# Patient Record
Sex: Male | Born: 1950 | Race: Black or African American | Hispanic: No | Marital: Married | Smoking: Never smoker
Health system: Southern US, Community
[De-identification: ages and names within clinical notes are randomized; demographics above are authoritative.]

## PROBLEM LIST (undated history)

## (undated) DIAGNOSIS — E559 Vitamin D deficiency, unspecified: Secondary | ICD-10-CM

## (undated) DIAGNOSIS — K219 Gastro-esophageal reflux disease without esophagitis: Secondary | ICD-10-CM

## (undated) DIAGNOSIS — R269 Unspecified abnormalities of gait and mobility: Secondary | ICD-10-CM

## (undated) DIAGNOSIS — G629 Polyneuropathy, unspecified: Secondary | ICD-10-CM

## (undated) DIAGNOSIS — F329 Major depressive disorder, single episode, unspecified: Secondary | ICD-10-CM

## (undated) DIAGNOSIS — L603 Nail dystrophy: Secondary | ICD-10-CM

## (undated) DIAGNOSIS — M1990 Unspecified osteoarthritis, unspecified site: Secondary | ICD-10-CM

## (undated) DIAGNOSIS — R29898 Other symptoms and signs involving the musculoskeletal system: Secondary | ICD-10-CM

## (undated) DIAGNOSIS — E039 Hypothyroidism, unspecified: Secondary | ICD-10-CM

## (undated) DIAGNOSIS — G5601 Carpal tunnel syndrome, right upper limb: Secondary | ICD-10-CM

## (undated) DIAGNOSIS — M5030 Other cervical disc degeneration, unspecified cervical region: Secondary | ICD-10-CM

## (undated) DIAGNOSIS — J449 Chronic obstructive pulmonary disease, unspecified: Secondary | ICD-10-CM

## (undated) DIAGNOSIS — M25541 Pain in joints of right hand: Secondary | ICD-10-CM

## (undated) DIAGNOSIS — I2510 Atherosclerotic heart disease of native coronary artery without angina pectoris: Secondary | ICD-10-CM

## (undated) DIAGNOSIS — E237 Disorder of pituitary gland, unspecified: Secondary | ICD-10-CM

## (undated) DIAGNOSIS — E785 Hyperlipidemia, unspecified: Secondary | ICD-10-CM

## (undated) DIAGNOSIS — G4730 Sleep apnea, unspecified: Secondary | ICD-10-CM

## (undated) DIAGNOSIS — Q761 Klippel-Feil syndrome: Secondary | ICD-10-CM

## (undated) DIAGNOSIS — T8859XA Other complications of anesthesia, initial encounter: Secondary | ICD-10-CM

## (undated) DIAGNOSIS — Z96659 Presence of unspecified artificial knee joint: Secondary | ICD-10-CM

## (undated) DIAGNOSIS — Z973 Presence of spectacles and contact lenses: Secondary | ICD-10-CM

## (undated) DIAGNOSIS — R1310 Dysphagia, unspecified: Secondary | ICD-10-CM

## (undated) DIAGNOSIS — I872 Venous insufficiency (chronic) (peripheral): Secondary | ICD-10-CM

## (undated) DIAGNOSIS — R0609 Other forms of dyspnea: Secondary | ICD-10-CM

## (undated) DIAGNOSIS — F32A Depression, unspecified: Secondary | ICD-10-CM

## (undated) DIAGNOSIS — F99 Mental disorder, not otherwise specified: Secondary | ICD-10-CM

## (undated) DIAGNOSIS — K4090 Unilateral inguinal hernia, without obstruction or gangrene, not specified as recurrent: Secondary | ICD-10-CM

## (undated) DIAGNOSIS — G4733 Obstructive sleep apnea (adult) (pediatric): Secondary | ICD-10-CM

## (undated) DIAGNOSIS — M47816 Spondylosis without myelopathy or radiculopathy, lumbar region: Secondary | ICD-10-CM

## (undated) DIAGNOSIS — I10 Essential (primary) hypertension: Secondary | ICD-10-CM

## (undated) DIAGNOSIS — F419 Anxiety disorder, unspecified: Secondary | ICD-10-CM

## (undated) DIAGNOSIS — F4310 Post-traumatic stress disorder, unspecified: Secondary | ICD-10-CM

## (undated) DIAGNOSIS — F411 Generalized anxiety disorder: Secondary | ICD-10-CM

## (undated) DIAGNOSIS — R9431 Abnormal electrocardiogram [ECG] [EKG]: Secondary | ICD-10-CM

## (undated) DIAGNOSIS — J309 Allergic rhinitis, unspecified: Secondary | ICD-10-CM

## (undated) DIAGNOSIS — N189 Chronic kidney disease, unspecified: Secondary | ICD-10-CM

## (undated) HISTORY — DX: Vitamin D deficiency, unspecified: E55.9

## (undated) HISTORY — DX: Unspecified osteoarthritis, unspecified site: M19.90

## (undated) HISTORY — DX: Essential (primary) hypertension: I10

## (undated) HISTORY — DX: Other cervical disc degeneration, unspecified cervical region: M50.30

## (undated) HISTORY — DX: Chronic obstructive pulmonary disease, unspecified: J44.9

## (undated) HISTORY — DX: Generalized anxiety disorder: F41.1

## (undated) HISTORY — PX: SHOULDER SURGERY: SHX246

## (undated) HISTORY — DX: Nail dystrophy: L60.3

## (undated) HISTORY — DX: Klippel-Feil syndrome: Q76.1

## (undated) HISTORY — DX: Hyperlipidemia, unspecified: E78.5

## (undated) HISTORY — DX: Pain in joints of right hand: M25.541

## (undated) HISTORY — DX: Major depressive disorder, single episode, unspecified: F32.9

## (undated) HISTORY — DX: Allergic rhinitis, unspecified: J30.9

## (undated) HISTORY — PX: HIP ARTHROPLASTY: SHX981

## (undated) HISTORY — PX: UPPER GI ENDOSCOPY: SHX6162

## (undated) HISTORY — DX: Spondylosis without myelopathy or radiculopathy, lumbar region: M47.816

## (undated) HISTORY — PX: COLONOSCOPY: SHX174

## (undated) HISTORY — PX: CARDIAC CATHETERIZATION: SHX172

## (undated) HISTORY — DX: Abnormal electrocardiogram (ECG) (EKG): R94.31

## (undated) HISTORY — PX: CERVICAL DISC SURGERY: SHX588

## (undated) HISTORY — DX: Other forms of dyspnea: R06.09

## (undated) HISTORY — PX: KNEE ARTHROPLASTY: SHX992

## (undated) HISTORY — DX: Unspecified abnormalities of gait and mobility: R26.9

## (undated) HISTORY — PX: JOINT REPLACEMENT: SHX530

## (undated) HISTORY — DX: Presence of unspecified artificial knee joint: Z96.659

## (undated) HISTORY — DX: Carpal tunnel syndrome, right upper limb: G56.01

## (undated) HISTORY — DX: Venous insufficiency (chronic) (peripheral): I87.2

---

## 1995-08-17 DIAGNOSIS — F819 Developmental disorder of scholastic skills, unspecified: Secondary | ICD-10-CM

## 1995-08-17 HISTORY — DX: Developmental disorder of scholastic skills, unspecified: F81.9

## 1997-12-16 ENCOUNTER — Ambulatory Visit (HOSPITAL_COMMUNITY): Admission: RE | Admit: 1997-12-16 | Discharge: 1997-12-16 | Payer: Self-pay | Admitting: Internal Medicine

## 1999-03-03 ENCOUNTER — Ambulatory Visit (HOSPITAL_BASED_OUTPATIENT_CLINIC_OR_DEPARTMENT_OTHER): Admission: RE | Admit: 1999-03-03 | Discharge: 1999-03-03 | Payer: Self-pay

## 1999-05-05 ENCOUNTER — Ambulatory Visit (HOSPITAL_BASED_OUTPATIENT_CLINIC_OR_DEPARTMENT_OTHER): Admission: RE | Admit: 1999-05-05 | Discharge: 1999-05-05 | Payer: Self-pay | Admitting: Orthopedic Surgery

## 1999-11-30 ENCOUNTER — Encounter: Payer: Self-pay | Admitting: Orthopedic Surgery

## 1999-12-01 ENCOUNTER — Inpatient Hospital Stay (HOSPITAL_COMMUNITY): Admission: RE | Admit: 1999-12-01 | Discharge: 1999-12-04 | Payer: Self-pay | Admitting: Orthopedic Surgery

## 2000-01-01 ENCOUNTER — Encounter: Admission: RE | Admit: 2000-01-01 | Discharge: 2000-01-28 | Payer: Self-pay | Admitting: Orthopedic Surgery

## 2000-01-15 ENCOUNTER — Ambulatory Visit: Admission: RE | Admit: 2000-01-15 | Discharge: 2000-01-15 | Payer: Self-pay | Admitting: Internal Medicine

## 2000-08-04 ENCOUNTER — Inpatient Hospital Stay (HOSPITAL_COMMUNITY): Admission: AD | Admit: 2000-08-04 | Discharge: 2000-08-07 | Payer: Self-pay | Admitting: Internal Medicine

## 2000-08-06 ENCOUNTER — Encounter: Payer: Self-pay | Admitting: Internal Medicine

## 2000-12-06 ENCOUNTER — Encounter: Admission: RE | Admit: 2000-12-06 | Discharge: 2000-12-06 | Payer: Self-pay | Admitting: Internal Medicine

## 2000-12-06 ENCOUNTER — Encounter: Payer: Self-pay | Admitting: Internal Medicine

## 2002-07-27 ENCOUNTER — Encounter: Payer: Self-pay | Admitting: Internal Medicine

## 2002-07-27 ENCOUNTER — Encounter: Admission: RE | Admit: 2002-07-27 | Discharge: 2002-07-27 | Payer: Self-pay | Admitting: Internal Medicine

## 2002-10-08 ENCOUNTER — Encounter: Payer: Self-pay | Admitting: Internal Medicine

## 2002-10-08 ENCOUNTER — Encounter: Admission: RE | Admit: 2002-10-08 | Discharge: 2002-10-08 | Payer: Self-pay | Admitting: Internal Medicine

## 2003-09-18 ENCOUNTER — Inpatient Hospital Stay (HOSPITAL_COMMUNITY): Admission: AD | Admit: 2003-09-18 | Discharge: 2003-09-19 | Payer: Self-pay | Admitting: Internal Medicine

## 2003-10-09 ENCOUNTER — Encounter: Admission: RE | Admit: 2003-10-09 | Discharge: 2003-10-09 | Payer: Self-pay | Admitting: Internal Medicine

## 2003-10-09 ENCOUNTER — Emergency Department (HOSPITAL_COMMUNITY): Admission: EM | Admit: 2003-10-09 | Discharge: 2003-10-09 | Payer: Self-pay | Admitting: Emergency Medicine

## 2003-10-10 ENCOUNTER — Ambulatory Visit (HOSPITAL_BASED_OUTPATIENT_CLINIC_OR_DEPARTMENT_OTHER): Admission: RE | Admit: 2003-10-10 | Discharge: 2003-10-10 | Payer: Self-pay | Admitting: Internal Medicine

## 2003-12-12 ENCOUNTER — Ambulatory Visit (HOSPITAL_COMMUNITY): Admission: RE | Admit: 2003-12-12 | Discharge: 2003-12-13 | Payer: Self-pay | Admitting: Neurosurgery

## 2004-08-16 HISTORY — PX: CARDIAC CATHETERIZATION: SHX172

## 2004-09-25 ENCOUNTER — Inpatient Hospital Stay (HOSPITAL_COMMUNITY): Admission: EM | Admit: 2004-09-25 | Discharge: 2004-09-29 | Payer: Self-pay | Admitting: Emergency Medicine

## 2004-09-28 ENCOUNTER — Encounter (INDEPENDENT_AMBULATORY_CARE_PROVIDER_SITE_OTHER): Payer: Self-pay | Admitting: Cardiology

## 2004-10-10 ENCOUNTER — Emergency Department (HOSPITAL_COMMUNITY): Admission: EM | Admit: 2004-10-10 | Discharge: 2004-10-10 | Payer: Self-pay | Admitting: Emergency Medicine

## 2004-10-14 ENCOUNTER — Inpatient Hospital Stay (HOSPITAL_COMMUNITY): Admission: AD | Admit: 2004-10-14 | Discharge: 2004-10-16 | Payer: Self-pay | Admitting: Internal Medicine

## 2004-10-26 ENCOUNTER — Inpatient Hospital Stay (HOSPITAL_COMMUNITY): Admission: RE | Admit: 2004-10-26 | Discharge: 2004-10-27 | Payer: Self-pay | Admitting: Neurosurgery

## 2005-04-01 ENCOUNTER — Emergency Department (HOSPITAL_COMMUNITY): Admission: EM | Admit: 2005-04-01 | Discharge: 2005-04-02 | Payer: Self-pay | Admitting: Emergency Medicine

## 2006-02-28 ENCOUNTER — Encounter: Payer: Self-pay | Admitting: Pulmonary Disease

## 2006-03-27 IMAGING — CT CT HEAD W/O CM
1 of 2 series · 13 of 30 positions shown, 17 images · IV contrast (agent unspecified)
Comparison: 09/18/03.

CLINICAL DATA: Syncope. Patient fell, but reportedly did not strike his head.
 CT HEAD WITHOUT CONTRAST:
 Routine unenhanced study was performed.

[Series 2: brain · axial · 0.47mm/px · z∈[+159,+283]mm · 13 of 28 slices shown, 17 images]
[im 2/28  brain]
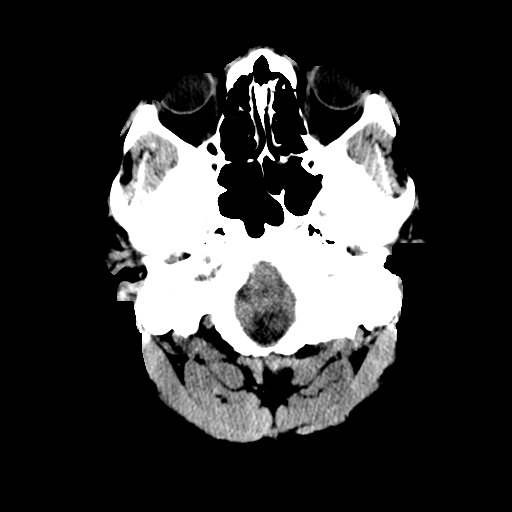
[im 2/28  bone]
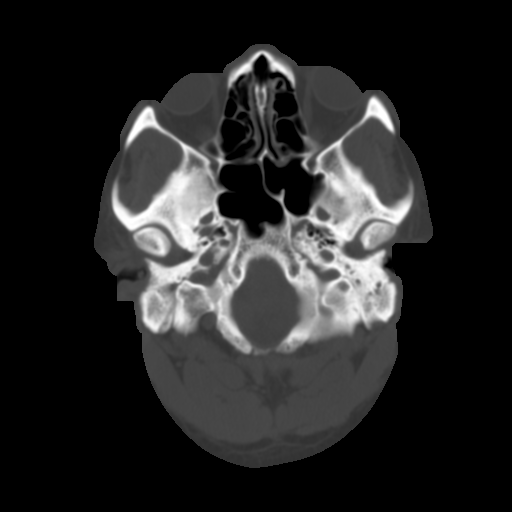
[im 4/28  brain]
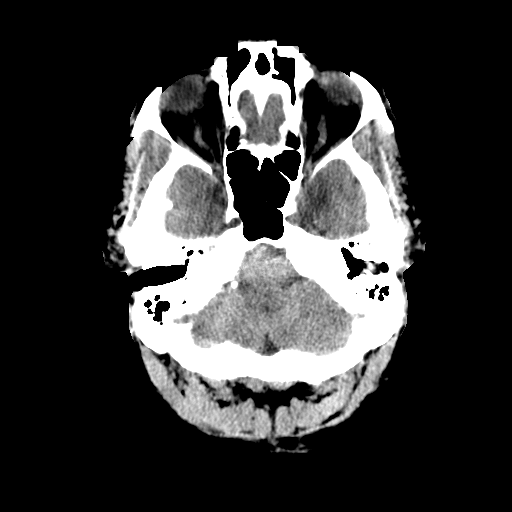
[im 6/28  brain]
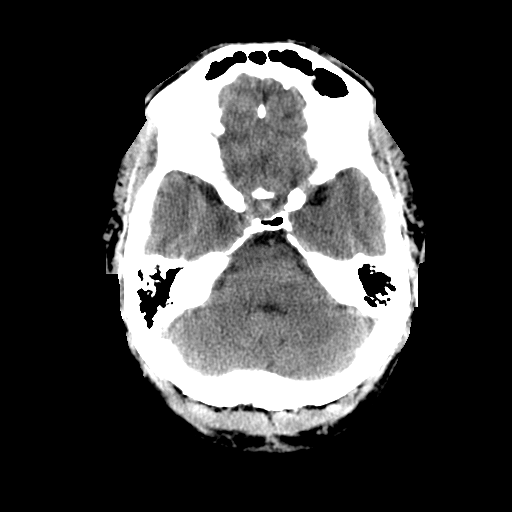
[im 8/28  brain]
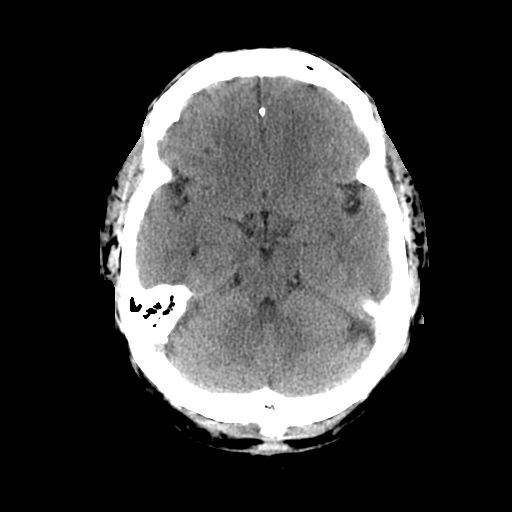
[im 10/28  brain]
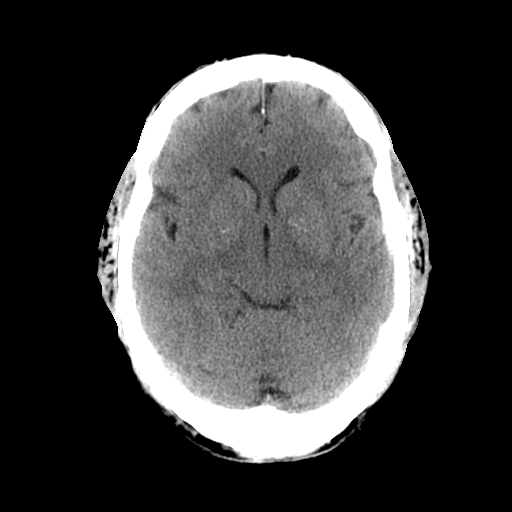
[im 10/28  bone]
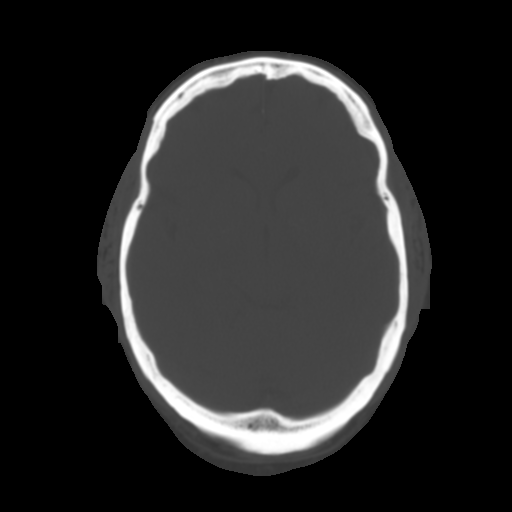
[im 12/28  brain]
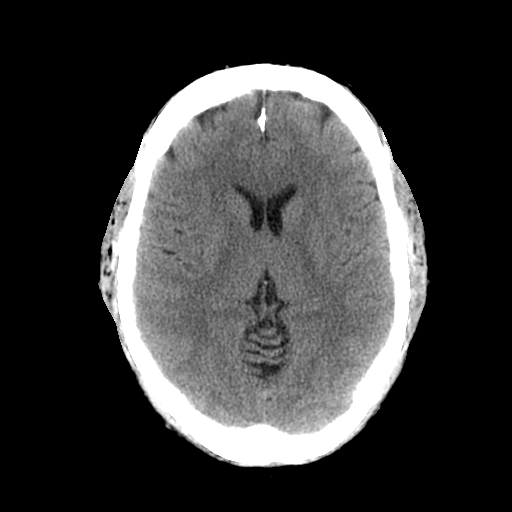
[im 14/28  brain]
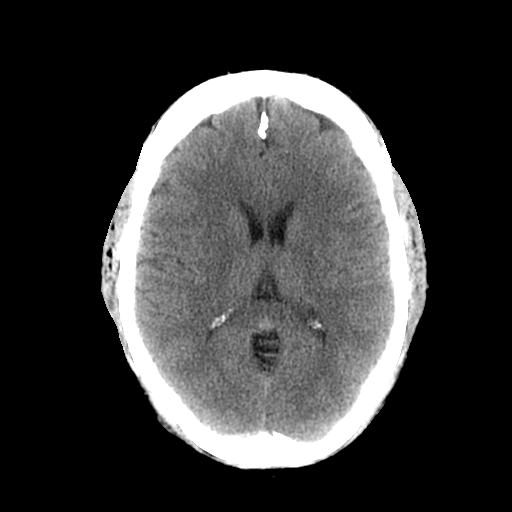
[im 16/28  brain]
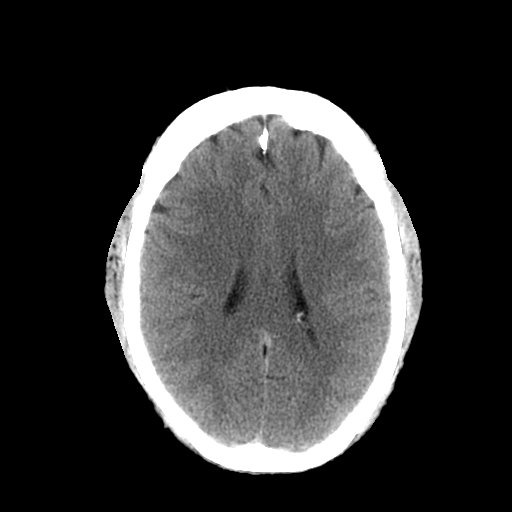
[im 18/28  brain]
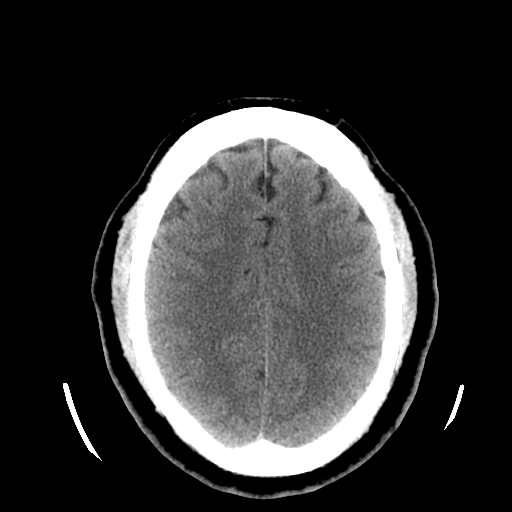
[im 18/28  bone]
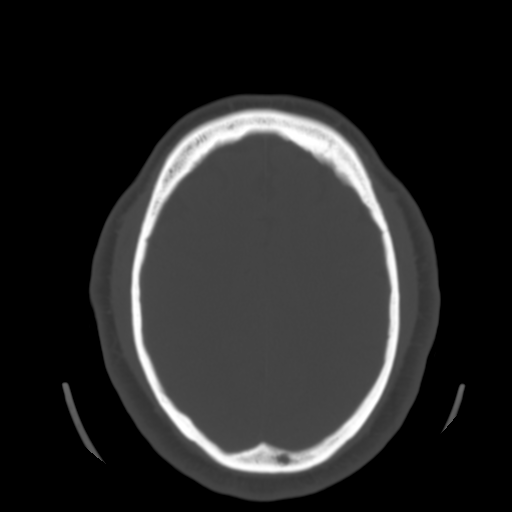
[im 20/28  brain]
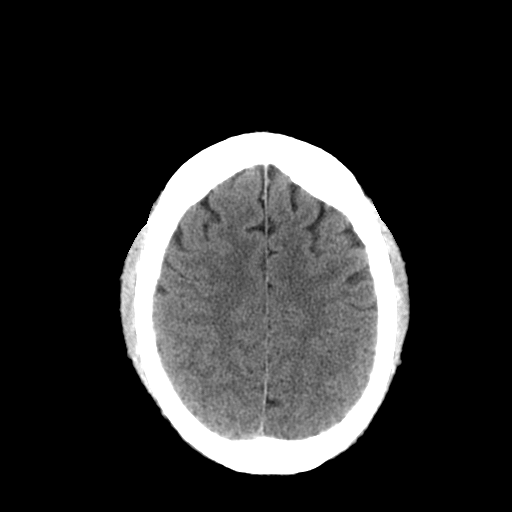
[im 22/28  brain]
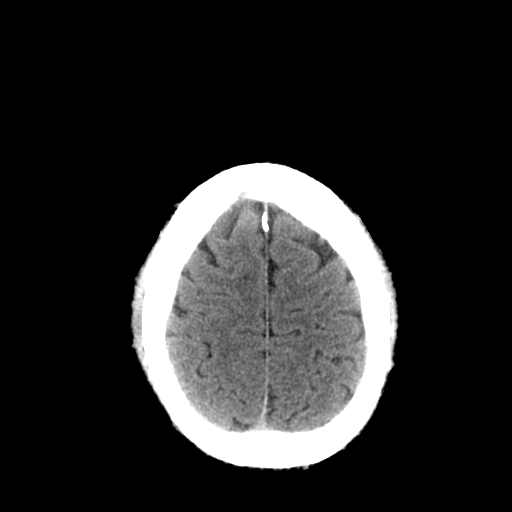
[im 24/28  brain]
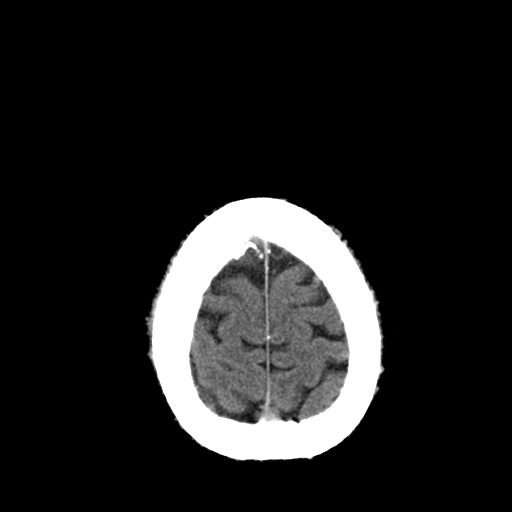
[im 26/28  brain]
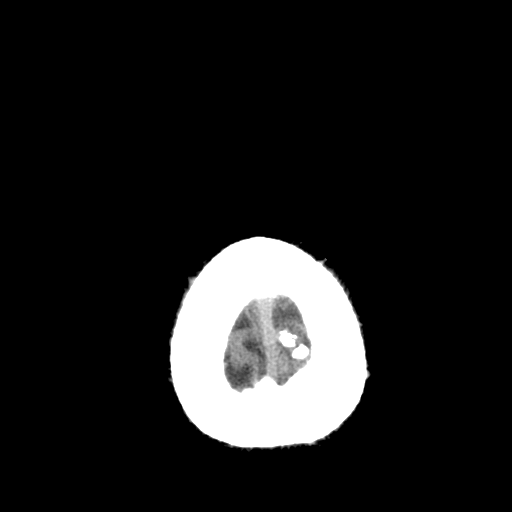
[im 26/28  bone]
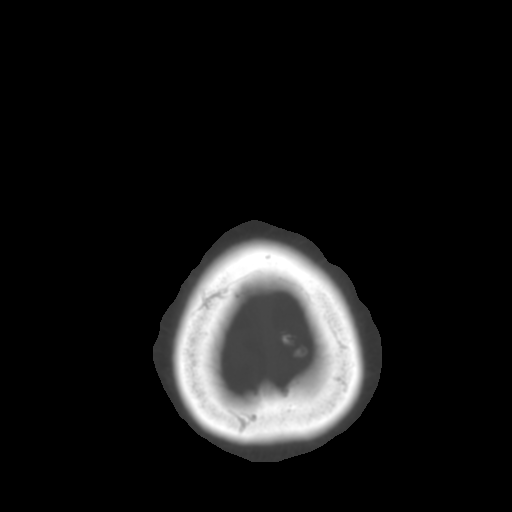

[13 of 30 positions shown; findings below may reference images not displayed]

There is no evidence of acute intracranial hemorrhage, mass effect, or extra-axial fluid collection. The ventricles and subarachnoid spaces remain appropriately sized for age.  Scattered calvarial lucencies are stable.  The visualized paranasal sinuses are clear. Soft tissue density in the left external auditory canal is similar to that seen previously and probably due to cerumen.
IMPRESSION: Stable exam. No acute intracranial findings.

## 2006-06-20 ENCOUNTER — Encounter: Payer: Self-pay | Admitting: Pulmonary Disease

## 2007-10-02 ENCOUNTER — Emergency Department (HOSPITAL_COMMUNITY): Admission: EM | Admit: 2007-10-02 | Discharge: 2007-10-03 | Payer: Self-pay | Admitting: Emergency Medicine

## 2007-10-13 ENCOUNTER — Emergency Department (HOSPITAL_COMMUNITY): Admission: EM | Admit: 2007-10-13 | Discharge: 2007-10-13 | Payer: Self-pay | Admitting: Family Medicine

## 2007-11-10 ENCOUNTER — Encounter: Admission: RE | Admit: 2007-11-10 | Discharge: 2007-11-10 | Payer: Self-pay | Admitting: Orthopedic Surgery

## 2008-08-06 ENCOUNTER — Inpatient Hospital Stay (HOSPITAL_COMMUNITY): Admission: RE | Admit: 2008-08-06 | Discharge: 2008-08-09 | Payer: Self-pay | Admitting: Orthopedic Surgery

## 2010-07-18 ENCOUNTER — Emergency Department (HOSPITAL_COMMUNITY)
Admission: EM | Admit: 2010-07-18 | Discharge: 2010-07-18 | Payer: Self-pay | Source: Home / Self Care | Admitting: Emergency Medicine

## 2010-07-18 ENCOUNTER — Emergency Department (HOSPITAL_COMMUNITY)
Admission: EM | Admit: 2010-07-18 | Discharge: 2010-07-18 | Disposition: A | Discharge status: Other Acute Inpatient Hospital | Payer: Self-pay | Source: Home / Self Care | Admitting: Emergency Medicine

## 2010-10-23 ENCOUNTER — Other Ambulatory Visit: Payer: Self-pay | Admitting: Orthopedic Surgery

## 2010-10-23 ENCOUNTER — Other Ambulatory Visit (HOSPITAL_COMMUNITY): Payer: Self-pay | Admitting: Orthopedic Surgery

## 2010-10-23 ENCOUNTER — Encounter (HOSPITAL_COMMUNITY): Payer: Medicare Other

## 2010-10-23 ENCOUNTER — Ambulatory Visit (HOSPITAL_COMMUNITY)
Admission: RE | Admit: 2010-10-23 | Discharge: 2010-10-23 | Disposition: A | Discharge status: Home / Self Care | Payer: Medicare Other | Source: Ambulatory Visit | Attending: Orthopedic Surgery | Admitting: Orthopedic Surgery

## 2010-10-23 DIAGNOSIS — Z01818 Encounter for other preprocedural examination: Secondary | ICD-10-CM | POA: Insufficient documentation

## 2010-10-23 DIAGNOSIS — M1710 Unilateral primary osteoarthritis, unspecified knee: Secondary | ICD-10-CM | POA: Insufficient documentation

## 2010-10-23 DIAGNOSIS — M47814 Spondylosis without myelopathy or radiculopathy, thoracic region: Secondary | ICD-10-CM | POA: Insufficient documentation

## 2010-10-23 DIAGNOSIS — Z01811 Encounter for preprocedural respiratory examination: Secondary | ICD-10-CM | POA: Insufficient documentation

## 2010-10-23 DIAGNOSIS — Z01812 Encounter for preprocedural laboratory examination: Secondary | ICD-10-CM | POA: Insufficient documentation

## 2010-10-23 LAB — DIFFERENTIAL
Basophils Absolute: 0 10*3/uL (ref 0.0–0.1)
Lymphocytes Relative: 32 % (ref 12–46)
Lymphs Abs: 1.9 10*3/uL (ref 0.7–4.0)
Monocytes Absolute: 0.6 10*3/uL (ref 0.1–1.0)
Neutro Abs: 3.2 10*3/uL (ref 1.7–7.7)

## 2010-10-23 LAB — URINALYSIS, ROUTINE W REFLEX MICROSCOPIC
Glucose, UA: NEGATIVE mg/dL
Hgb urine dipstick: NEGATIVE
Specific Gravity, Urine: 1.015 (ref 1.005–1.030)
pH: 6.5 (ref 5.0–8.0)

## 2010-10-23 LAB — CBC
HCT: 41.7 % (ref 39.0–52.0)
Hemoglobin: 13 g/dL (ref 13.0–17.0)
MCV: 74.3 fL — ABNORMAL LOW (ref 78.0–100.0)
RBC: 5.61 MIL/uL (ref 4.22–5.81)
RDW: 15.3 % (ref 11.5–15.5)
WBC: 5.9 10*3/uL (ref 4.0–10.5)

## 2010-10-23 LAB — COMPREHENSIVE METABOLIC PANEL
ALT: 21 U/L (ref 0–53)
AST: 25 U/L (ref 0–37)
Albumin: 3.9 g/dL (ref 3.5–5.2)
Calcium: 9.5 mg/dL (ref 8.4–10.5)
Chloride: 101 mEq/L (ref 96–112)
Creatinine, Ser: 1.22 mg/dL (ref 0.4–1.5)
GFR calc Af Amer: >60 mL/min (ref >60)
Sodium: 138 mEq/L (ref 135–145)
Total Bilirubin: 0.8 mg/dL (ref 0.3–1.2)

## 2010-10-23 LAB — APTT: aPTT: 29 seconds (ref 24–37)

## 2010-10-23 LAB — SURGICAL PCR SCREEN
MRSA, PCR: NEGATIVE
Staphylococcus aureus: POSITIVE — AB

## 2010-10-26 LAB — POCT I-STAT, CHEM 8
BUN: 17 mg/dL (ref 6–23)
Calcium, Ion: 1.2 mmol/L (ref 1.12–1.32)
HCT: 46 % (ref 39.0–52.0)
TCO2: 30 mmol/L (ref 0–100)

## 2010-10-26 LAB — CBC
MCV: 75 fL — ABNORMAL LOW (ref 78.0–100.0)
Platelets: 262 10*3/uL (ref 150–400)
RDW: 16 % — ABNORMAL HIGH (ref 11.5–15.5)
WBC: 7.5 10*3/uL (ref 4.0–10.5)

## 2010-10-26 LAB — DIFFERENTIAL
Basophils Absolute: 0 10*3/uL (ref 0.0–0.1)
Basophils Relative: 0 % (ref 0–1)
Eosinophils Absolute: 0.1 10*3/uL (ref 0.0–0.7)
Eosinophils Relative: 1 % (ref 0–5)
Lymphocytes Relative: 23 % (ref 12–46)

## 2010-10-29 ENCOUNTER — Inpatient Hospital Stay (HOSPITAL_COMMUNITY)
Admission: RE | Admit: 2010-10-29 | Discharge: 2010-11-02 | DRG: 470 | Disposition: A | Discharge status: Skilled Nursing Facility | Payer: Medicare Other | Source: Ambulatory Visit | Attending: Orthopedic Surgery | Admitting: Orthopedic Surgery

## 2010-10-29 DIAGNOSIS — D352 Benign neoplasm of pituitary gland: Secondary | ICD-10-CM | POA: Diagnosis present

## 2010-10-29 DIAGNOSIS — J9819 Other pulmonary collapse: Secondary | ICD-10-CM | POA: Diagnosis not present

## 2010-10-29 DIAGNOSIS — I10 Essential (primary) hypertension: Secondary | ICD-10-CM | POA: Diagnosis present

## 2010-10-29 DIAGNOSIS — D62 Acute posthemorrhagic anemia: Secondary | ICD-10-CM | POA: Diagnosis not present

## 2010-10-29 DIAGNOSIS — E7800 Pure hypercholesterolemia, unspecified: Secondary | ICD-10-CM | POA: Diagnosis present

## 2010-10-29 DIAGNOSIS — K5900 Constipation, unspecified: Secondary | ICD-10-CM | POA: Diagnosis not present

## 2010-10-29 DIAGNOSIS — E119 Type 2 diabetes mellitus without complications: Secondary | ICD-10-CM | POA: Diagnosis present

## 2010-10-29 DIAGNOSIS — G4733 Obstructive sleep apnea (adult) (pediatric): Secondary | ICD-10-CM | POA: Diagnosis present

## 2010-10-29 DIAGNOSIS — G47419 Narcolepsy without cataplexy: Secondary | ICD-10-CM | POA: Diagnosis present

## 2010-10-29 DIAGNOSIS — Z96659 Presence of unspecified artificial knee joint: Secondary | ICD-10-CM

## 2010-10-29 DIAGNOSIS — E039 Hypothyroidism, unspecified: Secondary | ICD-10-CM | POA: Diagnosis present

## 2010-10-29 DIAGNOSIS — F341 Dysthymic disorder: Secondary | ICD-10-CM | POA: Diagnosis present

## 2010-10-29 DIAGNOSIS — E876 Hypokalemia: Secondary | ICD-10-CM | POA: Diagnosis not present

## 2010-10-29 DIAGNOSIS — Z96649 Presence of unspecified artificial hip joint: Secondary | ICD-10-CM

## 2010-10-29 DIAGNOSIS — M1710 Unilateral primary osteoarthritis, unspecified knee: Principal | ICD-10-CM | POA: Diagnosis present

## 2010-10-29 LAB — GLUCOSE, CAPILLARY
Glucose-Capillary: 121 mg/dL — ABNORMAL HIGH (ref 70–99)
Glucose-Capillary: 126 mg/dL — ABNORMAL HIGH (ref 70–99)
Glucose-Capillary: 43 mg/dL — CL (ref 70–99)

## 2010-10-29 LAB — ABO/RH: ABO/RH(D): O POS

## 2010-10-29 LAB — CROSSMATCH

## 2010-10-30 LAB — CBC
HCT: 33.9 % — ABNORMAL LOW (ref 39.0–52.0)
Hemoglobin: 10.6 g/dL — ABNORMAL LOW (ref 13.0–17.0)
MCH: 23.6 pg — ABNORMAL LOW (ref 26.0–34.0)
MCHC: 31.3 g/dL (ref 30.0–36.0)
RBC: 4.5 MIL/uL (ref 4.22–5.81)

## 2010-10-30 LAB — BASIC METABOLIC PANEL
CO2: 29 mEq/L (ref 19–32)
Calcium: 8.3 mg/dL — ABNORMAL LOW (ref 8.4–10.5)
Chloride: 102 mEq/L (ref 96–112)
Creatinine, Ser: 1.21 mg/dL (ref 0.4–1.5)
GFR calc Af Amer: >60 mL/min (ref >60)
Glucose, Bld: 124 mg/dL — ABNORMAL HIGH (ref 70–99)

## 2010-10-30 LAB — GLUCOSE, CAPILLARY
Glucose-Capillary: 93 mg/dL (ref 70–99)
Glucose-Capillary: 115 mg/dL — ABNORMAL HIGH (ref 70–99)

## 2010-10-31 LAB — BASIC METABOLIC PANEL
CO2: 28 mEq/L (ref 19–32)
Chloride: 103 mEq/L (ref 96–112)
GFR calc Af Amer: >60 mL/min (ref >60)
Glucose, Bld: 109 mg/dL — ABNORMAL HIGH (ref 70–99)
Sodium: 137 mEq/L (ref 135–145)

## 2010-10-31 LAB — GLUCOSE, CAPILLARY
Glucose-Capillary: 87 mg/dL (ref 70–99)
Glucose-Capillary: 84 mg/dL (ref 70–99)
Glucose-Capillary: 104 mg/dL — ABNORMAL HIGH (ref 70–99)

## 2010-10-31 LAB — CBC
HCT: 31.9 % — ABNORMAL LOW (ref 39.0–52.0)
Hemoglobin: 9.9 g/dL — ABNORMAL LOW (ref 13.0–17.0)
RBC: 4.27 MIL/uL (ref 4.22–5.81)

## 2010-11-01 LAB — GLUCOSE, CAPILLARY
Glucose-Capillary: 120 mg/dL — ABNORMAL HIGH (ref 70–99)
Glucose-Capillary: 93 mg/dL (ref 70–99)
Glucose-Capillary: 112 mg/dL — ABNORMAL HIGH (ref 70–99)

## 2010-11-01 LAB — CBC
MCHC: 30.9 g/dL (ref 30.0–36.0)
Platelets: 212 10*3/uL (ref 150–400)
RDW: 15.1 % (ref 11.5–15.5)
WBC: 9.5 10*3/uL (ref 4.0–10.5)

## 2010-11-01 LAB — BASIC METABOLIC PANEL
Calcium: 8.5 mg/dL (ref 8.4–10.5)
GFR calc Af Amer: >60 mL/min (ref >60)
GFR calc non Af Amer: >60 mL/min (ref >60)
Glucose, Bld: 103 mg/dL — ABNORMAL HIGH (ref 70–99)
Sodium: 137 mEq/L (ref 135–145)

## 2010-11-17 NOTE — Op Note (Signed)
[name], [name]              ACCOUNT NO.:  [id number]  MEDICAL RECORD NO.:  [id number]           PATIENT TYPE:  I  LOCATION:  0004                         FACILITY:  [facility]  PHYSICIAN:  Demiya Magno L. Rendall, M.D.  DATE OF BIRTH:  [birth year]  DATE OF PROCEDURE:  10/29/2010 DATE OF DISCHARGE:                              OPERATIVE REPORT   PREOPERATIVE DIAGNOSIS:  Osteoarthritis, right knee with angular deformity.  POSTOPERATIVE DIAGNOSIS:  Osteoarthritis, right knee with angular deformity.  SURGICAL PROCEDURES:  Right low contact stress total knee arthroplasty with computer navigation assistance.  SURGEON:  Tywone Bembenek L. Rendall, MD  ASSISTANT:  Legrand Pitts. Duffy, PAC - present and participating in entire procedure.  ANESTHESIA:  General with femoral nerve block.  PATHOLOGY:  The patient has fixed varus deformity of 10 degrees, 12 degrees fixed flexion contracture.  The knee has gross crepitants.  It has end-stage arthritis, worse medially with at least 8 loose bodies that were removed from the knee.  PROCEDURE:  Under general anesthesia with the femoral nerve block, the left leg was prepared with DuraPrep and draped as a sterile field, and sterile tourniquet is applied on the proximal thigh.  Legs wrapped out with an Esmarch at 350 mm.  Time-out was done of course, and he received his preoperative antibiotics.  Midline incision was made.  The patella was everted.  A large suprapatellar loose body 1-1/2 x 1 x 1 inches is removed.  A gigantic spur off the superior patella is osteotomized with a bone saw removing nearly that big bone spur again.  With these out of the way, attention was turned to the joint.  It is in sorry shape with bare bone medially and chronic synovitis.  A medial release is done in preparation for debridement and mapping.  Once debridement is completed in preparation for mapping, 2 Schanz pins were placed through superior medial tibial punctures and distal  femoral punctures, the arrays were set up.  The knee was then appropriately mapped within less than 1 mm of reproducible accuracy.  Proximal tibia was then resected taking only 2-3 mm from the low medial side.  In doing this, extensive releases and spurs were taken down around the medial side.  The tensioner was then inserted and we went from 10 degrees varus to 5 or 6.  After several more tries, we got it down to about 2 and could hyperextend to valgus deformity.  With near balancing deformity, I went ahead to measure the flexion gap.  It was necessary to remove about 3 to 4 of loose bodies in the popliteal recess before we could even get a good measurement of the flexion gap.  Once this was done, the first femoral guide was used in the anterior and posterior flare of the femoral condyles were resected within 1 degree of anatomic rotation.  The distal femoral cut was then made with an 1 degree of anatomic alignment.  The flexion and extension gaps were measured and were approximately 10 degrees each, slightly tighter medially.  At this point, the lamina spreader was inserted and remnants of menisci and cruciates were removed along  with spurs on the back of the femoral condyle.  In the process of removing spurs there, 2 or 3 loose bodies were removed from either gutter in the back.  This allowed considerable mobility of the knee.  It was noticed at about this point that the patient also had a loose ossicle in the patellar tendon just above the tibial tubercle.  This also was resected.  Recessing guide was then used.  Proximal tibia was exposed.  It was sized to a #4. Due to the gentleman's size and the fact that he had total knee on the opposite side that is already showing signs of early lucency, decision was to go with the tibial MBT revision tray size 5.  This made a good fit in the tibia, 10 bearing was inserted.  This showed slight laxity with hyperextension, and a 12/5 bearing was an  excellent fit.  With the 12/5 bearing in and the femoral component large and the patellar component also large, the knee was within 1 degree of anatomic alignment and full extension.  This was a tremendous improvement.  The knee was stable to varus, valgus, and drawer testing.  Bony surfaces were then prepared with pulse irrigation.  All components cemented in place. Tourniquet was let down at 89 minutes after cement hardened.  Multiple small vessels were cauterized.  A synovectomy was done of course while the cement was hardening.  A medium Hemovac was inserted.  The knee was then closed in layers with #1 Tycron, #1 Vicryl, 2-0 Vicryl and skin clips.  The patient tolerated the procedure well and returned to recovery in good condition.     Farren Nelles L. Priscille Kluver, M.D.     Renato Gails  D:  10/29/2010  T:  10/29/2010  Job:  [id number]  Electronically Signed by Erasmo Leventhal M.D. on 11/17/2010 01:42:53 PM

## 2010-11-17 NOTE — Discharge Summary (Signed)
[name], [name]              ACCOUNT NO.:  [id number]  MEDICAL RECORD NO.:  [id number]           PATIENT TYPE:  I  LOCATION:  1608                         FACILITY:  [facility]  PHYSICIAN:  Wilma Michaelson L. Rendall, M.D.  DATE OF BIRTH:  December 12, 1950  DATE OF ADMISSION:  10/29/2010 DATE OF DISCHARGE:  11/02/2010                              DISCHARGE SUMMARY   ADMISSION DIAGNOSES: 1. End-stage osteoarthritis, right knee. 2. 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 3. Hypertension. 4. Type 2 diabetes mellitus. 5. Narcolepsy. 6. Sleep apnea. 7. Hypothyroidism. 8. Anxiety/depression. 9. Hypercholesterolemia. 10.Pituitary adenoma. 11.Seasonal allergies.  DISCHARGE DIAGNOSES: 1. End-stage osteoarthritis, right knee, status post right total knee     arthroplasty. 2. Acute blood loss anemia secondary to surgery. 3. Constipation. 4. 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 5. Hypertension. 6. Type 2 diabetes mellitus. 7. Narcolepsy. 8. Sleep apnea. 9. Hypothyroidism. 10.Anxiety/depression. 11.Hypercholesterolemia. 12.Pituitary adenoma. 13.Seasonal allergies.  SURGICAL PROCEDURES:  On October 29, 2010, [name] underwent a right total knee arthroplasty with computer navigation by Dr. Jonny Ruiz L. Rendall, assisted by Arnoldo Morale, PA-C.  He had an LCS complete metal back patella cemented size large placed with a tibial tray MBT revision size 5 cemented.  A permanent femoral cemented component large right with an LCS complete RP insert size large 12.5 mm thickness.  COMPLICATIONS:  None.  CONSULTS: 1. Respiratory therapy, consult March 15. 2. Case management physical therapy consult, March 16.  HISTORY OF PRESENT ILLNESS:  This 60 year old black male patient presented with Dr. Priscille Kluver with history of a left total knee in 1998 and right total hip in 2010 by Dr. Chaney Malling with a 10- to 12-year history of gradual onset progressive right knee pain.  The right knee pain  is now intermittent, sharp to dull to ache sensation, diffuse about the knee with some radiation up and down the leg.  Nothing makes it worse and decreased with Mobic, the knee pops, locks, swells, and keeps him up at night.  He failed conservative treatment and because of that, he is presenting for right knee replacement.  HOSPITAL COURSE:  [name] tolerated his surgical procedure well without immediate postoperative complications.  He is transferred to the orthopedic floor.  Postop day #1, T-max was 100.3, hemoglobin 10.6, hematocrit 33.9.  His pain is not well controlled and we switched him to p.o. meds with additional OxyIR if needed and plans were made for probable transfer to a skilled facility after the weekend.  Postop day #2, T max was 99.4, vitals were stable.  Hemoglobin 9.9, hematocrit 31.9.  He was complaining of some right lateral trochanter pain, treated with muscle relaxer.  Aggressive pulmonary toilet was maintained.  He is with narcolepsy and sleep apnea and he was continued on therapy.  His Foley was discontinued at that time and his pain was better controlled.  Postop day #3, T-max was 101.6.  Incision was well approximated with staples.  No drainage.  He had some mild hypokalemia that was treated with potassium.  He was continued on therapy per protocol.  On postop day #4, he is feeling  better.  The hip feels a bit better and may be because the bed is too short and he is too long for the bed.  His pain is better controlled at this time.  He is tolerating therapy.  He is having some issues with constipation that will be treated with a laxative and plans were made for transfer to a skilled facility later today.  DISCHARGE INSTRUCTIONS:  Diet:  He is to resume regular prehospitalization diet.  MEDICATIONS:  Please see the home discharge med rec sheet for complete medication documentation.  We did place him on Percocet for pain, Robaxin for spasms, and  Xarelto for DVT prophylaxis.  ACTIVITY:  He can be out of bed weightbearing as tolerated on the right leg with use of a walker.  No lifting or driving for 6 weeks.  Please see the white total joint discharge sheet for further activity instructions.  WOUND CARE:  Please clean the right knee incision with Betadine once a day and apply dry dressing.  No tape, just hold the dressing in place with the TED stocking.  Please see the white total joint discharge sheet for further wound care instructions.  FOLLOWUP:  He is to follow up with Dr. Priscille Kluver in our office on Thursday, March 29, and needs to call [phone] for that appointment.  He is arranged for home health with [facility] upon discharge from the skilled facility.  At this point, he is to have CPM 0 to 90 degrees 68 hours a day.  If he is not at 90 degrees at this time, he can start 0 to 50 and increase it by about 10 degrees each day.  LABORATORY DATA:  Hemoglobin and hematocrit have ranged from 10.6 and 33.9 on the 16th to 9.7 and 31.4 on the 18th.  White count and platelets have been within normal limits.  Glucoses ranged from 93-131.  Potassium dropped to a low of 3.4 on the 18th.  Calcium dropped to a low of 8.3 on the 16th.  All other laboratory studies were within normal limits.     [name], P.A.   ______________________________ Carlisle Beers. Priscille Kluver, M.D.    KED/MEDQ  D:  11/02/2010  T:  11/02/2010  Job:  [id number]  Electronically Signed by Otilio Jefferson. on 11/04/2010 02:14:29 PM Electronically Signed by Erasmo Leventhal M.D. on 11/17/2010 01:42:52 PM

## 2010-11-25 ENCOUNTER — Encounter: Payer: Self-pay | Admitting: Internal Medicine

## 2010-11-25 ENCOUNTER — Other Ambulatory Visit: Payer: Self-pay | Admitting: Internal Medicine

## 2010-11-25 DIAGNOSIS — M159 Polyosteoarthritis, unspecified: Secondary | ICD-10-CM | POA: Insufficient documentation

## 2010-11-25 DIAGNOSIS — F4310 Post-traumatic stress disorder, unspecified: Secondary | ICD-10-CM | POA: Insufficient documentation

## 2010-11-25 DIAGNOSIS — E119 Type 2 diabetes mellitus without complications: Secondary | ICD-10-CM | POA: Insufficient documentation

## 2010-11-25 DIAGNOSIS — J301 Allergic rhinitis due to pollen: Secondary | ICD-10-CM

## 2010-11-25 DIAGNOSIS — I10 Essential (primary) hypertension: Secondary | ICD-10-CM | POA: Insufficient documentation

## 2010-11-25 HISTORY — DX: Post-traumatic stress disorder, unspecified: F43.10

## 2010-11-25 HISTORY — DX: Type 2 diabetes mellitus without complications: E11.9

## 2010-11-25 HISTORY — DX: Essential (primary) hypertension: I10

## 2010-11-25 HISTORY — DX: Polyosteoarthritis, unspecified: M15.9

## 2010-11-25 HISTORY — DX: Allergic rhinitis due to pollen: J30.1

## 2010-11-26 ENCOUNTER — Encounter: Payer: Self-pay | Admitting: Internal Medicine

## 2010-12-29 NOTE — Op Note (Signed)
[name], [name]              ACCOUNT NO.:  [id number]   MEDICAL RECORD NO.:  [id number]          PATIENT TYPE:  INP   LOCATION:  5019                         FACILITY:  [facility]   PHYSICIAN:  Rodney A. Mortenson, M.D.DATE OF BIRTH:  Jan 19, 1951   DATE OF PROCEDURE:  08/06/2008  DATE OF DISCHARGE:                               OPERATIVE REPORT   PREOPERATIVE DIAGNOSIS:  Severe osteoarthritis, right hip.   POSTOPERATIVE DIAGNOSIS:  Severe osteoarthritis, right hip.   OPERATION:  Total hip replacement on the right using the DePuy small  stature AML 50-mm outside diameter with a 100 series Pinnacle cup, 56-mm  outside diameter apex hole eliminator, and a Pinnacle marathon  acetabular liner +4, 10 degrees liner 36-mm inside diameter, and metal-  on-metal femoral head 8.5 mm, neck 36 mm diameter.   SURGEON:  Lenard Galloway. Chaney Malling, MD   ASSISTANTS:  1. Claude Manges. Cleophas Dunker, MD  2. Oris Drone Petrarca, PA-C   ANESTHESIA:  General.   PROCEDURE:  The patient was placed on the operating table in supine  position.  After satisfactory general anesthesia, the patient was placed  in full lateral position with the right hip up.  The right hip was then  prepped with DuraPrep and draped out in the usual manner.  A Vi-Drape  was placed over the operative site.  Posterior Sutherland incision was  made.  Skin edges were retracted and bleeders were coagulated.  Tensor  fascia lata was split, and the posterior aspect of the hip was  identified nicely.  The piriformis was identified.  A tag suture placed  through the piriformis and then was released and swept posteriorly.  The  capsule was clearly seen.  Incision was started at the superior neck of  the capsule and carried to the inferior neck of the capsule, and a  vertical incision was brought up to the labrum.  This gave access to the  head.  The head was then dislocated.  A power saw was then used to  amputate the head to appropriate length and head  was removed.  Excellent  access was achieved to the proximal end of the femur.   A Mueller retractor was placed underneath the neck, and a hole starter  was used on the superior part of the neck.  A canal finder was passed  down the femoral canal and this was followed up with a series of fully-  fluted reamers, starting with a 10-mm reamer and this was reamed out to  a 14 diameter.  Once this was accomplished, a series of broaches were  passed down the femoral canal.  The smallest broach was started first  and this was increased up to a standard size 15-mm broach and it seated  very nice.  The calcar rim was then used to smooth off the calcar.  The  femoral trial was then removed and attention was turned to the  acetabulum.  A Cobb retractor was placed around the rim of the  acetabulum.  Excellent access was achieved.  Multiple loose bodies were  seen in the hip.  A series of  cheese cutter reamers then used and this  was reamed out to a 55 mm diameter.  Good bleeding bone was achieved.  A  54-mm trial was inserted and this seated very nicely in the bottom of  the cuff.  It was felt that the 56-mm Pinnacle cup was proper size.  The  hip was irrigated with copious amounts of saline solution.  The 56-mm  100 series Pinnacle cup was then driven down and this was extremely  stable.  A trial liner was inserted.  The AML small stature 50-mm broach  was passed down the femoral canal and different size neck lengths were  used.  A 0.5, 36 mm head was seated very nicely and there was good  stability in all positions.  The hip was then dislocated.  All the trial  components were removed.  The apex hole eliminator was placed at the  base of the Pinnacle cup.  The Pinnacle marathon acetabular liner was  then snap fitted in place, capped, so locked in place.  The AML small  stature porous-coated femoral component was then passed down the femoral  canal and seated very nicely in the calcar.  A various  size balls were  used again to confirm the proper length and 8.5-mm neck was selected.  This was tapped on the end of the trunnion, and the hip reduced.  The  hip was put a through full range of motion.  The leg lengths were equal.  There was excellent stability in all positions.  The posterior capsule  was then closed with interrupted heavy Ethibond suture.  Tensor fascia  lata was closed with Ethibond sutures.  Vicryl was used to close the  deep and superficial subcutaneous tissue and stainless steel staples  were used to close the skin.  Technically, this procedure went extremely  well.  Drains none.  Complications none.  The patient returned to the  recovery room doing well.      [name], M.D.  Electronically Signed     RAM/MEDQ  D:  08/06/2008  T:  08/06/2008  Job:  [id number]

## 2011-01-01 NOTE — Op Note (Signed)
. [facility]  Patient:    LARENCE, THONE                     MRN: [id number] Proc. Date: 12/01/99 Adm. Date:  [id number] Attending:  Cornell Barman                           Operative Report  PREOPERATIVE DIAGNOSIS:  Severe osteoarthritis, left knee.  POSTOPERATIVE DIAGNOSIS:  Severe osteoarthritis, left knee.  PROCEDURE:  DePuy total knee with pressfit standard plus porous coated femoral component with a large plus glued-in LCS rotating tibial plateau with a 12.5 mm  fixed standard plus deepdish rotating bridge insert, and a cemented three-peg standard plus patella.  SURGEON:  Lenard Galloway. Chaney Malling, M.D.  Threasa HeadsBrooke Dare  ANESTHESIA:  General anesthesia.  DESCRIPTION OF PROCEDURE:  The patient was placed on the operating table in the  supine position with a pneumatic tourniquet about the left upper thigh.  After satisfactory general anesthesia, the left lower extremity was prepped with Duraprep and draped out in the usual fashion.  A Vi-drape was placed over the operative site.  The leg was elevated with an Esmarch and the tourniquet was elevated. An incision was made starting above the patella and carried down to the tibial tubercle.  The skin edges were retracted.  Bleeders were coagulated.  A long medial parapatellar incision was made and patella was everted.  A lateral release was one to get the patella out of the way.  Rongeurs were used to debride osteophytes off the margins of the femur and border of the patella.  At this point the knee was  flexed to 90 degrees.  Both medial and lateral meniscus were excised.  The tibial guide #1 was placed over the proximal tibia and the cutting block was placed at the appropriate level and fixed with fixation pegs.  The guide was removed.  The cutting jig was then placed on the cutting block and a cut in the proximal tibia was made.  An excellent cut was achieved.  The rest of the  meniscus were excised and the anterior and posterior cruciate ligaments were excised.  Attention was hen turned to the femur.  A rongeur was used to crease notch over the anterior aspect of the femur and a small rasp was used to smooth this off.  The femoral guide #1 was placed over the distal end of the femur and a pilot hole was placed in the femur.  An intermedullary rod was inserted.  Guide #1 was then held flush against the distal end of the femur and the seat clamp was inserted with the knee flexed 90 degrees.  This setup appropriate rotation in the femoral component and this was  fixed with fixation pins.  The catch guide was placed on the anterior and posterior aspects of the femoral cutting block and anterior and posterior cuts were made. At this point, a 2.5 mm spacer block was put in place and there was tightness on the medial side, but not the lateral side.  Osteophytes seen along the medial tibial plateau and medial aspect of the femur and these were removed.  Femoral guide #2 was placed with anterior aspect of the femur on the intermedullary rod.  This was held into position and fixation pins were put in place.  A distal femoral cut was then made.  With the knee extended, a 12.5 mm space  block fit very nice with excellent balancing of both medial and lateral collateral ligaments in full extension of the knee.  Femoral guide #3 was placed over the distal end of the femur and held in position with fixation pins.  Camfer cuts and drill holes were made.  Attention was then turned to the tibia.  The tibia was subluxed anteriorly with McCale.  A large plus tibia tower was held in position with fixation pins. A drill hole was made for the center keyhole.  This was then removed.  The large lus tibial trial was inserted and this fit very nicely.  The 12.5 mm deepdish rotation platform trial was inserted and the femoral component was placed over the distal end of the  femur.  The knee was put through a full range of motion.  There was excellent stability, full flexion, full extension, and excellent balancing of both the medial and lateral collateral ligaments.  Attention was now turned to the patella.  The posterior aspect of the patella was freed up and a cutting guide as placed on the posterior aspect of the patella.  This was then amputated.  The jig for the three-peg patella was held in position and drill holes were made.  The trial patella was then inserted and knee put through a full range of motion. There was excellent alignment of all the components and excellent balancing of all soft tissues.  All of the trial components were removed.  Pulsating lavage was used.  All debris was removed.  Antibiotic solution was inserted.  The posterior capsule was then hit with the Bovie.  Glue was mixed.  Glue was placed over the proximal tibia and posterior aspect of the patella.  The tibial tray was inserted and excess glue was removed.  The final deepdish rotation bridge insert was put in place and the pressfit porous coated femoral component was placed over the distal end of he femur.  This fit anatomically.  The knee was then put in full extension and excess glue was removed.  The patella was then inserted and held in place with the patella clamp and excess glue was removed.  Antibiotic solution was used to irrigate throughout.  Once this was set up, the tourniquet was let down and bleeders were coagulated.  A Hemovac drain was put in place.  Heavy Ethibond sutures were used to close along the medial parapatellar position.  5-0 PDS to close the subcutaneous tissue and stainless steel staples were inserted.  A large bulky dressing was applied and the patient returned to the recovery room in excellent condition. Technically, this procedure went beautifully.  Drains; hemovac.  Complications ere none. DD:  12/01/99 TD:  12/01/99 Job:  [id number]

## 2011-01-01 NOTE — Discharge Summary (Signed)
[name], [name]              ACCOUNT NO.:  [id number]   MEDICAL RECORD NO.:  [id number]          PATIENT TYPE:  INP   LOCATION:  2032                         FACILITY:  [facility]   PHYSICIAN:  Eric L. August Saucer, M.D.     DATE OF BIRTH:  06-26-1951   DATE OF ADMISSION:  09/25/2004  DATE OF DISCHARGE:  09/29/2004                                 DISCHARGE SUMMARY   FINAL DIAGNOSES:  1.  Syncope and collapse (780.2).  2.  Hypertension (401.9).  3.  Diabetes mellitus type 2 (250.00).  4.  Otitis media (382.9).  5.  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  6.  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  7.  Abnormal findings in the skull (793.00).  8.  Osteoporosis, multiple sits (715.89).  9.  History of SULFONAMIDE ALLERGY (314.2).   OPERATION PROCEDURES:  1.  Left heart cardiac catheterization per Dr. Sharyn Lull.  2.  Left heart angiocardiogram.  3.  Coronary arteriogram.   HISTORY OF PRESENT ILLNESS:  This was one of several [facility]  admissions for this 60 year old married black male who was brought to the  emergency room via EMS following a presyncopal episode at approximately 1:30  a.m.  The patient recalls awakening with right sided headache.  He described  this as being sharp and dull in nature with associated nausea without  vomiting.  Wife subsequently heard patient fall without loss of  consciousness.  Patient was noted to be weak with a staring spell.  He would  not respond to vocal stimulation.  His wife assisted him to the toilet for a  bowel movement.  The patient remained quite weak thereafter with no  improvement.  The wife subsequently called EMS.  The patient was  subsequently brought to the emergency room for further evaluation.  The  patient has a past history of similar episodes approximately 1 year ago  without clear etiology.  This episode, however, was no proceeded by nausea.  An evaluation at that time did not reveal a significant cardiac or  neurological cause  for his activities.   PAST MEDICAL HISTORY AND PHYSICAL EXAM:  Is otherwise as noted per admission  H&P.   HOSPITAL COURSE:  Patient was admitted for further evaluation of syncopal  episodes.  A question of arrhythmia vs. Seizure vs. other.  Patient was  admitted to telemetry acutely.  Cardiac enzymes __________ x3 which were  negative for underlying myocardial infarction.  Notably, there was no  evidence for significant arrhythmias as well.  The patient was seen in  consultation by Neurology as well.  Further evaluation was pursued.  MRI  scan and MRA was without definite focality.  He was noted to have a left  ethmoid sinus disease.  The possibility of an underlying seizure disorder  vs. atypical presentation for sleep apnea was also pursued.  He had  previously been noted to have mild sleep apnea but did not require  treatment.   The patient notably did recall having some chest tightness prior to this  episode as well.  The patient was seen by Dr. Algie Coffer for evaluation.  He  did undergo a Cardiolite study which was equivocal.  He subsequently did  undergo further cardiac evaluation per Dr. Sharyn Lull with cardiac  catheterization.  This was performed on September 28, 2004.  He was found to  have good LV function with an ejection fraction of 50-55%.  He had patent  vessels with only mild disease in the left circumflex and high __________  vessel.  Right coronary artery had 10-15% mid stenosis.  No other  significant abnormality was found.   On further evaluation, the patient did acknowledge having difficulty  sleeping several weeks prior to these events.  He was previously set up to  undergo further evaluation for sleep apnea at the [facility].  From  further review, it is recommended that patient pursue this further.  His  medications were adjusted for further control of his blood pressure as well.  A 2D echocardiogram showed no significant wall motion abnormality.  No   valvular abnormality as well.  His ANA was negative.   No further significant neurological underlying abnormality was found to  explain his syncopal events.  Further evaluation will be pursued as an  outpatient.   MEDICATIONS AT THE TIME OF DISCHARGE:  Consisted of:  1.  Verapamil 240 mg p.o. daily.  2.  Bromocriptine 5 mg t.i.d.  3.  Fluoxetine 30 mg daily  4.  Amitriptyline 25 mg q.h.s.  5.  Altace 5 mg daily.  6.  Lipitor 20 mg daily.  7.  Enteric coated aspirin 325 mg daily.  8.  Plavix 75 mg daily.  9.  Avelox 400 mg daily for 5 days.   He will be maintained on a 4 g sodium, 2000 calorie ADA diet.  He is to  followup at the [facility] for psychiatry followup.  Question of changing his  fluoxetine to Zoloft as well.  He will need further evaluation of sleep  apnea.  He is to be seen in the office otherwise in 3 weeks' time.       ELD/MEDQ  D:  01/22/2005  T:  01/22/2005  Job:  [id number]

## 2011-01-01 NOTE — Op Note (Signed)
NAMEDUBLIN, GRAYER              ACCOUNT NO.:  [id number]   MEDICAL RECORD NO.:  [id number]          PATIENT TYPE:  INP   LOCATION:  2899                         FACILITY:  [facility]   PHYSICIAN:  Clydene Fake, M.D.  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   POSTOPERATIVE DIAGNOSIS:  Cervical stenosis, spondylosis with myelopathy at  C7-T1.   PROCEDURE:  C7-T1 decompressive cervical laminectomy, posterolateral fusion  with autograft and posterior instrumentation with Summit system.   SURGEON:  Clydene Fake, M.D.   ASSISTANT:  Coletta Memos, M.D.   ANESTHESIA:  General endotracheal tube anesthesia.   ESTIMATED BLOOD LOSS:  200 mL.   BLOOD REPLACED:  None.   DRAINS:  None.   COMPLICATIONS:  None.   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  Over  the last month or so has had progressive weakness especially in the right  side and on exam found to be somewhat more myelopathic and more trouble with  gait.  MRI of the cervical spine was done showing stenosis at the level  below the anterior fusion at C7-T1 mainly posterior from his ligamentous  hypertrophy pushing into the spinal cord.  Patient brought in for  decompression and fusion.  Patient was worked up medically for this same  problem plus some falling and possibly spells of loss of consciousness.  Cardiac catheterization was done and other tests which were all negative.  Not sure how that correlates with his symptoms but patient brought in for  decompression of his spinal cord.   DESCRIPTION OF PROCEDURE:  Patient brought to the operating room.  General  anesthesia induced.  Patient was placed in [place] pins and placed in prone  position.  All  pressure points padded.  Patient was prepped and draped in  sterile fashion at the posterior neck.  Site of incision was injected with  20 mL of 1% lidocaine with epinephrine.  Incision was then made from the T6  spinous process to the bottom of the T1 spinous process.  Incision taken  down to the fascia and hemostasis obtained with Bovie cauterization.  The  fascia was incised with the Bovie on the left side and subperiosteal  dissection was done.  A the C7 spinous process a marker was placed at the 6-  7 facet joint and fluoroscopy was used to confirm our positioning.  We then  continued dissection, dissecting out the lateral mass of C6 and C7 and  spinous process of T1 both left and right side.  We had exposure of this  area and another fluoroscopic imaging was again used to confirm our  positioning.  Leksell rongeurs and then [employer] along with high  speed drill were all used to perform a decompressive laminectomy removing  the bottom three quarters of C7 and the top half of T1 decompressing the  central canal.  As we worked laterally, we found hypertrophic facets with  hypertrophic ligament pushing into the cord from the lateral side on both  sides but worse on the right and this was decompressed with high speed drill  and Kerrison punches and curettes.  As we kept dissecting laterally on the  nerve roots, we made sure the foramen root was clear on both sides.  Hemostasis obtained with Gelfoam and thrombin.  Our dissection took [place] to  the T1 pedicle and we could feel that with a nerve hook.  We used a high  speed drill to start a hole over the T1 pedicle.  We used a Summit drill and  guide to drill down the pedicle into the T1 vertebral body first on the  right and then on the left.  We tapped the hole and then placed 22 mm Summit  screws into the T1 pedicle.  We then roughed up the facets at the C7-T1 with  high speed drill until lateral decortication of C7, T1 and even into  C6, C7  facet was looked at and there was no movement there consistent with her  anterior fusion.  We prepared C6 for possible instrumentation if the C7  lateral mass was too small, but our next step was drill a guide hole at the  C7 lateral mass using the Summit drill, drilling into the lateral mass  upward and outward.  We tapped the hole and then were able to place a 14 mm  Summit screw into the C7 lateral mass bilaterally.  This gave [place] good  purchase and we did not put any further instrumentation in.  Rod was a good  size and placed it into the top of the screws on each side and locking nuts  placed and these locking nuts were finally tightened down.  A semi-oblique  image was done showing C7 lateral mass __________ T1 pedicle screws in good  position.  We irrigated with antibiotic solution and again explored the  spinal canal and the nerve roots and they were all decompressed and all the  autograft bone that was removed during the laminectomy was cleaned from  _________ small pieces.  This was laid in posterolateral gutters into the  facets from C6 to T1 for two level posterior fusion bilaterally.  Small  pieces of Gelfoam were placed over the nerve roots so no bone chips would  fall and compress on them.  Retractors removed.  We had good hemostasis and  paraspinous muscle and then the fascia were closed with 0 Vicryl interrupted  sutures, subcutaneous tissue closed with 0, 2-0 and 3-0 Vicryl interrupted  sutures and skin closed with Benzoin and Steri-Strips.  Dressing was placed.  Patient was placed back to supine position and removed from the head pins,  placed in the cervical collar and awakened from anesthesia and transferred  to the recovery room.      JRH/MEDQ  D:  10/26/2004  T:  10/26/2004  Job:  [id number]

## 2011-01-01 NOTE — Discharge Summary (Signed)
[facility]  Patient:    [name], [name]                     MRN: [id number] Adm. Date:  [id number] Disc. Date: [id number] Attending:  Cornell Barman Dictator:   Arnoldo Morale, P.A.-C. CC:         Lind Guest. August Saucer, M.D.                           Discharge Summary  ADMITTING DIAGNOSES: 1. End-stage osteoarthritis, bilateral knees. 2. Hypertension. 3. History of migraines. 4. History of anxiety and depression.  DISCHARGE DIAGNOSES: 1. End-stage osteoarthritis, bilateral knees. 2. Hypertension. 3. History of migraines. 4. History of anxiety and depression. 5. Constipation.  SURGICAL PROCEDURE:  On December 01, 1999, Mr. [name] underwent a left total knee arthroplasty by Dr. Rinaldo Ratel.  COMPLICATIONS:  None.  CONSULTS: 1. Anesthesia consult for epidural catheter, December 01, 1999, in addition to a    pharmacy consult for Coumadin therapy. 2. Case management, physical therapy, and consult on December 02, 1999. 3. Occupational therapy consult on December 03, 1999.  HISTORY OF PRESENT ILLNESS:  This 60 year old male presents to Dr. Chaney Malling with a history of problems with both knees since the early 70s.  He has had multiple knee arthroscopies in the past with moderate improvement until September of last year.  At this time, the pain in his left knee is located over the posterolateral aspect of the knee and it is present mostly at night. It does improve slightly with short-term ambulation but does increase with prolonged standing.  There is popping, clicking, grinding, and swelling of the knee, and the pain increases with prolonged ambulation.  He does complain of stiffness in the knee and this has not been relieved with his most recent arthroscopy.  Because of this, he is presenting for a left total knee arthroplasty.  HOSPITAL COURSE:  [name] tolerated his surgical procedure well and was subsequently transferred to [facility] [place].  There were  no immediate postoperative complications.  An epidural catheter was placed for pain control.  On his first postoperative night, he did have an episode of hypertension due to the epidural medication and that was turned off and was subsequently discontinued later on April 18.  He was started on OxyContin p.o. for pain.  This did do well.  His left leg was neurovascularly intact and dressing intact on first postoperative day.  His hemoglobin was 1.7 with a hematocrit of 36.1, and his T max was 100.2.  His PT was 15.1 with an INR of 1.4.  He was started on PT per protocol.  On postoperative day #2, T max was 99.4 and his vitals were stable.  Pain was well controlled with p.o. medications.  Dressing to the left leg was changed and his wound was well approximated without drainage.  Hemovac was discontinued.  Hemoglobin was 12.8 with hematocrit of 38.7.  He was continued on PT, OT per protocol.  On April 20, he was feeling very good and doing well with his therapy and also pain control.  He wished to go home.  His T max was 100.8 and vitals were stable.  Pulse was 106, respirations 20, and BP 153/81.  The left knee incision was well approximated with staples without drainage.  Hemoglobin was 12.7 with a hematocrit of 38.7 and a white count of 10.9.  He had not had a bowel  movement, so EOC/LOC was given to help facilitate that and he was able to be discharged home.  DISCHARGE INSTRUCTIONS: 1. He was to resume all prehospital medications and diet except for the    etodolac. 2. OxyContin 20 mg one tablet p.o. q.12h., 20 of those with no refill, OxyIR    5 mg one to two p.o. q.4h. p.r.n. for pain, 45 of those with no refills,    and Coumadin one tablet p.o. q.d. with a dose to be determined by pharmacy. 3. He was to be out of bed, partial weightbearing, 50% or less, on the left    leg with the use of the walker. 4. He was to keep a temperature diary with four temperatures a day and bring     that with him on his first postoperative visit. 5. He was to follow up with Dr. Chaney Malling next Monday or Wednesday in the    office and was to call [phone] to set up that appointment. 6. He was to arrange for home health physical therapy and a home health R.N.    for his physical therapy and blood draws. 7. He was to notify Dr. Chaney Malling of the temperature greater than equal to    101.5, chills, pain unrelieved by medications, or foul, smelly drainage    from the wound.  He stated good understanding of these instructions and was subsequently discharged home later in the day.  LABORATORY DATA:  On December 02, 1999, his white count was 8.6 with a hemoglobin of 11.7 and hematocrit of 36.1.  On April 19, white count was 10.7, hemoglobin 12.8, and hematocrit 38.7.  On April 20, his white count was 10.9, hemoglobin 12.7, hematocrit 38.7, and platelet count 235.  On April 16, his PT was 13.5 seconds with an INR of 1.1 and a PTT of 28.  On April 20, his PT was 16.5 seconds with an INR of 1.6.  All other laboratory studies were within normal limits. DD:  12/16/99 TD:  12/17/99 Job: [id number]

## 2011-01-01 NOTE — Consult Note (Signed)
[name]              ACCOUNT NO.:  [id number]   MEDICAL RECORD NO.:  [id number]          PATIENT TYPE:  INP   LOCATION:  2032                         FACILITY:  [facility]   PHYSICIAN:  Marlan Palau, M.D.  DATE OF BIRTH:  [birth year]   DATE OF CONSULTATION:  09/25/2004  DATE OF DISCHARGE:                                   CONSULTATION   HISTORY OF THE PRESENT ILLNESS:  [name] is a 60 year old right-  handed black male born [birth year] with a history of syncopal events.  The patient was admitted to [facility] in February 2005 with three episodes  of syncope and was found to be somewhat orthostatic at that time.  The  patient was treated for orthostasis and released.  EEG study around that  time was unremarkable.  The patient had some questionable drooping on the  right side of the face at that time.  An MRI scan of the brain apparently  was not performed, but a CT of the head was unremarkable.  CT scan on this  admission today was also normal.  The patient comes into the hospital at  this point after an event of loss of consciousness that occurred while  getting out of bed this morning.  The patient was trying to make it to the  bathroom and then suddenly lost consciousness.  There is some question as to  whether the patient may have had some jerking/twitching on the floor after  he blacked out.  The patient himself does not recall any problems.  The  patient did not bite his tongue, did not lose control of the bowels or  bladder.  The patient had no warning of the above event.  The patient did  not regain consciousness, however, until he came to the emergency room.  He  recalls nothing of EMS arriving at the house or transporting him to the  hospital.  Neurology therefore was called for further evaluation.   The patient has been set up for an MRI scan of the brain, which has not yet  been done and EEG study is pending.   PAST MEDICAL HISTORY:  The past  medical history is significant for:  1.  History of recurring syncope as above and questionable seizures too.  2.  History of depression and anxiety.  3.  History of headaches; the patient has recently been followed by Dr.      Meryl Crutch and the [facility].  4.  History of hypertension.  5.  History of diabetes.  6.  History of degenerative joint disease status post left arthroplasty.   MEDICATIONS:  Medications at this time include:  1.  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  2.  Paxil 30 mg daily.  3.  Verapamil 240 mg a day.  4.  The patient had been on some lorazepam prior to admission as well, and      was on triamterene, lisinopril, hydrochlorothiazide and Glucophage as      well as amitriptyline prior to  admission.   ALLERGIES:  The patient has an allergy to SULFA DRUGS.   SOCIAL HISTORY:  The patient does not smoke or drink.   SOCIAL HISTORY:  The patient is married.  [place]  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   FAMILY HISTORY:  Family medical history is notable for mother dying with  diabetes and Alzheimer's disease.  Father died with MS.  A son has a history  migraines.   REVIEW OF SYSTEMS:  The review of systems is notable for some problems with  chest tightness on and off.  The patient does note occasional dizziness with  standing.  Denies any visual disturbance.  Patient has not had any  palpitations of the heart or racing of the heart.  Denies any nausea or  vomiting.  Denies any significant problems with headaches recently.  The  patient denies any weakness or numbness per se at this point.   PHYSICAL EXAMINATION:  VITAL SIGNS:  Blood pressure is 128/73, heart rate  67, respiratory rate 20 and temperature afebrile.  GENERAL APPEARANCE:  In general this patient is a moderately obese black  male who is alert and cooperative at the time of examination.  HEENT  EXAMINATION:  Head is atraumatic.  Eyes;  pupils are equal, round and  react to light.  Disks are flat bilaterally.  NECK:  The neck is supple.  No carotid bruits noted.  CHEST:  Respiratory examination is clear.  HEART:  Cardiovascular examination reveals a regular rate and rhythm.  No  obvious murmurs or rubs noted.  EXTREMITIES:  The extremities are without significant edema.  NEUROLOGIC EXAMINATION:  Cranial nerves as above.  Facial symmetry is  present.  The patient has good sensation of the face to pinprick and soft  touch bilaterally.  He has good strength in facial muscles and muscles to  head turning _and shoulder shrug_________ bilaterally. Speech is well  enunciated and nonaphasic.  Patient protrudes the tongue in the midline.  Good withdrawal to strength.  Again extraocular movements are full.  Visual  fields are full.  No aphasia noted.  Motor test reveals 5/5 strength in all  four with good symmetric motor tone.  Sensory testing reveals decreased  pinprick sensation and vibratory sensation on the right arm and right leg  greater than the left, otherwise normal on the left.  The patient has good  finger-nose-finger and heel-to-shin bilaterally.  Gait is relatively  unremarkable.  Tandem gait is slightly unsteady.  Romberg's is slightly  unsteady; patient tends to go to the left at bit.  No drift is seen.  Deep  tendon reflexes are present throughout.  Toes neutral.  Patient has no clear  stocking glove sensory deficit on either side; again, no drift is seen.   LABORATORY DATA:  Laboratory values are notable for a white count of 10.1,  hemoglobin of 14.9, hematocrit of 45.8, MCV of 74.7, and platelets of  279,000.  Sodium 137, potassium 3.6, chloride 104, glucose of 116, BUN of  21, and creatinine 1.4.  CK of 442, MB fraction 1.7.   EKG reveals normal sinus rhythm, nonspecific T wave abnormalities and heart  rate of 64.   IMPRESSION: 1.  History of syncope with episodes of  syncope previously.  2.  Diabetes.  3.  History of prolactin secreting pituitary tumor.   This patient has had episodes of syncope in the past that apparently are  associated with orthostasis.  The patient will need further evaluation at  this point.  We need to check again for orthostasis as the patient is at  risk for this with diabetes, and use of amitriptyline and antihypertensive  medications.  The patient, however, with episode of syncope was unconscious  for at least a half hour or more, which is a bit unusual for orthostasis.  There is some questionable history of jerking; we will need to discuss this  with his wife who observed the event.  The patient did not bite his tongue  or lose control of the bowels or the bladder.   We will proceed with further workup at this point.  The patient gives a  right-sided depression and sensation in the arm and leg.   PLAN:  1.  MRI scan of the brain.  2.  MR angiogram of the intracranial and extracranial vessels; rule out      vertebral basilar insufficiency, subclavian Steel.  3.  EEG study.  4.  No anticonvulsants for now.  We will review the above studies once they      have been done.  5.  Check orthostatic blood pressures and urine drug screen.  6.  We will follow the patient's clinical course while in house.      CKW/MEDQ  D:  09/25/2004  T:  09/26/2004  Job:  [id number]   cc:   Minerva Areola L. August Saucer, M.D.  [address] [address]  [place]  [name] [address]  Fax: [phone]

## 2011-01-01 NOTE — Consult Note (Signed)
[name], [name]              ACCOUNT NO.:  [id number]   MEDICAL RECORD NO.:  [id number]          PATIENT TYPE:  INP   LOCATION:  5729                         FACILITY:  [facility]   PHYSICIAN:  Pramod P. Pearlean Brownie, MD    DATE OF BIRTH:  1950/12/16   DATE OF CONSULTATION:  10/14/2004  DATE OF DISCHARGE:                                   CONSULTATION   REFERRING PHYSICIAN:  Eric L. August Saucer, M.D.   REASON FOR REFERRAL:  Right-sided weakness.   HISTORY OF PRESENT ILLNESS:  [name] is a 60 year old, African-American  male who states he developed sudden onset of right-sided weakness this  afternoon at about 3:00.  He states he actually noticed some right-sided  pain last night involving the right ankle mainly but progressing to involve  the thigh, and the right hand progressing to involve the forearm.  He states  that he was unable to get up earlier without assistance.  He needed some  help.  He tried taking some Tylenol for the pain with help.  He denies any  headache, slurred speech, blurred vision, double vision, any pain in his  neck shooting down his arm or spine.  He has had a history of neck pain and  some gait difficulties last year for which he underwent C-spine surgery by  Dr. Phoebe Perch.  He has recovered in his symptoms with no residual gait  difficulty or neck pain.  He has a past a neurological history of episodes  of brief loss of consciousness for the last two years.  He had an extensive  workup for syncope last year, which was negative.  Earlier this __________  and fell off of the toilet along with some question of the right-sided  weakness and choking.  At that time, workup included CT scan and MRI scan of  the brain with MRA of the brain, all of which __________.  He also had a  cardiac workup, which was negative.  The patient has seen Dr. Anne Hahn in our  office for his gait disorder.  The patient has a past neurological history  of headache for which he is followed at the  [place]; these headaches are not  presently quite active.   PAST MEDICAL HISTORY:  Hypertension, depression, anxiety, plus suspected  sleep apnea, posttraumatic stress disorder.   PAST SURGICAL HISTORY:  1.  C-spine surgery in April 2005.  2.  Left knee replacement, 2003.  3.  Right shoulder repair.   MEDICATION ALLERGIES:  None known.   FAMILY HISTORY:  Significant for Alzheimer's disease.   REVIEW OF SYSTEMS:  Significant for loss of consciousness, gait  difficulties, pain, and weakness.   SOCIAL HISTORY:  The patient is married.  Lives with his wife.  Does not  smoke or drink.   HOME MEDICATIONS:  1.  Bromocriptine.  2.  Paroxetine.  3.  Triamterene/hydrochlorothiazide.  4.  Amitriptyline.  5.  Verapamil.  6.  Artificial Tears.   PHYSICAL EXAMINATION:  GENERAL:  Reveals a well-built, middle-aged, African-  American male who is presently not in distress.  TEMPERATURE:  Afebrile.  PULSE  RATE:  70 per minute, regular.  RESPIRATORY RATE:  18 per minute __________.  HEAD:  Atraumatic.  NECK:  Supple, without bruit.  ENT:  Unremarkable.  CARDIAC:  No murmur or gallop.  LUNGS:  Clear to auscultation.  NEUROLOGIC:  The patient is pleasant, awake, alert, cooperative.  There is  no aphasia, apraxia, or dysarthria.  Visual acuity and fields adequate.  Face is symmetric.  Palatal movements are normal.  Tongue is midline.  Motor  system exam reveals no upper extremity drift.  He has some giveaway weakness  in the right hand, mainly due to pain.  He has some swelling and pain of the  right wrist as well as metacarpophalangeal joints with some focal  tenderness.  He also has some tenderness over the right forearm muscles, but  there is no __________.  He has symmetric strength in all four extremities.  There is no __________ ankle jerks are 1+.  __________ sensory loss to the  feet, he feels that he cannot walk __________ of the favoring of the right  foot due to pain __________.   Tandem walking was not testing.   DATA REVIEWED:  MRI scan of the brain, MRA of the brain and __________.  EEG  dated September 28, 2004 is normal.  Cardiac echo dated September 28, 2004 is  normal, with ejection fraction __________ any significant abnormalities.  Cardiac nuclear scan dated September 26, 2004 shows some decreased activity  near the septum, cardiac apex, with ejection fraction of 50%.   IMPRESSION:  An 60 year old male with right-sided weakness and pain,  unlikely from a central cause like a stroke or C-spine disease.  Likely  peripheral musculoskeletal etiology.  I would recommend checking ESR, CPK,  LDH, as well as MRI of the C-spine to rule out compressive disease.  Treatment with nonsteroidal antiinflammatory drugs or mild narcotics for his  pain.  He has had extensive neurovascular and cardiac evaluation __________  recommended further tests __________ above tests are negative __________.   __________.      PPS/MEDQ  D:  10/14/2004  T:  10/15/2004  Job:  [id number]   cc:   Marlan Palau, M.D.  [address]  [address]  [place]  [name] [address]  Fax: [phone]

## 2011-01-01 NOTE — Cardiovascular Report (Signed)
NAMEEDD, REPPERT              ACCOUNT NO.:  [id number]   MEDICAL RECORD NO.:  [id number]          PATIENT TYPE:  INP   LOCATION:  2032                         FACILITY:  [facility]   PHYSICIAN:  Mohan N. Sharyn Lull, M.D. DATE OF BIRTH:  01/23/1951   DATE OF PROCEDURE:  09/28/2004  DATE OF DISCHARGE:                              CARDIAC CATHETERIZATION   PROCEDURE:  Left cardiac catheterization with selective left and right  coronary angiography, left ventriculography via right groin using Judkins  technique.   INDICATIONS FOR PROCEDURE:  Mr. [name] is a 60 year old black male  with past medical history significant for hypertension, insulin dependent  diabetes mellitus, depression, degenerative joint disease, history of  pituitary adenoma, who was admitted on September 25, 2004 following a  presyncopal episode. He states he woke up around 1:30 a.m.; while going to  the bathroom, felt dizzy and became weak, off balance and nearly passed out.  He denies any weakness in the arms or legs, but also noted to have unsteady  gait. No obvious seizure activity was noted. The patient denies any  palpitations or chest pain prior to the syncopal episode. The patient gives  history of exertional chest tightness last week while playing with a dog,  lasting a few minutes and also gives history of exertional dyspnea.  Presently,  the patient denies any chest pain. The patient underwent  Persantine Myoview which showed inferior wall scarring with questionable  small area of ischemia in the distal septal region near the apex with EF of  46%. Due to typical anginal chest pain and mildly abnormal Persantine  Myoview, discussed at length regarding various options of treatment with the  patient, i.e., left cath versus medical management, its risks and benefits,  i.e., death, MI, stroke, need for emergency CABG, risk of restenosis, local  vascular complications, etc, and consented for the procedure.   PROCEDURE:  After obtaining the informed consent, the patient was brought to  the cath lab and was placed on fluoroscopy table. Right groin was prepped  and draped in usual fashion. Xylocaine 2% was used for local anesthesia in  the right groin. With the help of a thin-wall needle, a 6-French arterial  sheath was placed. The sheath was aspirated and flushed. Next, 6-French left  Judkins catheter was advanced over the wire under fluoroscopic guidance up  to the ascending aorta. Wire was pulled out.  The catheter was aspirated and  connected to the manifold. Catheter was further advanced and engaged into  left coronary ostium. Multiple views of the left system were taken. Next the  catheter was disengaged and was pulled out over the wire and was placed with  6-French right Judkins catheter, which was advanced over the wire under  fluoroscopic guidance up to the ascending aorta. Wire was pulled out.  The  catheter was aspirated and connected to the manifold. Catheter was further  advanced and attempted to engage into right coronary ostium without success.  This catheter was exchanged over the wire to __________ right catheter,  which was advanced over the wire under fluoroscopic guidance up to the  ascending aorta calf.  Wire was pulled out.  The catheter was aspirated and  connected to the manifold. Catheter was further advanced and engaged into  right coronary ostium. Multiple views of the right system were taken. Next,  the catheter was disengaged and was pulled out over the wire and was  replaced with a 6-French pigtail catheter which was advanced over the wire  under fluoroscopic guidance into the ascending aorta. Wire was pulled out.  The catheter was aspirated and connected to the manifold. Catheter was  further advanced across the aortic valve into the LV. LV pressures were  recorded. Next left ventriculography was done in 30-degrees RAO position.  Post angiographic pressures were  recorded from LV and then pullback  pressures were recorded from the aorta. There was no significant gradient  across aortic valve. Next, the pigtail catheter was pulled out over the  wire. Sheaths aspirated and flushed.   FINDINGS:  LV showed good LV systolic function, EF of 50-55%. Left main was  patent. LAD was patent. Diagonal 1 was moderate-sized, which was patent.  Diagonal 2 was small, which was patent. Left circumflex has  15% to 20% ostial stenosis. High OM-1 has 10% to  15% ostial stenosis, OM-2  is very, very small. OM-3 is also small, which is patent. RCA has 10% to 15%  mid-stenosis. The patient tolerated procedure well. There were no  complications. The patient was transferred to recovery room in stable  condition.      MNH/MEDQ  D:  09/28/2004  T:  09/29/2004  Job:  [id number]   cc:   [facility]   Minerva Areola L. August Saucer, M.D.  [address] [address]  [place]  [name] [address]  Fax: [phone]

## 2011-01-01 NOTE — Discharge Summary (Signed)
[name], [name]              ACCOUNT NO.:  [id number]   MEDICAL RECORD NO.:  [id number]          PATIENT TYPE:  INP   LOCATION:  5019                         FACILITY:  [facility]   PHYSICIAN:  Tim A. Mortenson, M.D.DATE OF BIRTH:  February 18, 1951   DATE OF ADMISSION:  08/06/2008  DATE OF DISCHARGE:  08/09/2008                               DISCHARGE SUMMARY   ADMISSION DIAGNOSIS:  Osteoarthritis of the  right hip.   DISCHARGE DIAGNOSES:  1. Osteoarthritis of the right hip.  2. Sleep apnea.  3. Diabetes mellitus.  4. Hypertension.  5. Obesity.  6. Depression.   PROCEDURE:  Right total hip arthroplasty.   HISTORY:  [name] is a very pleasant 60 year old African American  male who presented with right hip and groin pain, initially, started in  October 2009.  Now he is having severe sharp, constant, and stabbing  pain with ambulation and ADLs.  Pain at night also.  He has failed  conservative treatment.  Now indicated for right total knee  arthroplasty.   HOSPITAL COURSE:  A 60 year old African American male admitted on  August 06, 2008, and after appropriate laboratory studies were  obtained as well as 1 g Ancef IV on-call to the operating room.  He was  taken to the operating room where he underwent a right total hip  arthroplasty.  He tolerated the procedure well.  He was placed on a  Dilaudid full-dose PCA pump for pain management.  An Arixtra 2.5 mg  subcu q.8 h. a.m. was started on August 07, 2008.  Foley was placed  intraoperatively.  Consults with PT/OT and care management were made  with weightbearing as tolerated on the right.  He was allowed bed to  chair the following days.  Apparently, intraoperatively he had a corneal  abrasion occur to his right eye.  Initially placed on Natural Tears, but  then placed on Ocuflox drops 1 gtt. q.i.d., Acular ophthalmic drops 1  gtt. q.i.d., and paper tape the right eye for comfort.  The patient was  also allowed to use CPAP  machine.  There was a question as to the use of  his home medications.  However, because of hospital policy, the  pharmacist did not allow this to occur.  On the postop day #1, he was  weaned off his PCA and has had the Arixtra instructions given.  Postop  day #1, he was also allowed to ambulate.  Remainder of his hospital  course was uneventful and he was discharged on August 09, 2008, to  return back to the office in followup.   LABORATORY DATA:  Admitted with a hemoglobin of 13.6, hematocrit 43.5%,  white count 6000, and platelet count 263,000.  Discharge hemoglobin  10.7, hematocrit 33.8%, white count 8700, and platelets 187,000.  Preop  sodium 138, potassium 4.1, chloride 102, CO2 of 28, glucose 100, BUN 14,  creatinine 1.20.  Discharge sodium 136, potassium 3.7, chloride 100, CO2  of 27, glucose 97, BUN 13, and creatinine 1.14.  GFR preop was greater  than 60 and discharge was greater than 60.  Preop total protein  6.9, BUN  4.0, AST 27, ALT 26, ALP 81, total bilirubin 0.8.  Glycosylated  hemoglobin 6.7.  Urinalysis benign for voided urine.  Urine culture  showed no growth.  Blood type O+, antibody screen negative.   DISCHARGE INSTRUCTIONS:  Low-sodium heart-healthy diet.  Keep incision  clean and dry.  Change dressing daily and cover with sterile 4x4s.  Increase activity slowly.  Crutches, weightbearing as tolerated with a  walker.  No lifting or driving for 6 weeks.  May shower on Saturday.  Medications include Arixtra 2.5 mg each morning at 8 a.m., last dose on  August 16, 2008.  Then, begin aspirin 325 mg on August 17, 2008,  Percocet 5/325 one to two tablets every four hours as needed for pain,  and Robaxin 500 mg 1 tablet every 8 hours as needed for spasms.  [name]  for home health and physical therapy.  Follow up with Dr. Chaney Odonnell on  August 19, 2008.   Discharged with improved condition.      Oris Drone Petrarca, P.A.-C.      [name], M.D.   Electronically Signed    BDP/MEDQ  D:  09/12/2008  T:  09/13/2008  Job:  [id number]

## 2011-01-01 NOTE — Discharge Summary (Signed)
NAMECHETAN, MEHRING                        ACCOUNT NO.:  [id number]   MEDICAL RECORD NO.:  [id number]                   PATIENT TYPE:  INP   LOCATION:  3727                                 FACILITY:  [facility]   PHYSICIAN:  Eric L. August Saucer, M.D.                  DATE OF BIRTH:  June 22, 1951   DATE OF ADMISSION:  09/18/2003  DATE OF DISCHARGE:  09/19/2003                                 DISCHARGE SUMMARY   FINAL DIAGNOSES:  1. Syncope and collapse. 780.2.  2. Diabetes mellitus, type 2, non-insulin-dependent.  250.00.  3. Osteoporosis, left leg.  715.36.  4. Allergic rhinitis.  477.9.  5. Orthostatic hypotension.  458.0.   PROCEDURES PERFORMED:  None.   HISTORY OF PRESENT ILLNESS:  This was the first recent [facility]  admission for this 60 year old, married black male with a long standing  history of hypertension, vascular headaches with history of documented  pituitary enlargement.  The patient had been doing fairly well up until one  day prior to admission.  The patient's wife reports that he had complained  of a vague headache most of that day.  He described the headache as being  dull, left posterior in nature.  He also noted fullness in the frontal  sinuses as well.  That night, the patient's wife reported that he snored his  usual but there was no associated changes in his breathing pattern, no apnea  spells were reported.  At approximately 1:26 a.m. the patient apparently got  up to go to the bathroom.  The patient thereafter collapsed and fell.  The  wife assisted the patient back up and he subsequently had another presyncope  event. She noted that on the second time he did go back and hit the back of  his head.  He did lose consciousness for greater than one minute per the  family, though not actually documented.  There was no associated seizure  activity, no urinary or fecal incontinence.  She did note that he looked  somewhat confused with some drooping of the right  side of his face which was  very transient.  Because of the patient's changing status he was admitted  for further evaluation.   PAST MEDICAL HISTORY AND PHYSICAL EXAMINATION:  As per the admission history  and physical.   HOSPITAL COURSE:  The patient was admitted for further evaluation of  syncopal spells of questionable etiology.  Question of vasovagal reaction  versus atypical seizures versus arrhythmias.  He was placed on telemetry for  close monitoring.  MRI scan had recently been done which showed a mild  enlargement of pituitary gland without significant changes. He was seen in  consultation by Dr. Sharene Skeans of neurology as well.  The patient did undergo  carotid Dopplers of the neck which was unremarkable.  An EEG was also  performed which was found to be negative as well.  The  patient was noted to  have mild orthostasis. This was monitored closely which gradually improved  over the subsequent days.  The patient subsequently had no further events.  It was felt that it was not a neuro cardiogenic CPP.  The possibility of  orthostatic hypotension being the major component could not be totally  excluded.  It was felt that further work up in the hospital setting was not  necessary.  The patient was subsequently discharged home feeling much  improved on September 19, 2003.   DISCHARGE MEDICATIONS:  1. Parlodel 5 mg p.o. t.i.d.  2. Verapamil 250 mg daily.  3. Lisinopril 40 mg daily.  4. Loxapine 30 mg daily.  5. Lorazepam 0.5 mg daily.  6. Triampterine HCTZ 75 mg daily.  7. Metformin 500 mg p.o. b.i.d.   The patient is to contact our office for followup over the next two weeks.  Further followup with neurologist as indicated.                                                Eric L. August Saucer, M.D.    ELD/MEDQ  D:  10/30/2003  T:  11/01/2003  Job:  [id number]

## 2011-01-01 NOTE — Consult Note (Signed)
[name], [name]                        ACCOUNT NO.:  [id number]   MEDICAL RECORD NO.:  [id number]                   PATIENT TYPE:  INP   LOCATION:  3727                                 FACILITY:  [facility]   PHYSICIAN:  Deanna Artis. Sharene Skeans, M.D.           DATE OF BIRTH:  06/11/1951   DATE OF CONSULTATION:  09/18/2003  DATE OF DISCHARGE:                                   CONSULTATION   CHIEF COMPLAINT:  Syncope.   HISTORY OF PRESENT ILLNESS:  I was asked by Dr. August Odonnell to [name], a 60-  year-old, right-handed, married, African-American gentleman who was admitted  today for evaluation of syncope.   According to his wife, the patient has had problems with syncope over the  past 25 years.  The episodes sometimes have had prodrome of nausea,  queasiness, and light-headedness that would suggest neuro cardiogenic  syncope, but most of the episodes have occurred without any warning.  In  most cases he is sitting or recumbent and then stands and tries to walk  before having light-headedness and passing out.  She cannot remember a time  when he was engaged in active physical behavior during which time he felt  light-headed or passed out.   Last night, the patient had gone to bed feeling well.  He got up to go to  the bathroom and she heard him fall.  She came to him.  He was out for less  than a minute.  He tried to get up and then slumped again, and this happened  one more time before she was able to get him up.  Once he was able to walk,  he was somewhat unsteady on his feet.  He was able to walk under his own  power or she never would have been able to get him up because he is a large  man.  She felt that his right eyelid was drooped and that he was unsteady on  his feet.   The patient apparently has been evaluated for syncope in the past with no  etiology discerned.   The patient has no memory for the events of last night.  He has not had any  episodes of palpitations or  chest pain.  He has not had diaphoresis or  vomiting in association with these.  The patient has chronic headache  disorder and had a headache much of the day, but has not experienced an  episode of syncope followed by severe headache as would be expected with  vascular migraine.   He has not had any known cardiac disease or cardiac arrhythmia.   PAST MEDICAL HISTORY:  1. Remarkable for hypertension.  2. Non-insulin dependent diabetes mellitus.  3. Anxiety.  4. Depression.  5. 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  It was said not to  be impinging     upon the optic chiasm.  In November 2004, he had another MRI which showed     that the pituitary that was impinging upon the optic chiasm.  There was     evidence of diffuse enhancement.  There was no sign of a microadenoma or     truly a macroadenoma.  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   The patient does not show any signs of acromegaly.   PAST SURGICAL HISTORY:  1. Left total knee replacement.  2. He has severe arthritis as his right leg recommendations have been made     to carry out a knee operation there as well.   REVIEW OF SYSTEMS:  CONSTITUTIONAL:  The patient has been fasting a  religious fast.  By this, he has refrained from eating sweets, meat, and  only had soups after dinner.  This has been going on for about a month.  He  has only lost a few pounds.  He has had problems with angina.  It is unknown  if he had true cardiac arrhythmia.  He has had some shortness of breath in  the past.  He complained of some numbness in the right foot, but on  objective examination that could not be substantiated.  The patient has  significant osteoarthritis that has affected his knees.  He has had a  previous sleep study for excessive  daytime somnolence.  Etiology of this is  unclear.   CURRENT MEDICATIONS:  1. Hydrochlorothiazide.  2. Triamterene 1/2 tablet per day.  3. Bromocriptine 5 mg three times a day.  4. Lorazepam 0.5 mg per day.  5. Amitriptyline 25 to 50 mg at nighttime.  6. Verapamil 240 mg per day.  7. Lisinopril 40 mg per day.  8. Metformin 500 mg b.i.d.  9. Paroxetine 30 mg per day.  10.      He has some eye drops for dry eyes.   ALLERGIES:  He is intolerant to SULFA MEDICATIONS.   SOCIAL HISTORY:  He lives at home with his wife.  He has been married for  over 25 years.  He has a son who is a patient of mine who had migraines.  The patient does not use tobacco or alcohol.   PHYSICAL EXAMINATION:  GENERAL:  This is an obese, very pleasant, right-  handed African-American gentleman in no acute distress.  VITAL SIGNS:  Blood pressure 101/67, resting pulse 66, respirations 20,  temperature 98.1, height 6 feet 1-1/2 inches, weight was not measured, pulse  oximetry 97%, capillary glucose 84.  HEENT:  No signs of infection.  Neck is supple, full range of motion.  No  cranial or cervical bruits.  He has mild tenderness in the orbits, the  supraorbital region and the temples, the temporomandibular joints, and the  craniocervical junction.  None of this is severe.  LUNGS:  Clear to auscultation.  HEART:  No murmurs, pulses normal.  ABDOMEN:  Soft, protuberant, bowel sounds normal, no hepatosplenomegaly.  EXTREMITIES:  Well formed without edema, cyanosis, alterations in tone, or  tight heel cords.  NEUROLOGIC:  The patient is awake, alert, attentive, appropriate, normal  memory, orientation, language.  Fund of knowledge is not extensively probed.  Cranial nerves:  Round reactive pupils.  Normal fundi.  Visual fields full  to double  simultaneous stimuli.  Extraocular movements full and conjugate. Symmetric facial strength.  Midline tongue and uvula.  Air conduction  greater than bone conduction.  Motor  examination:  Normal strength, tone,  and mass, good fine motor movements, no pronator drift.  Sensation shows a  stocking and glove neuropathy to cold and vibration.  Proprioception also is  slightly decreased.  The patient had good stereoagnosis.  Cerebellar  examination:  Good finger-to-nose, rapid repetitive movements.  Gait was a  little tentative, but he could walk a tandem, get up on his heels and toes,  Romberg was negative, deep tendon reflexes were brisk.  Biceps, triceps,  patella diminished absent brachial radialis and ankles.  The patient had  bilateral flexor plantar responses.   IMPRESSION:  1. Syncope, unknown etiology, 780.2.  The patient has had orthostatic     symptoms in the past.  These may represent just orthostatic or vasovagal     episodes.  The patient has had neurally mediated vasodepressor symptoms     in the past as well, but the episodes that he just had were more likely     related to orthostatic symptoms plus a vasovagal condition.  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  He is not showing it in any other way.  2. Headaches, 784.0.  This are non-descript and may be muscular contraction     headaches.  In the past, he has had symptoms to migraines.  Indeed, his     son had migraines.  At present, since these are cured by rest or over-the-     counter medications, I do not think that we are dealing with migraine.  3. 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  It is curious that he has     such well preserved proximal reflexes in that setting.   PLAN:  The patient needs an EEG.  He has had other appropriate workup,  including ongoing telemetry and EKG.  I do not think that he needs a tilt  table test. He did have a carotid Doppler which was normal.  I do not think  that we need to repeat his MRI scan because it was just done in November.  I  appreciate the opportunity to participate in his care.  If you have any  questions or if I can be of  assistance, do not hesitate to contact me.                                               Deanna Artis. Sharene Skeans, M.D.    [facility]  D:  09/18/2003  T:  09/18/2003  Job:  [id number]

## 2011-01-01 NOTE — H&P (Signed)
[name], [name]              ACCOUNT NO.:  [id number]   MEDICAL RECORD NO.:  [id number]          PATIENT TYPE:  INP   LOCATION:  2032                         FACILITY:  [facility]   PHYSICIAN:  Eric L. August Saucer, M.D.     DATE OF BIRTH:  1950-11-07   DATE OF ADMISSION:  09/25/2004  DATE OF DISCHARGE:                                HISTORY & PHYSICAL   CHIEF COMPLAINT:  Syncopal spell.   HISTORY OF PRESENT ILLNESS:  This is one of several [facility]  admissions for this 60 year old married black male with a longstanding  history of hypertension, diabetes mellitus with depression who was doing  well until the morning of admission.  Patient relates he had been feeling  well until approximately 1:30 a.m.  He awakened during that time feeling  nauseated.  Patient went to the restroom.  States that the last he  remembers.  Wife reports hearing him fall.  When she found him, he was still  awake though extremely lethargic.  She noted that he had a staring affect  and initially would not respond.  She did assist him to the toilet itself  where he did have a bowel movement thereafter.  There was no urinary  incontinence or tongue biting.  She did note that he became somewhat tense  on the right side but resolved spontaneously.  The wife called EMS  thereafter.  Patient was brought to the emergency room for evaluation and  admitted thereafter.  Patient notably has a history of a similar event  approximately 1 year ago.  This however, was not preceded by nausea.  Evaluation at that time did not reveal a significant cardiac or neurological  cause for his activities.   PAST MEDICAL HISTORY:  Past medical history is remarkable for hypertension.  He had a previous history of a borderline large pituitary gland for which he  had been treated by neurology with bromocriptine for many years.  Has a  history of diabetes mellitus initially treated with oral agents and sliding  scale insulin.  He had  most recently done well with dietary modification and  weight reduction.   Past history otherwise significant for depression which was exacerbated a  [employer], [employer].  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   PAST SURGICAL HISTORY:  Past surgical history significant for cervical disk  disease with repair in April 2005; left knee replacement in 2003.  Patient  is status post right shoulder repair as well.   REVIEW OF SYSTEMS:  As noted above.  Patient has had recurrent problems with  allergic rhinitis with bouts of sinusitis/otitis media in the past.  He has  a history of intermittent shoulder and chest wall pain associated with  myositis in the past.  He has had persistently mild elevated CPK's.  He does  have a family history of muscle dystrophy in a father as well.  Questionable  history of multiple sclerosis in the same parent.   Review of systems otherwise notable for heavy snoring with pauses in sleep.  He did have a sleep study done approximately a year ago which was not  significant for sleep apnea at that time.  Wife does note that his symptoms  have worsened recently.   FAMILY HISTORY:  Family history is otherwise as noted above.  Positive  angina in mother, Alzheimer's disease.  Questionable muscle dystrophy in  father as noted.   HABITS:  Patient does not smoke or drink.   PRESENT MEDICATIONS:  1.  Bromocriptine 5 mg b.i.d.  2.  Paroxetine 30 mg once daily.  3.  Triamterene/hydrochlorothiazide 25 mg once daily.  4.  Amitriptyline 25 mg p.o. at bedtime.  5.  Verapamil 240 mg p.o. once daily.  6.  Artificial Tears p.r.n.   PHYSICAL EXAMINATION:  GENERAL:  He is a well-developed, well-nourished  black male presently in no acute distress.  Height 6 feet 1 inch, weight 259  pounds.  Blood pressure 128/73, pulse 67, respiratory rate 20, temperature  98.2.  HEENT:  Head normocephalic, atraumatic without bruits.   Extraocular muscles  are intact though patient does complain of vertigo on right and left lateral  gaze.  He does have left frontal and left maxillary sinus tenderness to  palpation.  TMs notable for left TM being ceruminous,  right TM with mild  erythematous changes.  Nose mild turbinate edema bilaterally.  Throat/posterior pharynx clear.  Neck supple, no enlarged thyroid.  No  carotid bruits.  LUNGS:  His lungs are clear to auscultation and percussion without CVA  tenderness.  No E/A changes.  CARDIOVASCULAR EXAM:  He has a normal S1, S2, no S3 or S4.  No murmur or  rubs appreciated at this time.  ABDOMEN:  Bowel sounds are present, no enlarged liver or spleen, masses or  tenderness.  EXTREMITIES:  He has tenderness in the right knee with minimal swelling.  No  increased warmth.  There is crepitation on passive range of motion.  Mildly  positive meniscus sign on the right.  There is a well-healed surgical scar  on the left knee status post arthroplasty.  Negative Homans' or edema  bilaterally.  Pulses are intact.  SKIN:  Without active lesions.  Patient has a 1.5 fullness in the right  axillary area.  Area is not firm, nontender.  NEUROLOGICAL:  He is alert, oriented x3.  Cranial nerves II-XII are intact.  He does have mild nystagmus on lateral gaze bilaterally with complaints of  dizziness.  No nausea.  Negative drift.  Strength is 4+/5 on the right, 5/5  on the left with gross testing.  Absent Babinski's bilaterally.   LABORATORIES:  CBC revealed WBC of 10,100, hemoglobin 14.9, hematocrit 45.8,  MCV of 74.7.  Chemistries:  Sodium 137, potassium 3.6, chloride 104, BUN of  21, glucose of 116.  SGOT 32, SGPT 38, alkaline phosphatase 35, albumin 3.6.  CK 442 with MB of 1.7.  Calcium 9.2.  Drugs of substance abuse was negative.  Stool for occult blood is negative as well.   IMPRESSION:  1.  Presyncope versus syncopal episode of questionable etiology.  Rule out     secondary to  arrhythmia versus seizure disorder.  Wife gives history of      some staring spells after the event with the patient having little      recall of what happened.  2.  Longstanding history of hypertension.  3.  Diabetes mellitus.  4.  Rule out underlying coronary artery disease.  Patient of note has vague  complaints of chest discomfort intermittently as well.  5.  Rule out sleep apnea and/or progression.  6.  Depression with associated anxiety disorder.  7.  Allergic rhinitis.  8.  Rule out sinusitis.  9.  Mild otitis media with vertigo on testing.  10. History of elevated CPK on chronic basis.  No previous documented heart      disease.  Family history of multiple dystrophy.   PLAN:  We will admit the patient for further evaluation acutely.  Place on  telemetry to exclude arrhythmias.  Cardiac enzymes q.8h. x3.  We will obtain  neurological opinion as well as cardiology opinion to sort out the cause  of his symptoms.  We will place him on antibiotics for treatment of his  sinuses at this time.  We will follow up pending results of the above.  He  will need a sleep study in the near future as well.  Doubt this will be able  to be done on inpatient basis at this time.  Further therapy thereafter.      ELD/MEDQ  D:  09/26/2004  T:  09/26/2004  Job:  [id number]

## 2011-01-01 NOTE — Op Note (Signed)
[name], [name]                        ACCOUNT NO.:  [id number]   MEDICAL RECORD NO.:  [id number]                   PATIENT TYPE:  OIB   LOCATION:  3035                                 FACILITY:  [facility]   PHYSICIAN:  Clydene Fake, M.D.               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   POSTOPERATIVE DIAGNOSIS:  Herniated nucleus pulposus and spondylosis C5-6  and C6-7 with left-sided radiculopathy and myelopathy.   PROCEDURE:  Intracervical decompression diskectomy and fusion C5-6 and C6-7  with __________ allograft bone and tether anterior cervical plate.   SURGEON:  Clydene Fake, M.D.   ASSISTANT:  Hilda Lias, M.D.   ANESTHESIA:  General endotracheal tube anesthesia.   ESTIMATED BLOOD LOSS:  Minimal.   BLOOD GIVEN:  None.   DRAINS:  None.   COMPLICATIONS:  None.   INDICATIONS FOR PROCEDURE:  The patient is a 60 year old gentleman who has  had neck pain and left arm pain, numbness, and weakness, also some trouble  with instability of gait with walking, maybe more on the left side.  MRI was  done showing large disk rupture paracentrally and out laterally in the left  C5-6 with cord and root compression on the left side and spondylitic change  and canal stenosis and biforaminal stenosis at C6-7.  The patient brought in  for decompression and fusion at these levels.   DESCRIPTION OF PROCEDURE:  The patient was brought into the operating room  and general anesthesia was induced.  The patient was placed in 10 pounds of  halter traction, prepped and draped in a sterile fashion.  The incision was  injected with 10 mL of 1% lidocaine with epinephrine.  The incision was then  made from the midline to the anterior border of the  sternocleidomastoid  muscle on the left side and neck incision taken down to the platysma and  hemostasis obtained with Bovie cauterization.  Bovie was used to incise the  latissimus and blunt dissection was taken through the anterior cervical  fascia of the anterior cervical spine.  A needle was placed in the  interspace and x-ray was obtained showing this is at C5-6 interspace.  This  interspace was incised with the 15 blade and partial diskectomy performed  with pituitary rongeurs and osteophyte tool.  As the needle was removed the  longus coli muscle was reflected laterally on each side from C5-7 and then a  self-retaining retractor system was placed.  The 5-6 and 6-7 disk spaces  were then again incised with 15 blade and partial diskectomy performed,  pituitary rongeurs and distraction pins were placed in the C5 and 7 and the  interspaces distracted.  Microscope was brought in for  microdissection at 5-  6.  Diskectomy was continued with curets and Kerrison punches.  A high-speed  drill was used to remove osteophytes and cartilaginous end plate and the  uncovertebral joints.  Then, 1 and 2-mm Kerrison punches were then used to  remove posterior ligaments and osteophytes, thickened ligaments.  This was  all removed, a disk herniation centrally and then to the left was found.  This was also removed.  Bilateral foraminotomies were performed with good  central and lateral decompression.  The disk space was measured with the  __________  trials, measured to be 7 mm.  A 7-mm bone graft was then tapped  into place.  __________  a couple of millimeters rechecked posterior to the  graft and there was plenty of room between graft and dura.   Attention was then taken to the 6-7 level.  Diskectomy performed with  pituitary rongeurs and curets.  Then, 1 and 2-mm Kerrison punches were used  to remove posterior osteophytes, posterior ligament, and to perform  bilateral foraminotomies.  High-speed  drill was then used to remove  cartilaginous end plates.  When we were finished we had good decompression  of the central canal and bilateral foramen over the roots.  However, disk  space was measured to be 7 mm and 7-mm __________  graft was tapped into  place, counter sunk a millimeter.  There was plenty of room between the  posterior edges of bone graft and dura checked with a hook.  At this point  distraction pins were removed and the weight was removed from the traction.  Bone plug stayed in good position and were tightly in place.  Osteophytes  were removed with an osteophyte Leksell rongeur.  A tether at the anterior  cervical plate was placed over the anterior cervical spine, two screws  placed in the C5, one in the C6, and two in the C7.  These were tightened  down.  Lateral x-rays were obtained showing good position of screws and bone  plug at C5 and 6.  We could not see the bottom of the plate or the screws at  7.  Intraoperative everything looked in good position.  The wound was  irrigated with antibiotic solution.  Retractors were removed.  Hemostasis  was obtained with Gelfoam and thrombin and bipolar cauterization.  Gelfoam  was then irrigated out and when we had very good hemostasis the __________  closed with 3-0 Vicryl interrupted suture, the subcutaneous tissue was  closed with the same and the skin closed with benzoin and Steri-Strips.  Dressing was placed.  The patient was then awakened from anesthesia and  transported to the recovery room in stable condition.                                               Clydene Fake, M.D.    [facility]/MEDQ  D:  12/12/2003  T:  12/12/2003  Job:  [id number]

## 2011-01-01 NOTE — Discharge Summary (Signed)
[name], [name]              ACCOUNT NO.:  [id number]   MEDICAL RECORD NO.:  [id number]          PATIENT TYPE:  INP   LOCATION:  5729                         FACILITY:  [facility]   PHYSICIAN:  Eric L. August Saucer, M.D.     DATE OF BIRTH:  1950/10/28   DATE OF ADMISSION:  10/14/2004  DATE OF DISCHARGE:  10/16/2004                                 DISCHARGE SUMMARY   FINAL DIAGNOSES:  1.  Cervical spondylosis, 721.0.  2.  Malaise and fatigue, 780.79.  3.  Diabetes mellitus, type 2, 250.00.  4.  Hypertension, 401.9.  5.  Myalgia and myositis, 729.1.  6.  Joint pains of the hands, 719.44.  7.  Joint pains of the ankles, 719.47.  8.  Depressive disorder, 311.   OPERATIONS AND PROCEDURES:  None.   HISTORY OF PRESENT ILLNESS:  The patient is a 60 year old married black male  with a history of hypertension and diabetes mellitus, with a recent history  of syncopal spells, who was admitted complaining of right-sided weakness and  headaches.  On the morning of admission, the patient awoke with a fullness  in the right arm and leg.  The patient noted weakness in the right leg for  several hours with unsteadiness in his gait.  There was no true vertigo,  nausea, vomiting or blurred vision.  The patient developed sharp frontal  headaches with the above symptoms.  His symptoms did persist and the family  became concerned and he was subsequently admitted for further evaluation.   PAST MEDICAL HISTORY:  Past medical history is as noted above.  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 Phoebe Perch, distant history of  myositis with elevated CPK, resolved, and history of depression with  disability by the [employer] [facility] [employer] system.   MEDICATIONS AND PHYSICAL EXAM:  His medications and physical exam are as per  noted per admission H&P.   HOSPITAL COURSE:  The patient was admitted for further evaluation of  atypical  right-sided weakness of questionable etiology.  This was associated  with some atypical headaches as well.  A question of TIA versus atypical  migrating component versus other was entertained.  There is also a distant  history of sleep apnea as well.  A neurological consultation was obtained  with Dr. Pearlean Brownie.  The patient in the interim was placed on Ultram for acute  pain control.  He was also treated for clinical sinusitis.  Further  evaluation with MRI scan as noted with Neurology input was obtained.  This  demonstrated mild spondylosis at C2 and C3 with mild foraminal encroachment  on the left.  He also had moderate spondylosis at C3 and C4 with some  encroachment upon the neural foramina, worse on the left than the right.  It  was felt that the nerve roots could possibly be affected at that level as  well.  There was evidence for a spondylosis and a small central disk  herniation at C4 and 5 with some encroachment of the neural foramen on  the  left.  The patient was seen by Neurosurgery, which on further reviewed  showed that there were some increased changes at C7-T1 which would most  likely explain the patient's left-sided weakness.  It was felt that once the  patient was stable that he would need some decompression at C7 and T1, which  was felt to be relief of symptoms.  The patient at the time was on Plavix  and aspirin, and this was held thereafter.   The patient thereafter made gradual progress.  He still had some pain  involving his neck as well as knees with some adjustments of his pain  medication.  He was made more ambulatory, which he tolerated well.  Notably,  further serological workup including ANA, sed rate and rheumatoid factor  were all normal.  Uric acid was within normal limits as well.  Aldolase  subsequently was unremarkable as well.  The issue of sleep apnea was again  explored as well.  He had been noted in the past to have a mild degree of  this and it was felt  that this would need to be re-explored as well.  He had  had histories of some syncopal spells which were not clearly evaluated.   The patient also notably on October 16, 2004 developed a facial rash.  This,  however, was not consistent with a connective tissue change, based on the  above-noted results.  He was given a topical steroid cream.  This  subsequently resolved as well.  By October 16, 2004, he was feeling  considerably better.  It was felt that further workup could be pursued as an  outpatient.   MEDICATIONS AT TIME OF DISCHARGE:  1.  Paxil 40 mg p.o. daily.  2.  Amitriptyline 25 mg p.o. nightly.  3.  Verapamil 240 mg daily.  4.  Zyrtec 10 mg daily.  5.  Ambien 5 mg p.o. nightly p.r.n.  6.  Bromocriptine 5 mg t.i.d.  7.  Vitamin E 100 units b.i.d.  8.  Ultram 50 mg four times daily as needed for pain.   ACTIVITY:  No overhead lifting.   DIET:  The patient will be maintained on a 4-g sodium, no-concentrated-  sweets diet.   FOLLOWUP:  The patient is to follow up with the neurosurgeon as instructed.  Appointment with Dr. August Saucer in 2 weeks' time.      ELD/MEDQ  D:  12/23/2004  T:  12/24/2004  Job:  [id number]

## 2011-01-01 NOTE — Procedures (Signed)
HISTORY OF PRESENT ILLNESS:  The patient is a 60 year old who has had  multiple episodes of syncope.  The study is being done to look for the  presence of a seizure focus.  There is a family history of migraine  headaches.  The patient has had episodes of neural cardiogenic syncope but  this episode was a trio of behaviors associated with unresponsiveness  without seizure activity and without any patient recollection.   DESCRIPTION OF PROCEDURE:  The tracing was carried out on a 32-channel  digital Cadwell recorder reformatted in to 16 channel montages with one them  EKG.  The patient was awake during the recording and drowsy.  The  international 10/20 system lead placement was used.   Medications include Metformin, amitriptyline, a variety of any hypertensive  medications.   FINDINGS:  Dominant frequency is a 10 hertz, 10-15 mcV activity.  This is  all regulated and attenuates briskly with eye opening.  Background activity  is basically under 10 mcV and consists of a mixture of alpha and  predominantly beta range activity.  Occasional theta range components were  seen.  The patient has some episodes of drowsiness during the record with  mixed frequency of beta and delta range activity.  Light natural sleep was  not achieved.  There was no focal slowing.  There was no intra-ictal  epileptiform activity in the form of spikes or sharp waves.  Intermittent  photo stimulation failed to induce a definite driving response.  Hyperventilation was not carried out.  EKG showed regular sinus rhythm with  ventricular response of 63 beats per minute.   IMPRESSION:  Normal record in the waking state and drowsiness.    WILLIAM H. Sharene Skeans, M.D.   UUV:OZDG  D:  09/18/2003 18:36:25  T:  09/18/2003 20:23:44  Job #:  [id number]   cc:   Minerva Areola L. August Saucer, M.D.  [address] [address]  [place]  [name] [address]  Fax: [phone]

## 2011-01-01 NOTE — H&P (Signed)
[name], [name]                        ACCOUNT NO.:  [id number]   MEDICAL RECORD NO.:  [id number]                   PATIENT TYPE:  INP   LOCATION:  3727                                 FACILITY:  [facility]   PHYSICIAN:  Eric L. August Saucer, M.D.                  DATE OF BIRTH:  09-21-1950   DATE OF ADMISSION:  09/18/2003  DATE OF DISCHARGE:                                HISTORY & PHYSICAL   CHIEF COMPLAINT:  Recurrent syncope, new onset.   HISTORY OF PRESENT ILLNESS:  This is the first recent [facility]  admission for this 60 year old married black male with longstanding history  of hypertension, mixed vascular headaches, with history of documented  pituitary enlargement.  The patient had been doing fairly well up until one  day prior to admission.  The wife reports that he had complained of a vague  headache most of that day.  He described the headache as being dull, left  posterior in nature.  He also noted fullness in his frontal sinus regions as  well.  That night, the wife reported that he snored as usual, but there was  no associated change in his breathing pattern, no apnea spell noted.  Approximately 1:26 a.m., the patient apparently got up to go to the  bathroom.  The patient thereafter collapsed and fell.  Wife assisted patient  up, and he subsequently had another presyncopal event.  She notes that on  the second time, he did go back and hit the back of his head.  He did lose  consciousness for greater than a minute per family though not actually  documented.  There was no associated seizure activity, no urinary or fecal  incontinence.  She did note that he looked somewhat confused with some  drooping of the right side of his face which was very transient.  She noted  that he since that time had been more subdue for most of the day and not as  alert.  She states that he did sleep a great deal during that time.  The  patient did not feel any better today.  Wife  contacted our office, and he  was subsequently admitted for further evaluation.   The patient notably has no recollection of the event otherwise.  He denies  any recent problems with chest pain, palpitations, or significant shortness  of breath.  There is a questionable history of a similar episode in November  with a syncopal spell.  The patient has otherwise done well.   History is significant also for him having recently been on a fast over the  past month.  His blood sugars have done well.  He denied any lightheadedness  or dizzy spells during this time.   PAST MEDICAL HISTORY:  1. 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  He has been followed by [facility] as  well.  2. The patient has history of longstanding headaches which have been     described as mixed vascular in nature.  He has been seen by several     neurology groups in the past with Dr. Buzzy Han, most recently by Dr.     Meryl Crutch.  He also is followed at the [facility].  He recently did have an     MRI scan done of his head on November 15.  This showed prominent size of     pituitary without actual adenoma being definitely identified.  It was     otherwise negative for recent infarcts, plaques, or other abnormalities.  3. Hypertension.  4. Diabetes mellitus.  5. Allergic rhinitis, recurrent sinusitis.  6. Chronic problems in the past with significant degenerative joint disease     of the knees for which he is status post left arthroplasty as well.   There has been no documented history of significant coronary artery disease  or significant arrhythmias in the past.   ALLERGIES:  SULFA.   CURRENT MEDICATIONS:  1. Parlodel 5 mg p.o. t.i.d.  2. Verapamil 240 mg daily.  3. Lisinopril 40 mg daily.  4. Paroxetine 30 mg daily.  5. Lorazepam 0.5 mg daily.  6. Triamterene/hydrochlorothiazide 75 mg daily.  7. Metformin 500 mg p.o. b.i.d.   REVIEW OF SYSTEMS:  As noted above.  In  addition, he has had occasional  complaints of the right leg feeling weak at time with some dysesthesias.  He  feels as if his left leg has occasionally been asleep upon awakening.  There  has been no loss of balance or legs giving way.   SOCIAL HISTORY:  The patient is disabled, father of two.  Previous Social worker with significant anxiety disorder with question of posttraumatic  stress disorder.  Wife reports that he has emotionally been more stable  recently than he has in an extended period of time.  He has otherwise been  doing well.   PHYSICAL EXAMINATION:  GENERAL:  Well-developed, well-nourished black male  in no acute distress.  VITAL SIGNS:  Height 6 feet 1-1/2 inches, weight not documented.  Temperature 98.1, blood pressure 101/67, pulse 66, respiratory rate 20.  Blood sugar at the time of admission was 84.  HEENT:  Head normocephalic and atraumatic.  He does have complaints of  vertigo on bilateral lateral gaze and upper gaze.  There is frontal sinus  tenderness bilaterally.  Mild maxillary sinus tenderness.  Nose has mild  turbinate edema bilaterally.  TMs have decreased light reflex versus right.  No cranial bruits appreciated.  Throat and posterior pharynx clear.  NECK:  Supple.  No enlarged thyroid.  No carotid bruits noted.  No posterior  cervical nodes.  LUNGS:  Clear without wheezes or rales.  No changes.  CARDIOVASCULAR:  Normal S1 and S2.  No S3.  No significant murmur or rub  appreciated at this time.  ABDOMEN:  Bowel sounds present.  No enlarged liver or spleen, mass, or  tenderness.  GU:  Deferred at this time.  MUSCULOSKELETAL:  Ful range of motion in upper extremities.  He has mild  tenderness in the right lateral condyle region.  He has a well-healed  surgical scar in the left knee.  Good passive range of motion.  Negative  Homan's, no edema.  Pulses are 1+ bilaterally. NEUROLOGIC:  Alert and oriented to person, place, and time rapid alternating   movements bilaterally.  Negative drift.  There is  4+/5 strength bilaterally  on dorsiflexion.  Absent Babinski bilaterally.   LABORATORY DATA:  CBC revealed WBC of 6800, hemoglobin 14.3, hematocrit  44.2, platelets 260,000.  MCV 75.8.  Chemistries:  Sodium 140, potassium  4.0, chloride 105, CO2 29, BUN 11, creatinine 1.3, glucose 75.  Total  protein 6.8, albumin 3.9.  LFTs  within normal limits.  CPK elevated at 313.  Troponin 0.01, CK-MB 1.7  within normal limits.  Electrolytes 9.5 calcium.   Carotid Dopplers were done which showed no significant plaques.   IMPRESSION:  1. Recurrent syncopal spells x 3, questionable etiology.  Rule out vasovagal     reaction.  Rule out atypical seizures versus other.  The patient has no     documented history of arrhythmias, but cannot exclude this.  Notably, he     recently had been on a fast with a documented weight loss.  Blood     pressure is running lower than his baseline.  2. History of atypical headaches with history of mixed vascular headaches in     the past.  MRI scan shows recent evidence for diffuse enlargement of     pituitary without demonstrative change from before.  No other lesions     seen by MRI scan from November.  3. History of fall with head trauma.  Rule out subdural though doubtful.  4. Rule out arrhythmia in patient with history of hypertension but without     documented history of arrhythmias in the past.  5. History of diabetes mellitus.  6. Allergic rhinitis.  7. History of depression with posttraumatic stress disorder.  8. 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   PLAN:  1. The patient is admitted for 24-hour observation.  2. He will be placed on telemetry.  3. Will rule out arrhythmias.  4. Carotid Dopplers have been done as noted.  5. CT scans.  6. Will obtain neurological consultation.  7. Will hold his diuretic at this time with orthostatic blood pressure     checks.  8. Further  therapy pending results of the above.                                                Eric L. August Saucer, M.D.    ELD/MEDQ  D:  09/18/2003  T:  09/18/2003  Job:  [id number]

## 2011-01-01 NOTE — Procedures (Signed)
REFERRING PHYSICIAN:  Willey Blade, M.D.   CLINICAL HISTORY:  [age]-year-old male with episode of passing out.  Medications:  Verapamil, Paxil, amitriptyline, metformin, bromocriptine and  triamterene/hydrochlorothiazide.   This is a 17-channel EEG performed during wakeful states using 10/20  electrode placement.  Background awake rhythm consists of 7-8 Hz alpha which  is of moderate amplitude reactive to eye opening and closure.  No paroxysmal  epileptiform activity, spikes or sharp waves are seen.  Sleep stages are not  achieved except mild drowsiness which is uneventful.  Photic stimulation  results in no significant abnormalities.  Hyperventilation was not  performed.  Length of the tracing is 20.6 minutes.  Technical component is  adequate.  EKG tracing reveals regular sinus rhythm.   IMPRESSION:  This EEG performed during wakeful and drowsy states is within  normal limits.  No definite epileptiform activity are identified.      ZOX:WRUE  D:  09/28/2004 17:10:53  T:  09/28/2004 18:52:57  Job #:  [id number]   cc:   Minerva Areola L. August Saucer, M.D.  [address] [address]  [place]  [name] [address]  Fax: [phone]

## 2011-05-20 LAB — CBC
HCT: 33.8 % — ABNORMAL LOW (ref 39.0–52.0)
Hemoglobin: 10.7 g/dL — ABNORMAL LOW (ref 13.0–17.0)
Hemoglobin: 10.9 g/dL — ABNORMAL LOW (ref 13.0–17.0)
Hemoglobin: 11.5 g/dL — ABNORMAL LOW (ref 13.0–17.0)
Hemoglobin: 13.6 g/dL (ref 13.0–17.0)
MCHC: 31.1 g/dL (ref 30.0–36.0)
MCHC: 31.4 g/dL (ref 30.0–36.0)
MCHC: 31.5 g/dL (ref 30.0–36.0)
MCHC: 32.1 g/dL (ref 30.0–36.0)
MCV: 76.4 fL — ABNORMAL LOW (ref 78.0–100.0)
MCV: 77.2 fL — ABNORMAL LOW (ref 78.0–100.0)
Platelets: 187 10*3/uL (ref 150–400)
RBC: 4.53 MIL/uL (ref 4.22–5.81)
RBC: 4.68 MIL/uL (ref 4.22–5.81)
RBC: 5.64 MIL/uL (ref 4.22–5.81)
RDW: 14.5 % (ref 11.5–15.5)
RDW: 14.6 % (ref 11.5–15.5)
RDW: 15.3 % (ref 11.5–15.5)
WBC: 8.8 10*3/uL (ref 4.0–10.5)

## 2011-05-20 LAB — BASIC METABOLIC PANEL
BUN: 13 mg/dL (ref 6–23)
CO2: 26 mEq/L (ref 19–32)
CO2: 27 mEq/L (ref 19–32)
Calcium: 8.2 mg/dL — ABNORMAL LOW (ref 8.4–10.5)
Calcium: 8.3 mg/dL — ABNORMAL LOW (ref 8.4–10.5)
Calcium: 8.3 mg/dL — ABNORMAL LOW (ref 8.4–10.5)
Chloride: 100 mEq/L (ref 96–112)
Creatinine, Ser: 1.26 mg/dL (ref 0.4–1.5)
GFR calc Af Amer: >60 mL/min (ref >60)
GFR calc Af Amer: >60 mL/min (ref >60)
GFR calc non Af Amer: 59 mL/min — ABNORMAL LOW (ref >60)
Glucose, Bld: 154 mg/dL — ABNORMAL HIGH (ref 70–99)
Glucose, Bld: 97 mg/dL (ref 70–99)
Sodium: 134 mEq/L — ABNORMAL LOW (ref 135–145)
Sodium: 135 mEq/L (ref 135–145)
Sodium: 136 mEq/L (ref 135–145)

## 2011-05-20 LAB — URINALYSIS, ROUTINE W REFLEX MICROSCOPIC
Glucose, UA: NEGATIVE mg/dL
Nitrite: NEGATIVE
Specific Gravity, Urine: 1.017 (ref 1.005–1.030)
pH: 7 (ref 5.0–8.0)

## 2011-05-20 LAB — GLUCOSE, CAPILLARY
Glucose-Capillary: 101 mg/dL — ABNORMAL HIGH (ref 70–99)
Glucose-Capillary: 105 mg/dL — ABNORMAL HIGH (ref 70–99)
Glucose-Capillary: 116 mg/dL — ABNORMAL HIGH (ref 70–99)
Glucose-Capillary: 119 mg/dL — ABNORMAL HIGH (ref 70–99)
Glucose-Capillary: 122 mg/dL — ABNORMAL HIGH (ref 70–99)
Glucose-Capillary: 145 mg/dL — ABNORMAL HIGH (ref 70–99)
Glucose-Capillary: 93 mg/dL (ref 70–99)

## 2011-05-20 LAB — COMPREHENSIVE METABOLIC PANEL
AST: 27 U/L (ref 0–37)
Albumin: 4 g/dL (ref 3.5–5.2)
Chloride: 102 mEq/L (ref 96–112)
Creatinine, Ser: 1.2 mg/dL (ref 0.4–1.5)
GFR calc Af Amer: >60 mL/min (ref >60)
Total Bilirubin: 0.8 mg/dL (ref 0.3–1.2)
Total Protein: 6.9 g/dL (ref 6.0–8.3)

## 2011-05-20 LAB — CROSSMATCH

## 2011-05-20 LAB — DIFFERENTIAL
Basophils Absolute: 0 10*3/uL (ref 0.0–0.1)
Eosinophils Relative: 2 % (ref 0–5)
Lymphocytes Relative: 25 % (ref 12–46)
Lymphs Abs: 1.5 10*3/uL (ref 0.7–4.0)
Monocytes Absolute: 0.4 10*3/uL (ref 0.1–1.0)
Monocytes Relative: 7 % (ref 3–12)

## 2011-05-20 LAB — HEMOGLOBIN A1C: Hgb A1c MFr Bld: 6.7 % — ABNORMAL HIGH (ref 4.6–6.1)

## 2011-05-20 LAB — URINE CULTURE
Colony Count: NO GROWTH
Culture: NO GROWTH

## 2011-05-20 LAB — APTT: aPTT: 29 seconds (ref 24–37)

## 2011-05-20 LAB — ABO/RH: ABO/RH(D): O POS

## 2011-06-23 ENCOUNTER — Other Ambulatory Visit: Payer: Self-pay | Admitting: Neurosurgery

## 2011-06-23 DIAGNOSIS — M48061 Spinal stenosis, lumbar region without neurogenic claudication: Secondary | ICD-10-CM

## 2011-06-23 DIAGNOSIS — M545 Low back pain: Secondary | ICD-10-CM

## 2011-06-23 DIAGNOSIS — M5410 Radiculopathy, site unspecified: Secondary | ICD-10-CM

## 2011-06-25 ENCOUNTER — Ambulatory Visit
Admission: RE | Admit: 2011-06-25 | Discharge: 2011-06-25 | Disposition: A | Discharge status: Home / Self Care | Payer: Medicare Other | Source: Ambulatory Visit | Attending: Neurosurgery | Admitting: Neurosurgery

## 2011-06-25 DIAGNOSIS — M5410 Radiculopathy, site unspecified: Secondary | ICD-10-CM

## 2011-06-25 DIAGNOSIS — M48061 Spinal stenosis, lumbar region without neurogenic claudication: Secondary | ICD-10-CM

## 2011-06-25 DIAGNOSIS — M545 Low back pain: Secondary | ICD-10-CM

## 2011-06-26 ENCOUNTER — Inpatient Hospital Stay: Admission: RE | Admit: 2011-06-26 | Payer: Medicare Other | Source: Ambulatory Visit

## 2011-08-23 ENCOUNTER — Other Ambulatory Visit: Payer: Self-pay | Admitting: Neurosurgery

## 2011-08-27 ENCOUNTER — Encounter (HOSPITAL_COMMUNITY): Payer: Self-pay | Admitting: Pharmacy Technician

## 2011-08-27 ENCOUNTER — Encounter (HOSPITAL_COMMUNITY)
Admission: RE | Admit: 2011-08-27 | Discharge: 2011-08-27 | Disposition: A | Discharge status: Home / Self Care | Payer: Medicare Other | Source: Ambulatory Visit | Attending: Anesthesiology | Admitting: Anesthesiology

## 2011-08-27 ENCOUNTER — Encounter (HOSPITAL_COMMUNITY): Payer: Self-pay

## 2011-08-27 ENCOUNTER — Encounter (HOSPITAL_COMMUNITY)
Admission: RE | Admit: 2011-08-27 | Discharge: 2011-08-27 | Disposition: A | Discharge status: Home / Self Care | Payer: Medicare Other | Source: Ambulatory Visit | Attending: Neurosurgery | Admitting: Neurosurgery

## 2011-08-27 ENCOUNTER — Other Ambulatory Visit: Payer: Self-pay

## 2011-08-27 HISTORY — DX: Depression, unspecified: F32.A

## 2011-08-27 HISTORY — DX: Other symptoms and signs involving the musculoskeletal system: R29.898

## 2011-08-27 HISTORY — DX: Disorder of pituitary gland, unspecified: E23.7

## 2011-08-27 HISTORY — DX: Anxiety disorder, unspecified: F41.9

## 2011-08-27 HISTORY — DX: Mental disorder, not otherwise specified: F99

## 2011-08-27 HISTORY — DX: Sleep apnea, unspecified: G47.30

## 2011-08-27 HISTORY — DX: Hypothyroidism, unspecified: E03.9

## 2011-08-27 HISTORY — DX: Major depressive disorder, single episode, unspecified: F32.9

## 2011-08-27 LAB — URINALYSIS, ROUTINE W REFLEX MICROSCOPIC
Ketones, ur: NEGATIVE mg/dL
Leukocytes, UA: NEGATIVE
Nitrite: NEGATIVE
Protein, ur: NEGATIVE mg/dL
Urobilinogen, UA: 0.2 mg/dL (ref 0.0–1.0)

## 2011-08-27 LAB — CBC
MCH: 24.9 pg — ABNORMAL LOW (ref 26.0–34.0)
MCV: 75.8 fL — ABNORMAL LOW (ref 78.0–100.0)
Platelets: 258 10*3/uL (ref 150–400)
RDW: 15.1 % (ref 11.5–15.5)

## 2011-08-27 LAB — DIFFERENTIAL
Basophils Absolute: 0 10*3/uL (ref 0.0–0.1)
Basophils Relative: 1 % (ref 0–1)
Eosinophils Absolute: 0.2 10*3/uL (ref 0.0–0.7)
Eosinophils Relative: 3 % (ref 0–5)

## 2011-08-27 LAB — BASIC METABOLIC PANEL
CO2: 30 mEq/L (ref 19–32)
Calcium: 9.9 mg/dL (ref 8.4–10.5)
Creatinine, Ser: 1.26 mg/dL (ref 0.50–1.35)
GFR calc non Af Amer: 60 mL/min — ABNORMAL LOW (ref >90)
Glucose, Bld: 105 mg/dL — ABNORMAL HIGH (ref 70–99)

## 2011-08-27 NOTE — Pre-Procedure Instructions (Signed)
20 [name]  08/27/2011   Your procedure is scheduled on: jan 15 Report to [facility] at 0530AM.  Call this number if you have problems the morning of surgery: [phone]   Remember:   Do not eat food:After Midnight.  May have clear liquids: up to 4 Hours before arrival.  Clear liquids include soda, tea, black coffee, apple or grape juice, broth.  Take these medicines the morning of surgery with A SIP OF WATER:bromocriptine,levothyroxine,fexofnadine,vrapamil  Do not wear jewelry, make-up or nail polish.  Do not wear lotions, powders, or perfumes. You may wear deodorant.  Do not shave 48 hours prior to surgery.  Do not bring valuables to the hospital.  Contacts, dentures or bridgework may not be worn into surgery.  Leave suitcase in the car. After surgery it may be brought to your room.  For patients admitted to the hospital, checkout time is 11:00 AM the day of discharge.   Patients discharged the day of surgery will not be allowed to drive home.  Name and phone number of your driver: [name] Special Instructions: CHG Shower Use Special Wash: 1/2 bottle night before surgery and 1/2 bottle morning of surgery.   Please read over the following fact sheets that you were given: Coughing and Deep Breathing, MRSA Information and Surgical Site Infection Prevention

## 2011-08-30 MED ORDER — CEFAZOLIN SODIUM-DEXTROSE 2-3 GM-% IV SOLR
2.0000 g | INTRAVENOUS | Status: AC
  Administered 2011-08-31: 2 g via INTRAVENOUS
  Filled 2011-08-30: qty 50

## 2011-08-31 ENCOUNTER — Inpatient Hospital Stay (HOSPITAL_COMMUNITY)
Admission: RE | Admit: 2011-08-31 | Discharge: 2011-09-01 | DRG: 491 | Disposition: A | Discharge status: Home / Self Care | Payer: Medicare Other | Source: Ambulatory Visit | Attending: Neurosurgery | Admitting: Neurosurgery

## 2011-08-31 ENCOUNTER — Ambulatory Visit (HOSPITAL_COMMUNITY): Payer: Medicare Other | Admitting: Anesthesiology

## 2011-08-31 ENCOUNTER — Encounter (HOSPITAL_COMMUNITY): Payer: Self-pay | Admitting: *Deleted

## 2011-08-31 ENCOUNTER — Encounter (HOSPITAL_COMMUNITY): Payer: Self-pay | Admitting: Anesthesiology

## 2011-08-31 ENCOUNTER — Encounter (HOSPITAL_COMMUNITY)
Admission: RE | Disposition: A | Discharge status: Home / Self Care | Payer: Self-pay | Source: Ambulatory Visit | Attending: Neurosurgery

## 2011-08-31 ENCOUNTER — Ambulatory Visit (HOSPITAL_COMMUNITY): Payer: Medicare Other

## 2011-08-31 DIAGNOSIS — M159 Polyosteoarthritis, unspecified: Secondary | ICD-10-CM | POA: Diagnosis present

## 2011-08-31 DIAGNOSIS — G4730 Sleep apnea, unspecified: Secondary | ICD-10-CM | POA: Diagnosis present

## 2011-08-31 DIAGNOSIS — M47817 Spondylosis without myelopathy or radiculopathy, lumbosacral region: Secondary | ICD-10-CM | POA: Diagnosis present

## 2011-08-31 DIAGNOSIS — J301 Allergic rhinitis due to pollen: Secondary | ICD-10-CM | POA: Diagnosis present

## 2011-08-31 DIAGNOSIS — F4310 Post-traumatic stress disorder, unspecified: Secondary | ICD-10-CM | POA: Diagnosis present

## 2011-08-31 DIAGNOSIS — I10 Essential (primary) hypertension: Secondary | ICD-10-CM | POA: Diagnosis present

## 2011-08-31 DIAGNOSIS — Z794 Long term (current) use of insulin: Secondary | ICD-10-CM

## 2011-08-31 DIAGNOSIS — Z888 Allergy status to other drugs, medicaments and biological substances status: Secondary | ICD-10-CM

## 2011-08-31 DIAGNOSIS — Z79899 Other long term (current) drug therapy: Secondary | ICD-10-CM

## 2011-08-31 DIAGNOSIS — Z882 Allergy status to sulfonamides status: Secondary | ICD-10-CM

## 2011-08-31 DIAGNOSIS — E119 Type 2 diabetes mellitus without complications: Secondary | ICD-10-CM | POA: Diagnosis present

## 2011-08-31 DIAGNOSIS — K219 Gastro-esophageal reflux disease without esophagitis: Secondary | ICD-10-CM | POA: Diagnosis present

## 2011-08-31 DIAGNOSIS — M48061 Spinal stenosis, lumbar region without neurogenic claudication: Principal | ICD-10-CM | POA: Diagnosis present

## 2011-08-31 HISTORY — PX: LUMBAR LAMINECTOMY/DECOMPRESSION MICRODISCECTOMY: SHX5026

## 2011-08-31 LAB — GLUCOSE, CAPILLARY
Glucose-Capillary: 108 mg/dL — ABNORMAL HIGH (ref 70–99)
Glucose-Capillary: 112 mg/dL — ABNORMAL HIGH (ref 70–99)
Glucose-Capillary: 108 mg/dL — ABNORMAL HIGH (ref 70–99)
Glucose-Capillary: 84 mg/dL (ref 70–99)

## 2011-08-31 SURGERY — LUMBAR LAMINECTOMY/DECOMPRESSION MICRODISCECTOMY
Anesthesia: General | Site: Spine Lumbar | Wound class: Clean

## 2011-08-31 MED ORDER — CYCLOBENZAPRINE HCL 10 MG PO TABS
10.0000 mg | ORAL_TABLET | Freq: Three times a day (TID) | ORAL | Status: DC | PRN
  Administered 2011-09-01: 10 mg via ORAL
  Filled 2011-08-31: qty 1

## 2011-08-31 MED ORDER — PROPOFOL 10 MG/ML IV EMUL
INTRAVENOUS | Status: DC | PRN
  Administered 2011-08-31: 10 mg via INTRAVENOUS

## 2011-08-31 MED ORDER — KETOROLAC TROMETHAMINE 30 MG/ML IJ SOLN
30.0000 mg | Freq: Four times a day (QID) | INTRAMUSCULAR | Status: AC
  Administered 2011-08-31 – 2011-09-01 (×4): 30 mg via INTRAVENOUS
  Filled 2011-08-31 (×4): qty 1

## 2011-08-31 MED ORDER — POTASSIUM CHLORIDE IN NACL 20-0.9 MEQ/L-% IV SOLN
INTRAVENOUS | Status: DC
  Filled 2011-08-31 (×4): qty 1000

## 2011-08-31 MED ORDER — VERAPAMIL HCL ER 180 MG PO TBCR
180.0000 mg | EXTENDED_RELEASE_TABLET | Freq: Every day | ORAL | Status: DC
  Administered 2011-09-01: 180 mg via ORAL
  Filled 2011-08-31 (×2): qty 1

## 2011-08-31 MED ORDER — SODIUM CHLORIDE 0.9 % IJ SOLN: 3.0000 mL | INTRAMUSCULAR | Status: DC | PRN

## 2011-08-31 MED ORDER — BROMOCRIPTINE MESYLATE 2.5 MG PO TABS
5.0000 mg | ORAL_TABLET | Freq: Three times a day (TID) | ORAL | Status: DC
  Administered 2011-08-31 – 2011-09-01 (×2): 5 mg via ORAL
  Filled 2011-08-31 (×7): qty 2

## 2011-08-31 MED ORDER — ACETAMINOPHEN 325 MG PO TABS: 650.0000 mg | ORAL_TABLET | ORAL | Status: DC | PRN

## 2011-08-31 MED ORDER — ROCURONIUM BROMIDE 100 MG/10ML IV SOLN
INTRAVENOUS | Status: DC | PRN
  Administered 2011-08-31: 10 mg via INTRAVENOUS
  Administered 2011-08-31: 60 mg via INTRAVENOUS

## 2011-08-31 MED ORDER — SIMVASTATIN 5 MG PO TABS
5.0000 mg | ORAL_TABLET | Freq: Every day | ORAL | Status: DC
  Administered 2011-08-31: 5 mg via ORAL
  Filled 2011-08-31 (×2): qty 1

## 2011-08-31 MED ORDER — LORATADINE 10 MG PO TABS
10.0000 mg | ORAL_TABLET | Freq: Every day | ORAL | Status: DC
  Administered 2011-09-01: 10 mg via ORAL
  Filled 2011-08-31 (×2): qty 1

## 2011-08-31 MED ORDER — PHENYLEPHRINE HCL 10 MG/ML IJ SOLN
10.0000 mg | INTRAVENOUS | Status: DC | PRN
  Administered 2011-08-31: 20 ug/min via INTRAVENOUS

## 2011-08-31 MED ORDER — THROMBIN 5000 UNITS EX SOLR
CUTANEOUS | Status: DC | PRN
  Administered 2011-08-31 (×2): 5000 [IU] via TOPICAL

## 2011-08-31 MED ORDER — ACETAMINOPHEN 650 MG RE SUPP: 650.0000 mg | RECTAL | Status: DC | PRN

## 2011-08-31 MED ORDER — HYDROMORPHONE HCL PF 1 MG/ML IJ SOLN
INTRAMUSCULAR | Status: AC
  Administered 2011-08-31: 0.5 mg via INTRAVENOUS
  Filled 2011-08-31: qty 1

## 2011-08-31 MED ORDER — PAROXETINE HCL 20 MG PO TABS
40.0000 mg | ORAL_TABLET | Freq: Every day | ORAL | Status: DC
  Administered 2011-08-31: 40 mg via ORAL
  Filled 2011-08-31 (×2): qty 2

## 2011-08-31 MED ORDER — POLYVINYL ALCOHOL-POVIDONE 1.4-0.6 % OP SOLN: 1.0000 [drp] | Freq: Three times a day (TID) | OPHTHALMIC | Status: DC

## 2011-08-31 MED ORDER — LACTATED RINGERS IV SOLN
INTRAVENOUS | Status: DC | PRN
  Administered 2011-08-31 (×2): via INTRAVENOUS

## 2011-08-31 MED ORDER — OLMESARTAN MEDOXOMIL 20 MG PO TABS
20.0000 mg | ORAL_TABLET | Freq: Every day | ORAL | Status: DC
  Filled 2011-08-31 (×2): qty 1

## 2011-08-31 MED ORDER — DEXTROSE 5 % IV SOLN
INTRAVENOUS | Status: DC | PRN
  Administered 2011-08-31: 08:00:00 via INTRAVENOUS

## 2011-08-31 MED ORDER — HYDROCODONE-ACETAMINOPHEN 5-325 MG PO TABS: 1.0000 | ORAL_TABLET | ORAL | Status: DC | PRN

## 2011-08-31 MED ORDER — KETOROLAC TROMETHAMINE 30 MG/ML IJ SOLN
INTRAMUSCULAR | Status: AC
  Filled 2011-08-31: qty 1

## 2011-08-31 MED ORDER — LIDOCAINE HCL (CARDIAC) 20 MG/ML IV SOLN
INTRAVENOUS | Status: DC | PRN
  Administered 2011-08-31: 60 mg via INTRAVENOUS

## 2011-08-31 MED ORDER — ONDANSETRON HCL 4 MG/2ML IJ SOLN
INTRAMUSCULAR | Status: DC | PRN
  Administered 2011-08-31: 4 mg via INTRAVENOUS

## 2011-08-31 MED ORDER — DOCUSATE SODIUM 100 MG PO CAPS
100.0000 mg | ORAL_CAPSULE | Freq: Two times a day (BID) | ORAL | Status: DC
  Administered 2011-09-01: 100 mg via ORAL
  Filled 2011-08-31: qty 1

## 2011-08-31 MED ORDER — SODIUM CHLORIDE 0.9 % IV SOLN
INTRAVENOUS | Status: AC
Start: 2011-08-31 — End: 2011-08-31
  Filled 2011-08-31: qty 500

## 2011-08-31 MED ORDER — HYDROMORPHONE HCL PF 1 MG/ML IJ SOLN
0.2500 mg | INTRAMUSCULAR | Status: DC | PRN
  Administered 2011-08-31 (×4): 0.5 mg via INTRAVENOUS

## 2011-08-31 MED ORDER — ONDANSETRON HCL 4 MG/2ML IJ SOLN: 4.0000 mg | Freq: Once | INTRAMUSCULAR | Status: DC | PRN

## 2011-08-31 MED ORDER — FENTANYL CITRATE 0.05 MG/ML IJ SOLN
INTRAMUSCULAR | Status: DC | PRN
  Administered 2011-08-31: 100 ug via INTRAVENOUS
  Administered 2011-08-31: 50 ug via INTRAVENOUS
  Administered 2011-08-31: 25 ug via INTRAVENOUS

## 2011-08-31 MED ORDER — METHOCARBAMOL 100 MG/ML IJ SOLN: 500.0000 mg | Freq: Four times a day (QID) | INTRAVENOUS | Status: DC | PRN

## 2011-08-31 MED ORDER — EPHEDRINE SULFATE 50 MG/ML IJ SOLN
INTRAMUSCULAR | Status: DC | PRN
  Administered 2011-08-31: 5 mg via INTRAVENOUS
  Administered 2011-08-31: 10 mg via INTRAVENOUS

## 2011-08-31 MED ORDER — VITAMIN E 180 MG (400 UNIT) PO CAPS
400.0000 [IU] | ORAL_CAPSULE | Freq: Two times a day (BID) | ORAL | Status: DC
  Administered 2011-08-31 – 2011-09-01 (×2): 400 [IU] via ORAL
  Filled 2011-08-31 (×3): qty 1

## 2011-08-31 MED ORDER — SODIUM CHLORIDE 0.9 % IV SOLN: 250.0000 mL | INTRAVENOUS | Status: DC

## 2011-08-31 MED ORDER — POLYVINYL ALCOHOL 1.4 % OP SOLN
1.0000 [drp] | Freq: Three times a day (TID) | OPHTHALMIC | Status: DC
  Filled 2011-08-31: qty 15

## 2011-08-31 MED ORDER — BACITRACIN 50000 UNITS IM SOLR
INTRAMUSCULAR | Status: AC
  Filled 2011-08-31: qty 1

## 2011-08-31 MED ORDER — METHOCARBAMOL 500 MG PO TABS: 500.0000 mg | ORAL_TABLET | Freq: Four times a day (QID) | ORAL | Status: DC | PRN

## 2011-08-31 MED ORDER — LEVOTHYROXINE SODIUM 25 MCG PO TABS
25.0000 ug | ORAL_TABLET | Freq: Every day | ORAL | Status: DC
  Administered 2011-09-01: 25 ug via ORAL
  Filled 2011-08-31 (×2): qty 1

## 2011-08-31 MED ORDER — FLUTICASONE PROPIONATE HFA 44 MCG/ACT IN AERO
2.0000 | INHALATION_SPRAY | Freq: Two times a day (BID) | RESPIRATORY_TRACT | Status: DC
  Filled 2011-08-31: qty 10.6

## 2011-08-31 MED ORDER — BISACODYL 10 MG RE SUPP: 10.0000 mg | Freq: Every day | RECTAL | Status: DC | PRN

## 2011-08-31 MED ORDER — CEFAZOLIN SODIUM 1-5 GM-% IV SOLN
1.0000 g | Freq: Three times a day (TID) | INTRAVENOUS | Status: AC
  Administered 2011-08-31 (×2): 1 g via INTRAVENOUS
  Filled 2011-08-31 (×2): qty 50

## 2011-08-31 MED ORDER — MELOXICAM 7.5 MG PO TABS
7.5000 mg | ORAL_TABLET | Freq: Two times a day (BID) | ORAL | Status: DC
  Administered 2011-08-31 – 2011-09-01 (×2): 7.5 mg via ORAL
  Filled 2011-08-31 (×3): qty 1

## 2011-08-31 MED ORDER — OXYCODONE-ACETAMINOPHEN 5-325 MG PO TABS: 1.0000 | ORAL_TABLET | ORAL | Status: DC | PRN

## 2011-08-31 MED ORDER — INSULIN ASPART 100 UNIT/ML ~~LOC~~ SOLN
0.0000 [IU] | SUBCUTANEOUS | Status: DC
  Filled 2011-08-31: qty 3

## 2011-08-31 MED ORDER — METFORMIN HCL 500 MG PO TABS
500.0000 mg | ORAL_TABLET | Freq: Two times a day (BID) | ORAL | Status: DC
  Administered 2011-08-31 – 2011-09-01 (×2): 500 mg via ORAL
  Filled 2011-08-31 (×4): qty 1

## 2011-08-31 MED ORDER — 0.9 % SODIUM CHLORIDE (POUR BTL) OPTIME
TOPICAL | Status: DC | PRN
  Administered 2011-08-31: 1000 mL

## 2011-08-31 MED ORDER — INSULIN REGULAR HUMAN 100 UNIT/ML IJ SOLN: 10.0000 [IU] | INTRAMUSCULAR | Status: DC | PRN

## 2011-08-31 MED ORDER — NEOSTIGMINE METHYLSULFATE 1 MG/ML IJ SOLN
INTRAMUSCULAR | Status: DC | PRN
  Administered 2011-08-31 (×2): 2 mg via INTRAVENOUS

## 2011-08-31 MED ORDER — MAGNESIUM HYDROXIDE 400 MG/5ML PO SUSP: 30.0000 mL | Freq: Every day | ORAL | Status: DC | PRN

## 2011-08-31 MED ORDER — FLUTICASONE PROPIONATE 50 MCG/ACT NA SUSP
1.0000 | Freq: Every day | NASAL | Status: DC
  Administered 2011-09-01: 1 via NASAL
  Filled 2011-08-31: qty 16

## 2011-08-31 MED ORDER — SODIUM CHLORIDE 0.9 % IR SOLN
Status: DC | PRN
  Administered 2011-08-31: 09:00:00

## 2011-08-31 MED ORDER — INSULIN ASPART 100 UNIT/ML ~~LOC~~ SOLN: 0.0000 [IU] | Freq: Every day | SUBCUTANEOUS | Status: DC

## 2011-08-31 MED ORDER — VERAPAMIL HCL ER 120 MG PO TBCR
120.0000 mg | EXTENDED_RELEASE_TABLET | Freq: Every day | ORAL | Status: DC
  Administered 2011-09-01: 120 mg via ORAL
  Filled 2011-08-31 (×2): qty 1

## 2011-08-31 MED ORDER — SODIUM CHLORIDE 0.9 % IJ SOLN
3.0000 mL | Freq: Two times a day (BID) | INTRAMUSCULAR | Status: DC
  Administered 2011-08-31: 3 mL via INTRAVENOUS

## 2011-08-31 MED ORDER — LIDOCAINE-EPINEPHRINE 1 %-1:100000 IJ SOLN
INTRAMUSCULAR | Status: DC | PRN
  Administered 2011-08-31: 20 mL

## 2011-08-31 MED ORDER — MODAFINIL 200 MG PO TABS
300.0000 mg | ORAL_TABLET | Freq: Every day | ORAL | Status: DC
  Administered 2011-08-31 – 2011-09-01 (×2): 300 mg via ORAL
  Filled 2011-08-31 (×2): qty 2
  Filled 2011-08-31 (×2): qty 1

## 2011-08-31 MED ORDER — TEMAZEPAM 30 MG PO CAPS
30.0000 mg | ORAL_CAPSULE | Freq: Every day | ORAL | Status: DC
  Administered 2011-08-31: 30 mg via ORAL
  Filled 2011-08-31: qty 2

## 2011-08-31 MED ORDER — ONDANSETRON HCL 4 MG/2ML IJ SOLN: 4.0000 mg | INTRAMUSCULAR | Status: DC | PRN

## 2011-08-31 MED ORDER — MORPHINE SULFATE 4 MG/ML IJ SOLN: 1.0000 mg | INTRAMUSCULAR | Status: DC | PRN

## 2011-08-31 MED ORDER — TRIAMTERENE-HCTZ 37.5-25 MG PO TABS
1.0000 | ORAL_TABLET | Freq: Every day | ORAL | Status: DC
  Administered 2011-08-31 – 2011-09-01 (×2): 1 via ORAL
  Filled 2011-08-31 (×4): qty 1

## 2011-08-31 MED ORDER — HEMOSTATIC AGENTS (NO CHARGE) OPTIME
TOPICAL | Status: DC | PRN
  Administered 2011-08-31: 1 via TOPICAL

## 2011-08-31 MED ORDER — TRIAMTERENE-HCTZ 75-50 MG PO TABS
0.5000 | ORAL_TABLET | Freq: Every day | ORAL | Status: DC
  Filled 2011-08-31: qty 0.5

## 2011-08-31 MED ORDER — INSULIN ASPART 100 UNIT/ML ~~LOC~~ SOLN
0.0000 [IU] | Freq: Three times a day (TID) | SUBCUTANEOUS | Status: DC
Start: 2011-08-31 — End: 2011-09-01
  Filled 2011-08-31: qty 3

## 2011-08-31 MED ORDER — GLYCOPYRROLATE 0.2 MG/ML IJ SOLN
INTRAMUSCULAR | Status: DC | PRN
  Administered 2011-08-31: 0.2 mg via INTRAVENOUS
  Administered 2011-08-31 (×2): .3 mg via INTRAVENOUS
  Administered 2011-08-31: 0.2 mg via INTRAVENOUS

## 2011-08-31 SURGICAL SUPPLY — 61 items
APL SKNCLS STERI-STRIP NONHPOA (GAUZE/BANDAGES/DRESSINGS) ×1
BAG DECANTER FOR FLEXI CONT (MISCELLANEOUS) ×2 IMPLANT
BENZOIN TINCTURE PRP APPL 2/3 (GAUZE/BANDAGES/DRESSINGS) ×1 IMPLANT
BLADE SURG ROTATE 9660 (MISCELLANEOUS) IMPLANT
BUR ACORN 6.0 ACORN (BURR) ×2 IMPLANT
BUR ACRON 5.0MM COATED (BURR) IMPLANT
CANISTER SUCTION 2500CC (MISCELLANEOUS) ×2 IMPLANT
CLOTH BEACON ORANGE TIMEOUT ST (SAFETY) ×2 IMPLANT
CONT SPEC 4OZ CLIKSEAL STRL BL (MISCELLANEOUS) ×1 IMPLANT
DRAPE LAPAROTOMY 100X72X124 (DRAPES) ×2 IMPLANT
DRAPE MICROSCOPE LEICA (MISCELLANEOUS) ×2 IMPLANT
DRAPE POUCH INSTRU U-SHP 10X18 (DRAPES) ×2 IMPLANT
DRAPE SURG 17X23 STRL (DRAPES) ×2 IMPLANT
DRESSING TELFA 8X3 (GAUZE/BANDAGES/DRESSINGS) IMPLANT
DURAPREP 26ML APPLICATOR (WOUND CARE) ×2 IMPLANT
ELECT REM PT RETURN 9FT ADLT (ELECTROSURGICAL) ×2
ELECTRODE REM PT RTRN 9FT ADLT (ELECTROSURGICAL) ×1 IMPLANT
GAUZE SPONGE 4X4 16PLY XRAY LF (GAUZE/BANDAGES/DRESSINGS) IMPLANT
GLOVE BIOGEL M 8.0 STRL (GLOVE) ×1 IMPLANT
GLOVE BIOGEL PI IND STRL 7.0 (GLOVE) IMPLANT
GLOVE BIOGEL PI IND STRL 7.5 (GLOVE) IMPLANT
GLOVE BIOGEL PI IND STRL 8.5 (GLOVE) IMPLANT
GLOVE BIOGEL PI INDICATOR 7.0 (GLOVE) ×1
GLOVE BIOGEL PI INDICATOR 7.5 (GLOVE) ×1
GLOVE BIOGEL PI INDICATOR 8.5 (GLOVE) ×2
GLOVE ECLIPSE 7.5 STRL STRAW (GLOVE) ×2 IMPLANT
GLOVE EXAM NITRILE LRG STRL (GLOVE) IMPLANT
GLOVE EXAM NITRILE MD LF STRL (GLOVE) IMPLANT
GLOVE EXAM NITRILE XL STR (GLOVE) IMPLANT
GLOVE EXAM NITRILE XS STR PU (GLOVE) IMPLANT
GLOVE SURG SS PI 7.0 STRL IVOR (GLOVE) ×1 IMPLANT
GLOVE SURG SS PI 7.5 STRL IVOR (GLOVE) ×1 IMPLANT
GLOVE SURG SS PI 8.0 STRL IVOR (GLOVE) ×3 IMPLANT
GOWN BRE IMP SLV AUR LG STRL (GOWN DISPOSABLE) ×3 IMPLANT
GOWN BRE IMP SLV AUR XL STRL (GOWN DISPOSABLE) ×2 IMPLANT
GOWN STRL REIN 2XL LVL4 (GOWN DISPOSABLE) ×2 IMPLANT
KIT BASIN OR (CUSTOM PROCEDURE TRAY) ×2 IMPLANT
KIT ROOM TURNOVER OR (KITS) ×2 IMPLANT
NDL HYPO 18GX1.5 BLUNT FILL (NEEDLE) IMPLANT
NEEDLE HYPO 18GX1.5 BLUNT FILL (NEEDLE) IMPLANT
NEEDLE HYPO 22GX1.5 SAFETY (NEEDLE) ×4 IMPLANT
NS IRRIG 1000ML POUR BTL (IV SOLUTION) ×2 IMPLANT
PACK LAMINECTOMY NEURO (CUSTOM PROCEDURE TRAY) ×2 IMPLANT
PAD ARMBOARD 7.5X6 YLW CONV (MISCELLANEOUS) ×8 IMPLANT
PATTIES SURGICAL .75X.75 (GAUZE/BANDAGES/DRESSINGS) ×2 IMPLANT
RUBBERBAND STERILE (MISCELLANEOUS) ×4 IMPLANT
SPONGE GAUZE 4X4 12PLY (GAUZE/BANDAGES/DRESSINGS) ×1 IMPLANT
SPONGE LAP 4X18 X RAY DECT (DISPOSABLE) IMPLANT
SPONGE SURGIFOAM ABS GEL SZ50 (HEMOSTASIS) ×2 IMPLANT
STRIP CLOSURE SKIN 1/2X4 (GAUZE/BANDAGES/DRESSINGS) ×1 IMPLANT
SUT PROLENE 6 0 BV (SUTURE) IMPLANT
SUT VIC AB 0 CT1 18XCR BRD8 (SUTURE) ×1 IMPLANT
SUT VIC AB 0 CT1 8-18 (SUTURE) ×4
SUT VIC AB 2-0 CP2 18 (SUTURE) ×3 IMPLANT
SUT VIC AB 3-0 SH 8-18 (SUTURE) ×2 IMPLANT
SYR 20CC LL (SYRINGE) ×2 IMPLANT
SYR 5ML LL (SYRINGE) IMPLANT
TAPE CLOTH SURG 4X10 WHT LF (GAUZE/BANDAGES/DRESSINGS) ×1 IMPLANT
TOWEL OR 17X24 6PK STRL BLUE (TOWEL DISPOSABLE) ×2 IMPLANT
TOWEL OR 17X26 10 PK STRL BLUE (TOWEL DISPOSABLE) ×2 IMPLANT
WATER STERILE IRR 1000ML POUR (IV SOLUTION) ×2 IMPLANT

## 2011-08-31 NOTE — Transfer of Care (Signed)
Immediate Anesthesia Transfer of Care Note  Patient: [name]  Procedure(s) Performed:  LUMBAR LAMINECTOMY/DECOMPRESSION MICRODISCECTOMY - LumbarThree to Lumbar Five Laminectomy, possible Lumbar Four-Five Diskectomy  Patient Location: PACU  Anesthesia Type: General  Level of Consciousness: awake  Airway & Oxygen Therapy: Patient Spontanous Breathing and Patient connected to nasal cannula oxygen  Post-op Assessment: Report given to PACU RN and Post -op Vital signs reviewed and stable  Post vital signs: Reviewed and stable Filed Vitals:   08/31/11 1015  BP:   Pulse:   Temp: 36.1 C  Resp:     Complications: No apparent anesthesia complications

## 2011-08-31 NOTE — H&P (Signed)
See h&P 

## 2011-08-31 NOTE — Plan of Care (Signed)
Problem: Consults Goal: Diagnosis - Spinal Surgery Outcome: Completed/Met Date Met:  08/31/11 Lumbar Laminectomy (Complex)

## 2011-08-31 NOTE — Progress Notes (Signed)
Need clarification on ted hose order. Left message for dr. Blanche East to return my call.

## 2011-08-31 NOTE — Anesthesia Postprocedure Evaluation (Signed)
  Anesthesia Post-op Note  Patient: [name]  Procedure(s) Performed:  LUMBAR LAMINECTOMY/DECOMPRESSION MICRODISCECTOMY - LumbarThree to Lumbar Five Laminectomy, possible Lumbar Four-Five Diskectomy  Patient Location: PACU  Anesthesia Type: General  Level of Consciousness: awake, alert , oriented and patient cooperative  Airway and Oxygen Therapy: Patient Spontanous Breathing and Patient connected to nasal cannula oxygen  Post-op Pain: mild  Post-op Assessment: Post-op Vital signs reviewed, Patient's Cardiovascular Status Stable, Respiratory Function Stable, Patent Airway, No signs of Nausea or vomiting and Pain level controlled  Post-op Vital Signs: stable  Complications: No apparent anesthesia complications

## 2011-08-31 NOTE — Preoperative (Signed)
Beta Blockers   Reason not to administer Beta Blockers:Not Applicable 

## 2011-08-31 NOTE — Op Note (Signed)
08/31/2011  10:15 AM  PATIENT:  [name]  61 y.o. male  PRE-OPERATIVE DIAGNOSIS:  Lumbar stenosis, Lumbago, Lumbar spondylosis  POST-OPERATIVE DIAGNOSIS:  Lumbar Stenosis,Lumbago, Lumbar Spondylosis  PROCEDURE:  Procedure(s): LUMBAR LAMINECTOMY, decompressing L3, L4 , L5 roots  (3levels)/ MICRODISCECTOMY , microdisection  SURGEON:  Surgeon(s): Emelia Salisbury M Botero  ASSISTANTS:botero  ANESTHESIA:   general  EBL:  Total I/O In: 1050 [I.V.:1050] Out: 150 [Blood:150]  BLOOD ADMINISTERED:none  DRAINS: none   SPECIMEN:  No Specimen  DICTATION: Patient with back and leg pain trouble walking at worst left and brought in for decompression laminectomy. There  Patient brought into the operating room and general anesthesia induced. Patient placed in a prone position Wilson frame with all pressure points padded. Patient was prepped and draped in a sterile fashion and 7 incision injected with 20 cc 1% lidocaine. With epinephrine. Needle was placed in interspace x-rays obtained showing the needle was putting at L3-4 interspace. An incision was made from L3-L5 incision taken the fascia hemostasis obtained with Bovie cauterization. The fascia was incised and subperiosteal dissection was done over the L3-4 and 5 of process and lamina. Markers were placed and the 3445 interspace another x-ray was obtained confirming or positioning. Been soaking retractors in and then a decompressive laminectomy was done with a curettes and high-speed drills and rongeurs. We removed the by mouth of L3 all of L4 and the top of L5. We decompressed central canal and bilateral gutters. We decompressed the L3-L4 and L5 roots bilaterally. Once we had good decompression and brought in the microscope for microdissection technique and we evaluated the or 5 disc space. On the left sides a large extruded disc at the an interval was actually a fairly medial to incise the disc space on the left side and performed a  discectomy with pituitary rongeurs and curettes. We were finished we did decompression the central canal and bilateral nerve roots of 34 and 5 and no further disc herniation causing any compression the nerve roots of L4-5 area. The hemostasis with Gelfoam thrombin and bipolar cauterization. Semi-solution retractors removed fascia closed with 0 Vicryl interrupted sutures subcutaneous tissue closed with 02 3-0 Vicryl interrupted sutures skin closed with benzoin Steri-Strips. Dressing was placed patient was placed back to a supine position (and transferred recovery room  PLAN OF CARE: Admit to inpatient   PATIENT DISPOSITION:  PACU - hemodynamically stable.

## 2011-08-31 NOTE — Interval H&P Note (Signed)
History and Physical Interval Note:  08/31/2011 7:37 AM  [name]  has presented today for surgery, with the diagnosis of Lumbar stenosis, Lumbago, Lumbar spondylosis  The various methods of treatment have been discussed with the patient and family. After consideration of risks, benefits and other options for treatment, the patient has consented to  Procedure(s): LUMBAR LAMINECTOMY/DECOMPRESSION MICRODISCECTOMY as a surgical intervention .  The patients' history has been reviewed, patient examined, no change in status, stable for surgery.  I have reviewed the patients' chart and labs.  Questions were answered to the patient's satisfaction.     Jennifr Gaeta R

## 2011-08-31 NOTE — Anesthesia Preprocedure Evaluation (Addendum)
Anesthesia Evaluation    Reviewed: Allergy & Precautions, H&P , NPO status , Patient's Chart, lab work & pertinent test results  Airway Mallampati: I TM Distance: >3 FB Neck ROM: full    Dental  (+) Teeth Intact   Pulmonary sleep apnea ,          Cardiovascular hypertension, Pt. on medications regular Normal    Neuro/Psych PSYCHIATRIC DISORDERS Anxiety Depression Negative Neurological ROS     GI/Hepatic Neg liver ROS, GERD-  Medicated and Controlled,  Endo/Other  Diabetes mellitus-, Well Controlled, Type 2, Insulin Dependent and Oral Hypoglycemic AgentsHypothyroidism   Renal/GU negative Renal ROS  Genitourinary negative   Musculoskeletal   Abdominal   Peds  Hematology negative hematology ROS (+)   Anesthesia Other Findings   Reproductive/Obstetrics                          Anesthesia Physical Anesthesia Plan  ASA: III  Anesthesia Plan: General and General ETT   Post-op Pain Management:    Induction: Intravenous  Airway Management Planned: Oral ETT  Additional Equipment:   Intra-op Plan:   Post-operative Plan: Extubation in OR  Informed Consent: I have reviewed the patients History and Physical, chart, labs and discussed the procedure including the risks, benefits and alternatives for the proposed anesthesia with the patient or authorized representative who has indicated his/her understanding and acceptance.   Dental advisory given  Plan Discussed with: Anesthesiologist and Surgeon  Anesthesia Plan Comments:        Anesthesia Quick Evaluation

## 2011-08-31 NOTE — Progress Notes (Signed)
Pt has failed to void post-op.  I&O cath was attempted by NT 3.  I was called into room to help assist and we both were unsuccessful.  It feels as though catheter is curving in urethra.  There was minimal resistance and no bleeding.  Will ask night shift nurse to try and if still unsuccessful and pt is uncomfortable, will notify MD for further instruction.

## 2011-09-01 MED ORDER — CYCLOBENZAPRINE HCL 10 MG PO TABS: 10.0000 mg | ORAL_TABLET | Freq: Three times a day (TID) | ORAL | Status: AC | PRN

## 2011-09-01 MED ORDER — HYDROCODONE-ACETAMINOPHEN 5-325 MG PO TABS: 1.0000 | ORAL_TABLET | ORAL | Status: AC | PRN

## 2011-09-01 NOTE — Discharge Summary (Signed)
Physician Discharge Summary  Patient ID: [name] MRN: [id number] DOB/AGE: November 09, 1950 61 y.o.  Admit date: 08/31/2011 Discharge date: 09/01/2011  Admission Diagnoses:Lumbar stenosis, Lumbago, Lumbar spondylosis   Discharge Diagnoses: Lumbar stenosis, Lumbago, Lumbar spondylosis  Active Problems:  * No active hospital problems. *    Discharged Condition: good  Hospital Course: pt admitted on day of surgery - under went procedure below - much less leg pain - mild incisional pain - ambulating, eating, voiding - d/c home  Consults: none  Significant Diagnostic Studies: none  Treatments: surgery: PROCEDURE: Procedure(s):  LUMBAR LAMINECTOMY, decompressing L3, L4 , L5 roots (3levels)/ MICRODISCECTOMY , microdisection   Discharge Exam: Blood pressure 148/79, pulse 59, temperature 98.5 F (36.9 C), temperature source Axillary, resp. rate 16, SpO2 97.00%. Wound:c/d/i  Disposition: home   Current Discharge Medication List    START taking these medications   Details  cyclobenzaprine (FLEXERIL) 10 MG tablet Take 1 tablet (10 mg total) by mouth 3 (three) times daily as needed for muscle spasms. Qty: 50 tablet, Refills: 2    HYDROcodone-acetaminophen (NORCO) 5-325 MG per tablet Take 1-2 tablets by mouth every 4 (four) hours as needed for pain. Qty: 51 tablet, Refills: 0      CONTINUE these medications which have NOT CHANGED   Details  bromocriptine (PARLODEL) 5 MG capsule Take 5 mg by mouth 3 (three) times daily.      Cholecalciferol (VITAMIN D3) 1000 UNITS CAPS Take by mouth every morning.      fexofenadine (ALLEGRA) 180 MG tablet Take 180 mg by mouth daily.      flunisolide (NASAREL) 29 MCG/ACT (0.025%) nasal spray 2 sprays by Nasal route 2 (two) times daily. Dose is for each nostril.     meloxicam (MOBIC) 7.5 MG tablet Take 7.5 mg by mouth 2 (two) times daily.      metFORMIN (GLUCOPHAGE) 500 MG tablet Take 500 mg by mouth 2 (two) times daily.      modafinil  (PROVIGIL) 200 MG tablet Take 300 mg by mouth daily.     Omega-3 Fatty Acids (FISH OIL PO) Take 1,000 mg by mouth 3 (three) times daily.     PARoxetine (PAXIL) 40 MG tablet Take 40 mg by mouth at bedtime.      polyvinyl alcohol-povidone (HYPOTEARS) 1.4-0.6 % ophthalmic solution Place 1 drop into both eyes 3 (three) times daily.     pravastatin (PRAVACHOL) 20 MG tablet Take 20 mg by mouth at bedtime.     temazepam (RESTORIL) 15 MG capsule Take 30 mg by mouth at bedtime.    triamterene-hydrochlorothiazide (MAXZIDE) 75-50 MG per tablet Take 0.5 tablets by mouth daily.    valsartan (DIOVAN) 160 MG tablet Take 160 mg by mouth 2 (two) times daily.     verapamil (CALAN) 120 MG tablet Take 120 mg by mouth daily.      verapamil (COVERA HS) 180 MG (CO) 24 hr tablet Take 180 mg by mouth daily.      vitamin E 400 UNIT capsule Take 400 Units by mouth 2 (two) times daily.      flunisolide (AEROBID) 250 MCG/ACT inhaler Inhale 2 puffs into the lungs 2 (two) times daily.     insulin regular (HUMULIN R,NOVOLIN R) 100 UNIT/ML injection Inject 10 Units into the skin as needed. Insulin Reg Human Novalin R100 Unit/ML  10 units daily IF blood sugar over 140    Levothyroxine Sodium (SYNTHROID PO) Take 0.025 mg by mouth daily.  Signed: Clydene Fake, MD 09/01/2011, 8:29 AM

## 2011-09-01 NOTE — Progress Notes (Signed)
Pt voided at 2115 and had a PVR of 64 at that time.  He also voided again at 0500, a volume of . Pt used his own CPAP from home per his insistence, Barry Brunner had to come in to inspect and approve the machine.

## 2011-09-02 ENCOUNTER — Encounter (HOSPITAL_COMMUNITY): Payer: Self-pay | Admitting: Neurosurgery

## 2012-04-23 IMAGING — CR DG CHEST 2V
2 series · 2 of 2 positions shown · non-contrast
Comparison: 07/31/2008

CLINICAL DATA: Preop for knee surgery.

CHEST - 2 VIEW

[w chest pa]
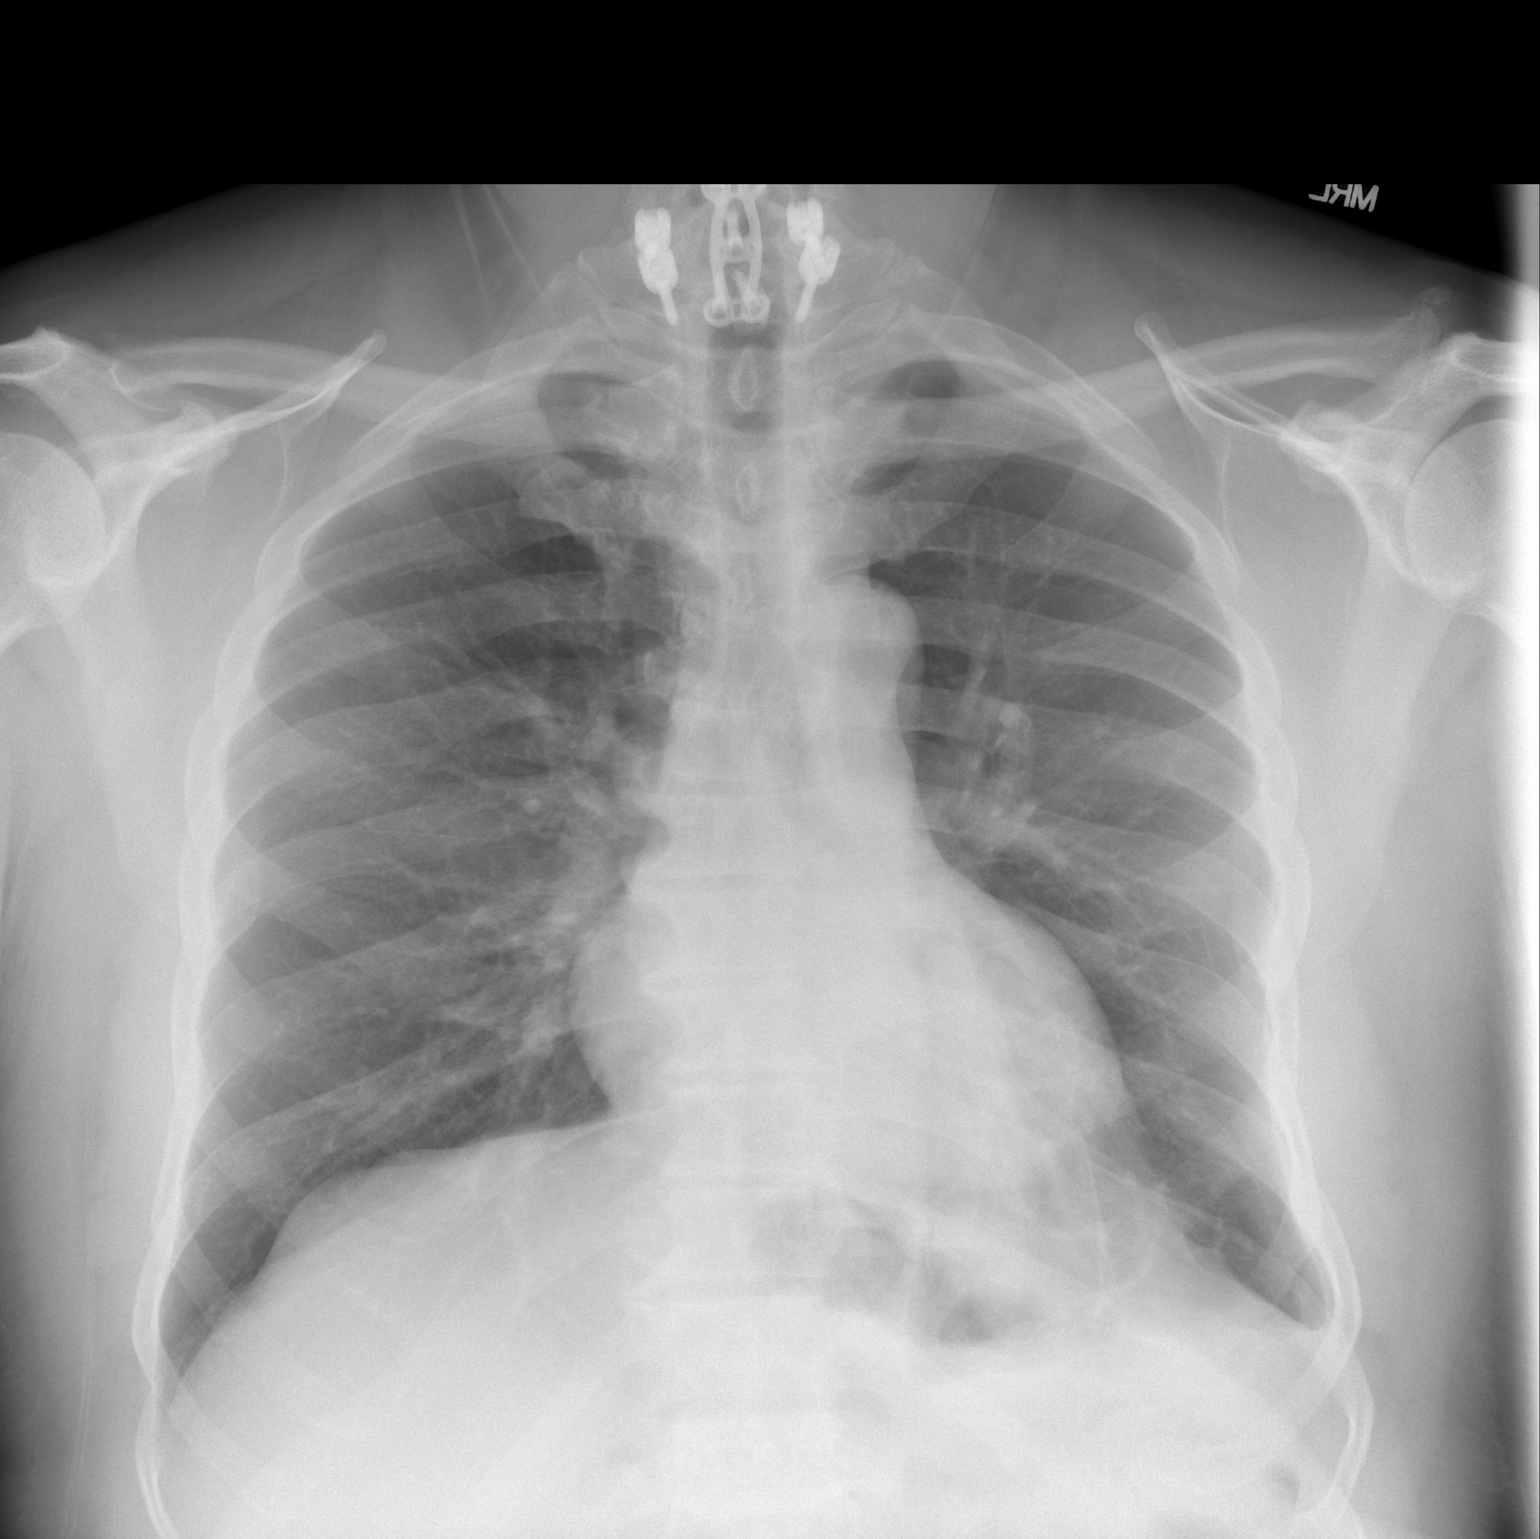

[w chest lat]
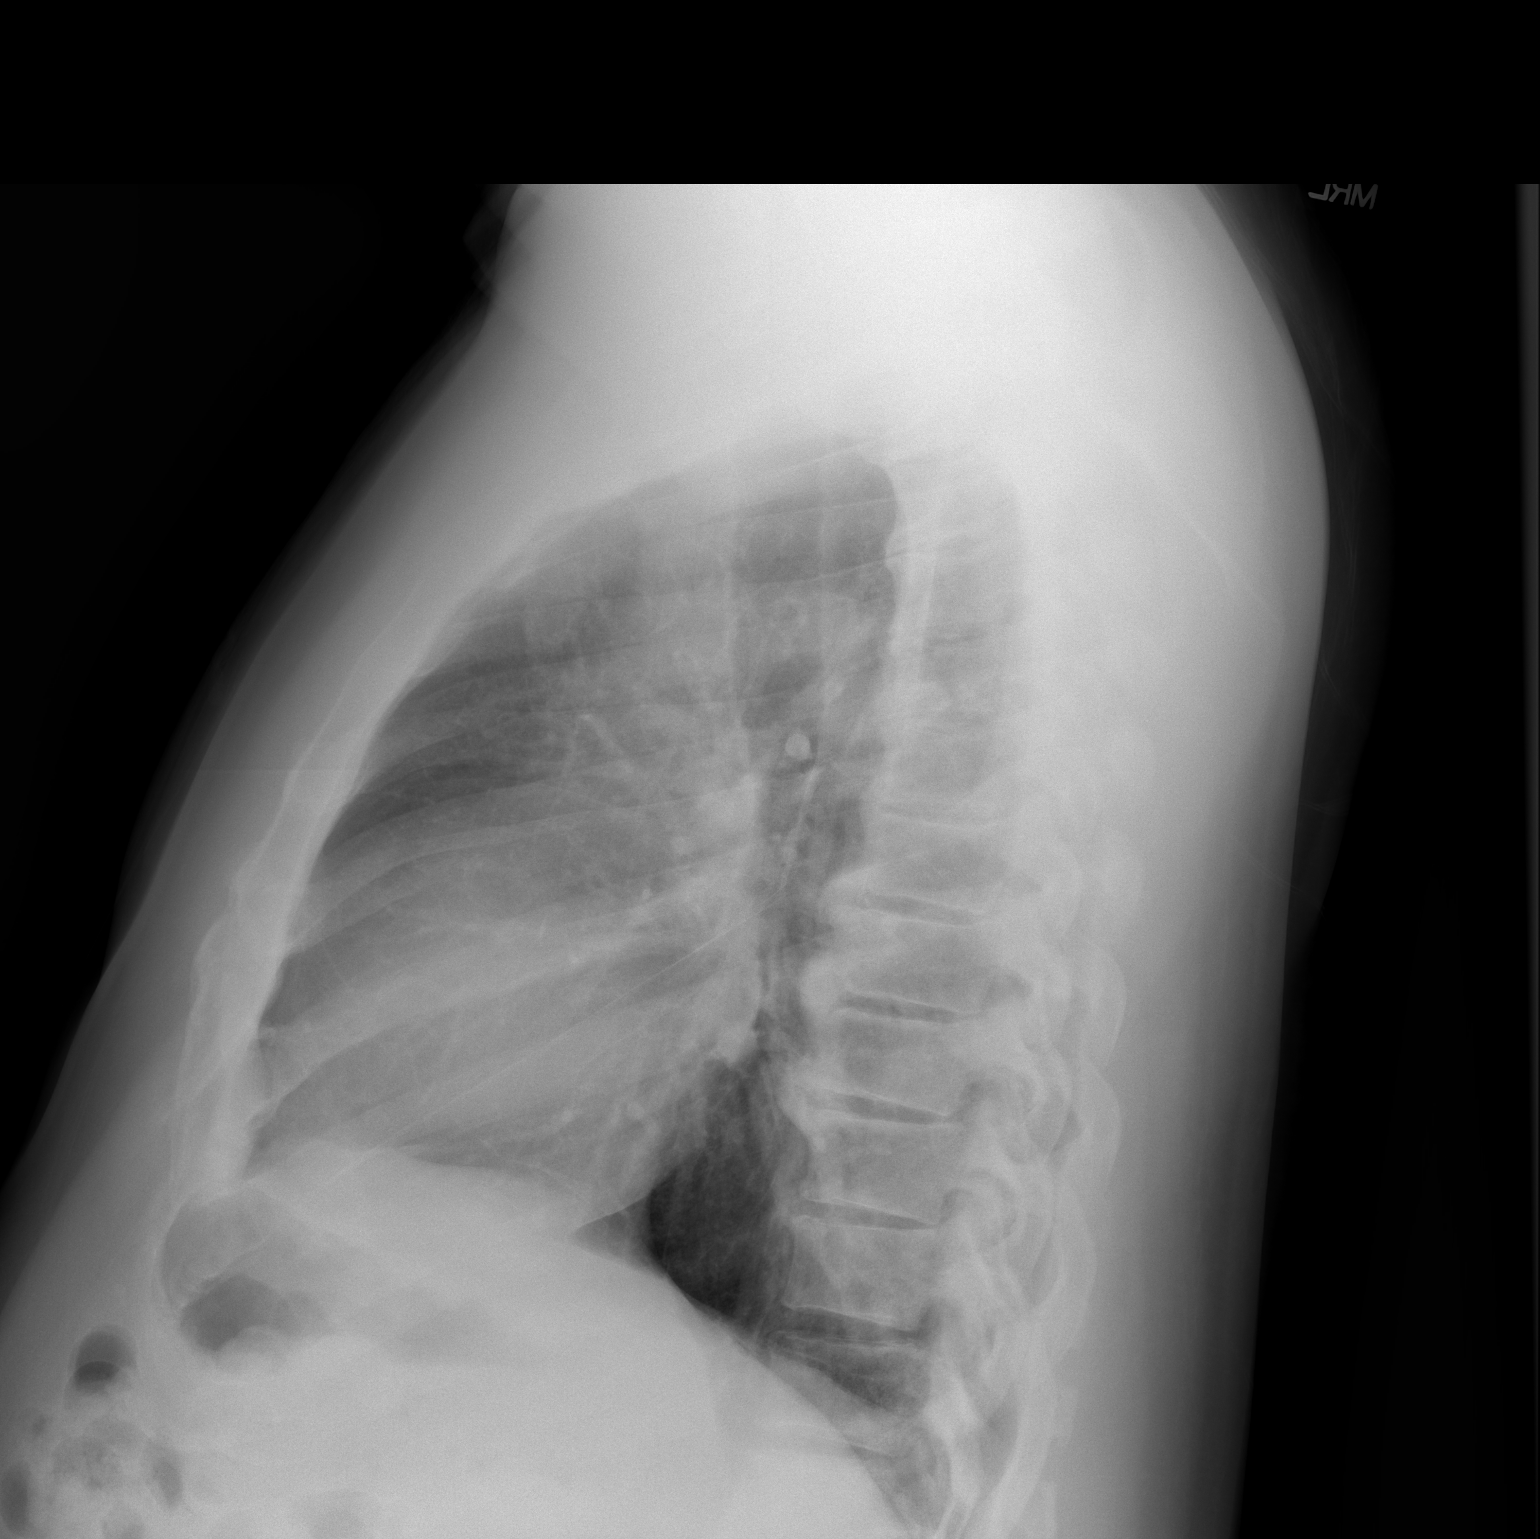

[2 of 2 positions shown; findings below may reference images not displayed]

FINDINGS: Lower cervical spine fixation.  Mid thoracic spondylosis
versus diffuse idiopathic skeletal hyperostosis.  The costophrenic
angles are excluded minimally on the lateral view.

Midline trachea.  Normal heart size and mediastinal contours.
Blunting of the left costophrenic angle,  likely represents a small
amount of pleural thickening versus trace fluid. No pneumothorax.
Clear lungs.
IMPRESSION: 1. No acute cardiopulmonary disease.
2.  Trace left-sided pleural thickening versus fluid blunts the
left costophrenic angle.  Not definitely present on prior exams.

## 2012-09-05 LAB — HEPATIC FUNCTION PANEL
ALT: 22 U/L (ref 10–40)
AST: 20 U/L (ref 14–40)
Bilirubin, Total: 0.3 mg/dL

## 2012-09-05 LAB — HEMOGLOBIN A1C: Hgb A1c MFr Bld: 6.3 % — AB (ref 4.0–6.0)

## 2012-09-05 LAB — BASIC METABOLIC PANEL: Glucose: 90 mg/dL

## 2012-10-13 ENCOUNTER — Encounter: Payer: Self-pay | Admitting: Hematology

## 2013-02-25 IMAGING — CR DG CHEST 2V
2 series · 2 of 2 positions shown · non-contrast
Comparison: 10/23/2010

CLINICAL DATA: Preop evaluation.

CHEST - 2 VIEW

[view not recorded (1 of 2)]
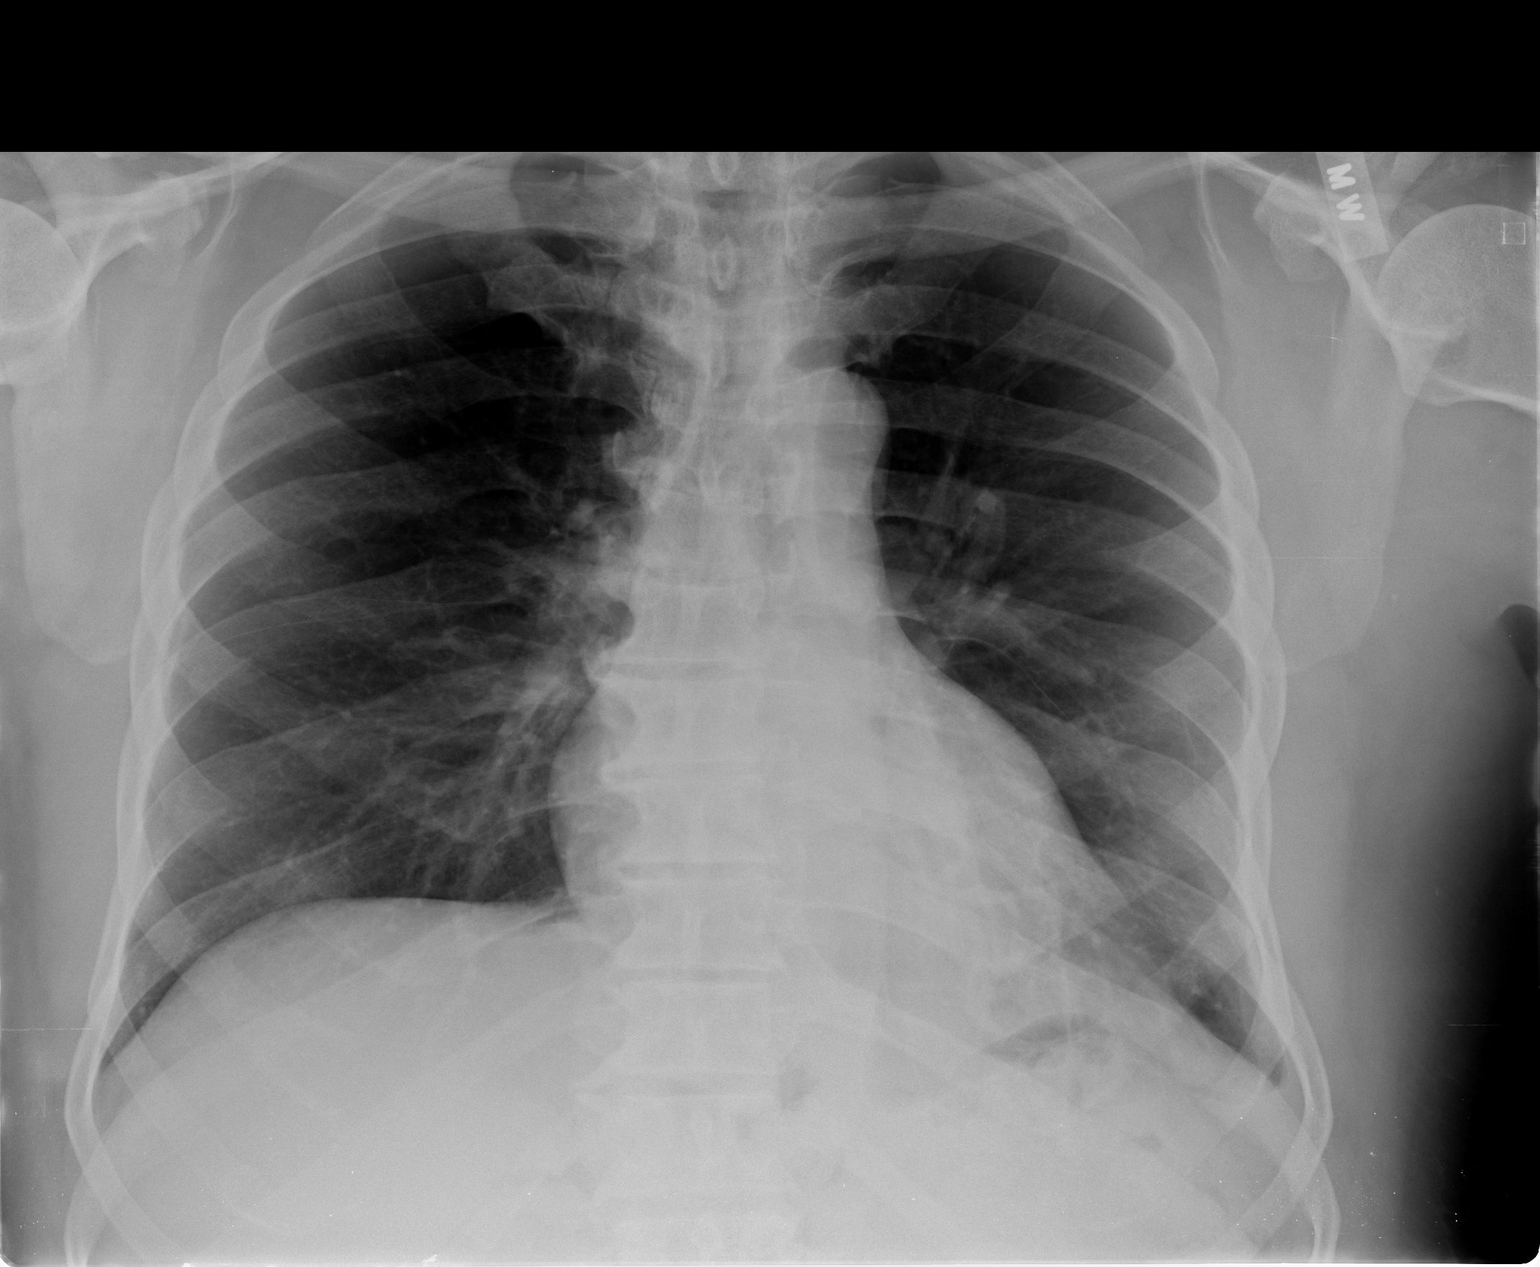

[view not recorded (2 of 2)]
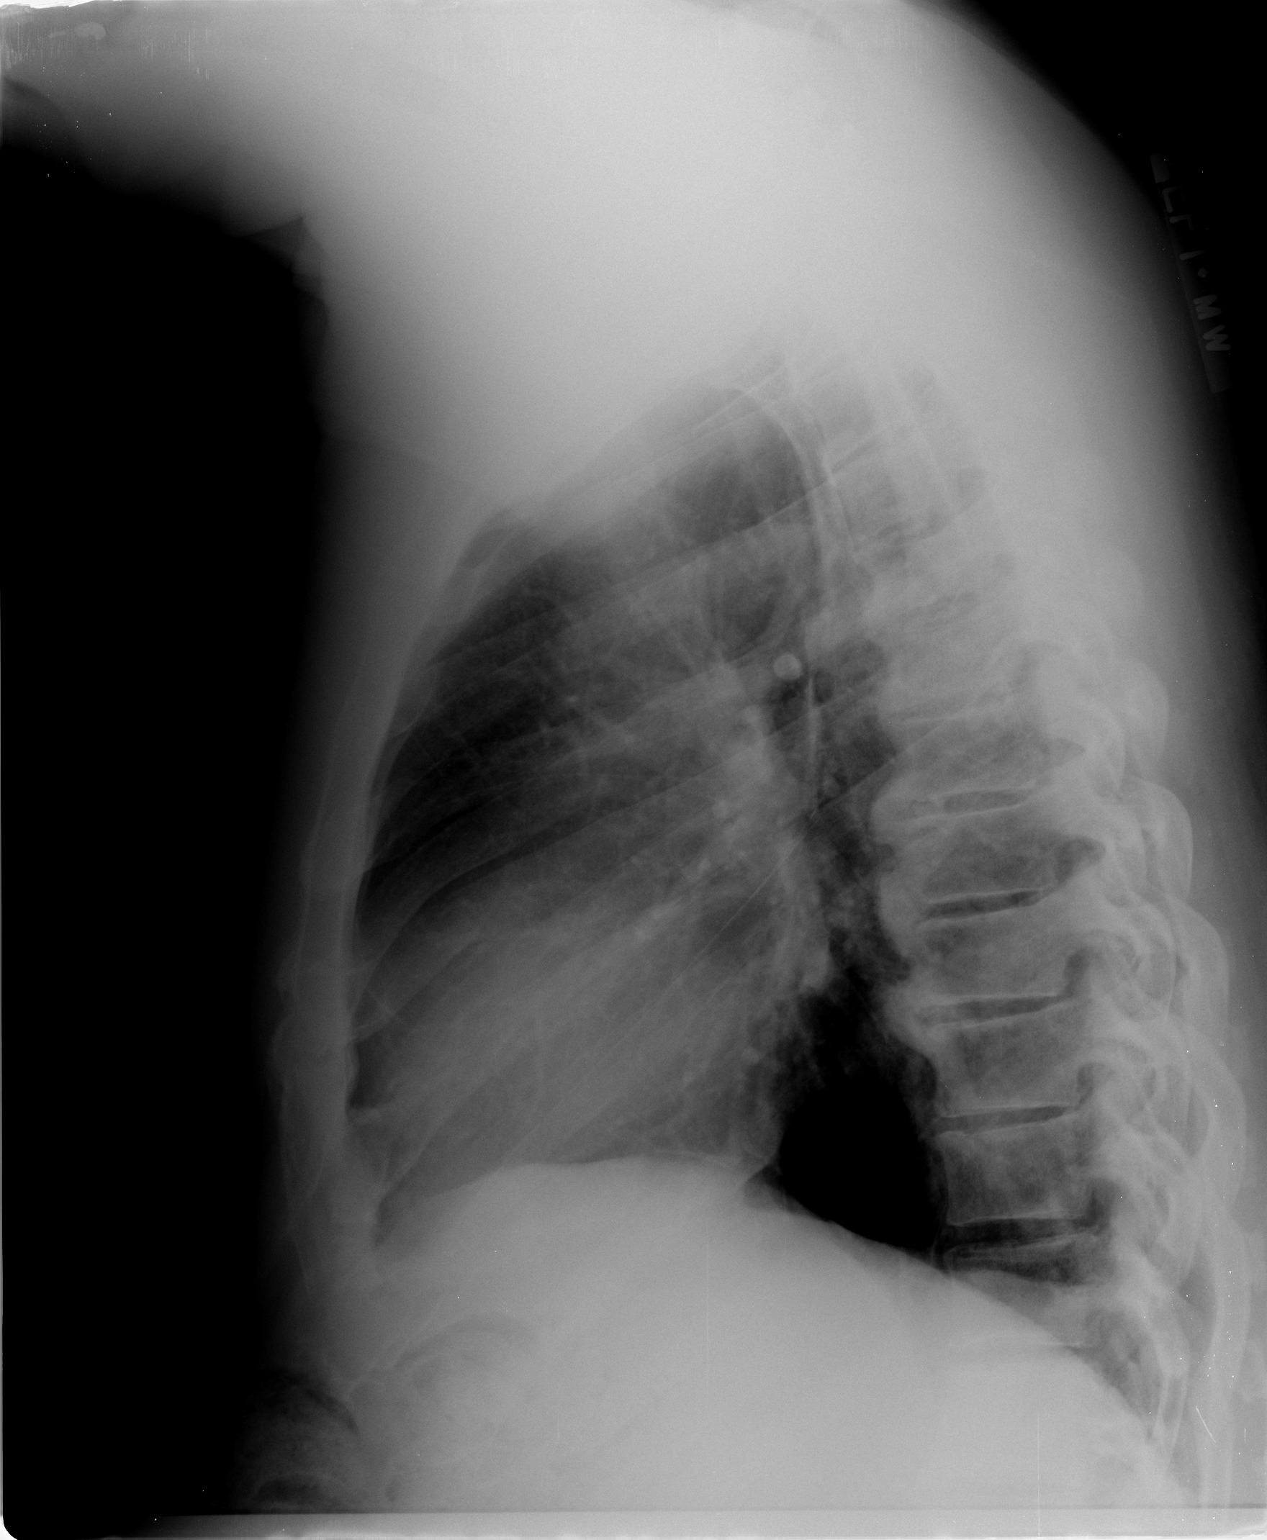

[2 of 2 positions shown; findings below may reference images not displayed]

FINDINGS: Two views of the chest were obtained.  Postsurgical
changes in the lower cervical spine.  Multilevel degenerative
changes in the thoracic spine.  Stable appearance of the heart and
mediastinum.  Stable blunting at the left costophrenic angle
probably represents scarring.
IMPRESSION: Stable chest radiograph findings.  No acute changes.

## 2015-01-10 ENCOUNTER — Ambulatory Visit
Admission: RE | Admit: 2015-01-10 | Discharge: 2015-01-10 | Disposition: A | Discharge status: Home / Self Care | Payer: Medicare Other | Source: Ambulatory Visit | Attending: Internal Medicine | Admitting: Internal Medicine

## 2015-01-10 ENCOUNTER — Other Ambulatory Visit: Payer: Self-pay | Admitting: Internal Medicine

## 2015-01-10 DIAGNOSIS — R109 Unspecified abdominal pain: Secondary | ICD-10-CM

## 2015-09-19 ENCOUNTER — Other Ambulatory Visit: Payer: Self-pay | Admitting: Internal Medicine

## 2015-09-19 ENCOUNTER — Ambulatory Visit
Admission: RE | Admit: 2015-09-19 | Discharge: 2015-09-19 | Disposition: A | Discharge status: Home / Self Care | Payer: Medicare Other | Source: Ambulatory Visit | Attending: Internal Medicine | Admitting: Internal Medicine

## 2015-09-19 DIAGNOSIS — M542 Cervicalgia: Secondary | ICD-10-CM

## 2015-12-15 DIAGNOSIS — G4733 Obstructive sleep apnea (adult) (pediatric): Secondary | ICD-10-CM

## 2015-12-15 DIAGNOSIS — G47419 Narcolepsy without cataplexy: Secondary | ICD-10-CM

## 2015-12-15 HISTORY — DX: Narcolepsy without cataplexy: G47.419

## 2015-12-15 HISTORY — DX: Obstructive sleep apnea (adult) (pediatric): G47.33

## 2017-04-07 ENCOUNTER — Encounter: Payer: Self-pay | Admitting: Pulmonary Disease

## 2017-11-08 ENCOUNTER — Encounter (INDEPENDENT_AMBULATORY_CARE_PROVIDER_SITE_OTHER): Payer: Self-pay | Admitting: Orthopaedic Surgery

## 2017-11-08 ENCOUNTER — Ambulatory Visit (INDEPENDENT_AMBULATORY_CARE_PROVIDER_SITE_OTHER): Payer: No Typology Code available for payment source | Admitting: Orthopaedic Surgery

## 2017-11-08 ENCOUNTER — Encounter (INDEPENDENT_AMBULATORY_CARE_PROVIDER_SITE_OTHER): Payer: Self-pay

## 2017-11-08 VITALS — BP 150/75 | HR 58 | Resp 18 | Ht 73.5 in | Wt 245.0 lb

## 2017-11-08 DIAGNOSIS — G8929 Other chronic pain: Secondary | ICD-10-CM

## 2017-11-08 DIAGNOSIS — M7502 Adhesive capsulitis of left shoulder: Secondary | ICD-10-CM | POA: Diagnosis not present

## 2017-11-08 DIAGNOSIS — M75102 Unspecified rotator cuff tear or rupture of left shoulder, not specified as traumatic: Secondary | ICD-10-CM

## 2017-11-08 DIAGNOSIS — M25512 Pain in left shoulder: Secondary | ICD-10-CM | POA: Diagnosis not present

## 2017-11-08 NOTE — Progress Notes (Signed)
Office Visit Note   Patient: [name]           Date of Birth: 17-Jan-1951           MRN: [id number] Visit Date: 11/08/2017              Requested by: Verline Lema, MD [place], [place] [address] PCP: Rogers Blocker, MD   Assessment & Plan: Visit Diagnoses:  1. Tear of left rotator cuff, unspecified tear extent   2. Adhesive capsulitis of left shoulder   3. Chronic left shoulder pain     Plan:  #1: Dr. Durward Fortes had a discussion with [name] in regards to surgical intervention.  This would include that of an arthroscopic subacromial decompression, distal clavicle excision, mini open rotator cuff repair, possible dermaspan patch, and manipulation.  He was told that we could not be tentative of how well this will improve his symptoms until we are in the shoulder.  He is understanding of this.  He would like to proceed with this in the near future.  Procedure risks and benefits were explained to him by Dr. Durward Fortes.  Cannot predict his is also at this time.  Follow-Up Instructions: Return if symptoms worsen or fail to improve.   Face-to-face time spent with patient was greater than 30 minutes.  Greater than 50% of the time was spent in counseling and coordination of care.  Orders:  No orders of the defined types were placed in this encounter.  No orders of the defined types were placed in this encounter.     Procedures: No procedures performed   Clinical Data: No additional findings.   Subjective: Chief Complaint  Patient presents with  . Left Shoulder - Pain    HPI  [name] is a 67 year old African-American male who is seen today at his request and referral from the [facility] hospital for evaluation of his left shoulder.  He states that in December he was walking down some stairs with his dog and apparently lost his footing and fell and injured his left shoulder.  In January 2019 he did have x-rays of his shoulder and in October 27, 2017 had an MRI  of his left shoulder.  He was seen at the [facility] and was told that he had a rotator cuff tear as well as degenerative changes and would require surgery.  He therefore came to our office and wanted [place] to perform the surgery.  Apparently he got clearance from the [place] for this.  Seen today for evaluation.    Review of Systems  Constitutional: Negative for fatigue and fever.  HENT: Negative for ear pain.   Eyes: Negative for pain.  Respiratory: Negative for cough and shortness of breath.   Cardiovascular: Negative for leg swelling.  Gastrointestinal: Negative for blood in stool, constipation and diarrhea.  Genitourinary: Negative for difficulty urinating.  Musculoskeletal: Positive for neck pain. Negative for back pain.  Skin: Negative for rash and wound.  Allergic/Immunologic: Negative for food allergies.  Neurological: Positive for weakness and numbness. Negative for dizziness, light-headedness and headaches.  Hematological: Does not bruise/bleed easily.  Psychiatric/Behavioral: Negative for sleep disturbance.     Objective: Vital Signs: BP (!) 150/75 (BP Location: Left Arm, Patient Position: Sitting, Cuff Size: Normal)   Pulse (!) 58   Resp 18   Ht 6' 1.5" (1.867 m)   Wt 245 lb (111.1 kg)   BMI 31.89 kg/m   Physical Exam  Constitutional: He is oriented  to person, place, and time. He appears well-developed and well-nourished.  HENT:  Head: Normocephalic and atraumatic.  Eyes: Pupils are equal, round, and reactive to light. EOM are normal.  Pulmonary/Chest: Effort normal.  Neurological: He is alert and oriented to person, place, and time.  Skin: Skin is warm and dry.  Psychiatric: He has a normal mood and affect. His behavior is normal. Judgment and thought content normal.    Ortho Exam  Today his shoulder reveals passively abduction by 30 degrees.  Forward flexion approximately 40 degrees passively.  With the arm at his side external rotation only about 20 degrees internally 45  degrees.  He has breakaway weakness throughout the shoulder.  Neurovascular intact otherwise.  Specialty Comments:  No specialty comments available.  Imaging: No results found.   PMFS History: Patient Active Problem List   Diagnosis Date Noted  . Essential hypertension 11/25/2010  . Diabetes mellitus type 2 in nonobese ([place]) 11/25/2010  . 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 Degenerative joint disease involving multiple joints 11/25/2010  . Allergic rhinitis due to pollen 11/25/2010   Past Medical History:  Diagnosis Date  . Allergic rhinitis   . Anxiety   . Depression   . Diabetes mellitus   . DJD (degenerative joint disease)   . Hyperlipidemia   . Hypertension   . Hypothyroidism   . Mental disorder   . Narcolepsy    takes provigil  . Osteoporosis   . 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 Sleep apnea    cpac  . Weakness of left leg     History reviewed. No pertinent family history.  Past Surgical History:  Procedure Laterality Date  . CERVICAL DISC SURGERY     x 2  . HIP ARTHROPLASTY     right  . JOINT REPLACEMENT    . KNEE ARTHROPLASTY     bilateral  . LUMBAR LAMINECTOMY/DECOMPRESSION MICRODISCECTOMY  08/31/2011   Procedure: LUMBAR LAMINECTOMY/DECOMPRESSION MICRODISCECTOMY;  Surgeon: Otilio Connors, MD;  Location: [facility];  Service: Neurosurgery;  Laterality: N/A;  LumbarThree to Lumbar Five Laminectomy, possible Lumbar Four-Five Diskectomy  . SHOULDER SURGERY     left   Social History   Occupational History  . Not on file  Tobacco Use  . Smoking status: Never Smoker  . Smokeless tobacco: Never Used  Substance and Sexual Activity  . Alcohol use: No  . Drug use: No  . Sexual activity: Not on file

## 2017-11-28 ENCOUNTER — Telehealth (INDEPENDENT_AMBULATORY_CARE_PROVIDER_SITE_OTHER): Payer: Self-pay | Admitting: Orthopaedic Surgery

## 2017-11-28 NOTE — Telephone Encounter (Signed)
Patient's wife called stating that the [place] website is down and she is unable to get the MRI report faxed over today.  Patient will try again tomorrow.

## 2017-12-01 ENCOUNTER — Encounter: Payer: Self-pay | Admitting: Orthopaedic Surgery

## 2017-12-01 DIAGNOSIS — M7502 Adhesive capsulitis of left shoulder: Secondary | ICD-10-CM

## 2017-12-01 DIAGNOSIS — M75122 Complete rotator cuff tear or rupture of left shoulder, not specified as traumatic: Secondary | ICD-10-CM | POA: Diagnosis not present

## 2017-12-01 DIAGNOSIS — M19012 Primary osteoarthritis, left shoulder: Secondary | ICD-10-CM | POA: Diagnosis not present

## 2017-12-01 DIAGNOSIS — M7542 Impingement syndrome of left shoulder: Secondary | ICD-10-CM

## 2017-12-05 ENCOUNTER — Encounter (INDEPENDENT_AMBULATORY_CARE_PROVIDER_SITE_OTHER): Payer: Self-pay | Admitting: Orthopaedic Surgery

## 2017-12-05 ENCOUNTER — Other Ambulatory Visit (INDEPENDENT_AMBULATORY_CARE_PROVIDER_SITE_OTHER): Payer: Self-pay | Admitting: *Deleted

## 2017-12-05 ENCOUNTER — Ambulatory Visit (INDEPENDENT_AMBULATORY_CARE_PROVIDER_SITE_OTHER): Payer: Medicare Other | Admitting: Orthopaedic Surgery

## 2017-12-05 VITALS — BP 153/76 | HR 59 | Resp 18 | Ht 73.5 in | Wt 243.0 lb

## 2017-12-05 DIAGNOSIS — G8929 Other chronic pain: Secondary | ICD-10-CM

## 2017-12-05 DIAGNOSIS — M75122 Complete rotator cuff tear or rupture of left shoulder, not specified as traumatic: Secondary | ICD-10-CM

## 2017-12-05 DIAGNOSIS — M25512 Pain in left shoulder: Secondary | ICD-10-CM

## 2017-12-05 NOTE — Progress Notes (Signed)
Office Visit Note   Patient: [name]           Date of Birth: [birth year]           MRN: [id number] Visit Date: 12/05/2017              Requested by: Rogers Blocker, MD [address] [place], [place] [address] PCP: Rogers Blocker, MD   Assessment & Plan: Visit Diagnoses:  1. Chronic left shoulder pain     Plan: 4 days status post rotator cuff tear repair left shoulder.  Doing well wearing the sling.  We will check on physical therapy availability as he is sponsored by the [place].  Office 1 week.  Had significant adhesive capsulitis pre-surgery exam under anesthesia.  Would like to start therapy as soon as possible to help prevent postop recurrence  Follow-Up Instructions: Return in about 1 week (around 12/12/2017).   Orders:  No orders of the defined types were placed in this encounter.  No orders of the defined types were placed in this encounter.     Procedures: No procedures performed   Clinical Data: No additional findings.   Subjective: Chief Complaint  Patient presents with  . Post-op Follow-up    POST OP 12/01/17 R SHOULDER PAIN, GOTTEN WORSE LAST NIGHT  Relatively comfortable 4 days postop.  Wearing the sling.  Denies fever or chills  HPI  Review of Systems  Constitutional: Negative for fatigue and fever.  HENT: Negative for ear pain.   Eyes: Negative for pain.  Respiratory: Negative for cough and shortness of breath.   Cardiovascular: Negative for leg swelling.  Gastrointestinal: Negative for constipation and diarrhea.  Genitourinary: Negative for difficulty urinating.  Musculoskeletal: Negative for back pain and neck pain.  Skin: Positive for rash.  Allergic/Immunologic: Negative for food allergies.  Neurological: Positive for weakness and numbness.  Psychiatric/Behavioral: Positive for sleep disturbance.     Objective: Vital Signs: BP (!) 153/76 (BP Location: Right Arm, Patient Position: Sitting, Cuff Size: Normal)   Pulse (!) 59    Resp 18   Ht 6' 1.5" (1.867 m)   Wt 243 lb (110.2 kg)   BMI 31.63 kg/m   Physical Exam  Ortho Exam awake alert and oriented x3.  Comfortable sitting.  Dressing removed from left shoulder.  Incisions clean and dry.  Having some stiffness in his hand but no numbness or tingling.  Specialty Comments:  No specialty comments available.  Imaging: No results found.   PMFS History: Patient Active Problem List   Diagnosis Date Noted  . Essential hypertension 11/25/2010  . Diabetes mellitus type 2 in nonobese ([place]) 11/25/2010  . 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 Degenerative joint disease involving multiple joints 11/25/2010  . Allergic rhinitis due to pollen 11/25/2010   Past Medical History:  Diagnosis Date  . Allergic rhinitis   . Anxiety   . Depression   . Diabetes mellitus   . DJD (degenerative joint disease)   . Hyperlipidemia   . Hypertension   . Hypothyroidism   . Mental disorder   . Narcolepsy    takes provigil  . Osteoporosis   . 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 Sleep apnea    cpac  . Weakness of left leg     History reviewed. No pertinent family history.  Past Surgical History:  Procedure Laterality Date  . CERVICAL DISC SURGERY  x 2  . HIP ARTHROPLASTY     right  . JOINT REPLACEMENT    . KNEE ARTHROPLASTY     bilateral  . LUMBAR LAMINECTOMY/DECOMPRESSION MICRODISCECTOMY  08/31/2011   Procedure: LUMBAR LAMINECTOMY/DECOMPRESSION MICRODISCECTOMY;  Surgeon: Otilio Connors, MD;  Location: [facility];  Service: Neurosurgery;  Laterality: N/A;  LumbarThree to Lumbar Five Laminectomy, possible Lumbar Four-Five Diskectomy  . SHOULDER SURGERY     left   Social History   Occupational History  . Not on file  Tobacco Use  . Smoking status: Never Smoker  . Smokeless tobacco: Never Used  Substance and Sexual Activity  . Alcohol use: No  . Drug use: No  . Sexual activity:  Not on file

## 2017-12-13 ENCOUNTER — Ambulatory Visit (INDEPENDENT_AMBULATORY_CARE_PROVIDER_SITE_OTHER): Payer: No Typology Code available for payment source | Admitting: Orthopaedic Surgery

## 2017-12-13 ENCOUNTER — Encounter (INDEPENDENT_AMBULATORY_CARE_PROVIDER_SITE_OTHER): Payer: Self-pay | Admitting: Orthopaedic Surgery

## 2017-12-13 VITALS — BP 158/76 | HR 61 | Resp 16 | Ht 73.5 in | Wt 242.0 lb

## 2017-12-13 DIAGNOSIS — M25512 Pain in left shoulder: Secondary | ICD-10-CM

## 2017-12-13 DIAGNOSIS — G8929 Other chronic pain: Secondary | ICD-10-CM

## 2017-12-13 NOTE — Progress Notes (Signed)
Office Visit Note   Patient: [name]           Date of Birth: [birth year]           MRN: [id number] Visit Date: 12/13/2017              Requested by: Rogers Blocker, MD [address] [place], [place] [address] PCP: Rogers Blocker, MD   Assessment & Plan: Visit Diagnoses:  1. Chronic left shoulder pain     Plan: 12 days status post rotator cuff tear repair, SCD and DCR left shoulder.  Doing well in the sling.  Has had 2 sessions of therapy.  Continue with a sling and will see in 3 weeks  Follow-Up Instructions: Return in about 3 weeks (around 01/03/2018).   Orders:  No orders of the defined types were placed in this encounter.  No orders of the defined types were placed in this encounter.     Procedures: No procedures performed   Clinical Data: No additional findings.   Subjective: Chief Complaint  Patient presents with  . Left Shoulder - Routine Post Op  . Follow-up    l shoulder rotator cuff tear surgery 12/01/17 still have pain and some numbness in fingers  12 days status post rotator cuff tear repair left shoulder.  Presently comfortable.  Has had 2 therapy sessions for passive range of motion.  Had a preoperative adhesive capsulitis.  Recently diagnosed with right carpal tunnel syndrome through the [facility] via conduction studies.  Presently being treated at the same facility for that problem  HPI  Review of Systems  Constitutional: Negative for fatigue and fever.  HENT: Negative for ear pain.   Eyes: Negative for pain.  Respiratory: Negative for cough and shortness of breath.   Cardiovascular: Negative for leg swelling.  Gastrointestinal: Negative for constipation and diarrhea.  Genitourinary: Negative for difficulty urinating.  Musculoskeletal: Positive for neck pain. Negative for back pain.  Skin: Negative for rash.  Allergic/Immunologic: Negative for food allergies.  Neurological: Positive for weakness and numbness.  Hematological:  Does not bruise/bleed easily.  Psychiatric/Behavioral: Positive for sleep disturbance.     Objective: Vital Signs: BP (!) 158/76 (BP Location: Left Arm, Patient Position: Sitting, Cuff Size: Normal)   Pulse 61   Resp 16   Ht 6' 1.5" (1.867 m)   Wt 242 lb (109.8 kg)   BMI 31.50 kg/m   Physical Exam  Ortho Exam awake alert and oriented x3.  Comfortable sitting.  Incisions left shoulder healed without problem.  Neurovascular exam intact.  Range of motion not attempted.  Comfortable in the sling.  Specialty Comments:  No specialty comments available.  Imaging: No results found.   PMFS History: Patient Active Problem List   Diagnosis Date Noted  . Essential hypertension 11/25/2010  . Diabetes mellitus type 2 in nonobese ([place]) 11/25/2010  . 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 Degenerative joint disease involving multiple joints 11/25/2010  . Allergic rhinitis due to pollen 11/25/2010   Past Medical History:  Diagnosis Date  . Allergic rhinitis   . Anxiety   . Carpal tunnel syndrome on right   . Depression   . Diabetes mellitus   . DJD (degenerative joint disease)   . Hyperlipidemia   . Hypertension   . Hypothyroidism   . Mental disorder   . Narcolepsy    takes provigil  . Osteoporosis   . Pituitary abnormality ([place])  pituitary edema .takes bromocriptine  . Sleep apnea    cpac  . Weakness of left leg     History reviewed. No pertinent family history.  Past Surgical History:  Procedure Laterality Date  . CERVICAL DISC SURGERY     x 2  . HIP ARTHROPLASTY     right  . JOINT REPLACEMENT    . KNEE ARTHROPLASTY     bilateral  . LUMBAR LAMINECTOMY/DECOMPRESSION MICRODISCECTOMY  08/31/2011   Procedure: LUMBAR LAMINECTOMY/DECOMPRESSION MICRODISCECTOMY;  Surgeon: Otilio Connors, MD;  Location: [facility];  Service: Neurosurgery;  Laterality: N/A;  LumbarThree to Lumbar Five Laminectomy, possible Lumbar Four-Five  Diskectomy  . SHOULDER SURGERY     left   Social History   Occupational History  . Not on file  Tobacco Use  . Smoking status: Never Smoker  . Smokeless tobacco: Never Used  Substance and Sexual Activity  . Alcohol use: No  . Drug use: No  . Sexual activity: Not on file

## 2018-01-02 ENCOUNTER — Ambulatory Visit (INDEPENDENT_AMBULATORY_CARE_PROVIDER_SITE_OTHER): Payer: No Typology Code available for payment source | Admitting: Orthopaedic Surgery

## 2018-01-02 ENCOUNTER — Encounter (INDEPENDENT_AMBULATORY_CARE_PROVIDER_SITE_OTHER): Payer: Self-pay | Admitting: Orthopaedic Surgery

## 2018-01-02 VITALS — BP 163/69 | HR 64 | Ht 73.5 in | Wt 240.0 lb

## 2018-01-02 DIAGNOSIS — M25512 Pain in left shoulder: Secondary | ICD-10-CM

## 2018-01-02 DIAGNOSIS — G8929 Other chronic pain: Secondary | ICD-10-CM

## 2018-01-02 NOTE — Progress Notes (Signed)
Office Visit Note   Patient: [name]           Date of Birth: 01-Jun-1951           MRN: [id number] Visit Date: 01/02/2018              Requested by: Rogers Blocker, MD [address] [place], [place] [address] PCP: Rogers Blocker, MD   Assessment & Plan: Visit Diagnoses:  1. Chronic left shoulder pain     Plan: Nearly 6 weeks status post rotator cuff tear repair left shoulder.  He had adhesive capsulitis preoperatively the.  This was manipulated.  Presently continues to wear a sling and going to physical therapy.  Not having much pain.  Does have limited overhead range of motion and will need more aggressive range of motion.  Long discussion with Mr. and Mrs. Sobczyk.  Check again in 3 weeks.  Spent a lot of time with Mrs. Buchmann regarding need for range of motion exercises outside of therapy.  Also discussed possible manipulation at some point future if he does not regain overhead motion and is uncomfortable  Follow-Up Instructions: Return in about 3 weeks (around 01/23/2018).   Orders:  No orders of the defined types were placed in this encounter.  No orders of the defined types were placed in this encounter.     Procedures: No procedures performed   Clinical Data: No additional findings.   Subjective: No chief complaint on file. Nearly 6 weeks status post rotator cuff tear repair of left shoulder.  Also had SCD and DCR.  Noted to have adhesive capsulitis preoperatively.  This was manipulated under anesthesia.  Presently doing well without much pain.  Wearing the sling.  Continues to be followed in physical therapy.  HPI  Review of Systems  Constitutional: Negative for fatigue.  HENT: Negative for ear pain.   Eyes: Negative for pain.  Respiratory: Negative for cough and shortness of breath.   Cardiovascular: Negative for leg swelling.  Gastrointestinal: Negative for constipation and diarrhea.  Genitourinary: Negative for difficulty urinating.    Musculoskeletal: Negative for back pain and neck pain.  Skin: Negative for rash.  Allergic/Immunologic: Negative for food allergies.  Neurological: Positive for weakness. Negative for numbness.  Hematological: Does not bruise/bleed easily.  Psychiatric/Behavioral: Negative for sleep disturbance.     Objective: Vital Signs: BP (!) 163/69 (BP Location: Right Arm, Patient Position: Sitting, Cuff Size: Normal)   Pulse 64   Ht 6' 1.5" (1.867 m)   Wt 240 lb (108.9 kg)   BMI 31.23 kg/m   Physical Exam  Ortho Exam awake alert and oriented x3.  Comfortable sitting.  Incisions left shoulder feel without problem.  I could abduct 90 degrees at which point he was tight.  I can flex about 100 degrees and he was tight.  Neurovascular exam intact.  Skin intact.  Specialty Comments:  No specialty comments available.  Imaging: No results found.   PMFS History: Patient Active Problem List   Diagnosis Date Noted  . Essential hypertension 11/25/2010  . Diabetes mellitus type 2 in nonobese ([facility]) 11/25/2010  . 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 Degenerative joint disease involving multiple joints 11/25/2010  . Allergic rhinitis due to pollen 11/25/2010   Past Medical History:  Diagnosis Date  . Allergic rhinitis   . Anxiety   . Carpal tunnel syndrome on right   . Depression   . Diabetes mellitus   .  DJD (degenerative joint disease)   . Hyperlipidemia   . Hypertension   . Hypothyroidism   . Mental disorder   . Narcolepsy    takes provigil  . Osteoporosis   . 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 Sleep apnea    cpac  . Weakness of left leg     History reviewed. No pertinent family history.  Past Surgical History:  Procedure Laterality Date  . CERVICAL DISC SURGERY     x 2  . HIP ARTHROPLASTY     right  . JOINT REPLACEMENT    . KNEE ARTHROPLASTY     bilateral  . LUMBAR LAMINECTOMY/DECOMPRESSION  MICRODISCECTOMY  08/31/2011   Procedure: LUMBAR LAMINECTOMY/DECOMPRESSION MICRODISCECTOMY;  Surgeon: Otilio Connors, MD;  Location: [facility];  Service: Neurosurgery;  Laterality: N/A;  LumbarThree to Lumbar Five Laminectomy, possible Lumbar Four-Five Diskectomy  . SHOULDER SURGERY     left   Social History   Occupational History  . Not on file  Tobacco Use  . Smoking status: Never Smoker  . Smokeless tobacco: Never Used  Substance and Sexual Activity  . Alcohol use: No  . Drug use: No  . Sexual activity: Not on file

## 2018-01-20 ENCOUNTER — Encounter (INDEPENDENT_AMBULATORY_CARE_PROVIDER_SITE_OTHER): Payer: Self-pay | Admitting: Orthopaedic Surgery

## 2018-01-20 ENCOUNTER — Ambulatory Visit (INDEPENDENT_AMBULATORY_CARE_PROVIDER_SITE_OTHER): Payer: No Typology Code available for payment source | Admitting: Orthopaedic Surgery

## 2018-01-20 VITALS — BP 161/77 | HR 54 | Ht 73.5 in | Wt 245.0 lb

## 2018-01-20 DIAGNOSIS — M25512 Pain in left shoulder: Secondary | ICD-10-CM

## 2018-01-20 DIAGNOSIS — G8929 Other chronic pain: Secondary | ICD-10-CM

## 2018-01-20 NOTE — Progress Notes (Signed)
Office Visit Note   Patient: [name]           Date of Birth: [birth year]           MRN: [id number] Visit Date: 01/20/2018              Requested by: Rogers Blocker, MD [address] [place], [place] [address] PCP: Rogers Blocker, MD   Assessment & Plan: Visit Diagnoses:  1. Chronic left shoulder pain     Plan: Approximately 7 weeks status post rotator cuff tear repair of left shoulder with SCD, DCR and shoulder manipulation for adhesive capsulitis.  Doing well.  Continues to be followed in physical therapy.  Urge continued with strengthening and range of motion exercises.  Office 1 month.  Patient is retired  Follow-Up Instructions: Return in about 1 month (around 02/19/2018).   Orders:  No orders of the defined types were placed in this encounter.  No orders of the defined types were placed in this encounter.     Procedures: No procedures performed   Clinical Data: No additional findings.   Subjective: Chief Complaint  Patient presents with  . Post-op Follow-up    3 WK F/U LEFT SHOULDER, DOING GOOD NO ISSUES    HPI  Review of Systems  Constitutional: Negative for fatigue and fever.  HENT: Negative for ear pain.   Eyes: Negative for pain.  Respiratory: Negative for cough and shortness of breath.   Cardiovascular: Negative for leg swelling.  Gastrointestinal: Negative for constipation and diarrhea.  Genitourinary: Negative for difficulty urinating.  Musculoskeletal: Positive for neck pain. Negative for back pain.  Skin: Negative for rash.  Allergic/Immunologic: Negative for food allergies.  Neurological: Positive for weakness and numbness.  Hematological: Does not bruise/bleed easily.  Psychiatric/Behavioral: Positive for sleep disturbance.     Objective: Vital Signs: BP (!) 161/77 (BP Location: Right Arm, Patient Position: Sitting, Cuff Size: Normal)   Pulse (!) 54   Ht 6' 1.5" (1.867 m)   Wt 245 lb (111.1 kg)   BMI 31.89 kg/m    Physical Exam  Ortho Exam awake alert and oriented x3.  Comfortable sitting.  I could almost fully place his left arm over his head.  Little bit tight.  Negative impingement and empty can testing.  Abduction actively to 90 degrees  Specialty Comments:  No specialty comments available.  Imaging: No results found.   PMFS History: Patient Active Problem List   Diagnosis Date Noted  . Essential hypertension 11/25/2010  . Diabetes mellitus type 2 in nonobese ([facility]) 11/25/2010  . 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 Degenerative joint disease involving multiple joints 11/25/2010  . Allergic rhinitis due to pollen 11/25/2010   Past Medical History:  Diagnosis Date  . Allergic rhinitis   . Anxiety   . Carpal tunnel syndrome on right   . Depression   . Diabetes mellitus   . DJD (degenerative joint disease)   . Hyperlipidemia   . Hypertension   . Hypothyroidism   . Mental disorder   . Narcolepsy    takes provigil  . Osteoporosis   . 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 Sleep apnea    cpac  . Weakness of left leg     History reviewed. No pertinent family history.  Past Surgical History:  Procedure Laterality Date  . CERVICAL DISC SURGERY     x 2  . HIP  ARTHROPLASTY     right  . JOINT REPLACEMENT    . KNEE ARTHROPLASTY     bilateral  . LUMBAR LAMINECTOMY/DECOMPRESSION MICRODISCECTOMY  08/31/2011   Procedure: LUMBAR LAMINECTOMY/DECOMPRESSION MICRODISCECTOMY;  Surgeon: Otilio Connors, MD;  Location: [facility];  Service: Neurosurgery;  Laterality: N/A;  LumbarThree to Lumbar Five Laminectomy, possible Lumbar Four-Five Diskectomy  . SHOULDER SURGERY     left   Social History   Occupational History  . Not on file  Tobacco Use  . Smoking status: Never Smoker  . Smokeless tobacco: Never Used  Substance and Sexual Activity  . Alcohol use: No  . Drug use: No  . Sexual activity: Not on file

## 2018-02-24 ENCOUNTER — Encounter (INDEPENDENT_AMBULATORY_CARE_PROVIDER_SITE_OTHER): Payer: Self-pay | Admitting: Orthopaedic Surgery

## 2018-02-24 ENCOUNTER — Ambulatory Visit (INDEPENDENT_AMBULATORY_CARE_PROVIDER_SITE_OTHER): Payer: No Typology Code available for payment source | Admitting: Orthopaedic Surgery

## 2018-02-24 VITALS — BP 161/81 | HR 65 | Ht 73.5 in | Wt 243.0 lb

## 2018-02-24 DIAGNOSIS — G8929 Other chronic pain: Secondary | ICD-10-CM

## 2018-02-24 DIAGNOSIS — M25512 Pain in left shoulder: Secondary | ICD-10-CM

## 2018-02-24 NOTE — Progress Notes (Signed)
Office Visit Note   Patient: [name]           Date of Birth: Jan 06, 1951           MRN: [id number] Visit Date: 02/24/2018              Requested by: Rogers Blocker, MD [address] [place], [place] [address] PCP: Rogers Blocker, MD   Assessment & Plan: Visit Diagnoses:  1. Chronic left shoulder pain     Plan: Nearly 3 months status post rotator cuff tear repair left shoulder doing very well.  Very pleased with the results.  Long discussion regarding exercises and being careful.  Needs to continue to strengthen left shoulder musculature.  We will plan to see back as needed  Follow-Up Instructions: Return if symptoms worsen or fail to improve.   Orders:  No orders of the defined types were placed in this encounter.  No orders of the defined types were placed in this encounter.     Procedures: No procedures performed   Clinical Data: No additional findings.   Subjective: Chief Complaint  Patient presents with  . Follow-up    SURGERY 12/01/17 THINGS GOING GOOD L SHOULDER ROTATOR CUFF TEAR  Doing very well nearly 3 months post rotator cuff tear repair of his left shoulder.  He has regained full overhead motion.  No significant discomfort.  Denies any numbness or tingling.  HPI  Review of Systems  Constitutional: Negative for fatigue and fever.  HENT: Negative for ear pain.   Eyes: Negative for pain.  Respiratory: Negative for cough and shortness of breath.   Cardiovascular: Negative for leg swelling.  Gastrointestinal: Negative for constipation and diarrhea.  Genitourinary: Negative for difficulty urinating.  Musculoskeletal: Positive for back pain. Negative for neck pain.  Skin: Positive for rash.  Allergic/Immunologic: Negative for food allergies.  Neurological: Negative for weakness and numbness.  Hematological: Bruises/bleeds easily.  Psychiatric/Behavioral: Positive for sleep disturbance.     Objective: Vital Signs: BP (!) 161/81 (BP  Location: Left Arm, Patient Position: Sitting, Cuff Size: Normal)   Pulse 65   Ht 6' 1.5" (1.867 m)   Wt 243 lb (110.2 kg)   BMI 31.63 kg/m   Physical Exam  Ortho Exam awake alert and oriented x3.  Comfortable sitting.  Accompanied by his wife.  Full quick overhead motion of his left shoulder.  No evidence of instability or adhesive capsulitis.  Incisions of healed.  Still has a little bit of weakness as expected.  Negative impingement.  Negative empty can testing.  Skin intact.  Specialty Comments:  No specialty comments available.  Imaging: No results found.   PMFS History: Patient Active Problem List   Diagnosis Date Noted  . Essential hypertension 11/25/2010  . Diabetes mellitus type 2 in nonobese ([facility]) 11/25/2010  . 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 Degenerative joint disease involving multiple joints 11/25/2010  . Allergic rhinitis due to pollen 11/25/2010   Past Medical History:  Diagnosis Date  . Allergic rhinitis   . Anxiety   . Carpal tunnel syndrome on right   . Depression   . Diabetes mellitus   . DJD (degenerative joint disease)   . Hyperlipidemia   . Hypertension   . Hypothyroidism   . Mental disorder   . Narcolepsy    takes provigil  . Osteoporosis   . 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 Sleep  apnea    cpac  . Weakness of left leg     History reviewed. No pertinent family history.  Past Surgical History:  Procedure Laterality Date  . CERVICAL DISC SURGERY     x 2  . HIP ARTHROPLASTY     right  . JOINT REPLACEMENT    . KNEE ARTHROPLASTY     bilateral  . LUMBAR LAMINECTOMY/DECOMPRESSION MICRODISCECTOMY  08/31/2011   Procedure: LUMBAR LAMINECTOMY/DECOMPRESSION MICRODISCECTOMY;  Surgeon: Otilio Connors, MD;  Location: [facility];  Service: Neurosurgery;  Laterality: N/A;  LumbarThree to Lumbar Five Laminectomy, possible Lumbar Four-Five Diskectomy  . SHOULDER  SURGERY     left   Social History   Occupational History  . Not on file  Tobacco Use  . Smoking status: Never Smoker  . Smokeless tobacco: Never Used  Substance and Sexual Activity  . Alcohol use: No  . Drug use: No  . Sexual activity: Not on file

## 2018-04-13 ENCOUNTER — Ambulatory Visit (INDEPENDENT_AMBULATORY_CARE_PROVIDER_SITE_OTHER): Payer: No Typology Code available for payment source | Admitting: Pulmonary Disease

## 2018-04-13 ENCOUNTER — Encounter: Payer: Self-pay | Admitting: Pulmonary Disease

## 2018-04-13 VITALS — BP 160/100 | HR 61 | Ht 73.0 in | Wt 246.0 lb

## 2018-04-13 DIAGNOSIS — G47421 Narcolepsy in conditions classified elsewhere with cataplexy: Secondary | ICD-10-CM | POA: Diagnosis not present

## 2018-04-13 DIAGNOSIS — F4310 Post-traumatic stress disorder, unspecified: Secondary | ICD-10-CM | POA: Diagnosis not present

## 2018-04-13 DIAGNOSIS — G4733 Obstructive sleep apnea (adult) (pediatric): Secondary | ICD-10-CM

## 2018-04-13 NOTE — Progress Notes (Signed)
[name]    [id number]    May 05, 1951  Primary Care Physician:Dean, Carolann Littler, MD  Referring Physician: Rogers Blocker, [address], [place] [address]  Chief complaint:   Patient with a history of narcolepsy, obstructive sleep apnea Compliant with CPAP, compliant with stimulant medications  HPI:  He was diagnosed in 2007 following a sleep study and M SLT Diagnosed with narcolepsy Has been compliant with CPAP use Compliant with stimulant use currently using modafinil 200 mg in the morning and 100 mg in the afternoon Will usually take an hour to 2 hours of nap time, he is unable to function without the naps Spouse notes confusion, forgetfulness without the naps He feels he functions well with his current schedule Usually goes to bed about 12- 1 AM , falls asleep about 2 AM-he does watch TV prior to falling asleep, sometimes falls asleep without having his CPAP on He does take at night sedative Wakes up usually about 6:30 in the morning He will take his medications unable to function up till about midday when he requires a nap which is scheduled on a regular basis-sleeps for about 1 to 2 hours, occasionally sleeps for longer depending on how tired he feels Once awake, he takes his modafinil and able to function for the rest of the evening He has no difficulty sleeping at night He has no concerns about his CPAP use  History of PTSD  Exposures: No significant exposures Smoking history: Never smoker   Outpatient Encounter Medications as of 04/13/2018  Medication Sig  . bromocriptine (PARLODEL) 5 MG capsule Take 5 mg by mouth 3 (three) times daily.    . Cholecalciferol (VITAMIN D3) 1000 UNITS CAPS Take by mouth every morning.    . diclofenac (VOLTAREN) 75 MG EC tablet Take 75 mg by mouth 2 (two) times daily as needed.  . fexofenadine (ALLEGRA) 180 MG tablet Take 180 mg by mouth daily.    . flunisolide (AEROBID) 250 MCG/ACT inhaler Inhale 2 puffs  into the lungs 2 (two) times daily.   . flunisolide (NASAREL) 29 MCG/ACT (0.025%) nasal spray 2 sprays by Nasal route 2 (two) times daily. Dose is for each nostril.   . insulin regular (HUMULIN R,NOVOLIN R) 100 UNIT/ML injection Inject 10 Units into the skin as needed. Insulin Reg Human Novalin R100 Unit/ML  10 units daily IF blood sugar over 140  . Levothyroxine Sodium (SYNTHROID PO) Take 0.025 mg by mouth daily.    . metFORMIN (GLUCOPHAGE) 500 MG tablet Take 500 mg by mouth 2 (two) times daily.    . modafinil (PROVIGIL) 200 MG tablet Take 300 mg by mouth daily.   . Omega-3 Fatty Acids (FISH OIL PO) Take 1,000 mg by mouth 3 (three) times daily.   Marland Kitchen PARoxetine (PAXIL) 40 MG tablet Take 40 mg by mouth at bedtime.    . polyvinyl alcohol-povidone (HYPOTEARS) 1.4-0.6 % ophthalmic solution Place 1 drop into both eyes 3 (three) times daily.   . pravastatin (PRAVACHOL) 20 MG tablet Take 20 mg by mouth at bedtime.   . temazepam (RESTORIL) 15 MG capsule Take 30 mg by mouth at bedtime.  . temazepam (RESTORIL) 15 MG capsule Take by mouth.  . triamterene-hydrochlorothiazide (MAXZIDE) 75-50 MG per tablet Take 0.5 tablets by mouth daily.  . valsartan (DIOVAN) 160 MG tablet Take 160 mg by mouth 2 (two) times daily.   . verapamil (CALAN) 120 MG tablet Take 120 mg by  mouth daily.    . verapamil (COVERA HS) 180 MG (CO) 24 hr tablet Take 180 mg by mouth daily.    . vitamin E 400 UNIT capsule Take 400 Units by mouth 2 (two) times daily.    . [DISCONTINUED] oxyCODONE-acetaminophen (PERCOCET/ROXICET) 5-325 MG tablet    No facility-administered encounter medications on file as of 04/13/2018.     Allergies as of 04/13/2018 - Review Complete 04/13/2018  Allergen Reaction Noted  . Ace inhibitors Swelling 08/27/2011  . Sulfa antibiotics Swelling 08/27/2011  . Sulfamethoxazole Swelling 05/06/2016    Past Medical History:  Diagnosis Date  . Allergic rhinitis   . Anxiety   . Carpal tunnel syndrome on right   .  Depression   . Diabetes mellitus   . DJD (degenerative joint disease)   . Hyperlipidemia   . Hypertension   . Hypothyroidism   . Mental disorder   . Narcolepsy    takes provigil  . Osteoporosis   . 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 Sleep apnea    cpac  . Weakness of left leg     Past Surgical History:  Procedure Laterality Date  . CERVICAL DISC SURGERY     x 2  . HIP ARTHROPLASTY     right  . JOINT REPLACEMENT    . KNEE ARTHROPLASTY     bilateral  . LUMBAR LAMINECTOMY/DECOMPRESSION MICRODISCECTOMY  08/31/2011   Procedure: LUMBAR LAMINECTOMY/DECOMPRESSION MICRODISCECTOMY;  Surgeon: Otilio Connors, MD;  Location: [facility];  Service: Neurosurgery;  Laterality: N/A;  LumbarThree to Lumbar Five Laminectomy, possible Lumbar Four-Five Diskectomy  . SHOULDER SURGERY     left    No family history on file.  Social History   Socioeconomic History  . Marital status: Married    Spouse name: Not on file  . Number of children: Not on file  . Years of education: Not on file  . Highest education level: Not on file  Occupational History  . Not on file  Social Needs  . Financial resource strain: Not on file  . Food insecurity:    Worry: Not on file    Inability: Not on file  . Transportation needs:    Medical: Not on file    Non-medical: Not on file  Tobacco Use  . Smoking status: Never Smoker  . Smokeless tobacco: Never Used  Substance and Sexual Activity  . Alcohol use: No  . Drug use: No  . Sexual activity: Not on file  Lifestyle  . Physical activity:    Days per week: Not on file    Minutes per session: Not on file  . Stress: Not on file  Relationships  . Social connections:    Talks on phone: Not on file    Gets together: Not on file    Attends religious service: Not on file    Active member of club or organization: Not on file    Attends meetings of clubs or organizations: Not on file    Relationship status: Not on file   . Intimate partner violence:    Fear of current or ex partner: Not on file    Emotionally abused: Not on file    Physically abused: Not on file    Forced sexual activity: Not on file  Other Topics Concern  . Not on file  Social History Narrative  . Not on file    Review of Systems  Constitutional: Negative for activity change and appetite  change.  HENT: Negative.   Eyes: Negative.   Respiratory: Positive for apnea. Negative for cough, choking and shortness of breath.   Cardiovascular: Negative.   Endocrine: Negative.   Genitourinary: Negative.   Allergic/Immunologic: Negative.   Neurological: Negative.   Hematological: Negative.     Vitals:   04/13/18 1510  BP: (!) 160/100  Pulse: 61  SpO2: 97%     Physical Exam  Constitutional: He is oriented to person, place, and time. He appears well-developed and well-nourished. No distress.  HENT:  Head: Normocephalic and atraumatic.  Eyes: Pupils are equal, round, and reactive to light. Conjunctivae and EOM are normal. Right eye exhibits no discharge. Left eye exhibits no discharge.  Neck: Normal range of motion. Neck supple. No tracheal deviation present. No thyromegaly present.  Cardiovascular: Normal rate and regular rhythm.  Pulmonary/Chest: Effort normal and breath sounds normal. No respiratory distress. He has no wheezes.  Abdominal: Soft. Bowel sounds are normal. He exhibits no distension. There is no tenderness.  Musculoskeletal: Normal range of motion. He exhibits no edema or deformity.  Neurological: He is alert and oriented to person, place, and time. No cranial nerve deficit. Coordination normal.  Skin: Skin is warm and dry. He is not diaphoretic. No erythema.  Psychiatric: His behavior is normal.   Data Reviewed: Sleep study from November 2007 noted Compliance data present did review 87% compliance, residual AHI of 9.6 Auto titrating CPAP pressure setting between 10 and 20 with a median pressure of  13.5  Assessment:  1.  Obstructive sleep apnea-adequately treated  2.  Narcolepsy-appears to have a schedule that works for him at present, will respect to his stimulants and also is scheduled nap  3.  PTSD history  Plan/Recommendations: 1.  Continue modafinil, 200 every morning and 100 every afternoon  2.  Encouraged to limit is naps to as minimal number of minutes to hours to help some function well  3.  Encouraged to try and get more sleep at night  4.  Encouraged to optimize his sleep hygiene  Mr. Charlot is pretty set in the way he does things, getting his nightly routine before bed and he is routine during the day.  I am not sure he will make significant changes and as long as he continues to function well with his current routine-I will not stress any significant changes Did encourage him to get more sleep time at night.  I will be glad to see him again in about 6 months  He should continue his current doses of modafinil This may be adjusted as needed to maintain alertness during daytime hours He is currently on 200 and 100, may be adjusted to a schedule to keep him functioning well to maximum 400 a day  Sherrilyn Rist MD [facility] 04/13/2018, 4:11 PM  CC: Rogers Blocker, MD

## 2018-04-13 NOTE — Patient Instructions (Signed)
Patient with a history of narcolepsy  Obstructive sleep apnea which is adequately treated  Inadequate sleep  Functioning well at present with scheduled medications, scheduled daytime nap  I will recommend to continue with current therapy  Continue using CPAP as usual  Encourage more number of hours of sleep at night-7 to 8 hours is optimal  Continue modafinil, 200 mg in the morning and 100 mg in the early afternoon  I will see you back in the office in 6 months  Call if any changes/questions

## 2018-06-28 ENCOUNTER — Telehealth: Payer: Self-pay | Admitting: Pulmonary Disease

## 2018-06-28 DIAGNOSIS — G4733 Obstructive sleep apnea (adult) (pediatric): Secondary | ICD-10-CM

## 2018-06-28 NOTE — Telephone Encounter (Signed)
Spoke with pt's wife, she states the [place] approved the sleep study to be done since he needs an updated test. Dr. Jenetta Downer, did you want to go ahead and order this?

## 2018-07-03 NOTE — Telephone Encounter (Signed)
Patient wife returned phone call; contact [phone]

## 2018-07-03 NOTE — Telephone Encounter (Signed)
Dr. Ander Slade, please advise if you want [place] to order a sleep study for pt per the [facility]. Thanks!

## 2018-07-03 NOTE — Telephone Encounter (Signed)
Left message for patient's spouse to call back.  

## 2018-07-03 NOTE — Telephone Encounter (Signed)
Called patient unable to reach left message to give us a call back.

## 2018-07-04 NOTE — Telephone Encounter (Signed)
Attempted to call pt's wife [name] but unable to reach her. Left message for [name] to return call.

## 2018-07-05 NOTE — Telephone Encounter (Signed)
Called patients wife, unable to reach. Left message to give us a call back. 

## 2018-07-05 NOTE — Telephone Encounter (Signed)
Patient wife returned phone call; contact # [phone]

## 2018-07-06 NOTE — Telephone Encounter (Signed)
Called and spoke with pt's wife [name] letting her know that AO stated to go ahead and order the study for pt due to it being approved by the [place].  Stated to her that pt would have a polysomnogram and then the MSLT study the next day (I clarified with her that this is the study where pt would have to go sleep overnight in the sleep center to have this performed).  I stated to her that pt would have to not take the modafinil (Provigil) med 1 week prior to the study so that way we could be able to get accurate results.  [name] expressed understanding. Order has been placed for the MSLT. Nothing further needed.

## 2018-07-06 NOTE — Addendum Note (Signed)
Addended by: Lorretta Harp on: 07/06/2018 09:31 AM   Modules accepted: Orders

## 2018-08-15 ENCOUNTER — Telehealth: Payer: Self-pay | Admitting: Pulmonary Disease

## 2018-08-15 NOTE — Telephone Encounter (Signed)
Called and spoke with patients wife, she stated that the patient is scheduled to have his sleep study done on 08/27/17. Patient is currently on Gabapentin 300mg  TID. She is wanting to know when he needs to hold this medication if he does. AO please advise, thank you.

## 2018-08-17 NOTE — Telephone Encounter (Signed)
Attempted to contact pt's wife, [name]. I did not receive an answer. I have left a message for her to return our call.

## 2018-08-17 NOTE — Telephone Encounter (Signed)
He does not need to hold the medication  He should continue with all his routine medications

## 2018-08-18 NOTE — Telephone Encounter (Signed)
Called and spoke with Patient's Wife, Hassan Rowan.  Dr. Ander Slade recommendations given.  Understanding stated.  Nothing further at this time.  Per Dr. Ander Slade- He does not need to hold the medication  He should continue with all his routine medications

## 2018-08-27 ENCOUNTER — Ambulatory Visit (HOSPITAL_BASED_OUTPATIENT_CLINIC_OR_DEPARTMENT_OTHER): Payer: No Typology Code available for payment source | Attending: Pulmonary Disease | Admitting: Pulmonary Disease

## 2018-08-27 VITALS — Ht 73.5 in | Wt 245.0 lb

## 2018-08-27 DIAGNOSIS — G4733 Obstructive sleep apnea (adult) (pediatric): Secondary | ICD-10-CM | POA: Diagnosis present

## 2018-08-28 ENCOUNTER — Ambulatory Visit (HOSPITAL_BASED_OUTPATIENT_CLINIC_OR_DEPARTMENT_OTHER): Payer: No Typology Code available for payment source | Attending: Pulmonary Disease | Admitting: Pulmonary Disease

## 2018-08-28 DIAGNOSIS — G4733 Obstructive sleep apnea (adult) (pediatric): Secondary | ICD-10-CM | POA: Diagnosis present

## 2018-08-31 ENCOUNTER — Telehealth: Payer: Self-pay | Admitting: Pulmonary Disease

## 2018-08-31 NOTE — Procedures (Signed)
POLYSOMNOGRAPHY  Last, First: [name], [name] MRN: [id number] Gender: Male Age (years): 7 Weight (lbs): 245 DOB: April 29, 1951 BMI: 32 Primary Care: No PCP Epworth Score: 17 Referring: Laurin Coder MD Technician: Jacolyn Reedy Interpreting: Laurin Coder MD Study Type: MSLT Ordered Study Type: MSLT Study date: 08/28/2018 Location: [place] CLINICAL INFORMATION The patient was referred to the sleep center for evaluation of N/A. Indications include N/A. MEDICATIONS Patient self administered medications include: TEMAZEPAM, GABAPENTIN, PAROXETINE. Medications administered during study include No sleep medicine administered.  SLEEP STUDY TECHNIQUE A multiple sleep latency test was performed. The channels recorded and monitored were central and occipital EEG, electrooculogram (EOG), submentalis EMG (chin), and electrocardiogram. TECHNICIAN COMMENTS Comments added by Technician: NONE Comments added by Scorer: N/A  IMPRESSIONS - No sleep onset REMs present. This study does not suggest narcolepsy. - Total number of naps attempted: 5 . Total number of naps with sleep attained: - This does not suggest pathologic sleepiness with a mean s;eep latency of 11.61minutes.  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   RECOMMENDATIONS - Return for follow up to evaluate other causes of excessive daytime sleepiness. -   [Electronically signed] 08/31/2018 08:39 PM  Sherrilyn Rist MD NPI: [id number]

## 2018-08-31 NOTE — Telephone Encounter (Signed)
Sleep study result  M SLT did not show pathological sleepiness Does not suggest a diagnosis of narcolepsy  Continue auto titrating CPAP use for moderate obstructive sleep apnea  Follow-up as scheduled

## 2018-08-31 NOTE — Procedures (Signed)
POLYSOMNOGRAPHY  Last, First: Erhardt, Dada MRN: [id number] Gender: Male Age (years): 68 Weight (lbs): 245 DOB: 1951/01/10 BMI: 32 Primary Care: No PCP Epworth Score: 17 Referring: Laurin Coder MD Technician: Rebekah Chesterfield Interpreting: Laurin Coder MD Study Type: NPSG Ordered Study Type: NPSG Study date: 08/27/2018 Location: [place] CLINICAL INFORMATION [name] is a 68 year old Male and was referred to the sleep center for evaluation of G47.33 OSA: Adult and Pediatric (327.23). Indications include Daytime Fatigue, Fatigue, Hypertension, Obesity.  MEDICATIONS Patient self administered medications include: TEMAZEPAM, GABAPENTIN, PAROXETINE. Medications administered during study include No sleep medicine administered.  SLEEP STUDY TECHNIQUE A multi-channel overnight Polysomnography study was performed. The channels recorded and monitored were central and occipital EEG, electrooculogram (EOG), submentalis EMG (chin), nasal and oral airflow, thoracic and abdominal wall motion, anterior tibialis EMG, snore microphone, electrocardiogram, and a pulse oximetry. TECHNICIAN COMMENTS Comments added by Technician: Patient had difficulty initiating sleep. Comments added by Scorer: N/A SLEEP ARCHITECTURE The study was initiated at 10:41:24 PM and terminated at 5:57:35 AM. The total recorded time was 436.2 minutes. EEG confirmed total sleep time was 300 minutes yielding a sleep efficiency of 68.8%%. Sleep onset after lights out was 3.0 minutes with a REM latency of 144.5 minutes. The patient spent 32.3%% of the night in stage N1 sleep, 61.0%% in stage N2 sleep, 0.0%% in stage N3 and 6.7% in REM. Wake after sleep onset (WASO) was 133.2 minutes. The Arousal Index was 11.0/hour. RESPIRATORY PARAMETERS There were a total of 31 respiratory disturbances out of which 25 were apneas ( 0 obstructive, 1 mixed, 24 central) and 6 hypopneas. The apnea/hypopnea index (AHI) was 6.2  events/hour. The central sleep apnea index was 4.8 events/hour. The REM AHI was 0.0 events/hour and NREM AHI was 6.6 events/hour. The supine AHI was 2.5 events/hour and the non supine AHI was 81.4 supine during 95.33% of sleep. Respiratory disturbances were associated with oxygen desaturation down to a nadir of 81.0% during sleep. The mean oxygen saturation during the study was 97.7%. The cumulative time under 88% oxygen saturation was 5.5 minutes.  LEG MOVEMENT DATA The total leg movements were 0 with a resulting leg movement index of 0.0/hr .Associated arousal with leg movement index was 0.0/hr.  CARDIAC DATA The underlying cardiac rhythm was most consistent with sinus rhythm. Mean heart rate during sleep was 51.0 bpm. Additional rhythm abnormalities include None.   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 - Moderate Oxygen Desaturation - The patient snored with soft snoring volume. - No significant periodic leg movements(PLMs) during sleep.    DIAGNOSIS - Central Sleep Apnea    RECOMMENDATIONS - Very mild central sleep apnea. Return to discuss treatment options. Patient transitioned to an MSLT following this study. - Avoid alcohol, sedatives and other CNS depressants that may worsen sleep apnea and disrupt normal sleep architecture. - Sleep hygiene should be reviewed to assess factors that may improve sleep quality. - Weight management and regular exercise should be initiated or continued.  [Electronically signed] 08/31/2018 08:07 PM  Sherrilyn Rist MD NPI: [id number]

## 2018-09-01 NOTE — Telephone Encounter (Signed)
LMOM X1 

## 2018-09-04 NOTE — Telephone Encounter (Signed)
Called and spoke with the patients wife she states that she is concerned that when the patient came off medication he was unable to stay wake and was unable to drive. She believes the test is wrong and would like to know if patient can stay on medication.

## 2018-09-04 NOTE — Telephone Encounter (Signed)
Patient's wife is calling back stating she had told Nira Conn that patient had stopped taking modafinil medication a week before his test, but he actually stopped taking it on 08/13/18 (he did not take it on 08/13/18 and test was on 01/12.  CB is [phone].

## 2018-09-04 NOTE — Telephone Encounter (Signed)
Spoke with pt's wife, [name]. I attempted to give the results and the pt's wife became argumentative. She wants to speak to Dr. Judson Roch nurse only.

## 2018-09-04 NOTE — Telephone Encounter (Signed)
Patient's wife, [name], returning call.  CB is [phone].

## 2018-09-05 NOTE — Telephone Encounter (Signed)
Called and spoke with Dr. Ander Slade he advised that he would like the patient to stay on medication and f/u in 2 months. Patient wife aware and apt made.

## 2018-09-14 DIAGNOSIS — M48061 Spinal stenosis, lumbar region without neurogenic claudication: Secondary | ICD-10-CM

## 2018-09-14 HISTORY — DX: Spinal stenosis, lumbar region without neurogenic claudication: M48.061

## 2018-11-09 ENCOUNTER — Ambulatory Visit: Payer: Non-veteran care | Admitting: Pulmonary Disease

## 2018-11-13 ENCOUNTER — Ambulatory Visit: Payer: Non-veteran care | Admitting: Pulmonary Disease

## 2019-01-15 ENCOUNTER — Telehealth: Payer: Self-pay | Admitting: Pulmonary Disease

## 2019-01-15 NOTE — Telephone Encounter (Signed)
Called & spoke w/ pt wife, [name] (on [place]). Pt wife is inquiring on pt's sleep medication modafinil. She states pt has been sleeping more than usual during the day and is wondering if he needs a dosage adjustment. She notes [facility]. Pt is currently an AO pt and was supposed to have a f/u in 2 months from 09/05/2018, however, appt was cancelled d/t COVID.   I informed pt wife that AO is currently out of the office taking care of COVID pts in the hospital. I offered to set up a face-to-face or virtual visit w/ a nurse practitioner and pt wife agreed to these measures.   MyChart visit has been scheduled for tomorrow 01/16/2019 at 1:30 PM w/ [name]. Routing to SG as an [place]. Nothing further needed at this time.

## 2019-01-16 ENCOUNTER — Telehealth (INDEPENDENT_AMBULATORY_CARE_PROVIDER_SITE_OTHER): Payer: No Typology Code available for payment source | Admitting: Acute Care

## 2019-01-16 ENCOUNTER — Encounter: Payer: Self-pay | Admitting: Acute Care

## 2019-01-16 DIAGNOSIS — Z9989 Dependence on other enabling machines and devices: Secondary | ICD-10-CM

## 2019-01-16 DIAGNOSIS — G4733 Obstructive sleep apnea (adult) (pediatric): Secondary | ICD-10-CM | POA: Diagnosis not present

## 2019-01-16 DIAGNOSIS — G47429 Narcolepsy in conditions classified elsewhere without cataplexy: Secondary | ICD-10-CM

## 2019-01-16 DIAGNOSIS — G4719 Other hypersomnia: Secondary | ICD-10-CM | POA: Diagnosis not present

## 2019-01-16 NOTE — Addendum Note (Signed)
Addended by: [name] F on: 01/16/2019 04:22 PM   Modules accepted: Level of Service

## 2019-01-16 NOTE — Progress Notes (Signed)
Virtual Visit via Video Note  I connected with [name] on 01/16/19 at  1:30 PM EDT by a video enabled telemedicine application and verified that I am speaking with the correct person using two identifiers.  Location: Patient: [name] Provider: At the office   I discussed the limitations of evaluation and management by telemedicine and the availability of in person appointments. The patient expressed understanding and agreed to proceed.  I  confirmed date of birth and address to authenticate  Identity. My nurse Lattie Haw  reviewed medications and ordered any refills required.  History of Present Illness: Pt. Presents for follow up visit. Pt. Wife has requested this visit as the patient has been significantly more sleepy on his 300 mg of Modafinil . She is requesting an increase in dose of his Modafinil. Upon review of the patient's med list, the patient is on several sedating medications that he takes for pain and sleep, and  he does not have good control with his CPAP as he is having 11 events per hour. These are most likely both contributing to his increased sleepiness.  I will need to discuss with Dr. Ander Slade how he wants to manage this. The patient needs his CPAP control improved and he may need to decrease some of his sedating medications prior to increasing his stimulant to the maximal daily dose,. He continues to nap during the day, which could be multifactorial due to poor CPAP control, Sedating medications and his narcolepsy.   Observations/Objective: Down Load 12/17/2018-01/15/2019 Air Sense AutoSet 10-20 cm pressure Usage 30/30 days or 100% > 4 hours 29 days < 4 hours 1 day or 3% Average daily usage 5 hours 56 minutes Median Pressure 15.1 cm  Maximum pressure 19.1 cm AHI is 11.0  Assessment and Plan: OSA Central apnea Good Compliance Poor Control per download>> AHI is 11 Take multiple sedating medications in addition to stimulant medications Currently on 300 mg of Modafinil  ( hepatic metabolism>> last hepatic function panel 2014) Multifactorial causes of daytime somnulence Plan: Needs CPAP Titration ( Add in text area, may need BiPAP pt has poor control on CPAP) For now continue 300 mg of Modafinil I will discuss increase of modafinil to 400 mg daily with Dr. Ander Slade Continue on CPAP at bedtime. You appear to be benefiting from the treatment  Goal is to wear for at least 6 hours each night for maximal clinical benefit. Continue to work on weight loss, as the link between excess weight  and sleep apnea is well established.   Remember to establish a good bedtime routine, and work on sleep hygiene.  Limit daytime naps , avoid stimulants such as caffeine and nicotine close to bedtime, exercise daily to promote sleep quality, avoid heavy , spicy, fried , or rich foods before bed. Ensure adequate exposure to natural light during the day,establish a relaxing bedtime routine with a pleasant sleep environment ( Bedroom between 60 and 67 degrees, turn off bright lights , TV or device screens screens , consider black out curtains or white noise machines) Do not drive if sleepy. Remember to clean mask, tubing, filter, and reservoir once weekly with soapy water.  Follow up with Dr. Ander Slade  or Judson Roch NP in  In 2   or before as needed with down load.    Consider PFT's and 6 minute walk at follow up as he states he has some shortness of breath with exertion.  Follow Up Instructions: Follow up in 2 months with down Load  I discussed the assessment and treatment plan with the patient. The patient was provided an opportunity to ask questions and all were answered. The patient agreed with the plan and demonstrated an understanding of the instructions.   The patient was advised to call back or seek an in-person evaluation if the symptoms worsen or if the condition fails to improve as anticipated.  I provided 35  minutes of non-face-to-face time during this encounter.   Magdalen Spatz, NP 01/16/2019 3:58 PM

## 2019-01-16 NOTE — Patient Instructions (Addendum)
It was good to speak with you today We will order a CPAP titration study ( Add in text may need BiPAP, pt with poor control on CPAP) Continue modafinil, 200 every morning and 100 every afternoon for now  Encouraged to limit is naps to as minimal number of minutes to hours to help some function well Encouraged to try and get more sleep at night Encouraged to optimize his sleep hygiene I will discuss increase of modafinil with Dr. Ander Slade We will communicate his recommendations to you. Continue on CPAP at bedtime. You appear to be benefiting from the treatment  Goal is to wear for at least 6 hours each night for maximal clinical benefit. Continue to work on weight loss, as the link between excess weight  and sleep apnea is well established.   Remember to establish a good bedtime routine, and work on sleep hygiene.  Limit daytime naps , avoid stimulants such as caffeine and nicotine close to bedtime, exercise daily to promote sleep quality, avoid heavy , spicy, fried , or rich foods before bed. Ensure adequate exposure to natural light during the day,establish a relaxing bedtime routine with a pleasant sleep environment ( Bedroom between 60 and 67 degrees, turn off bright lights , TV or device screens screens , consider black out curtains or white noise machines) Do not drive if sleepy. Remember to clean mask, tubing, filter, and reservoir once weekly with soapy water.  Follow up with Dr. Ander Slade  or Judson Roch NP in  In 2 months   or before as needed with down load.   At Follow up BNP, PFT and CXR as patient states he has some occasional shortness of breath. Please contact office for sooner follow up if symptoms do not improve or worsen or seek emergency care

## 2019-02-02 ENCOUNTER — Telehealth: Payer: Self-pay | Admitting: Pulmonary Disease

## 2019-02-02 DIAGNOSIS — G4733 Obstructive sleep apnea (adult) (pediatric): Secondary | ICD-10-CM

## 2019-02-02 NOTE — Telephone Encounter (Signed)
Spoke with pt,'s wife, she wants to follow up on CPAP titration and medication that SG was supposed to talk to Dr. Ander Slade about increasing the dose on Modanafil. I ordered that CPAP titration because there wasn't an order in the system for this. SG please advise.

## 2019-02-03 NOTE — Telephone Encounter (Signed)
I had said it is okay to increase modafinil We will follow-up after CPAP titration  Issues with compliance

## 2019-02-05 NOTE — Telephone Encounter (Signed)
200 mg twice a day is the maximum dose that can be used  Most people benefit from just 200 mg daily  Other behavioral modifications to make sure he is getting adequate amount of sleep and CPAP use will probably help better than just increasing the modafinil  We can go ahead with CPAP titration if he feels the CPAP is still not working well as long as he has been using it well-re-titration will not fix regular use and adequate number of hours of use

## 2019-02-05 NOTE — Telephone Encounter (Signed)
Called and spoke with pt's wife [name] letting her know the info stated by AO. [name] expressed understanding. Nothing further needed.

## 2019-02-05 NOTE — Telephone Encounter (Signed)
Called and spoke with pt's wife [name] letting her know that AO said it was okay for pt to increase the modafinil. Per [name], pt is currently taking 300mg  of the modafinil.  [name] and pt want to know what what dose increase can be done for the modafinil.  The cpap titration was ordered for pt per Jonelle Sidle.  Dr. Ander Slade, please advise on this for pt and wife [name]. Thanks!

## 2019-02-09 ENCOUNTER — Other Ambulatory Visit (HOSPITAL_COMMUNITY)
Admission: RE | Admit: 2019-02-09 | Discharge: 2019-02-09 | Disposition: A | Discharge status: Home / Self Care | Payer: No Typology Code available for payment source | Source: Ambulatory Visit | Attending: Internal Medicine | Admitting: Internal Medicine

## 2019-02-09 DIAGNOSIS — Z1159 Encounter for screening for other viral diseases: Secondary | ICD-10-CM | POA: Insufficient documentation

## 2019-02-09 LAB — SARS CORONAVIRUS 2 (TAT 6-24 HRS): SARS Coronavirus 2: NEGATIVE

## 2019-02-12 ENCOUNTER — Other Ambulatory Visit: Payer: Self-pay

## 2019-02-12 ENCOUNTER — Ambulatory Visit (HOSPITAL_BASED_OUTPATIENT_CLINIC_OR_DEPARTMENT_OTHER): Payer: No Typology Code available for payment source | Attending: Acute Care | Admitting: Internal Medicine

## 2019-02-12 DIAGNOSIS — G4733 Obstructive sleep apnea (adult) (pediatric): Secondary | ICD-10-CM | POA: Diagnosis not present

## 2019-02-12 DIAGNOSIS — Z9989 Dependence on other enabling machines and devices: Secondary | ICD-10-CM | POA: Insufficient documentation

## 2019-02-19 ENCOUNTER — Telehealth: Payer: Self-pay | Admitting: Pulmonary Disease

## 2019-02-19 DIAGNOSIS — G4733 Obstructive sleep apnea (adult) (pediatric): Secondary | ICD-10-CM

## 2019-02-19 NOTE — Telephone Encounter (Signed)
Called and spoke to patient's wife, Hassan Rowan, who is listed on DPR.  She was calling to follow up on patient's CPAP titration study from 6/29 and wanted to make sure after our physicians interpret results that the report gets sent to the [place] since they manage his supplies. The study still shows in process at this time.  Routing to [place] B who is working with Dr. Jenetta Downer this week to follow back up on this later this week.

## 2019-02-21 NOTE — Telephone Encounter (Signed)
Results still in progress. I'll remind Dr. Ander Slade 02/22/19 to review but looks like it may have been sent to Dr. Annamaria Boots.  Will follow up on it.

## 2019-02-21 NOTE — Telephone Encounter (Signed)
Langley Gauss - please advise if this has been taken care of.  Thanks!

## 2019-02-22 NOTE — Telephone Encounter (Signed)
Spoke with pt's wife, [name]. She is aware of pt's CPAP titration study results. Order has been placed for new CPAP machine and new pressure. Nothing further was needed.

## 2019-02-22 NOTE — Telephone Encounter (Signed)
Sleep study reviewed  Patient had a CPAP titration  Optimal pressure found to be 15 cm of water  Recommendation: Order to DME CPAP of 15 with heated humidification Patient's mask of choice

## 2019-02-22 NOTE — Telephone Encounter (Signed)
Dr. Ander Slade resulted study.  Routed to triage also

## 2019-02-24 DIAGNOSIS — Z9989 Dependence on other enabling machines and devices: Secondary | ICD-10-CM | POA: Diagnosis not present

## 2019-02-24 DIAGNOSIS — G4733 Obstructive sleep apnea (adult) (pediatric): Secondary | ICD-10-CM

## 2019-02-24 NOTE — Procedures (Signed)
Patient Name: [name], D[place] Date: 02/12/2019 Gender: Male D.O.B: Feb 16, 1951 Age (years): 59 Referring Provider: Magdalen Spatz NP Height (inches): 74 Interpreting Physician: Baird Lyons MD, ABSM Weight (lbs): 245 RPSGT: Laren Everts BMI: 32 MRN: [id number] Neck Size: 16.50  CLINICAL INFORMATION The patient is referred for a CPAP titration to treat sleep apnea. Date of NPSG, Split Night or HST:  NPSG 08/27/18   AHI 6.2/ hr, desaturation to 81%, body weight 245 lbs  SLEEP STUDY TECHNIQUE As per the AASM Manual for the Scoring of Sleep and Associated Events v2.3 (April 2016) with a hypopnea requiring 4% desaturations.  The channels recorded and monitored were frontal, central and occipital EEG, electrooculogram (EOG), submentalis EMG (chin), nasal and oral airflow, thoracic and abdominal wall motion, anterior tibialis EMG, snore microphone, electrocardiogram, and pulse oximetry. Continuous positive airway pressure (CPAP) was initiated at the beginning of the study and titrated to treat sleep-disordered breathing.  MEDICATIONS Medications self-administered by patient taken the night of the study : TEMAZEPAM, PAROXETINE, BROMOCRIPTINE, SYNTHROID, METFORMIN, GABAPENTIN, TRIAMTERENE W/HCTZ  TECHNICIAN COMMENTS Comments added by technician: NONE Comments added by scorer: N/A  RESPIRATORY PARAMETERS Optimal PAP Pressure (cm): 15 AHI at Optimal Pressure (/hr): 3.2 Overall Minimal O2 (%): 89.0 Supine % at Optimal Pressure (%): 100 Minimal O2 at Optimal Pressure (%): 93.0   SLEEP ARCHITECTURE The study was initiated at 10:37:40 PM and ended at 4:40:03 AM.  Sleep onset time was 6.3 minutes and the sleep efficiency was 86.3%%. The total sleep time was 312.6 minutes.  The patient spent 14.9%% of the night in stage N1 sleep, 49.7%% in stage N2 sleep, 0.0%% in stage N3 and 35.4% in REM.Stage REM latency was 103.5 minutes  Wake after sleep onset was 43.5. Alpha intrusion was  absent. Supine sleep was 100.00%.  CARDIAC DATA The 2 lead EKG demonstrated sinus rhythm. The mean heart rate was 48.5 beats per minute. Other EKG findings include: None.  LEG MOVEMENT DATA The total Periodic Limb Movements of Sleep (PLMS) were 0. The PLMS index was 0.0. A PLMS index of <15 is considered normal in adults.  IMPRESSIONS - The optimal PAP pressure was 15 cm of water. - BIPAP was attempted for 38 minutes at 19/15 between 3:50 and 4:30 AM, then returned to CPAP 15 when patient was unable to return to sleep. - Central sleep apnea was not noted during this titration (CAI = 1.0/h). - Mild oxygen desaturations were observed during this titration (min O2 = 89.0%). Minimum sat at CPAP 15 was 93%. - The patient snored with moderate snoring volume during this titration study. - No cardiac abnormalities were observed during this study. - Clinically significant periodic limb movements were not noted during this study. Arousals associated with PLMs were rare.  DIAGNOSIS - Obstructive Sleep Apnea (327.23 [G47.33 ICD-10])  RECOMMENDATIONS - Trial of CPAP therapy on 15 cm H2O or autopap. - Patient uised a Medium size Resmed Full Face Mask AirFit F20 mask and heated humidification. - Be careful with alcohol, sedatives and other CNS depressants that may worsen sleep apnea and disrupt normal sleep architecture. - Sleep hygiene should be reviewed to assess factors that may improve sleep quality. - Weight management and regular exercise should be initiated or continued.  [Electronically signed] 02/24/2019 11:44 AM  Baird Lyons MD, [place], American Board of Sleep Medicine   NPI: [id number]  [place], Tax adviser of Sleep Medicine  ELECTRONICALLY SIGNED ON:  02/24/2019, 11:39 AM [place] PH: [phone]   FX: [phone] [place]

## 2019-02-26 ENCOUNTER — Telehealth: Payer: Self-pay | Admitting: Acute Care

## 2019-02-26 NOTE — Telephone Encounter (Signed)
Per encounter from 7/9, pt ws given results of cpap titration by Ria Comment and order was placed for CPAP. Nothing further needed.

## 2019-02-26 NOTE — Telephone Encounter (Signed)
Please order a CPAP machine if the patient does not already have one.  Trial of CPAP therapy on 15 cm H2O or autopap. Thanks so much  DIAGNOSIS - Obstructive Sleep Apnea (327.23 [G47.33 ICD-10])  RECOMMENDATIONS - Trial of CPAP therapy on 15 cm H2O or autopap. - Patient uised a Medium size Resmed Full Face Mask AirFit F20 mask and heated humidification. - Be careful with alcohol, sedatives and other CNS depressants that may worsen sleep apnea and disrupt normal sleep architecture. - Sleep hygiene should be reviewed to assess factors that may improve sleep quality. - Weight management and regular exercise should be initiated or continued.

## 2019-03-09 ENCOUNTER — Other Ambulatory Visit (HOSPITAL_COMMUNITY): Payer: Self-pay

## 2019-03-09 DIAGNOSIS — R1310 Dysphagia, unspecified: Secondary | ICD-10-CM

## 2019-03-30 ENCOUNTER — Ambulatory Visit (HOSPITAL_COMMUNITY)
Admission: RE | Admit: 2019-03-30 | Discharge: 2019-03-30 | Disposition: A | Discharge status: Home / Self Care | Payer: Medicare Other | Source: Ambulatory Visit | Attending: Internal Medicine | Admitting: Internal Medicine

## 2019-03-30 ENCOUNTER — Other Ambulatory Visit: Payer: Self-pay

## 2019-03-30 DIAGNOSIS — R1310 Dysphagia, unspecified: Secondary | ICD-10-CM | POA: Diagnosis present

## 2019-04-27 ENCOUNTER — Encounter: Payer: Self-pay | Admitting: Pulmonary Disease

## 2019-04-27 ENCOUNTER — Telehealth: Payer: Self-pay | Admitting: Pulmonary Disease

## 2019-04-27 DIAGNOSIS — G47429 Narcolepsy in conditions classified elsewhere without cataplexy: Secondary | ICD-10-CM

## 2019-04-27 DIAGNOSIS — G4733 Obstructive sleep apnea (adult) (pediatric): Secondary | ICD-10-CM

## 2019-04-27 MED ORDER — MODAFINIL 200 MG PO TABS: ORAL_TABLET | ORAL | 0 refills | Status: DC

## 2019-04-27 NOTE — Telephone Encounter (Signed)
ATC Patient's Wife, [name].  Left message to return call and schedule OV with Dr. Ander Slade. ATC patient's listed number. Left message for Patient to return call and schedule OV with Dr. Ander Slade.

## 2019-04-27 NOTE — Telephone Encounter (Signed)
04/27/2019 1540  I have okayed refill.  I have sent these medications to the pharmacy.  Patient needs to have a sleep appointment with Dr. Ander Slade.  At Dr. Ander Slade next available.  Patient has not been seen since 2019 by Dr. Ander Slade.    Needs appt with Dr. Ander Slade before additional refills.   Wyn Quaker FNP

## 2019-04-27 NOTE — Telephone Encounter (Signed)
Call returned to patient wife [name] (dpr), requesting refill of provigil.   Last filled: 01/16/2019  Last OV: Virtual with SG 01/16/2019  Allergies:  Ace Inhibitors     Sulfa Antibiotics     Sulfamethoxazole      B Mack please advise if willing to refill. Thanks. (order has been pended)

## 2019-05-01 ENCOUNTER — Encounter: Payer: Self-pay | Admitting: Pulmonary Disease

## 2019-05-01 ENCOUNTER — Ambulatory Visit (INDEPENDENT_AMBULATORY_CARE_PROVIDER_SITE_OTHER): Payer: No Typology Code available for payment source | Admitting: Pulmonary Disease

## 2019-05-01 ENCOUNTER — Telehealth: Payer: Self-pay

## 2019-05-01 ENCOUNTER — Other Ambulatory Visit: Payer: Self-pay

## 2019-05-01 VITALS — BP 140/62 | HR 61 | Temp 97.0°F | Ht 71.5 in | Wt 255.4 lb

## 2019-05-01 DIAGNOSIS — Z9989 Dependence on other enabling machines and devices: Secondary | ICD-10-CM | POA: Diagnosis not present

## 2019-05-01 DIAGNOSIS — G4733 Obstructive sleep apnea (adult) (pediatric): Secondary | ICD-10-CM

## 2019-05-01 DIAGNOSIS — G47429 Narcolepsy in conditions classified elsewhere without cataplexy: Secondary | ICD-10-CM

## 2019-05-01 MED ORDER — MODAFINIL 200 MG PO TABS: 300.0000 mg | ORAL_TABLET | Freq: Every day | ORAL | 0 refills | Status: DC

## 2019-05-01 NOTE — Progress Notes (Signed)
[name]    [id number]    10/01/1950  Primary Care Physician:Dean, Carolann Littler, MD  Referring Physician: Rogers Blocker, MD [address] [place],  [place] [address]  Chief complaint:   Patient with a history of narcolepsy, obstructive sleep apnea Compliant with CPAP, compliant with stimulant medications In for follow-up visit today  HPI:  [name] was diagnosed in 2007 following a sleep study and M SLT Diagnosed with narcolepsy Had a sleep study recently-reviewed-optimal pressure of 15  Has been compliant with CPAP use Compliant with stimulant use currently using modafinil 11/2 pills a day  Will usually take an hour to 2 hours of nap time, [name] is unable to function without the naps Spouse notes confusion, forgetfulness without the naps [name] feels [name] functions well with his current schedule  Usually goes to bed about 12- 1 AM , falls asleep about 2 AM-[name] does watch TV prior to falling asleep, sometimes falls asleep without having his CPAP on [name] does take at night sedative Wakes up usually about 6:30 in the morning  [name] has no difficulty sleeping at night [name] has no concerns about his CPAP use  History of PTSD  Exposures: No significant exposures Smoking history: Never smoker   Outpatient Encounter Medications as of 05/01/2019  Medication Sig  . amLODipine (NORVASC) 10 MG tablet   . bromocriptine (PARLODEL) 5 MG capsule Take 5 mg by mouth 3 (three) times daily.    . Cholecalciferol (VITAMIN D3) 1000 UNITS CAPS Take by mouth every morning.    . diclofenac (VOLTAREN) 75 MG EC tablet Take 75 mg by mouth 2 (two) times daily as needed.  . fexofenadine (ALLEGRA) 180 MG tablet Take 180 mg by mouth daily.    . flunisolide (AEROBID) 250 MCG/ACT inhaler Inhale 2 puffs into the lungs 2 (two) times daily.   . flunisolide (NASAREL) 29 MCG/ACT (0.025%) nasal spray 2 sprays by Nasal route 2 (two) times daily. Dose is for each nostril.   Marland Kitchen gabapentin (NEURONTIN) 300 MG capsule Take  300 mg by mouth 3 (three) times daily.  . hydrOXYzine (ATARAX/VISTARIL) 10 MG tablet Take 20 mg by mouth at bedtime.  . insulin regular (HUMULIN R,NOVOLIN R) 100 UNIT/ML injection Inject 10 Units into the skin as needed. Insulin Reg Human Novalin R100 Unit/ML  10 units daily IF blood sugar over 140  . Levothyroxine Sodium (SYNTHROID PO) Take 0.025 mg by mouth daily.    . metFORMIN (GLUCOPHAGE) 500 MG tablet Take 500 mg by mouth 2 (two) times daily.    . modafinil (PROVIGIL) 200 MG tablet Continue modafinil, 200mg  every morning and 100mg  every afternoon  . Omega-3 Fatty Acids (FISH OIL PO) Take 1,000 mg by mouth 3 (three) times daily.   Marland Kitchen PARoxetine (PAXIL) 40 MG tablet Take 40 mg by mouth at bedtime.    . polyvinyl alcohol-povidone (HYPOTEARS) 1.4-0.6 % ophthalmic solution Place 1 drop into both eyes 3 (three) times daily.   . pravastatin (PRAVACHOL) 20 MG tablet Take 20 mg by mouth at bedtime.   . temazepam (RESTORIL) 15 MG capsule Take 30 mg by mouth at bedtime.  . temazepam (RESTORIL) 15 MG capsule Take by mouth.  . triamterene-hydrochlorothiazide (MAXZIDE) 75-50 MG per tablet Take 0.5 tablets by mouth daily.  . valsartan (DIOVAN) 160 MG tablet Take 160 mg by mouth 2 (two) times daily.   . verapamil (CALAN) 120 MG tablet Take 120 mg by mouth daily.    . verapamil (  COVERA HS) 180 MG (CO) 24 hr tablet Take 180 mg by mouth daily.    . vitamin B-12 (CYANOCOBALAMIN) 500 MCG tablet Take 500 mcg by mouth daily.  . vitamin E 400 UNIT capsule Take 400 Units by mouth 2 (two) times daily.     No facility-administered encounter medications on file as of 05/01/2019.     Allergies as of 05/01/2019 - Review Complete 05/01/2019  Allergen Reaction Noted  . Ace inhibitors Swelling 08/27/2011  . Sulfa antibiotics Swelling 08/27/2011  . Sulfamethoxazole Swelling 05/06/2016    Past Medical History:  Diagnosis Date  . Allergic rhinitis   . Anxiety   . Carpal tunnel syndrome on right   . Depression   .  Diabetes mellitus   . DJD (degenerative joint disease)   . Hyperlipidemia   . Hypertension   . Hypothyroidism   . Mental disorder   . Narcolepsy    takes provigil  . Osteoporosis   . 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 Sleep apnea    cpac  . Weakness of left leg     Past Surgical History:  Procedure Laterality Date  . CERVICAL DISC SURGERY     x 2  . HIP ARTHROPLASTY     right  . JOINT REPLACEMENT    . KNEE ARTHROPLASTY     bilateral  . LUMBAR LAMINECTOMY/DECOMPRESSION MICRODISCECTOMY  08/31/2011   Procedure: LUMBAR LAMINECTOMY/DECOMPRESSION MICRODISCECTOMY;  Surgeon: Otilio Connors, MD;  Location: [facility];  Service: Neurosurgery;  Laterality: N/A;  LumbarThree to Lumbar Five Laminectomy, possible Lumbar Four-Five Diskectomy  . SHOULDER SURGERY     left    No family history on file.  Social History   Socioeconomic History  . Marital status: Married    Spouse name: Not on file  . Number of children: Not on file  . Years of education: Not on file  . Highest education level: Not on file  Occupational History  . Not on file  Social Needs  . Financial resource strain: Not on file  . Food insecurity    Worry: Not on file    Inability: Not on file  . Transportation needs    Medical: Not on file    Non-medical: Not on file  Tobacco Use  . Smoking status: Never Smoker  . Smokeless tobacco: Never Used  Substance and Sexual Activity  . Alcohol use: No  . Drug use: No  . Sexual activity: Not on file  Lifestyle  . Physical activity    Days per week: Not on file    Minutes per session: Not on file  . Stress: Not on file  Relationships  . Social Herbalist on phone: Not on file    Gets together: Not on file    Attends religious service: Not on file    Active member of club or organization: Not on file    Attends meetings of clubs or organizations: Not on file    Relationship status: Not on file  . Intimate  partner violence    Fear of current or ex partner: Not on file    Emotionally abused: Not on file    Physically abused: Not on file    Forced sexual activity: Not on file  Other Topics Concern  . Not on file  Social History Narrative  . Not on file    Review of Systems  Constitutional: Negative for activity change and appetite change.  HENT: Negative.   Eyes: Negative.   Respiratory: Positive for apnea. Negative for cough, choking and shortness of breath.   Cardiovascular: Negative.   Endocrine: Negative.   Genitourinary: Negative.   Allergic/Immunologic: Negative.   Neurological: Negative.   Hematological: Negative.     Vitals:   05/01/19 0928  BP: 140/62  Pulse: 61  Temp: (!) 97 F (36.1 C)  SpO2: 99%     Physical Exam  Constitutional: [name] appears well-developed and well-nourished. No distress.  HENT:  Head: Normocephalic and atraumatic.  Eyes: Pupils are equal, round, and reactive to light. Conjunctivae and EOM are normal. Right eye exhibits no discharge. Left eye exhibits no discharge.  Neck: Normal range of motion. Neck supple. No tracheal deviation present. No thyromegaly present.  Cardiovascular: Normal rate and regular rhythm.  Pulmonary/Chest: Effort normal and breath sounds normal. No respiratory distress. [name] has no wheezes.  Abdominal: Bowel sounds are normal.  Neurological: [name] is alert.  Skin: [name] is not diaphoretic.  Psychiatric: His behavior is normal.   Data Reviewed: Sleep study from November 2007 noted Compliance data present did review 87% compliance, residual AHI of 9.6 Auto titrating CPAP pressure setting between 10 and 20 with a median pressure of 13.5  CPAP titration 02/12/2019-optimal pressure of 15-study reviewed  Compliance data reveals 100% compliance with elevated AHI of 28  Assessment:  1.  Obstructive sleep apnea-suboptimally treated at present with elevated AHI on compliance data  2.  Narcolepsy-appears to have a schedule that  works for him at present, will respect to his stimulants and also is scheduled nap  3.  PTSD history  Plan/Recommendations: 1.  Continue modafinil, 1-1/2 pills a day  2.  Encouraged to limit is naps to as minimal number of minutes to hours to help some function well  3.  Try and lose weight as able  4.  Optimize sleep hygiene  We will increase CPAP from 15 to 16 cm of water Obtain a download in about a month  I will see him in the office in about 3 to 4 months  Sherrilyn Rist MD [facility] 05/01/2019, 9:36 AM  CC: Rogers Blocker, MD

## 2019-05-01 NOTE — Patient Instructions (Signed)
Obstructive sleep apnea Doing well  Compliance data shows elevated AHI-still stopping breathing a bit We will increase pressure from 15-16 We will obtain a download from your machine in a month  I will see you back in the office between 3 and 4 months from now Continue with your stimulant 1 and a half pills in the mornings

## 2019-05-01 NOTE — Telephone Encounter (Signed)
Called and spoke to pt's wife. Pt has an appt with our office this morning. Nothing further needed at this time. Will sign off.

## 2019-05-01 NOTE — Telephone Encounter (Signed)
Spoke with wife, she states pt is no longer on insulin and the modanifil directions should be take one pill and a half every morning. I made the necessary changes and nothing further is needed.

## 2019-05-15 ENCOUNTER — Encounter: Payer: Self-pay | Admitting: General Surgery

## 2019-07-05 ENCOUNTER — Ambulatory Visit: Payer: Self-pay | Admitting: General Surgery

## 2019-07-05 NOTE — H&P (Signed)
[name] Documented: 07/05/2019 9:02 AM Location: [facility] Patient #: [id number] DOB: March 29, 1951 Married / Language: [place] / Race: Black or African American Male   History of Present Illness Tim Hiss M. Tim Hopple MD; 07/05/2019 9:51 AM) The patient is a 68 year old male who presents with an inguinal hernia. He is referred by Dr [name] at [facility] urology for evaluation of a left inguinal hernia. He is accompanied by his wife. He states that he has had an intermittent bulge in his left groin for about 2 years now. It never really bothered him until recently. He states that its to come harder for him to urinate. He states that he may have to push on the area to help empty his bladder. He denies any nocturia. He does report some weakness in his straining. He also reports a sensation of incomplete emptying. His primary care doctor placed him on Flomax with no improvement in his symptoms that he was referred to urology. Urology examined the patient fell he had a left inguinal hernia. He denies any burning, shooting or stabbing pain in the groin. He denies any prior abdominal surgery. His primary care doctors at the [place]. He denies any smoking. He denies any chest pain, chest pressure, TIAs or amaurosis fugax. He does not have a history of MI or stroke. He is not on blood thinners. He states that he can't take NSAIDs because of his kidneys   Problem List/Past Medical Tim Hiss M. Tim Pulling, MD; 07/05/2019 10:01 AM) LEFT INGUINAL HERNIA (K40.90)   Past Surgical History [name], CMA; 07/05/2019 9:08 AM) Colon Polyp Removal - Colonoscopy  Hip Surgery  Right. Knee Surgery  Bilateral. Oral Surgery  Shoulder Surgery  Left. Spinal Surgery - Lower Back  Spinal Surgery - Neck  Spinal Surgery Midback   Diagnostic Studies History [name], CMA; 07/05/2019 9:08 AM) Colonoscopy  1-5 years ago  Allergies [name], [place]; 07/05/2019 9:08 AM) [name]    Medication History [name], CMA; 07/05/2019 9:13 AM) Losartan Potassium (100MG  Tablet, Oral) Active. Modafinil (200MG  Tablet, Oral) Active. Temazepam (30MG  Capsule, Oral) Active. hydrALAZINE HCl (10MG  Tablet, Oral) Active. Levothyroxine Sodium (50MCG Tablet, Oral) Active. Castellani Paint Modified (1.5% Liquid, External) Active. Fluticasone Furoate (27.5MCG/SPRAY Suspension, Nasal) Active. PARoxetine HCl (40MG  Tablet, Oral) Active. Medications Reconciled  Social History [name], CMA; 07/05/2019 9:08 AM) Caffeine use  Carbonated beverages, Tea. No alcohol use  No drug use  Tobacco use  Never smoker.  Family History [name], [place]; 07/05/2019 9:08 AM) Colon Cancer  Brother. Depression  Father. Diabetes Mellitus  Mother.  Other Problems Tim Hiss M. Tim Pulling, MD; 07/05/2019 10:01 AM) Anxiety Disorder  Arthritis  Back Pain  Depression  Diabetes Mellitus  Gastroesophageal Reflux Disease  Hypercholesterolemia  Thyroid Disease  Sleep Apnea  High blood pressure     Review of Systems [name] CMA; 07/05/2019 9:08 AM) General Present- Weight Loss. Not Present- Appetite Loss, Chills, Fatigue, Fever, Night Sweats and Weight Gain. Skin Not Present- Change in Wart/Mole, Dryness, Hives, Jaundice, New Lesions, Non-Healing Wounds, Rash and Ulcer. HEENT Present- Seasonal Allergies and Wears glasses/contact lenses. Not Present- Earache, Hearing Loss, Hoarseness, Nose Bleed, Oral Ulcers, Ringing in the Ears, Sinus Pain, Sore Throat, Visual Disturbances and Yellow Eyes. Respiratory Not Present- Bloody sputum, Chronic Cough, Difficulty Breathing, Snoring and Wheezing. Breast Not Present- Breast Mass, Breast Pain, Nipple Discharge and Skin Changes. Cardiovascular Not Present- Chest Pain, Difficulty Breathing Lying Down, Leg Cramps, Palpitations, Rapid Heart Rate, Shortness of Breath and Swelling of  Extremities. Gastrointestinal Present-  Constipation, Difficulty Swallowing, Excessive gas, Gets full quickly at meals and Indigestion. Not Present- Abdominal Pain, Bloating, Bloody Stool, Change in Bowel Habits, Chronic diarrhea, Hemorrhoids, Nausea, Rectal Pain and Vomiting. Male Genitourinary Present- Change in Urinary Stream, Impotence and Urgency. Not Present- Blood in Urine, Frequency, Nocturia, Painful Urination and Urine Leakage. Musculoskeletal Present- Back Pain, Joint Pain, Joint Stiffness and Swelling of Extremities. Not Present- Muscle Pain and Muscle Weakness. Neurological Present- Tingling and Trouble walking. Not Present- Decreased Memory, Fainting, Headaches, Numbness, Seizures, Tremor and Weakness. Psychiatric Present- Anxiety and Depression. Not Present- Bipolar, Change in Sleep Pattern, Fearful and Frequent crying. Endocrine Not Present- Cold Intolerance, Excessive Hunger, Hair Changes, Heat Intolerance, Hot flashes and New Diabetes. Hematology Present- Gland problems. Not Present- Blood Thinners, Easy Bruising, Excessive bleeding, HIV and Persistent Infections.  Vitals [name] CMA; 07/05/2019 9:14 AM) 07/05/2019 9:13 AM Weight: 247.8 lb Height: 73in Body Surface Area: 2.36 m Body Mass Index: 32.69 kg/m  Temp.: 74F(Oral)  Pulse: 97 (Regular)  BP: 136/84 (Sitting, Left Arm, Standard)       Physical Exam Tim Hiss M. Braheem Tomasik MD; 07/05/2019 9:49 AM) General Mental Status-Alert. General Appearance-Consistent with stated age. Hydration-Well hydrated. Voice-Normal.  Head and Neck Head-normocephalic, atraumatic with no lesions or palpable masses. Trachea-midline. Thyroid Gland Characteristics - normal size and consistency.  Eye Eyeball - Bilateral-Extraocular movements intact. Sclera/Conjunctiva - Bilateral-No scleral icterus.  ENMT Ears Pinna - Bilateral - no bony growth in lateral aspect of ear canal, no edema. Nose and Sinuses External Inspection of the Nose -  symmetric, no deformities observed.  Chest and Lung Exam Chest and lung exam reveals -quiet, even and easy respiratory effort with no use of accessory muscles and on auscultation, normal breath sounds, no adventitious sounds and normal vocal resonance. Inspection Chest Wall - Normal. Back - normal.  Breast - Did not examine.  Cardiovascular Cardiovascular examination reveals -normal heart sounds, regular rate and rhythm with no murmurs and normal pedal pulses bilaterally.  Abdomen Inspection Inspection of the abdomen reveals - No Hernias. Skin - Scar - no surgical scars. Palpation/Percussion Palpation and Percussion of the abdomen reveal - Soft, Non Tender, No Rebound tenderness, No Rigidity (guarding) and No hepatosplenomegaly. Auscultation Auscultation of the abdomen reveals - Bowel sounds normal.  Male Genitourinary Note: Patient examined supine and standing with and without Valsalva maneuvers. Obvious bulge extending into the inguinal scrotal area left side. Reducible. No bulge in right inguinal canal with Valsalva   Peripheral Vascular Upper Extremity Palpation - Pulses bilaterally normal.  Neurologic Neurologic evaluation reveals -alert and oriented x 3 with no impairment of recent or remote memory. Mental Status-Normal.  Neuropsychiatric The patient's mood and affect are described as -normal. Judgment and Insight-insight is appropriate concerning matters relevant to self.  Musculoskeletal Normal Exam - Left-Upper Extremity Strength Normal and Lower Extremity Strength Normal. Normal Exam - Right-Upper Extremity Strength Normal and Lower Extremity Strength Normal.  Lymphatic Head & Neck  General Head & Neck Lymphatics: Bilateral - Description - Normal. Axillary - Did not examine. Femoral & Inguinal - Did not examine.    Assessment & Plan Tim Hiss M. Danney Bungert MD; 07/05/2019 10:01 AM) LEFT INGUINAL HERNIA (K40.90) Impression: We discussed the etiology  of inguinal hernias. We discussed the signs & symptoms of incarceration & strangulation. We discussed non-operative and operative management. I recommended a laparoscopic approach. I reviewed his outside CT and he does in fact have a portion of his bladder herniated into his inguinal canal.  The patient  has elected to proceed with laparoscopic repair of left inguinal hernia with mesh   I described the procedure in detail. The patient was given educational material. We discussed the risks and benefits including but not limited to bleeding, infection, chronic inguinal pain, nerve entrapment, hernia recurrence, mesh complications, hematoma formation, urinary retention, injury to the testicle, numbness in the groin, blood clots, injury to the surrounding structures, and anesthesia risk. We also discussed the typical post operative recovery course, including no heavy lifting for 4-6 weeks. I explained that the likelihood of improvement of their symptoms is good  We discussed that hernia repair May not ameliorate all his urinary complaints. He would like to wait until January to have surgery which I think is reasonable. Current Plans Pt Education - Pamphlet Given - Laparoscopic Hernia Repair: discussed with patient and provided information. You are being scheduled for surgery- Our schedulers will call you.  You should hear from our office's scheduling department within 5 working days about the location, date, and time of surgery. We try to make accommodations for patient's preferences in scheduling surgery, but sometimes the OR schedule or the surgeon's schedule prevents [place] from making those accommodations.  If you have not heard from our office [phone]) in 5 working days, call the office and ask for your surgeon's nurse.  If you have other questions about your diagnosis, plan, or surgery, call the office and ask for your surgeon's nurse.  HYPERTENSION, ESSENTIAL (I10) OBSTRUCTIVE SLEEP APNEA  (G47.33)  Addendum Note(Clennon Nasca M. Elenore Wanninger MD; 07/05/2019 10:04 AM) L urinary bladder hernia   Leighton Ruff. Tim Pulling, MD, FACS General, Bariatric, & Minimally Invasive Surgery [facility], [place]

## 2019-07-10 ENCOUNTER — Ambulatory Visit (INDEPENDENT_AMBULATORY_CARE_PROVIDER_SITE_OTHER): Payer: Medicare Other | Admitting: Pulmonary Disease

## 2019-07-10 ENCOUNTER — Other Ambulatory Visit: Payer: Self-pay

## 2019-07-10 ENCOUNTER — Encounter: Payer: Self-pay | Admitting: Pulmonary Disease

## 2019-07-10 DIAGNOSIS — G47429 Narcolepsy in conditions classified elsewhere without cataplexy: Secondary | ICD-10-CM | POA: Diagnosis not present

## 2019-07-10 DIAGNOSIS — Z9989 Dependence on other enabling machines and devices: Secondary | ICD-10-CM | POA: Diagnosis not present

## 2019-07-10 DIAGNOSIS — G4733 Obstructive sleep apnea (adult) (pediatric): Secondary | ICD-10-CM

## 2019-07-10 DIAGNOSIS — F4310 Post-traumatic stress disorder, unspecified: Secondary | ICD-10-CM

## 2019-07-10 NOTE — Progress Notes (Signed)
Virtual Visit via Telephone Note  I connected with Emerald Cunnane Hutchins on 07/10/19 at 11:45 AM EST by telephone and verified that I am speaking with the correct person using two identifiers.  Location: Patient: [facility] Provider: Adrian Prince discussed the limitations, risks, security and privacy concerns of performing an evaluation and management service by telephone and the availability of in person appointments. I also discussed with the patient that there may be a patient responsible charge related to this service. The patient expressed understanding and agreed to proceed.   History of Present Illness: Patient being followed up for obstructive sleep apnea  Offers no complaints today  CPAP seems to be working well Tolerating pressures well Feels he wakes up feeling like he is at a decent nights rest   Observations/Objective: Sounds well on the phone  Assessment and Plan: Obstructive sleep apnea, adequately treated with CPAP therapy Compliance data reviewed showing 100% compliance, machine is set at 15 He does have a current residual AHI of 13(though this number is elevated much more than when we want it to be) it describes excellent compliance and adequate improvement in his symptoms  We will leave current setting  He will be having hernia surgery and cataract surgery in the next few months Encouraged continuing compliance with CPAP  Follow Up Instructions:    I discussed the assessment and treatment plan with the patient. The patient was provided an opportunity to ask questions and all were answered. The patient agreed with the plan and demonstrated an understanding of the instructions.   The patient was advised to call back or seek an in-person evaluation if the symptoms worsen or if the condition fails to improve as anticipated.  I provided 14 minutes of non-face-to-face time during this encounter.  Continue CPAP use Call with any concerns We will follow-up in 3  months  Adewale Clearence Ped, MD

## 2019-07-10 NOTE — Patient Instructions (Signed)
Encourage continuing CPAP use at current pressure settings  Call with any concerns  I will see you in 3 months

## 2019-08-31 ENCOUNTER — Other Ambulatory Visit: Payer: Self-pay

## 2019-08-31 ENCOUNTER — Encounter (HOSPITAL_BASED_OUTPATIENT_CLINIC_OR_DEPARTMENT_OTHER): Payer: Self-pay | Admitting: General Surgery

## 2019-08-31 NOTE — Progress Notes (Signed)
Spoke with: [name] (wife) NPO:  After Midnight, no gum, candy, or mints   Arrival time:  0930 AM Labs: CBC/diff, CMP, EKG AM medications: Amlodipine, Bromocriptine, Fexofenadine, Gabapentin, Levothyroxine, Tamsulosin, Modafinil, Eyedrops, Nasal spray Pre op orders: Yes Ride home: [name] (wife) [phone] Asked to bring CPAP machine day of surgery

## 2019-09-03 ENCOUNTER — Other Ambulatory Visit (HOSPITAL_COMMUNITY)
Admission: RE | Admit: 2019-09-03 | Discharge: 2019-09-03 | Disposition: A | Discharge status: Home / Self Care | Payer: No Typology Code available for payment source | Source: Ambulatory Visit | Attending: General Surgery | Admitting: General Surgery

## 2019-09-03 ENCOUNTER — Encounter (HOSPITAL_COMMUNITY)
Admission: RE | Admit: 2019-09-03 | Discharge: 2019-09-03 | Disposition: A | Discharge status: Home / Self Care | Payer: No Typology Code available for payment source | Source: Ambulatory Visit | Attending: General Surgery | Admitting: General Surgery

## 2019-09-03 ENCOUNTER — Other Ambulatory Visit: Payer: Self-pay

## 2019-09-03 DIAGNOSIS — Z01818 Encounter for other preprocedural examination: Secondary | ICD-10-CM | POA: Diagnosis not present

## 2019-09-03 DIAGNOSIS — Z20822 Contact with and (suspected) exposure to covid-19: Secondary | ICD-10-CM | POA: Diagnosis not present

## 2019-09-03 DIAGNOSIS — R001 Bradycardia, unspecified: Secondary | ICD-10-CM | POA: Diagnosis not present

## 2019-09-03 LAB — COMPREHENSIVE METABOLIC PANEL
ALT: 26 U/L (ref 0–44)
AST: 29 U/L (ref 15–41)
Albumin: 4.2 g/dL (ref 3.5–5.0)
Alkaline Phosphatase: 72 U/L (ref 38–126)
Anion gap: 10 (ref 5–15)
BUN: 23 mg/dL (ref 8–23)
CO2: 26 mmol/L (ref 22–32)
Calcium: 9.6 mg/dL (ref 8.9–10.3)
Chloride: 103 mmol/L (ref 98–111)
Creatinine, Ser: 1.4 mg/dL — ABNORMAL HIGH (ref 0.61–1.24)
GFR calc Af Amer: 59 mL/min — ABNORMAL LOW (ref >60)
GFR calc non Af Amer: 51 mL/min — ABNORMAL LOW (ref >60)
Glucose, Bld: 113 mg/dL — ABNORMAL HIGH (ref 70–99)
Potassium: 4.4 mmol/L (ref 3.5–5.1)
Sodium: 139 mmol/L (ref 135–145)
Total Bilirubin: 0.4 mg/dL (ref 0.3–1.2)
Total Protein: 7.4 g/dL (ref 6.5–8.1)

## 2019-09-03 LAB — CBC WITH DIFFERENTIAL/PLATELET
Abs Immature Granulocytes: 0.02 10*3/uL (ref 0.00–0.07)
Basophils Absolute: 0.1 10*3/uL (ref 0.0–0.1)
Basophils Relative: 1 %
Eosinophils Absolute: 0.2 10*3/uL (ref 0.0–0.5)
Eosinophils Relative: 4 %
HCT: 40.8 % (ref 39.0–52.0)
Hemoglobin: 12.3 g/dL — ABNORMAL LOW (ref 13.0–17.0)
Immature Granulocytes: 0 %
Lymphocytes Relative: 32 %
Lymphs Abs: 1.7 10*3/uL (ref 0.7–4.0)
MCH: 23.3 pg — ABNORMAL LOW (ref 26.0–34.0)
MCHC: 30.1 g/dL (ref 30.0–36.0)
MCV: 77.3 fL — ABNORMAL LOW (ref 80.0–100.0)
Monocytes Absolute: 0.6 10*3/uL (ref 0.1–1.0)
Monocytes Relative: 11 %
Neutro Abs: 2.8 10*3/uL (ref 1.7–7.7)
Neutrophils Relative %: 52 %
Platelets: 238 10*3/uL (ref 150–400)
RBC: 5.28 MIL/uL (ref 4.22–5.81)
RDW: 14.6 % (ref 11.5–15.5)
WBC: 5.3 10*3/uL (ref 4.0–10.5)
nRBC: 0 % (ref 0.0–0.2)

## 2019-09-04 LAB — NOVEL CORONAVIRUS, NAA (HOSP ORDER, SEND-OUT TO REF LAB; TAT 18-24 HRS): SARS-CoV-2, NAA: NOT DETECTED

## 2019-09-06 ENCOUNTER — Encounter (HOSPITAL_BASED_OUTPATIENT_CLINIC_OR_DEPARTMENT_OTHER): Payer: Self-pay | Admitting: General Surgery

## 2019-09-06 ENCOUNTER — Encounter (HOSPITAL_BASED_OUTPATIENT_CLINIC_OR_DEPARTMENT_OTHER)
Admission: RE | Disposition: A | Discharge status: Home / Self Care | Payer: Non-veteran care | Source: Other Acute Inpatient Hospital | Attending: General Surgery

## 2019-09-06 ENCOUNTER — Ambulatory Visit (HOSPITAL_BASED_OUTPATIENT_CLINIC_OR_DEPARTMENT_OTHER): Payer: No Typology Code available for payment source | Admitting: Anesthesiology

## 2019-09-06 ENCOUNTER — Ambulatory Visit (HOSPITAL_BASED_OUTPATIENT_CLINIC_OR_DEPARTMENT_OTHER)
Admission: RE | Admit: 2019-09-06 | Discharge: 2019-09-06 | Disposition: A | Discharge status: Home / Self Care | Payer: No Typology Code available for payment source | Source: Other Acute Inpatient Hospital | Attending: General Surgery | Admitting: General Surgery

## 2019-09-06 DIAGNOSIS — G47419 Narcolepsy without cataplexy: Secondary | ICD-10-CM | POA: Diagnosis not present

## 2019-09-06 DIAGNOSIS — K219 Gastro-esophageal reflux disease without esophagitis: Secondary | ICD-10-CM | POA: Insufficient documentation

## 2019-09-06 DIAGNOSIS — I2510 Atherosclerotic heart disease of native coronary artery without angina pectoris: Secondary | ICD-10-CM | POA: Insufficient documentation

## 2019-09-06 DIAGNOSIS — M1990 Unspecified osteoarthritis, unspecified site: Secondary | ICD-10-CM | POA: Insufficient documentation

## 2019-09-06 DIAGNOSIS — E785 Hyperlipidemia, unspecified: Secondary | ICD-10-CM | POA: Insufficient documentation

## 2019-09-06 DIAGNOSIS — M810 Age-related osteoporosis without current pathological fracture: Secondary | ICD-10-CM | POA: Diagnosis not present

## 2019-09-06 DIAGNOSIS — F419 Anxiety disorder, unspecified: Secondary | ICD-10-CM | POA: Diagnosis not present

## 2019-09-06 DIAGNOSIS — F4310 Post-traumatic stress disorder, unspecified: Secondary | ICD-10-CM | POA: Insufficient documentation

## 2019-09-06 DIAGNOSIS — K4090 Unilateral inguinal hernia, without obstruction or gangrene, not specified as recurrent: Secondary | ICD-10-CM | POA: Diagnosis present

## 2019-09-06 DIAGNOSIS — Z79890 Hormone replacement therapy (postmenopausal): Secondary | ICD-10-CM | POA: Insufficient documentation

## 2019-09-06 DIAGNOSIS — I10 Essential (primary) hypertension: Secondary | ICD-10-CM | POA: Insufficient documentation

## 2019-09-06 DIAGNOSIS — F329 Major depressive disorder, single episode, unspecified: Secondary | ICD-10-CM | POA: Insufficient documentation

## 2019-09-06 DIAGNOSIS — E119 Type 2 diabetes mellitus without complications: Secondary | ICD-10-CM | POA: Insufficient documentation

## 2019-09-06 DIAGNOSIS — E669 Obesity, unspecified: Secondary | ICD-10-CM | POA: Diagnosis not present

## 2019-09-06 DIAGNOSIS — E039 Hypothyroidism, unspecified: Secondary | ICD-10-CM | POA: Insufficient documentation

## 2019-09-06 DIAGNOSIS — Z6832 Body mass index (BMI) 32.0-32.9, adult: Secondary | ICD-10-CM | POA: Insufficient documentation

## 2019-09-06 DIAGNOSIS — Z7984 Long term (current) use of oral hypoglycemic drugs: Secondary | ICD-10-CM | POA: Insufficient documentation

## 2019-09-06 DIAGNOSIS — Z79899 Other long term (current) drug therapy: Secondary | ICD-10-CM | POA: Diagnosis not present

## 2019-09-06 DIAGNOSIS — G4733 Obstructive sleep apnea (adult) (pediatric): Secondary | ICD-10-CM | POA: Diagnosis not present

## 2019-09-06 DIAGNOSIS — E1142 Type 2 diabetes mellitus with diabetic polyneuropathy: Secondary | ICD-10-CM | POA: Diagnosis not present

## 2019-09-06 HISTORY — DX: Dysphagia, unspecified: R13.10

## 2019-09-06 HISTORY — DX: Unilateral inguinal hernia, without obstruction or gangrene, not specified as recurrent: K40.90

## 2019-09-06 HISTORY — DX: Atherosclerotic heart disease of native coronary artery without angina pectoris: I25.10

## 2019-09-06 HISTORY — DX: Gastro-esophageal reflux disease without esophagitis: K21.9

## 2019-09-06 HISTORY — PX: INGUINAL HERNIA REPAIR: SHX194

## 2019-09-06 HISTORY — DX: Post-traumatic stress disorder, unspecified: F43.10

## 2019-09-06 HISTORY — DX: Obstructive sleep apnea (adult) (pediatric): G47.33

## 2019-09-06 HISTORY — DX: Polyneuropathy, unspecified: G62.9

## 2019-09-06 HISTORY — DX: Presence of spectacles and contact lenses: Z97.3

## 2019-09-06 LAB — GLUCOSE, CAPILLARY
Glucose-Capillary: 105 mg/dL — ABNORMAL HIGH (ref 70–99)
Glucose-Capillary: 134 mg/dL — ABNORMAL HIGH (ref 70–99)

## 2019-09-06 SURGERY — REPAIR, HERNIA, INGUINAL, LAPAROSCOPIC
Anesthesia: General | Site: Abdomen | Laterality: Left

## 2019-09-06 MED ORDER — ONDANSETRON HCL 4 MG/2ML IJ SOLN
INTRAMUSCULAR | Status: AC
  Filled 2019-09-06: qty 2

## 2019-09-06 MED ORDER — BUPIVACAINE HCL (PF) 0.25 % IJ SOLN
INTRAMUSCULAR | Status: AC
  Filled 2019-09-06: qty 30

## 2019-09-06 MED ORDER — PROPOFOL 10 MG/ML IV BOLUS
INTRAVENOUS | Status: AC
  Filled 2019-09-06: qty 20

## 2019-09-06 MED ORDER — SUGAMMADEX SODIUM 200 MG/2ML IV SOLN
INTRAVENOUS | Status: DC | PRN
  Administered 2019-09-06: 200 mg via INTRAVENOUS

## 2019-09-06 MED ORDER — DEXAMETHASONE SODIUM PHOSPHATE 4 MG/ML IJ SOLN
4.0000 mg | INTRAMUSCULAR | Status: DC
  Filled 2019-09-06: qty 1

## 2019-09-06 MED ORDER — ACETAMINOPHEN 500 MG PO TABS
1000.0000 mg | ORAL_TABLET | ORAL | Status: AC
  Administered 2019-09-06: 1000 mg via ORAL
  Filled 2019-09-06: qty 2

## 2019-09-06 MED ORDER — OXYCODONE HCL 5 MG PO TABS: 5.0000 mg | ORAL_TABLET | Freq: Four times a day (QID) | ORAL | 0 refills | Status: DC | PRN

## 2019-09-06 MED ORDER — FENTANYL CITRATE (PF) 250 MCG/5ML IJ SOLN
INTRAMUSCULAR | Status: AC
  Filled 2019-09-06: qty 5

## 2019-09-06 MED ORDER — DEXAMETHASONE SODIUM PHOSPHATE 10 MG/ML IJ SOLN
INTRAMUSCULAR | Status: AC
  Filled 2019-09-06: qty 1

## 2019-09-06 MED ORDER — CEFAZOLIN SODIUM-DEXTROSE 2-4 GM/100ML-% IV SOLN
INTRAVENOUS | Status: AC
  Filled 2019-09-06: qty 100

## 2019-09-06 MED ORDER — EPINEPHRINE PF 1 MG/ML IJ SOLN
INTRAMUSCULAR | Status: AC
  Filled 2019-09-06: qty 1

## 2019-09-06 MED ORDER — BUPIVACAINE-EPINEPHRINE 0.25% -1:200000 IJ SOLN
INTRAMUSCULAR | Status: DC | PRN
  Administered 2019-09-06: 60 mL

## 2019-09-06 MED ORDER — EPHEDRINE SULFATE 50 MG/ML IJ SOLN
INTRAMUSCULAR | Status: DC | PRN
  Administered 2019-09-06 (×2): 10 mg via INTRAVENOUS

## 2019-09-06 MED ORDER — GABAPENTIN 100 MG PO CAPS
200.0000 mg | ORAL_CAPSULE | ORAL | Status: AC
  Administered 2019-09-06: 200 mg via ORAL
  Filled 2019-09-06: qty 2

## 2019-09-06 MED ORDER — FENTANYL CITRATE (PF) 100 MCG/2ML IJ SOLN
25.0000 ug | INTRAMUSCULAR | Status: DC | PRN
  Filled 2019-09-06: qty 1

## 2019-09-06 MED ORDER — OXYCODONE HCL 5 MG PO TABS
5.0000 mg | ORAL_TABLET | Freq: Once | ORAL | Status: DC | PRN
  Filled 2019-09-06: qty 1

## 2019-09-06 MED ORDER — KETOROLAC TROMETHAMINE 15 MG/ML IJ SOLN
INTRAMUSCULAR | Status: DC | PRN
  Administered 2019-09-06: 15 mg via INTRAVENOUS

## 2019-09-06 MED ORDER — OXYCODONE HCL 5 MG/5ML PO SOLN
5.0000 mg | Freq: Once | ORAL | Status: DC | PRN
  Filled 2019-09-06: qty 5

## 2019-09-06 MED ORDER — ROCURONIUM BROMIDE 10 MG/ML (PF) SYRINGE
PREFILLED_SYRINGE | INTRAVENOUS | Status: DC | PRN
  Administered 2019-09-06: 70 mg via INTRAVENOUS
  Administered 2019-09-06: 10 mg via INTRAVENOUS

## 2019-09-06 MED ORDER — FENTANYL CITRATE (PF) 100 MCG/2ML IJ SOLN
INTRAMUSCULAR | Status: DC | PRN
  Administered 2019-09-06 (×3): 50 ug via INTRAVENOUS

## 2019-09-06 MED ORDER — MIDAZOLAM HCL 2 MG/2ML IJ SOLN
INTRAMUSCULAR | Status: AC
  Filled 2019-09-06: qty 2

## 2019-09-06 MED ORDER — ACETAMINOPHEN 500 MG PO TABS
ORAL_TABLET | ORAL | Status: AC
  Filled 2019-09-06: qty 2

## 2019-09-06 MED ORDER — MIDAZOLAM HCL 2 MG/2ML IJ SOLN
INTRAMUSCULAR | Status: DC | PRN
  Administered 2019-09-06: 2 mg via INTRAVENOUS

## 2019-09-06 MED ORDER — LIDOCAINE 2% (20 MG/ML) 5 ML SYRINGE
INTRAMUSCULAR | Status: AC
  Filled 2019-09-06: qty 5

## 2019-09-06 MED ORDER — ACETAMINOPHEN 500 MG PO TABS: 1000.0000 mg | ORAL_TABLET | Freq: Three times a day (TID) | ORAL | 0 refills | Status: AC

## 2019-09-06 MED ORDER — ROCURONIUM BROMIDE 10 MG/ML (PF) SYRINGE
PREFILLED_SYRINGE | INTRAVENOUS | Status: AC
  Filled 2019-09-06: qty 10

## 2019-09-06 MED ORDER — PROMETHAZINE HCL 25 MG/ML IJ SOLN
6.2500 mg | INTRAMUSCULAR | Status: DC | PRN
  Filled 2019-09-06: qty 1

## 2019-09-06 MED ORDER — LACTATED RINGERS IV SOLN: INTRAVENOUS | Status: DC | PRN

## 2019-09-06 MED ORDER — KETOROLAC TROMETHAMINE 30 MG/ML IJ SOLN
INTRAMUSCULAR | Status: AC
  Filled 2019-09-06: qty 1

## 2019-09-06 MED ORDER — GABAPENTIN 100 MG PO CAPS
ORAL_CAPSULE | ORAL | Status: AC
  Filled 2019-09-06: qty 2

## 2019-09-06 MED ORDER — CEFAZOLIN SODIUM-DEXTROSE 2-4 GM/100ML-% IV SOLN
2.0000 g | INTRAVENOUS | Status: AC
  Administered 2019-09-06: 2 g via INTRAVENOUS
  Filled 2019-09-06: qty 100

## 2019-09-06 MED ORDER — KETOROLAC TROMETHAMINE 30 MG/ML IJ SOLN
30.0000 mg | Freq: Once | INTRAMUSCULAR | Status: DC | PRN
  Filled 2019-09-06: qty 1

## 2019-09-06 MED ORDER — PROPOFOL 10 MG/ML IV BOLUS
INTRAVENOUS | Status: DC | PRN
  Administered 2019-09-06: 150 mg via INTRAVENOUS

## 2019-09-06 MED ORDER — LACTATED RINGERS IV SOLN
INTRAVENOUS | Status: DC
  Filled 2019-09-06: qty 1000

## 2019-09-06 MED ORDER — CHLORHEXIDINE GLUCONATE CLOTH 2 % EX PADS
6.0000 | MEDICATED_PAD | Freq: Once | CUTANEOUS | Status: DC
  Filled 2019-09-06: qty 6

## 2019-09-06 MED ORDER — ONDANSETRON HCL 4 MG/2ML IJ SOLN
INTRAMUSCULAR | Status: DC | PRN
  Administered 2019-09-06: 4 mg via INTRAVENOUS

## 2019-09-06 MED ORDER — DEXAMETHASONE SODIUM PHOSPHATE 10 MG/ML IJ SOLN
INTRAMUSCULAR | Status: DC | PRN
  Administered 2019-09-06: 10 mg via INTRAVENOUS

## 2019-09-06 MED ORDER — EPHEDRINE 5 MG/ML INJ
INTRAVENOUS | Status: AC
  Filled 2019-09-06: qty 10

## 2019-09-06 MED ORDER — LIDOCAINE 2% (20 MG/ML) 5 ML SYRINGE
INTRAMUSCULAR | Status: DC | PRN
  Administered 2019-09-06: 100 mg via INTRAVENOUS

## 2019-09-06 MED ORDER — 0.9 % SODIUM CHLORIDE (POUR BTL) OPTIME
TOPICAL | Status: DC | PRN
  Administered 2019-09-06: 11:00:00 1000 mL

## 2019-09-06 SURGICAL SUPPLY — 43 items
ADH SKN CLS APL DERMABOND .7 (GAUZE/BANDAGES/DRESSINGS) ×1
APL PRP STRL LF DISP 70% ISPRP (MISCELLANEOUS) ×1
APL SKNCLS STERI-STRIP NONHPOA (GAUZE/BANDAGES/DRESSINGS)
APPLIER CLIP 5 13 M/L LIGAMAX5 (MISCELLANEOUS)
APR CLP MED LRG 5 ANG JAW (MISCELLANEOUS)
BANDAGE ADH SHEER 1  50/CT (GAUZE/BANDAGES/DRESSINGS) IMPLANT
BENZOIN TINCTURE PRP APPL 2/3 (GAUZE/BANDAGES/DRESSINGS) ×1 IMPLANT
CABLE HIGH FREQUENCY MONO STRZ (ELECTRODE) ×3 IMPLANT
CHLORAPREP W/TINT 26 (MISCELLANEOUS) ×3 IMPLANT
CLIP APPLIE 5 13 M/L LIGAMAX5 (MISCELLANEOUS) IMPLANT
CLOSURE WOUND 1/2 X4 (GAUZE/BANDAGES/DRESSINGS)
COVER SURGICAL LIGHT HANDLE (MISCELLANEOUS) ×3 IMPLANT
COVER WAND RF STERILE (DRAPES) IMPLANT
DECANTER SPIKE VIAL GLASS SM (MISCELLANEOUS) ×3 IMPLANT
DERMABOND ADVANCED (GAUZE/BANDAGES/DRESSINGS) ×2
DERMABOND ADVANCED .7 DNX12 (GAUZE/BANDAGES/DRESSINGS) ×1 IMPLANT
DEVICE SECURE STRAP 25 ABSORB (INSTRUMENTS) ×2 IMPLANT
DRAPE C-ARM 42X120 X-RAY (DRAPES) ×1 IMPLANT
DRSG COVADERM PLUS 2X2 (GAUZE/BANDAGES/DRESSINGS) IMPLANT
DRSG TEGADERM 2-3/8X2-3/4 SM (GAUZE/BANDAGES/DRESSINGS) IMPLANT
DRSG TEGADERM 4X4.75 (GAUZE/BANDAGES/DRESSINGS) IMPLANT
ELECT PENCIL ROCKER SW 15FT (MISCELLANEOUS) ×2 IMPLANT
GAUZE SPONGE 2X2 8PLY STRL LF (GAUZE/BANDAGES/DRESSINGS) IMPLANT
GLOVE BIO SURGEON STRL SZ7.5 (GLOVE) ×3 IMPLANT
GLOVE INDICATOR 8.0 STRL GRN (GLOVE) ×3 IMPLANT
GOWN STRL REUS W/TWL XL LVL3 (GOWN DISPOSABLE) ×9 IMPLANT
L-HOOK LAP DISP 36CM (ELECTROSURGICAL) ×3
LHOOK LAP DISP 36CM (ELECTROSURGICAL) IMPLANT
MESH 3DMAX 4X6 LT LRG (Mesh General) ×3 IMPLANT
PACK BASIN DAY SURGERY FS (CUSTOM PROCEDURE TRAY) ×3 IMPLANT
SCISSORS LAP 5X35 DISP (ENDOMECHANICALS) ×3 IMPLANT
SET IRRIG TUBING LAPAROSCOPIC (IRRIGATION / IRRIGATOR) IMPLANT
SET TUBE SMOKE EVAC HIGH FLOW (TUBING) ×3 IMPLANT
SLEEVE XCEL OPT CAN 5 100 (ENDOMECHANICALS) ×3 IMPLANT
SPONGE GAUZE 2X2 STER 10/PKG (GAUZE/BANDAGES/DRESSINGS)
STRIP CLOSURE SKIN 1/2X4 (GAUZE/BANDAGES/DRESSINGS) ×1 IMPLANT
SUT MNCRL AB 4-0 PS2 18 (SUTURE) ×3 IMPLANT
SUT NOVA NAB DX-16 0-1 5-0 T12 (SUTURE) IMPLANT
SUT NOVA NAB GS-21 0 18 T12 DT (SUTURE) IMPLANT
TOWEL OR 17X26 10 PK STRL BLUE (TOWEL DISPOSABLE) ×3 IMPLANT
TRAY LAPAROSCOPIC (CUSTOM PROCEDURE TRAY) ×3 IMPLANT
TROCAR BLADELESS OPT 5 100 (ENDOMECHANICALS) ×3 IMPLANT
TROCAR XCEL BLUNT TIP 100MML (ENDOMECHANICALS) ×3 IMPLANT

## 2019-09-06 NOTE — Anesthesia Procedure Notes (Signed)
Procedure Name: Intubation Date/Time: 09/06/2019 10:22 AM Performed by: Wanita Chamberlain, CRNA Pre-anesthesia Checklist: Patient identified, Timeout performed, Emergency Drugs available, Suction available and Patient being monitored Patient Re-evaluated:Patient Re-evaluated prior to induction Oxygen Delivery Method: Circle system utilized Preoxygenation: Pre-oxygenation with 100% oxygen Induction Type: IV induction Ventilation: Mask ventilation without difficulty Laryngoscope Size: Mac and 4 Grade View: Grade II Tube type: Oral Tube size: 7.5 mm Number of attempts: 1 Airway Equipment and Method: Stylet Placement Confirmation: breath sounds checked- equal and bilateral,  CO2 detector,  positive ETCO2 and ETT inserted through vocal cords under direct vision Secured at: 23 cm Tube secured with: Tape Dental Injury: Teeth and Oropharynx as per pre-operative assessment

## 2019-09-06 NOTE — Anesthesia Preprocedure Evaluation (Addendum)
Anesthesia Evaluation  Patient identified by MRN, date of birth, ID band Patient awake    Reviewed: Allergy & Precautions, NPO status , Patient's Chart, lab work & pertinent test results  Airway Mallampati: II  TM Distance: >3 FB Neck ROM: Full    Dental no notable dental hx. (+) Partial Upper, Dental Advisory Given   Pulmonary sleep apnea and Continuous Positive Airway Pressure Ventilation ,    Pulmonary exam normal breath sounds clear to auscultation       Cardiovascular hypertension, Normal cardiovascular exam Rhythm:Regular Rate:Normal     Neuro/Psych PTSD negative psych ROS   GI/Hepatic Neg liver ROS, GERD  ,  Endo/Other  diabetes  Renal/GU negative Renal ROS  negative genitourinary   Musculoskeletal negative musculoskeletal ROS (+)   Abdominal   Peds negative pediatric ROS (+)  Hematology negative hematology ROS (+)   Anesthesia Other Findings   Reproductive/Obstetrics negative OB ROS                            Anesthesia Physical Anesthesia Plan  ASA: III  Anesthesia Plan: General   Post-op Pain Management:    Induction: Intravenous  PONV Risk Score and Plan: 3 and Ondansetron, Dexamethasone and Treatment may vary due to age or medical condition  Airway Management Planned: Oral ETT  Additional Equipment:   Intra-op Plan:   Post-operative Plan: Extubation in OR  Informed Consent: I have reviewed the patients History and Physical, chart, labs and discussed the procedure including the risks, benefits and alternatives for the proposed anesthesia with the patient or authorized representative who has indicated his/her understanding and acceptance.     Dental advisory given  Plan Discussed with: CRNA and Surgeon  Anesthesia Plan Comments:         Anesthesia Quick Evaluation

## 2019-09-06 NOTE — Op Note (Addendum)
09/06/2019  Shubh D Mikita 03/15/1951   PREOPERATIVE DIAGNOSIS: Left inguinal hernia containing bladder  POSTOPERATIVE DIAGNOSIS: Left indirect inguinal hernia containing bladder  PROCEDURE: Laparoscopic repair of Left indirect inguinal hernia (containing bladder) with  mesh (TAPP).   SURGEON: Leighton Ruff. Redmond Pulling, MD FACS  ASSISTANT SURGEON: None.   ANESTHESIA: General plus local consisting of 0.25% Marcaine with epi.   ESTIMATED BLOOD LOSS: Minimal.   FINDINGS: The patient had a indirect hernia on the left. A moderate portion of the bladder was contained within the hernia.    SPECIMEN: none  INDICATIONS FOR PROCEDURE: 69 year old male presented with a symptomatic left inguinal hernia.  He had preoperative imaging at the urologist office which indicated that portion of his bladder was within the left hernia. The risks and benefits including but not limited to bleeding, infection, chronic inguinal pain, nerve entrapment, hernia recurrence, mesh complications, hematoma formation, urinary retention, injury to the testicles or the ovaries, numbness in the groin, blood clots, injury to the surrounding structures, and anesthesia risk was discussed with the patient.  DESCRIPTION OF PROCEDURE: After obtaining verbal consent and marking  the left groin in the holding area with the patient confirming the  operative site, the patient was then taken back to the operating room, placed  supine on the operating room table. General endotracheal anesthesia was  established.  Since the patient had a known hernia containing a portion of his bladder I elected for the nurse to place a Foley catheter to ensure that the bladder was decompressed during surgery.  His arms were tucked at his sides.  Received preoperative Tylenol and gabapentin and E rasp protocol was used.  Sequential compression devices were placed. The  abdomen and groin were prepped and draped in the usual standard surgical  fashion with  ChloraPrep.  IV  antibiotics prior to the incision. A surgical time-out was performed.  Local was infiltrated at the base of the umbilicus.   Next, a 1-cm vertical infraumbilical incision was made with a #11 blade. The fascia  was grasped and lifted anteriorly. Next, the fascia was incised, and  the abdominal cavity was entered. Pursestring suture was placed around  the fascial edges using a 0 Vicryl. A 12-mm Hasson trocar was placed.  Pneumoperitoneum was smoothly established up to a patient pressure of 15  mmHg. Laparoscope was advanced. There was no evidence of a  contralateral hernia. The patient had a defect lateral to  the inferior epigastric vessel, consistent with an indirect left inguinal Hernia.  I visualized the decompressed bladder with the Foley balloon.  A portion of the urinary bladder was also rising up out of the pelvis extending down into the inguinal canal.. Two 5-mm trocars were placed, one on the right, one on the left  in the midclavicular line slightly above the level of the umbilicus all  under direct visualization. After local had been infiltrated, I then  made incision along the peritoneum on the left, starting 2 inches above  the anterior superior iliac spine and caring it medial  toward the median umbilical ligament in a lazy S configuration using  Endo Shears with electrocautery. The peritoneal flap was then gently  dissected downward from the anterior abdominal wall taking care not to  injure the inferior epigastric vessels.  I also ensured that I was anterior to the bladder wall.  The pubic bone was identified.  The testicular vessels were identified.  Using  traction and counter traction with short graspers, I reduced the herniated  portions of the bladder in  its entirety.  The bladder was not injured.  Was no longer telescoping down the inguinal canal and laid decompressed within the pelvis. the testicular vessels had been identified and preserved. The vas  deferens was identified and preserved, and the hernia sac was stripped from those to  surrounding structures. I then went about creating a large pocket by  lifting the peritoneum of the pelvic floor. I took great care not to  injure the iliac vessels.   Local anesthetic was injected 2 finger breadths below and medial to the anterior superior iliac spine as well as along the left groin prior to placing the mesh. I then obtained a piece of large Bard 3D max mesh placed it through the Hasson trocar, half of it covered medial  to the inferior epigastric vessels and half of it lateral to the  inferior epigastric vessels. The defect was well  covered with the mesh. I then secured the mesh to the abdominal wall  using an Ethicon secure strap tack. 1 Tack was placed through  the Cooper's ligament, one tack on the medial side of the inferior epigastric  vessel and 1 tack out laterally. No tacks were placed below the  shelving edge of the inguinal ligament. Pneumoperitoneum was reduced  to 8 mmHg. I then brought the peritoneal flap back up to the abdominal  wall and tacked it to the abdominal wall using 3 tacks. There was no  defect in the peritoneum, and the mesh was well covered.  I then placed local along the bilateral lateral abdominal walls as a tap block using laparoscopic guidance.  I removed the  Hasson trocar and tied down the previously placed pursestring suture.  The closure was viewed laparoscopically. There was no evidence of  fascial defect. There was no air leak at the umbilicus. There was no  evidence of injury to surrounding structures. Pneumoperitoneum was  released, and the remaining trocars were removed. All skin incisions  were closed with a 4-0 Monocryl in a subcuticular fashion followed by  application of Dermabond.  The Foley catheter was removed.  All needle, instrument, and sponge counts  were correct x2. There are no immediate complications. The patient  tolerated the  procedure well. The patient was extubated and taken to the  recovery room in stable condition.  Attempted to reach wife twice via number left inchart - went straight to voicemail. Left brief VM Leighton Ruff. Redmond Pulling, MD, FACS General, Bariatric, & Minimally Invasive Surgery [facility], [place]

## 2019-09-06 NOTE — Discharge Instructions (Signed)
[name] LAPAROSCOPIC SURGERY: POST OP INSTRUCTIONS Always review your discharge instruction sheet given to you by the facility where your surgery was performed. IF YOU HAVE DISABILITY OR FAMILY LEAVE FORMS, YOU MUST BRING THEM TO THE OFFICE FOR PROCESSING.   DO NOT GIVE THEM TO YOUR DOCTOR.  PAIN CONTROL  1. First take acetaminophen (Tylenol) AND/or ibuprofen (Advil) to control your pain after surgery.  Follow directions on package.  Taking acetaminophen (Tylenol) and/or ibuprofen (Advil) regularly after surgery will help to control your pain and lower the amount of prescription pain medication you may need.  You should not take more than 3,000 mg (3 grams) of acetaminophen (Tylenol) in 24 hours.  You should not take ibuprofen (Advil), aleve, motrin, naprosyn or other NSAIDS if you have a history of stomach ulcers or chronic kidney disease.  2. A prescription for pain medication may be given to you upon discharge.  Take your pain medication as prescribed, if you still have uncontrolled pain after taking acetaminophen (Tylenol) or ibuprofen (Advil). 3. Use ice packs to help control pain. 4. If you need a refill on your pain medication, please contact your pharmacy.  They will contact our office to request authorization. Prescriptions will not be filled after 5pm or on week-ends.  HOME MEDICATIONS 5. Take your usually prescribed medications unless otherwise directed.  DIET 6. You should follow a light diet the first few days after arrival home.  Be sure to include lots of fluids daily. Avoid fatty, fried foods.   CONSTIPATION 7. It is common to experience some constipation after surgery and if you are taking pain medication.  Increasing fluid intake and taking a stool softener (such as Colace) will usually help or prevent this problem from occurring.  A mild laxative (Milk of Magnesia or Miralax) should be taken according to package instructions if there are no bowel  movements after 48 hours.  WOUND/INCISION CARE 8. Most patients will experience some swelling and bruising in the area of the incisions.  Ice packs will help.  Swelling and bruising can take several days to resolve.  9. Unless discharge instructions indicate otherwise, follow guidelines below  a. STERI-STRIPS - you may remove your outer bandages 48 hours after surgery, and you may shower at that time.  You have steri-strips (small skin tapes) in place directly over the incision.  These strips should be left on the skin for 7-10 days.   b. DERMABOND/SKIN GLUE - you may shower in 24 hours.  The glue will flake off over the next 2-3 weeks. 10. Any sutures or staples will be removed at the office during your follow-up visit.  ACTIVITIES 11. You may resume regular (light) daily activities beginning the next day--such as daily self-care, walking, climbing stairs--gradually increasing activities as tolerated.  You may have sexual intercourse when it is comfortable.  Refrain from any heavy lifting or straining until approved by your doctor. a. You may drive when you are no longer taking prescription pain medication, you can comfortably wear a seatbelt, and you can safely maneuver your car and apply brakes.  FOLLOW-UP 12. You should see your doctor in the office for a follow-up appointment approximately 2-3 weeks after your surgery.  You should have been given your post-op/follow-up appointment when your surgery was scheduled.  If you did not receive a post-op/follow-up appointment, make sure that you call for this appointment within a day or two after you arrive home to insure a convenient appointment time.  OTHER  INSTRUCTIONS 13. DO NOT LIFT/PUSH/PULL ANYTHING GREATER THAN 10LBS FOR 4 WEEKS  WHEN TO CALL YOUR DOCTOR: 1. Fever over 101.0 2. Inability to urinate 3. Continued bleeding from incision. 4. Increased pain, redness, or drainage from the incision. 5. Increasing abdominal pain  The clinic  staff is available to answer your questions during regular business hours.  Please don't hesitate to call and ask to speak to one of the nurses for clinical concerns.  If you have a medical emergency, go to the nearest emergency room or call 911.  A surgeon from [facility] is always on call at the hospital. [address] [place], [place], [place]  [address] ? [address], [place], [place]   [address] [phone] ? [phone] ? FAX [phone] Web site: [email or web address]   Post Anesthesia Home Care Instructions  Activity: Get plenty of rest for the remainder of the day. A responsible individual must stay with you for 24 hours following the procedure.  For the next 24 hours, DO NOT: -Drive a car -Paediatric nurse -Drink alcoholic beverages -Take any medication unless instructed by your physician -Make any legal decisions or sign important papers.  Meals: Start with liquid foods such as gelatin or soup. Progress to regular foods as tolerated. Avoid greasy, spicy, heavy foods. If nausea and/or vomiting occur, drink only clear liquids until the nausea and/or vomiting subsides. Call your physician if vomiting continues.  Special Instructions/Symptoms: Your throat may feel dry or sore from the anesthesia or the breathing tube placed in your throat during surgery. If this causes discomfort, gargle with warm salt water. The discomfort should disappear within 24 hours.

## 2019-09-06 NOTE — Transfer of Care (Signed)
Immediate Anesthesia Transfer of Care Note  Patient: [name]  Procedure(s) Performed: LAPAROSCOPIC LEFT INGUINAL HERNIA WITH MESH (Left Abdomen)  Patient Location: PACU  Anesthesia Type:General  Level of Consciousness: drowsy and patient cooperative  Airway & Oxygen Therapy: Patient Spontanous Breathing and Patient connected to nasal cannula oxygen  Post-op Assessment: Report given to RN and Post -op Vital signs reviewed and stable  Post vital signs: Reviewed and stable  Last Vitals:  Vitals Value Taken Time  BP 138/58 09/06/19 1149  Temp    Pulse 83 09/06/19 1151  Resp 15 09/06/19 1151  SpO2 100 % 09/06/19 1151  Vitals shown include unvalidated device data.  Last Pain:  Vitals:   09/06/19 0956  TempSrc: Oral  PainSc: 0-No pain      Patients Stated Pain Goal: 4 ([id number])  Complications: No apparent anesthesia complications

## 2019-09-06 NOTE — Anesthesia Postprocedure Evaluation (Signed)
Anesthesia Post Note  Patient: [name]  Procedure(s) Performed: LAPAROSCOPIC LEFT INGUINAL HERNIA WITH MESH (Left Abdomen)     Patient location during evaluation: PACU Anesthesia Type: General Level of consciousness: awake and alert Pain management: pain level controlled Vital Signs Assessment: post-procedure vital signs reviewed and stable Respiratory status: spontaneous breathing, nonlabored ventilation, respiratory function stable and patient connected to nasal cannula oxygen Cardiovascular status: blood pressure returned to baseline and stable Postop Assessment: no apparent nausea or vomiting Anesthetic complications: no    Last Vitals:  Vitals:   09/06/19 1230 09/06/19 1245  BP: 137/62 (!) 132/50  Pulse: 77 65  Resp: 13 10  Temp:    SpO2: 96% 96%    Last Pain:  Vitals:   09/06/19 1230  TempSrc:   PainSc: 0-No pain                 Nathon Stefanski S

## 2019-09-06 NOTE — H&P (Signed)
[name] is an 69 y.o. male.   Chief Complaint: here for surgery HPI: 69 yo male presents for repair of known L inguinal hernia containing bladder. Denies any new medical events or changes since seen in clinic.   The patient is a 69 year old male who presents with an inguinal hernia. He is referred by Dr [name] at [facility] urology for evaluation of a left inguinal hernia. He is accompanied by his wife. He states that he has had an intermittent bulge in his left groin for about 2 years now. It never really bothered him until recently. He states that its to come harder for him to urinate. He states that he may have to push on the area to help empty his bladder. He denies any nocturia. He does report some weakness in his straining. He also reports a sensation of incomplete emptying. His primary care doctor placed him on Flomax with no improvement in his symptoms that he was referred to urology. Urology examined the patient fell he had a left inguinal hernia. He denies any burning, shooting or stabbing pain in the groin. He denies any prior abdominal surgery. His primary care doctors at the [place]. He denies any smoking. He denies any chest pain, chest pressure, TIAs or amaurosis fugax. He does not have a history of MI or stroke. He is not on blood thinners. He states that he can't take NSAIDs because of his kidneys  Past Medical History:  Diagnosis Date  . Allergic rhinitis   . Anxiety   . Carpal tunnel syndrome on right   . Coronary artery disease   . Depression   . Diabetes mellitus   . DJD (degenerative joint disease)   . Dysphagia   . GERD (gastroesophageal reflux disease)   . Hyperlipidemia   . Hypertension   . Hypothyroidism   . Left inguinal hernia   . Mental disorder   . Narcolepsy    takes provigil  . OSA on CPAP   . Osteoporosis   . Peripheral neuropathy   . 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 PTSD (post-traumatic stress  disorder)   . Weakness of left leg   . Wears glasses     Past Surgical History:  Procedure Laterality Date  . CARDIAC CATHETERIZATION    . CERVICAL DISC SURGERY     x 2  . COLONOSCOPY    . HIP ARTHROPLASTY     right  . JOINT REPLACEMENT    . KNEE ARTHROPLASTY     bilateral  . LUMBAR LAMINECTOMY/DECOMPRESSION MICRODISCECTOMY  08/31/2011   Procedure: LUMBAR LAMINECTOMY/DECOMPRESSION MICRODISCECTOMY;  Surgeon: Otilio Connors, MD;  Location: [facility];  Service: Neurosurgery;  Laterality: N/A;  LumbarThree to Lumbar Five Laminectomy, possible Lumbar Four-Five Diskectomy  . SHOULDER SURGERY     left  . UPPER GI ENDOSCOPY      History reviewed. No pertinent family history. Social History:  reports that he has never smoked. He has never used smokeless tobacco. He reports that he does not drink alcohol or use drugs.  Allergies:  Allergies  Allergen Reactions  . Ace Inhibitors Swelling  . Sulfa Antibiotics Swelling  . Sulfamethoxazole Swelling    Medications Prior to Admission  Medication Sig Dispense Refill  . fluticasone (FLONASE) 50 MCG/ACT nasal spray Place 2 sprays into both nostrils daily.    Marland Kitchen losartan (COZAAR) 100 MG tablet Take 100 mg by mouth every evening.    . Probiotic Product (PROBIOTIC  DAILY PO) Take by mouth.    . spironolactone (ALDACTONE) 25 MG tablet Take 25 mg by mouth daily.    . tamsulosin (FLOMAX) 0.4 MG CAPS capsule Take 0.4 mg by mouth every morning.    Marland Kitchen amLODipine (NORVASC) 10 MG tablet every morning.     . bromocriptine (PARLODEL) 5 MG capsule Take 5 mg by mouth 2 (two) times daily.     . Cholecalciferol (VITAMIN D3) 1000 UNITS CAPS Take by mouth every morning.      . fexofenadine (ALLEGRA) 180 MG tablet Take 180 mg by mouth daily.      Marland Kitchen gabapentin (NEURONTIN) 300 MG capsule Take 300 mg by mouth 3 (three) times daily.    . hydrOXYzine (ATARAX/VISTARIL) 10 MG tablet Take 20 mg by mouth at bedtime.    . Levothyroxine Sodium (SYNTHROID PO) Take 25 mcg  by mouth daily.     . metFORMIN (GLUCOPHAGE) 500 MG tablet Take 500 mg by mouth 2 (two) times daily.      . modafinil (PROVIGIL) 200 MG tablet Take 1.5 tablets (300 mg total) by mouth daily. (Patient taking differently: Take 200 mg by mouth daily. 1.5 daily) 45 tablet 0  . PARoxetine (PAXIL) 40 MG tablet Take 40 mg by mouth at bedtime.      . polyvinyl alcohol-povidone (HYPOTEARS) 1.4-0.6 % ophthalmic solution Place 1 drop into both eyes 3 (three) times daily.     . pravastatin (PRAVACHOL) 20 MG tablet Take 20 mg by mouth at bedtime.     . temazepam (RESTORIL) 15 MG capsule Take 30 mg by mouth at bedtime.    . vitamin B-12 (CYANOCOBALAMIN) 500 MCG tablet Take 500 mcg by mouth daily.    . vitamin E 400 UNIT capsule Take 400 Units by mouth daily.       No results found for this or any previous visit (from the past 48 hour(s)). No results found.  Review of Systems  All other systems reviewed and are negative.   Height 6' 1.5" (1.867 m), weight 112 kg. Physical Exam  Vitals reviewed. Constitutional: He is oriented to person, place, and time. He appears well-developed and well-nourished. No distress.  HENT:  Head: Normocephalic and atraumatic.  Right Ear: External ear normal.  Left Ear: External ear normal.  Eyes: Conjunctivae are normal. No scleral icterus.  Neck: No tracheal deviation present. No thyromegaly present.  Cardiovascular: Normal rate and normal heart sounds.  Respiratory: Effort normal and breath sounds normal. No stridor. No respiratory distress. He has no wheezes.  GI: Soft. He exhibits no distension. There is no abdominal tenderness. There is no rebound. A hernia is present. Hernia confirmed positive in the left inguinal area.  Musculoskeletal:        General: No tenderness or edema.     Cervical back: Normal range of motion and neck supple.  Lymphadenopathy:    He has no cervical adenopathy.  Neurological: He is alert and oriented to person, place, and time. He  exhibits normal muscle tone.  Skin: Skin is warm and dry. No rash noted. He is not diaphoretic. No erythema. No pallor.  Psychiatric: He has a normal mood and affect. His behavior is normal. Judgment and thought content normal.     Assessment/Plan Left inguinal urinary bladder hernia HTN DM2 OSA on CPAP Obesity  To OR for lap repair of L inguinal hernia containing bladder ERAS protocol IV abx All questions asked and answered  Greer Pickerel, MD 09/06/2019, 9:43 AM

## 2019-10-08 HISTORY — PX: EYE SURGERY: SHX253

## 2019-12-06 ENCOUNTER — Other Ambulatory Visit: Payer: Self-pay

## 2019-12-07 ENCOUNTER — Ambulatory Visit: Payer: Non-veteran care | Admitting: Cardiology

## 2019-12-07 ENCOUNTER — Encounter: Payer: Self-pay | Admitting: Cardiology

## 2019-12-07 ENCOUNTER — Other Ambulatory Visit: Payer: Self-pay

## 2019-12-07 VITALS — BP 146/72 | HR 66 | Temp 97.6°F | Resp 15 | Ht 73.0 in | Wt 245.0 lb

## 2019-12-07 DIAGNOSIS — Z9989 Dependence on other enabling machines and devices: Secondary | ICD-10-CM

## 2019-12-07 DIAGNOSIS — I10 Essential (primary) hypertension: Secondary | ICD-10-CM

## 2019-12-07 DIAGNOSIS — Z6832 Body mass index (BMI) 32.0-32.9, adult: Secondary | ICD-10-CM

## 2019-12-07 DIAGNOSIS — R0609 Other forms of dyspnea: Secondary | ICD-10-CM

## 2019-12-07 DIAGNOSIS — E1140 Type 2 diabetes mellitus with diabetic neuropathy, unspecified: Secondary | ICD-10-CM

## 2019-12-07 DIAGNOSIS — E6609 Other obesity due to excess calories: Secondary | ICD-10-CM

## 2019-12-07 DIAGNOSIS — I2510 Atherosclerotic heart disease of native coronary artery without angina pectoris: Secondary | ICD-10-CM

## 2019-12-07 DIAGNOSIS — I491 Atrial premature depolarization: Secondary | ICD-10-CM

## 2019-12-07 DIAGNOSIS — G4733 Obstructive sleep apnea (adult) (pediatric): Secondary | ICD-10-CM

## 2019-12-07 DIAGNOSIS — E782 Mixed hyperlipidemia: Secondary | ICD-10-CM

## 2019-12-07 MED ORDER — ASPIRIN EC 81 MG PO TBEC: 81.0000 mg | DELAYED_RELEASE_TABLET | Freq: Every day | ORAL | 0 refills | Status: AC

## 2019-12-07 NOTE — Progress Notes (Signed)
Coronary angiogram 09/28/2004: Mild 20 to 30% stenosis in obtuse marginals, 10 to 15% stenosis in proximal RCA, normal LAD.  Normal LVEF at 50 to 5%.

## 2019-12-07 NOTE — Progress Notes (Signed)
REASON FOR CONSULT: Chest Pain  Chief Complaint  Patient presents with  . New Patient (Initial Visit)  . Shortness of Breath    REQUESTING PHYSICIAN: ( also sees Dr. Daneil Odonnell at [facility]).  Tim Blocker, MD [place],  [place] [address]  Former Cardiology Providers: [name]  Primary Cardiologist: Tim Kras, DO (established care 12/07/2019)  HPI  [name] is a 69 y.o. male who presents to the office with a chief complaint of " shortness of breath." Patient's past medical history and cardiac risk factors include: Nonobstructive coronary artery disease, hypertension, hyperlipidemia, non-insulin-dependent diabetes mellitus type 2, OSA on CPAP, advanced age, obesity.  Patient's history of present illness along with his past medical history was obtained by the patient and his wife [name] over the phone.  [name] was on speaker phone throughout the whole encounter.  Dyspnea on exertion: Patient states that she has been having shortness of breath for some time now since December 2020.  Symptoms are brought on by effort related activities such as walking for longer distances, pushing a self-propelled lawnmower, and with overexertion.  He states that the symptoms are not progressive and have remained the same.  He takes frequent breaks when he does his lawn mowing to alleviate the symptoms.  He does not have effort related chest pain but at times can have diaphoresis.  He denies nausea or vomiting.  He has been compliant with his medications.  Patient was recently at the care of Dr. Terrence Dupont and had undergone a left heart catheterization.  We requested records during his office visit to see what his angiography showed.  Findings noted below.  Denies prior history of myocardial infarction, congestive heart failure, deep venous thrombosis, pulmonary embolism, stroke, transient ischemic attack.  FUNCTIONAL STATUS: He mows his lawn using a self-propelled machine, does  yardwork as well.    ALLERGIES: Allergies  Allergen Reactions  . Ace Inhibitors Swelling  . Sulfa Antibiotics Swelling  . Sulfamethoxazole Swelling    MEDICATION LIST PRIOR TO VISIT: Current Outpatient Medications on File Prior to Visit  Medication Sig Dispense Refill  . amLODipine (NORVASC) 10 MG tablet every morning.     . Cholecalciferol (VITAMIN D3) 1000 UNITS CAPS Take by mouth every morning.      . fexofenadine (ALLEGRA) 180 MG tablet Take 180 mg by mouth daily.      . fluticasone (FLONASE) 50 MCG/ACT nasal spray Place 2 sprays into both nostrils daily.    Marland Kitchen gabapentin (NEURONTIN) 300 MG capsule Take 300 mg by mouth 3 (three) times daily.    . hydrOXYzine (ATARAX/VISTARIL) 10 MG tablet Take 20 mg by mouth at bedtime.    Marland Kitchen losartan (COZAAR) 100 MG tablet Take 100 mg by mouth every evening.    . metFORMIN (GLUCOPHAGE) 500 MG tablet Take 500 mg by mouth 2 (two) times daily.      . modafinil (PROVIGIL) 200 MG tablet Take 1.5 tablets (300 mg total) by mouth daily. (Patient taking differently: Take 200 mg by mouth daily. 1.5 daily) 45 tablet 0  . moxifloxacin (VIGAMOX) 0.5 % ophthalmic solution 1 drop 3 (three) times daily.    Marland Kitchen PARoxetine (PAXIL) 40 MG tablet Take 40 mg by mouth at bedtime.      . polyvinyl alcohol-povidone (HYPOTEARS) 1.4-0.6 % ophthalmic solution Place 1 drop into both eyes 3 (three) times daily.     . pravastatin (PRAVACHOL) 20 MG tablet Take 20 mg by mouth at bedtime.     Marland Kitchen  prednisoLONE acetate (PRED FORTE) 1 % ophthalmic suspension 1 drop in the morning, at noon, and at bedtime.    . Probiotic Product (PROBIOTIC DAILY PO) Take by mouth.    . spironolactone (ALDACTONE) 25 MG tablet Take 25 mg by mouth daily.    . tamsulosin (FLOMAX) 0.4 MG CAPS capsule Take 0.4 mg by mouth every morning.    . temazepam (RESTORIL) 15 MG capsule Take 30 mg by mouth at bedtime.    . vitamin B-12 (CYANOCOBALAMIN) 500 MCG tablet Take 500 mcg by mouth daily.    . vitamin E 400 UNIT  capsule Take 400 Units by mouth daily.      No current facility-administered medications on file prior to visit.    PAST MEDICAL HISTORY: Past Medical History:  Diagnosis Date  . Allergic rhinitis   . Anxiety   . Carpal tunnel syndrome on right   . Coronary artery disease   . Depression   . Diabetes mellitus   . DJD (degenerative joint disease)   . Dysphagia   . GERD (gastroesophageal reflux disease)   . Hyperlipidemia   . Hypertension   . Hypothyroidism   . Left inguinal hernia   . Mental disorder   . Narcolepsy    takes provigil  . OSA on CPAP   . Osteoporosis   . Peripheral neuropathy   . 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 PTSD (post-traumatic stress disorder)   . Weakness of left leg   . Wears glasses     PAST SURGICAL HISTORY: Past Surgical History:  Procedure Laterality Date  . CARDIAC CATHETERIZATION    . CERVICAL DISC SURGERY     x 2  . COLONOSCOPY    . HIP ARTHROPLASTY     right  . INGUINAL HERNIA REPAIR Left 09/06/2019   Procedure: LAPAROSCOPIC LEFT INGUINAL HERNIA WITH MESH;  Surgeon: Greer Pickerel, MD;  [facility];  Service: General;  Laterality: Left;  . JOINT REPLACEMENT    . KNEE ARTHROPLASTY     bilateral  . LUMBAR LAMINECTOMY/DECOMPRESSION MICRODISCECTOMY  08/31/2011   Procedure: LUMBAR LAMINECTOMY/DECOMPRESSION MICRODISCECTOMY;  Surgeon: Otilio Connors, MD;  Location: [facility];  Service: Neurosurgery;  Laterality: N/A;  LumbarThree to Lumbar Five Laminectomy, possible Lumbar Four-Five Diskectomy  . SHOULDER SURGERY     left  . UPPER GI ENDOSCOPY      FAMILY HISTORY: The patient family history includes Cancer in his brother; Diabetes in his mother.   SOCIAL HISTORY:  The patient  reports that he has never smoked. He has never used smokeless tobacco. He reports that he does not drink alcohol or use drugs.  REVIEW OF SYSTEMS: Review of Systems  Constitution: Negative for chills  and fever.  HENT: Negative for ear discharge, ear pain and nosebleeds.   Eyes: Negative for blurred vision and discharge.  Cardiovascular: Positive for dyspnea on exertion (chronic on stable), orthopnea and syncope (last episode in 2018). Negative for chest pain, claudication, leg swelling, near-syncope, palpitations and paroxysmal nocturnal dyspnea.  Respiratory: Positive for sleep disturbances due to breathing and snoring. Negative for cough and shortness of breath.   Endocrine: Negative for polydipsia, polyphagia and polyuria.  Hematologic/Lymphatic: Negative for bleeding problem.  Skin: Negative for flushing and nail changes.  Musculoskeletal: Negative for muscle cramps, muscle weakness and myalgias.  Gastrointestinal: Negative for abdominal pain, dysphagia, hematemesis, hematochezia, melena, nausea and vomiting.  Neurological: Positive for paresthesias. Negative for dizziness, focal weakness and light-headedness.  PHYSICAL EXAM: Vitals with BMI 12/07/2019 09/06/2019 09/06/2019  Height 6\' 1"  - -  Weight 245 lbs - -  BMI [id number] - -  Systolic [id number]  Diastolic 72 58 64  Pulse 66 78 86    CONSTITUTIONAL: Well-developed and well-nourished. No acute distress.  SKIN: Skin is warm and dry. No rash noted. No cyanosis. No pallor. No jaundice HEAD: Normocephalic and atraumatic.  EYES: No scleral icterus MOUTH/THROAT: Moist oral membranes.  NECK: No JVD present. No thyromegaly noted. No carotid bruits  LYMPHATIC: No visible cervical adenopathy.  CHEST Normal respiratory effort. No intercostal retractions  LUNGS: Clear to auscultation bilaterally.  No stridor. No wheezes. No rales.  CARDIOVASCULAR: Regular rate and rhythm, positive S1-S2, no murmurs rubs or gallops appreciated. ABDOMINAL: Obese, soft, nontender, nondistended, positive bowel sounds in all 4 quadrants, no apparent ascites.  EXTREMITIES: No peripheral edema, warm to touch, 2+ dorsalis pedis pulses, patient has a brace on his  both lower extremity secondary to foot drop HEMATOLOGIC: No significant bruising NEUROLOGIC: Oriented to person, place, and time. Nonfocal. Normal muscle tone.  PSYCHIATRIC: Normal mood and affect. Normal behavior. Cooperative  CARDIAC DATABASE: EKG:  12/07/2019: Normal sinus, 67 bpm, frequent PACs without underlying ischemia or injury pattern.  Echocardiogram: Several years ago at outside facility.   Stress Testing: Several years ago at outside facility.   Heart catheterization: 09/28/2004 by Dr. Terrence Dupont: Per report, LVEF of 50-55%, left main patent, LAD patent, diagonal 1 patent, diagonal 2 small patent, LCx 15 to 20% ostial stenosis, OM1 10-15% ostial stenosis, OM 2 and 3 small in caliber overall patent, RCA 10-15% mid stenosis.  LABORATORY DATA: CBC Latest Ref Rng & Units 09/03/2019 08/27/2011 11/01/2010  WBC 4.0 - 10.5 K/uL 5.3 5.9 9.5  Hemoglobin 13.0 - 17.0 g/dL 12.3(L) 13.9 9.7(L)  Hematocrit 39.0 - 52.0 % 40.8 42.4 31.4(L)  Platelets 150 - 400 K/uL 238 258 212    CMP Latest Ref Rng & Units 09/03/2019 09/05/2012 08/27/2011  Glucose 70 - 99 mg/dL 113(H) - 105(H)  BUN 8 - 23 mg/dL 23 26(A) 12  Creatinine 0.61 - 1.24 mg/dL 1.40(H) 1.7(A) 1.26  Sodium 135 - 145 mmol/L 139 139 139  Potassium 3.5 - 5.1 mmol/L 4.4 4.3 4.4  Chloride 98 - 111 mmol/L 103 - 101  CO2 22 - 32 mmol/L 26 - 30  Calcium 8.9 - 10.3 mg/dL 9.6 - 9.9  Total Protein 6.5 - 8.1 g/dL 7.4 - -  Total Bilirubin 0.3 - 1.2 mg/dL 0.4 - -  Alkaline Phos 38 - 126 U/L 72 88 -  AST 15 - 41 U/L 29 20 -  ALT 0 - 44 U/L 26 22 -    Lipid Panel  No results found for: CHOL, TRIG, HDL, CHOLHDL, VLDL, LDLCALC, LDLDIRECT, LABVLDL  Lab Results  Component Value Date   HGBA1C 6.3 (A) 09/05/2012   HGBA1C (H) 08/06/2008    6.7 (NOTE)   The ADA recommends the following therapeutic goal for glycemic   control related to Hgb A1C measurement:   Goal of Therapy:   < 7.0% Hgb A1C   Reference: American Diabetes Association: Clinical  Practice   Recommendations 2008, Diabetes Care,  2008, 31:(Suppl 1).   No components found for: NTPROBNP No results found for: TSH  FINAL MEDICATION LIST END OF ENCOUNTER: Meds ordered this encounter  Medications  . aspirin EC 81 MG tablet    Sig: Take 1 tablet (81 mg total) by mouth daily.    Dispense:  90 tablet    Refill:  0    Medications Discontinued During This Encounter  Medication Reason  . bromocriptine (PARLODEL) 5 MG capsule Patient Preference  . Levothyroxine Sodium (SYNTHROID PO) Patient Preference  . oxyCODONE (OXY IR/ROXICODONE) 5 MG immediate release tablet Patient Preference     Current Outpatient Medications:  .  amLODipine (NORVASC) 10 MG tablet, every morning. , Disp: , Rfl:  .  Cholecalciferol (VITAMIN D3) 1000 UNITS CAPS, Take by mouth every morning.  , Disp: , Rfl:  .  fexofenadine (ALLEGRA) 180 MG tablet, Take 180 mg by mouth daily.  , Disp: , Rfl:  .  fluticasone (FLONASE) 50 MCG/ACT nasal spray, Place 2 sprays into both nostrils daily., Disp: , Rfl:  .  gabapentin (NEURONTIN) 300 MG capsule, Take 300 mg by mouth 3 (three) times daily., Disp: , Rfl:  .  hydrOXYzine (ATARAX/VISTARIL) 10 MG tablet, Take 20 mg by mouth at bedtime., Disp: , Rfl:  .  losartan (COZAAR) 100 MG tablet, Take 100 mg by mouth every evening., Disp: , Rfl:  .  metFORMIN (GLUCOPHAGE) 500 MG tablet, Take 500 mg by mouth 2 (two) times daily.  , Disp: , Rfl:  .  modafinil (PROVIGIL) 200 MG tablet, Take 1.5 tablets (300 mg total) by mouth daily. (Patient taking differently: Take 200 mg by mouth daily. 1.5 daily), Disp: 45 tablet, Rfl: 0 .  moxifloxacin (VIGAMOX) 0.5 % ophthalmic solution, 1 drop 3 (three) times daily., Disp: , Rfl:  .  PARoxetine (PAXIL) 40 MG tablet, Take 40 mg by mouth at bedtime.  , Disp: , Rfl:  .  polyvinyl alcohol-povidone (HYPOTEARS) 1.4-0.6 % ophthalmic solution, Place 1 drop into both eyes 3 (three) times daily. , Disp: , Rfl:  .  pravastatin (PRAVACHOL) 20 MG  tablet, Take 20 mg by mouth at bedtime. , Disp: , Rfl:  .  prednisoLONE acetate (PRED FORTE) 1 % ophthalmic suspension, 1 drop in the morning, at noon, and at bedtime., Disp: , Rfl:  .  Probiotic Product (PROBIOTIC DAILY PO), Take by mouth., Disp: , Rfl:  .  spironolactone (ALDACTONE) 25 MG tablet, Take 25 mg by mouth daily., Disp: , Rfl:  .  tamsulosin (FLOMAX) 0.4 MG CAPS capsule, Take 0.4 mg by mouth every morning., Disp: , Rfl:  .  temazepam (RESTORIL) 15 MG capsule, Take 30 mg by mouth at bedtime., Disp: , Rfl:  .  vitamin B-12 (CYANOCOBALAMIN) 500 MCG tablet, Take 500 mcg by mouth daily., Disp: , Rfl:  .  vitamin E 400 UNIT capsule, Take 400 Units by mouth daily. , Disp: , Rfl:  .  aspirin EC 81 MG tablet, Take 1 tablet (81 mg total) by mouth daily., Disp: 90 tablet, Rfl: 0  IMPRESSION:    ICD-10-CM   1. Dyspnea on exertion  R06.00 EKG 12-Lead    PCV ECHOCARDIOGRAM COMPLETE    PCV MYOCARDIAL PERFUSION WITH LEXISCAN  2. Premature atrial contraction  I49.1 HOLTER MONITOR - 24 HOUR  3. Nonobstructive atherosclerosis of coronary artery  I25.10 aspirin EC 81 MG tablet  4. Essential hypertension  I10   5. Mixed hyperlipidemia  E78.2   6. Type 2 diabetes mellitus with diabetic neuropathy, without long-term current use of insulin (HCC)  E11.40   7. OSA on CPAP  G47.33    Z99.89   8. Class 1 obesity due to excess calories with serious comorbidity and body mass index (BMI) of 32.0 to 32.9 in adult  E66.09    Z68.32  RECOMMENDATIONS: MYLES SLOCUMB is a 70 y.o. male whose past medical history and cardiac risk factors include: Nonobstructive coronary artery disease, hypertension, hyperlipidemia, non-insulin-dependent diabetes mellitus type 2, OSA on CPAP, advanced age, obesity.  Dyspnea on exertion:   Patient symptoms have been chronic and stable but given his cardiovascular risk factors ischemic work-up is warranted.  Echocardiogram will be ordered to evaluate for structural heart  disease and left ventricular systolic function.  Nuclear stress test recommended to evaluate for reversible ischemia.  24-hour Holter to evaluate PVC and/or  arrhythmic burden.  Premature atrial contractions: Recommend 24-hour Holter.  History of nonobstructive coronary artery disease:  Recommend aspirin 81 mg p.o. daily.  Continue losartan, spironolactone, and statin therapy.  Benign essential hypertension: . Office blood pressure is at goal.  . Medication reconciled.  Marland Kitchen He is asked to keep a log of both blood pressure and pulse so that medications can be titrated based on a trend as opposed to isolated blood pressure readings in the office. . If the blood pressure is consistently greater than 110mmHg patient is asked to call the office or his primary care provider's office for medication titration sooner than the next office visit.  . Low salt diet recommended. A diet that is rich in fruits, vegetables, legumes, and low-fat dairy products and low in snacks, sweets, and meats (such as the Dietary Approaches to Stop Hypertension [DASH] diet).   Mixed hyperlipidemia: Continue statin therapy, managed by primary team.  Patient had labs done recently at outside facility last week of March per wife and will have it faxed to the office.  Also obtained records of his last [facility] during this office visit.  Had the patient's wife on the speaker phone as she is very active in his healthcare. Her questions and concerns were addressed to her satisfaction.   Orders Placed This Encounter  Procedures  . PCV MYOCARDIAL PERFUSION WITH LEXISCAN  . HOLTER MONITOR - 24 HOUR  . EKG 12-Lead  . PCV ECHOCARDIOGRAM COMPLETE   Total encounter time 60 minutes. Total Encounter Time as defined by the Centers for Medicare and Medicaid Services includes, in addition to the face-to-face time of a patient visit (documented in the note above) non-face-to-face time: obtaining and reviewing outside history, ordering and  reviewing medications, tests or procedures, care coordination (communications with other health care professionals or caregivers) and documentation in the medical record.  --Continue cardiac medications as reconciled in final medication list. --Return in about 6 weeks (around 01/18/2020) for re-evaluation of symptoms., review test results.. Or sooner if needed. --Continue follow-up with your primary care physician regarding the management of your other chronic comorbid conditions.  Patient's questions and concerns were addressed to his satisfaction. He voices understanding of the instructions provided during this encounter.   During this visit I reviewed and updated: Tobacco history  allergies medication reconciliation  medical history  surgical history  family history  social history.  This note was created using a voice recognition software as a result there may be grammatical errors inadvertently enclosed that do not reflect the nature of this encounter. Every attempt is made to correct such errors.  [name], [place], [facility]  Pager: [phone] Office: [phone]

## 2019-12-12 ENCOUNTER — Other Ambulatory Visit: Payer: Self-pay

## 2019-12-12 ENCOUNTER — Ambulatory Visit (INDEPENDENT_AMBULATORY_CARE_PROVIDER_SITE_OTHER): Payer: No Typology Code available for payment source | Admitting: Orthopaedic Surgery

## 2019-12-12 ENCOUNTER — Ambulatory Visit (INDEPENDENT_AMBULATORY_CARE_PROVIDER_SITE_OTHER): Payer: No Typology Code available for payment source

## 2019-12-12 ENCOUNTER — Encounter: Payer: Self-pay | Admitting: Orthopaedic Surgery

## 2019-12-12 VITALS — Ht 73.5 in | Wt 245.0 lb

## 2019-12-12 DIAGNOSIS — M8949 Other hypertrophic osteoarthropathy, multiple sites: Secondary | ICD-10-CM

## 2019-12-12 DIAGNOSIS — M160 Bilateral primary osteoarthritis of hip: Secondary | ICD-10-CM

## 2019-12-12 DIAGNOSIS — M25552 Pain in left hip: Secondary | ICD-10-CM

## 2019-12-12 DIAGNOSIS — M545 Low back pain, unspecified: Secondary | ICD-10-CM | POA: Insufficient documentation

## 2019-12-12 DIAGNOSIS — M159 Polyosteoarthritis, unspecified: Secondary | ICD-10-CM

## 2019-12-12 DIAGNOSIS — G8929 Other chronic pain: Secondary | ICD-10-CM

## 2019-12-12 HISTORY — DX: Low back pain, unspecified: M54.50

## 2019-12-12 HISTORY — DX: Bilateral primary osteoarthritis of hip: M16.0

## 2019-12-12 NOTE — Progress Notes (Signed)
Office Visit Note   Patient: [name]           Date of Birth: May 07, 1951           MRN: [id number] Visit Date: 12/12/2019              Requested by: Rogers Blocker, MD [address] [place],  [place] [address] PCP: Rogers Blocker, MD   Assessment & Plan: Visit Diagnoses:  1. Pain in left hip   2. Primary osteoarthritis involving multiple joints   3. Bilateral primary osteoarthritis of hip   4. Chronic bilateral low back pain without sciatica     Plan: [name] was recently evaluated through the [place] system in [place] for problems he is having in the area of his left hip.  He has end-stage osteoarthritis of his hip and they referred him for further evaluation.  He also has a chronic problem with his lumbar spine.  He did have a lumbar laminectomy by one of the neurosurgeons in 2013 and continues to be followed by Dr. Maryjean Ka for an occasional epidural steroid injection.  Based on his history and exam today I think the problem is more related to his back than his left hip.  He has an appointment with Dr. Maryjean Ka in the near future for another epidural steroid injection.  I like to see him back after that injection to monitor his response.  Hopefully that will resolve the pain he is having on the left side.  If not I consider cortisone injection of his left hip.  He is not using any ambulatory aid and does not appear to walk with a limp. I discussed all of the above with his caregiver by phone to be sure they want any further questions and that Mr. Kleintop understood the office visit evaluation.  Follow-Up Instructions: Return After evaluation by Dr. Maryjean Ka.   Orders:  Orders Placed This Encounter  Procedures  . XR Lumbar Spine 2-3 Views  . XR Pelvis 1-2 Views   No orders of the defined types were placed in this encounter.     Procedures: No procedures performed   Clinical Data: No additional findings.   Subjective: Chief Complaint  Patient presents with  . Left  Hip - Pain  Patient presents today for left hip pain. He said that it has been hurting on and off for years. He was sent over by the [place]. He had x-rays last month on 11/12/2019. He said that the pain only occurs when he moves a certain way. He has difficulty getting up. He is wanting to get an injection with Dr.Newton. He states that he was told his hip is bone on and bone, but does not have those x-rays with him today.  The concern at the [facility] was that his present pain might be attributed to his end-stage arthritis of the left hip.  He has not had any new numbness or tingling in either lower extremity.  He is diabetic and has neuropathy.  He wears bilateral ankle-foot orthotics for foot drop  HPI  Review of Systems   Objective: Vital Signs: Ht 6' 1.5" (1.867 m)   Wt 245 lb (111.1 kg)   BMI 31.89 kg/m   Physical Exam Constitutional:      Appearance: He is well-developed.  Eyes:     Pupils: Pupils are equal, round, and reactive to light.  Pulmonary:     Effort: Pulmonary effort is normal.  Skin:    General: Skin  is warm and dry.  Neurological:     Mental Status: He is alert and oriented to person, place, and time.  Psychiatric:        Behavior: Behavior normal.     Ortho Exam awake alert and oriented x3.  Comfortable sitting.  Did not use any ambulatory aid.  He had painless range of motion of his left hip with internal and external rotation even at the extremes.  Straight leg raise was negative.  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  He does have some altered sensibility in his feet related to the same.  Mild percussible tenderness along the left lumbosacral junction.  No flank pain.  No thoracic or lumbar pain no pain over the sacroiliac joint.  No pain over the lateral aspect of his hip  Specialty Comments:  No specialty comments available.  Imaging: XR Lumbar Spine 2-3 Views  Result Date: 12/12/2019 Films of lumbar spine  obtained in 2 projections.  There is loss of the normal lumbar lordosis.  Diffuse calcification of the anterior longitudinal ligament.  Diffuse facet joint sclerosis from L3-S1 with disc space narrowing at L3-4 and L4-5.  No listhesis.  Mild degenerative scoliosis to the left.  Films are consistent with advanced osteoarthritis  XR Pelvis 1-2 Views  Result Date: 12/12/2019 AP pelvis lateral left hip demonstrate advanced arthritic changes in his left hip.  Diffuse joint space narrowing with subchondral sclerosis on both sides of the joint and peripheral osteophytes.  There is some flattening of the femoral head.  All consistent with advanced osteoarthritis.  Has a right total hip replacement with possible slight superior migration consistent with polywear.  Patient is asymptomatic on the right    PMFS History: Patient Active Problem List   Diagnosis Date Noted  . Bilateral primary osteoarthritis of hip 12/12/2019  . Low back pain 12/12/2019  . OSA on CPAP 02/12/2019  . Essential hypertension 11/25/2010  . Diabetes mellitus type 2 in nonobese ([place]) 11/25/2010  . 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 Degenerative joint disease involving multiple joints 11/25/2010  . Allergic rhinitis due to pollen 11/25/2010   Past Medical History:  Diagnosis Date  . Allergic rhinitis   . Anxiety   . Carpal tunnel syndrome on right   . Coronary artery disease   . Depression   . Diabetes mellitus   . DJD (degenerative joint disease)   . Dysphagia   . GERD (gastroesophageal reflux disease)   . Hyperlipidemia   . Hypertension   . Hypothyroidism   . Left inguinal hernia   . Mental disorder   . Narcolepsy    takes provigil  . OSA on CPAP   . Osteoporosis   . Peripheral neuropathy   . 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 PTSD (post-traumatic stress disorder)   . Weakness of left leg   . Wears glasses     Family History   Problem Relation Age of Onset  . Diabetes Mother   . Cancer Brother     Past Surgical History:  Procedure Laterality Date  . CARDIAC CATHETERIZATION    . CERVICAL DISC SURGERY     x 2  . COLONOSCOPY    . HIP ARTHROPLASTY     right  . INGUINAL HERNIA REPAIR Left 09/06/2019   Procedure: LAPAROSCOPIC LEFT INGUINAL HERNIA WITH MESH;  Surgeon: Greer Pickerel, MD;  Location: [facility];  Service: General;  Laterality: Left;  . JOINT REPLACEMENT    . KNEE ARTHROPLASTY     bilateral  . LUMBAR LAMINECTOMY/DECOMPRESSION MICRODISCECTOMY  08/31/2011   Procedure: LUMBAR LAMINECTOMY/DECOMPRESSION MICRODISCECTOMY;  Surgeon: Otilio Connors, MD;  Location: [facility];  Service: Neurosurgery;  Laterality: N/A;  LumbarThree to Lumbar Five Laminectomy, possible Lumbar Four-Five Diskectomy  . SHOULDER SURGERY     left  . UPPER GI ENDOSCOPY     Social History   Occupational History  . Not on file  Tobacco Use  . Smoking status: Never Smoker  . Smokeless tobacco: Never Used  Substance and Sexual Activity  . Alcohol use: No  . Drug use: No  . Sexual activity: Not Currently

## 2019-12-18 ENCOUNTER — Ambulatory Visit: Payer: Medicare PPO

## 2019-12-18 ENCOUNTER — Other Ambulatory Visit: Payer: Self-pay

## 2019-12-18 DIAGNOSIS — I491 Atrial premature depolarization: Secondary | ICD-10-CM

## 2019-12-18 DIAGNOSIS — R0609 Other forms of dyspnea: Secondary | ICD-10-CM

## 2019-12-24 ENCOUNTER — Other Ambulatory Visit: Payer: Self-pay

## 2019-12-24 ENCOUNTER — Ambulatory Visit: Payer: Medicare PPO

## 2019-12-24 DIAGNOSIS — R0609 Other forms of dyspnea: Secondary | ICD-10-CM

## 2020-01-17 ENCOUNTER — Encounter: Payer: Self-pay | Admitting: Nurse Practitioner

## 2020-01-18 ENCOUNTER — Ambulatory Visit: Payer: Medicare PPO | Admitting: Cardiology

## 2020-01-18 ENCOUNTER — Other Ambulatory Visit: Payer: Self-pay

## 2020-01-18 ENCOUNTER — Encounter: Payer: Self-pay | Admitting: Cardiology

## 2020-01-18 VITALS — BP 150/72 | HR 57 | Resp 17 | Ht 73.0 in | Wt 243.0 lb

## 2020-01-18 DIAGNOSIS — E782 Mixed hyperlipidemia: Secondary | ICD-10-CM

## 2020-01-18 DIAGNOSIS — R0609 Other forms of dyspnea: Secondary | ICD-10-CM

## 2020-01-18 DIAGNOSIS — Z9989 Dependence on other enabling machines and devices: Secondary | ICD-10-CM

## 2020-01-18 DIAGNOSIS — E6609 Other obesity due to excess calories: Secondary | ICD-10-CM

## 2020-01-18 DIAGNOSIS — I10 Essential (primary) hypertension: Secondary | ICD-10-CM

## 2020-01-18 DIAGNOSIS — Z712 Person consulting for explanation of examination or test findings: Secondary | ICD-10-CM

## 2020-01-18 DIAGNOSIS — I2510 Atherosclerotic heart disease of native coronary artery without angina pectoris: Secondary | ICD-10-CM

## 2020-01-18 DIAGNOSIS — E1140 Type 2 diabetes mellitus with diabetic neuropathy, unspecified: Secondary | ICD-10-CM

## 2020-01-18 NOTE — Progress Notes (Signed)
[name] Date of Birth: [birth year] MRN: [id number] Primary Care Provider:Dean, Carolann Littler, MD ( also sees Dr. Daneil Odonnell at [facility]).  Former Cardiology Providers: Dr. Glennon Odonnell  Primary Cardiologist: Tim Kras, DO, [facility] (established care 12/07/2019)   Date: 01/18/20 Last Office Visit: 12/07/2019  Chief Complaint  Patient presents with  . Shortness of Breath  . Follow-up    6 week  . Results    HPI  [name] is a 69 y.o. male who presents to the office with a  chief complaint of " review of symptoms of shortness of breath and test results."  His past medical history and cardiovascular risk factors are: Nonobstructive coronary artery disease, hypertension, hyperlipidemia, non-insulin-dependent diabetes mellitus type 2, OSA on CPAP, advanced age, obesity.   During encounter we had called his wife [name] over the phone and she was on speaker phone throughout the entire encounter.  Patient was originally referred to the office for evaluation of by his primary care provider for evaluation of chest pain. Given the patient's symptoms and risk factors he was recommended to undergo an ischemic evaluation at the last office visit. Since then he has undergone echocardiogram and nuclear stress test.  Results of both are reviewed with the patient and her his wife over the phone at today's office visit and noted below for further reference.  Since last office visit patient states that he no longer has chest pain at rest or with effort related activities and symptoms are essentially resolved.  However, he continues to have effort related dyspnea which is chronic and stable and similar to what he had mentioned at the last office visit.  At last office visit patient was also recommended to wear a monitor to evaluate for underlying atrial fibrillation, arrhythmias, or ectopic burden.  Results were reviewed the patient and his wife at today's visit and noted below for further reference.   Patient states that he has lost weight since last office visit after being enrolled into a program with the [facility] called the "MOVE."  He has also been checking his weight and blood pressure on a daily basis and is currently being monitored remotely with the [place] at the blood pressure log that he brings in at today's office visit his systolic blood pressures are not very well controlled.  This may be contributing to his effort related dyspnea with ambulation.  Also reconciling his medications was difficult as he did not bring his medication bottles in at today's visit.  He states that he is on spironolactone on a daily basis according to him he may not be taking it.  I have asked him to review his antihypertensive medications with his provider at the [facility] hospital and may consider the addition of diuretic therapy if not already.  Denies prior history of myocardial infarction, congestive heart failure, deep venous thrombosis, pulmonary embolism, stroke, transient ischemic attack.  FUNCTIONAL STATUS: He mows his lawn using a self-propelled machine, does yardwork as well.    ALLERGIES: Allergies  Allergen Reactions  . Ace Inhibitors Swelling  . Sulfa Antibiotics Swelling  . Sulfamethoxazole Swelling    MEDICATION LIST PRIOR TO VISIT: Current Outpatient Medications on File Prior to Visit  Medication Sig Dispense Refill  . amLODipine (NORVASC) 10 MG tablet every morning.     Marland Kitchen aspirin EC 81 MG tablet Take 1 tablet (81 mg total) by mouth daily. 90 tablet 0  . Cholecalciferol (VITAMIN D3) 1000 UNITS CAPS Take by mouth  every morning.      . fexofenadine (ALLEGRA) 180 MG tablet Take 180 mg by mouth daily.      . fluticasone (FLONASE) 50 MCG/ACT nasal spray Place 2 sprays into both nostrils daily.    Marland Kitchen gabapentin (NEURONTIN) 300 MG capsule Take 300 mg by mouth 3 (three) times daily.    . hydrOXYzine (ATARAX/VISTARIL) 10 MG tablet Take 20 mg by mouth at bedtime.    Marland Kitchen losartan  (COZAAR) 100 MG tablet Take 100 mg by mouth every evening.    . metFORMIN (GLUCOPHAGE) 500 MG tablet Take 500 mg by mouth 2 (two) times daily.      . modafinil (PROVIGIL) 200 MG tablet Take 1.5 tablets (300 mg total) by mouth daily. (Patient taking differently: Take 200 mg by mouth daily. 1.5 daily) 45 tablet 0  . moxifloxacin (VIGAMOX) 0.5 % ophthalmic solution 1 drop 3 (three) times daily.    Marland Kitchen PARoxetine (PAXIL) 40 MG tablet Take 40 mg by mouth at bedtime.      . polyvinyl alcohol-povidone (HYPOTEARS) 1.4-0.6 % ophthalmic solution Place 1 drop into both eyes 3 (three) times daily.     . pravastatin (PRAVACHOL) 20 MG tablet Take 20 mg by mouth at bedtime.     . prednisoLONE acetate (PRED FORTE) 1 % ophthalmic suspension 1 drop in the morning, at noon, and at bedtime.    . Probiotic Product (PROBIOTIC DAILY PO) Take by mouth.    . spironolactone (ALDACTONE) 25 MG tablet Take 25 mg by mouth daily.    . tamsulosin (FLOMAX) 0.4 MG CAPS capsule Take 0.4 mg by mouth every morning.    . temazepam (RESTORIL) 15 MG capsule Take 30 mg by mouth at bedtime.    . vitamin B-12 (CYANOCOBALAMIN) 500 MCG tablet Take 500 mcg by mouth daily.    . vitamin E 400 UNIT capsule Take 400 Units by mouth daily.      No current facility-administered medications on file prior to visit.    PAST MEDICAL HISTORY: Past Medical History:  Diagnosis Date  . Allergic rhinitis   . Anxiety   . Carpal tunnel syndrome on right   . Coronary artery disease   . Depression   . Diabetes mellitus   . DJD (degenerative joint disease)   . Dysphagia   . GERD (gastroesophageal reflux disease)   . Hyperlipidemia   . Hypertension   . Hypothyroidism   . Left inguinal hernia   . Mental disorder   . Narcolepsy    takes provigil  . OSA on CPAP   . Osteoporosis   . Peripheral neuropathy   . 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 PTSD (post-traumatic stress disorder)   . Weakness of left leg   .  Wears glasses     PAST SURGICAL HISTORY: Past Surgical History:  Procedure Laterality Date  . CARDIAC CATHETERIZATION    . CERVICAL DISC SURGERY     x 2  . COLONOSCOPY    . HIP ARTHROPLASTY     right  . INGUINAL HERNIA REPAIR Left 09/06/2019   Procedure: LAPAROSCOPIC LEFT INGUINAL HERNIA WITH MESH;  Surgeon: Greer Pickerel, MD;  Location: [facility];  Service: General;  Laterality: Left;  . JOINT REPLACEMENT    . KNEE ARTHROPLASTY     bilateral  . LUMBAR LAMINECTOMY/DECOMPRESSION MICRODISCECTOMY  08/31/2011   Procedure: LUMBAR LAMINECTOMY/DECOMPRESSION MICRODISCECTOMY;  Surgeon: Otilio Connors, MD;  Location: [facility];  Service: Neurosurgery;  Laterality:  N/A;  LumbarThree to Lumbar Five Laminectomy, possible Lumbar Four-Five Diskectomy  . SHOULDER SURGERY     left  . UPPER GI ENDOSCOPY      FAMILY HISTORY: The patient family history includes Cancer in his brother; Diabetes in his mother.   SOCIAL HISTORY:  The patient  reports that he has never smoked. He has never used smokeless tobacco. He reports that he does not drink alcohol or use drugs.  REVIEW OF SYSTEMS: Review of Systems  Constitution: Negative for chills and fever.  HENT: Negative for ear discharge, ear pain and nosebleeds.   Eyes: Negative for blurred vision and discharge.  Cardiovascular: Positive for dyspnea on exertion (chronic on stable) and syncope (last episode in 2018). Negative for chest pain, claudication, leg swelling, near-syncope, orthopnea, palpitations and paroxysmal nocturnal dyspnea.  Respiratory: Positive for sleep disturbances due to breathing and snoring. Negative for cough and shortness of breath.   Endocrine: Negative for polydipsia, polyphagia and polyuria.  Hematologic/Lymphatic: Negative for bleeding problem.  Skin: Negative for flushing and nail changes.  Musculoskeletal: Negative for muscle cramps, muscle weakness and myalgias.  Gastrointestinal: Negative for abdominal  pain, dysphagia, hematemesis, hematochezia, melena, nausea and vomiting.  Neurological: Positive for paresthesias. Negative for dizziness, focal weakness and light-headedness.   PHYSICAL EXAM: Vitals with BMI 01/18/2020 12/12/2019 12/07/2019  Height 6\' 1"  6' 1.5" 6\' 1"   Weight 243 lbs 245 lbs 245 lbs  BMI 32.07 00.17 49.44  Systolic 967 - 591  Diastolic 72 - 72  Pulse 57 - 66    CONSTITUTIONAL: Well-developed and well-nourished. No acute distress.  SKIN: Skin is warm and dry. No rash noted. No cyanosis. No pallor. No jaundice HEAD: Normocephalic and atraumatic.  EYES: No scleral icterus MOUTH/THROAT: Moist oral membranes.  NECK: No JVD present. No thyromegaly noted. No carotid bruits  LYMPHATIC: No visible cervical adenopathy.  CHEST Normal respiratory effort. No intercostal retractions  LUNGS: Clear to auscultation bilaterally.  No stridor. No wheezes. No rales.  CARDIOVASCULAR: Regular rate and rhythm, positive S1-S2, no murmurs rubs or gallops appreciated. ABDOMINAL: Obese, soft, nontender, nondistended, positive bowel sounds in all 4 quadrants, no apparent ascites.  EXTREMITIES: No peripheral edema, warm to touch, 2+ dorsalis pedis pulses, patient has a brace on his both lower extremity secondary to foot drop HEMATOLOGIC: No significant bruising NEUROLOGIC: Oriented to person, place, and time. Nonfocal. Normal muscle tone.  PSYCHIATRIC: Normal mood and affect. Normal behavior. Cooperative  CARDIAC DATABASE: EKG:  12/07/2019: Normal sinus, 67 bpm, frequent PACs without underlying ischemia or injury pattern.  Echocardiogram: 12/18/2019: LVEF 55-60%, mild LVH, grade 2 diastolic impairment, mildly dilated left atrium, slightly dilated right atrium, mild MR, mild TR.  Stress Testing: Lexiscan/modified Bruce Tetrofosmin stress test 12/24/2019: Normal stress myocardial perfusion. Stress LVEF calculated 46%, although visually appears normal.   Heart catheterization: 09/28/2004 by Dr.  Terrence Dupont: Per report, LVEF of 50-55%, left main patent, LAD patent, diagonal 1 patent, diagonal 2 small patent, LCx 15 to 20% ostial stenosis, OM1 10-15% ostial stenosis, OM 2 and 3 small in caliber overall patent, RCA 10-15% mid stenosis.  72 hour Holter monitor:  Dominant rhythm sinus bradycardia, followed by sinus tachycardia.  Heart rate 45-126 bpm. Average heart rate 70 bpm.  Heart rate < 60 bpm for 50.6% of the recording.  Heart rate > 100 bpm for 15.2% of the recording.  No atrial fibrillation/atrial flutter/ventricular tachycardia/high grade AV block, sinus pause greater than or equal to 3 seconds in duration.  Total ventricular ectopic burden <0.01%.  Patient had 2 runs of supraventricular tachycardia. The longest run was 4 beats in duration at 112 bpm. The fastest run was 4 beats in duration at 118 bpm.  Total supraventricular ectopic burden <0.07% (64 premature supraventricular contractions).  Number of patient triggered events: 0.   LABORATORY DATA: CBC Latest Ref Rng & Units 09/03/2019 08/27/2011 11/01/2010  WBC 4.0 - 10.5 K/uL 5.3 5.9 9.5  Hemoglobin 13.0 - 17.0 g/dL 12.3(L) 13.9 9.7(L)  Hematocrit 39.0 - 52.0 % 40.8 42.4 31.4(L)  Platelets 150 - 400 K/uL 238 258 212    CMP Latest Ref Rng & Units 09/03/2019 09/05/2012 08/27/2011  Glucose 70 - 99 mg/dL 113(H) - 105(H)  BUN 8 - 23 mg/dL 23 26(A) 12  Creatinine 0.61 - 1.24 mg/dL 1.40(H) 1.7(A) 1.26  Sodium 135 - 145 mmol/L 139 139 139  Potassium 3.5 - 5.1 mmol/L 4.4 4.3 4.4  Chloride 98 - 111 mmol/L 103 - 101  CO2 22 - 32 mmol/L 26 - 30  Calcium 8.9 - 10.3 mg/dL 9.6 - 9.9  Total Protein 6.5 - 8.1 g/dL 7.4 - -  Total Bilirubin 0.3 - 1.2 mg/dL 0.4 - -  Alkaline Phos 38 - 126 U/L 72 88 -  AST 15 - 41 U/L 29 20 -  ALT 0 - 44 U/L 26 22 -    Lipid Panel  No results found for: CHOL, TRIG, HDL, CHOLHDL, VLDL, LDLCALC, LDLDIRECT, LABVLDL  Lab Results  Component Value Date   HGBA1C 6.3 (A) 09/05/2012   HGBA1C (H) 08/06/2008     6.7 (NOTE)   The ADA recommends the following therapeutic goal for glycemic   control related to Hgb A1C measurement:   Goal of Therapy:   < 7.0% Hgb A1C   Reference: American Diabetes Association: Clinical Practice   Recommendations 2008, Diabetes Care,  2008, 31:(Suppl 1).   No components found for: NTPROBNP No results found for: TSH  FINAL MEDICATION LIST END OF ENCOUNTER: No orders of the defined types were placed in this encounter.   There are no discontinued medications.   Current Outpatient Medications:  .  amLODipine (NORVASC) 10 MG tablet, every morning. , Disp: , Rfl:  .  aspirin EC 81 MG tablet, Take 1 tablet (81 mg total) by mouth daily., Disp: 90 tablet, Rfl: 0 .  Cholecalciferol (VITAMIN D3) 1000 UNITS CAPS, Take by mouth every morning.  , Disp: , Rfl:  .  fexofenadine (ALLEGRA) 180 MG tablet, Take 180 mg by mouth daily.  , Disp: , Rfl:  .  fluticasone (FLONASE) 50 MCG/ACT nasal spray, Place 2 sprays into both nostrils daily., Disp: , Rfl:  .  gabapentin (NEURONTIN) 300 MG capsule, Take 300 mg by mouth 3 (three) times daily., Disp: , Rfl:  .  hydrOXYzine (ATARAX/VISTARIL) 10 MG tablet, Take 20 mg by mouth at bedtime., Disp: , Rfl:  .  losartan (COZAAR) 100 MG tablet, Take 100 mg by mouth every evening., Disp: , Rfl:  .  metFORMIN (GLUCOPHAGE) 500 MG tablet, Take 500 mg by mouth 2 (two) times daily.  , Disp: , Rfl:  .  modafinil (PROVIGIL) 200 MG tablet, Take 1.5 tablets (300 mg total) by mouth daily. (Patient taking differently: Take 200 mg by mouth daily. 1.5 daily), Disp: 45 tablet, Rfl: 0 .  moxifloxacin (VIGAMOX) 0.5 % ophthalmic solution, 1 drop 3 (three) times daily., Disp: , Rfl:  .  PARoxetine (PAXIL) 40 MG tablet, Take 40 mg by mouth at bedtime.  , Disp: , Rfl:  .  polyvinyl alcohol-povidone (HYPOTEARS) 1.4-0.6 % ophthalmic solution, Place 1 drop into both eyes 3 (three) times daily. , Disp: , Rfl:  .  pravastatin (PRAVACHOL) 20 MG tablet, Take 20 mg by mouth at  bedtime. , Disp: , Rfl:  .  prednisoLONE acetate (PRED FORTE) 1 % ophthalmic suspension, 1 drop in the morning, at noon, and at bedtime., Disp: , Rfl:  .  Probiotic Product (PROBIOTIC DAILY PO), Take by mouth., Disp: , Rfl:  .  spironolactone (ALDACTONE) 25 MG tablet, Take 25 mg by mouth daily., Disp: , Rfl:  .  tamsulosin (FLOMAX) 0.4 MG CAPS capsule, Take 0.4 mg by mouth every morning., Disp: , Rfl:  .  temazepam (RESTORIL) 15 MG capsule, Take 30 mg by mouth at bedtime., Disp: , Rfl:  .  vitamin B-12 (CYANOCOBALAMIN) 500 MCG tablet, Take 500 mcg by mouth daily., Disp: , Rfl:  .  vitamin E 400 UNIT capsule, Take 400 Units by mouth daily. , Disp: , Rfl:   IMPRESSION:    ICD-10-CM   1. Dyspnea on exertion  R06.00   2. Nonobstructive atherosclerosis of coronary artery  I25.10   3. Essential hypertension  I10   4. Mixed hyperlipidemia  E78.2   5. Type 2 diabetes mellitus with diabetic neuropathy, without long-term current use of insulin (HCC)  E11.40   6. OSA on CPAP  G47.33    Z99.89   7. Class 1 obesity due to excess calories with serious comorbidity and body mass index (BMI) of 32.0 to 32.9 in adult  E66.09    Z68.32   8. Encounter to discuss test results  Z71.2      RECOMMENDATIONS: [name] is a 69 y.o. male whose past medical history and cardiac risk factors include: Nonobstructive coronary artery disease, hypertension, hyperlipidemia, non-insulin-dependent diabetes mellitus type 2, OSA on CPAP, advanced age, obesity.  Dyspnea on exertion: Chronic and Stable.   Reviewed echo and stress test results with him and his wife over the phone.   May consider increasing BP pills as his SBP are not at goal at home.  However will defer this to his provider at the [facility] hospital but has been managing them.   I am not sure exactly if he is on Aldactone as the patient did not bring his medication list and her bottles at today's office visit.  I have discussed with both the patient and his  wife that he may benefit from a diuretic therapy if not already on 1 given his symptoms of shortness of breath and elevated blood pressures.  They will discuss it with his provider at the [facility] hospital for further direction.    History of nonobstructive coronary artery disease:  Continue aspirin 81 mg p.o. daily.  Continue losartan, spironolactone, and statin therapy.  Benign essential hypertension: . Office blood pressure is not at goal.  . Home blood pressure log reviewed, his SBP ranges from 138 - 159 mmHg. [facility]  . Does daily weights; has lost 13 pounds w/ the program call "move" at [facility] hospital.  . Medication reconciled.  . Low salt diet recommended. A diet that is rich in fruits, vegetables, legumes, and low-fat dairy products and low in snacks, sweets, and meats (such as the Dietary Approaches to Stop Hypertension [DASH] diet).   Mixed hyperlipidemia: Continue statin therapy, managed by primary team.  Had the patient's wife on the speaker phone as she is very active in his healthcare. Her questions and concerns were addressed  to her satisfaction.   No orders of the defined types were placed in this encounter.  --Continue cardiac medications as reconciled in final medication list. --Return in about 6 months (around 07/19/2020) for re-evaluation of symptoms.. Or sooner if needed. --Continue follow-up with your primary care physician regarding the management of your other chronic comorbid conditions.  Patient's questions and concerns were addressed to his satisfaction. He voices understanding of the instructions provided during this encounter.   This note was created using a voice recognition software as a result there may be grammatical errors inadvertently enclosed that do not reflect the nature of this encounter. Every attempt is made to correct such errors.  [name], [place], [facility]  Pager: [phone] Office: [phone]

## 2020-01-28 ENCOUNTER — Telehealth: Payer: Self-pay

## 2020-01-30 ENCOUNTER — Ambulatory Visit (INDEPENDENT_AMBULATORY_CARE_PROVIDER_SITE_OTHER): Payer: No Typology Code available for payment source | Admitting: Nurse Practitioner

## 2020-01-30 ENCOUNTER — Encounter: Payer: Self-pay | Admitting: Nurse Practitioner

## 2020-01-30 VITALS — BP 154/62 | HR 66 | Ht 73.5 in | Wt 237.0 lb

## 2020-01-30 DIAGNOSIS — R142 Eructation: Secondary | ICD-10-CM | POA: Diagnosis not present

## 2020-01-30 DIAGNOSIS — R1310 Dysphagia, unspecified: Secondary | ICD-10-CM | POA: Diagnosis not present

## 2020-01-30 DIAGNOSIS — K5900 Constipation, unspecified: Secondary | ICD-10-CM

## 2020-01-30 MED ORDER — OMEPRAZOLE MAGNESIUM 20 MG PO TBEC: 20.0000 mg | DELAYED_RELEASE_TABLET | Freq: Every day | ORAL | 2 refills | Status: AC

## 2020-01-30 NOTE — Patient Instructions (Addendum)
If you are age 69 or older, your body mass index should be between 23-30. Your Body mass index is 30.84 kg/m. If this is out of the aforementioned range listed, please consider follow up with your Primary Care Provider.  If you are age 42 or younger, your body mass index should be between 19-25. Your Body mass index is 30.84 kg/m. If this is out of the aformentioned range listed, please consider follow up with your Primary Care Provider.   You have been scheduled for an endoscopy. Please follow written instructions given to you at your visit today. If you use inhalers (even only as needed), please bring them with you on the day of your procedure.  START taking Omeprazole 20 mg daily. This has been sent to your pharmacy.  Please call the office and schedule a follow up with Tye Savoy PA-C

## 2020-01-30 NOTE — Progress Notes (Signed)
ASSESSMENT / PLAN:  69 year old male with PMH significant for but not limited to diabetes, hyperlipidemia, hypertension, hypothyroidism, osteoporosis, sleep apnea with CPAP  # Solid food dysphagia --Esophageal sweep on modified barium swallow study August 2020 was unremarkable but symptoms persist several times a week --For further evaluation will arrange for EGD with possible dilation. The risks and benefits of EGD were discussed and the patient agrees to proceed.   # Excessive belching, chronic  --Possibly GERD related.  Omeprazole does help, he takes it as needed.  Will try omeprazole 20 mg every morning. --If EGD negative and track treated daily PPI then consider empirically treating for SIBO  # Mild microcytic anemia --noticed this on review of labs drawn in Jan 2021 around the time of left inguinal hernia repair --No overt bleeding --Await colonoscopy report from the [place] --He is scheduled for an EGD for evaluation of dysphagia, will look for etiologies of anemia at that time  # Chronic constipation --Manages well with herbal supplement  HPI:     Chief Complaint:  Dysphagia and belching.   Patient is a 69 year old male, new to the practice.  It seems the [facility] wanted him evaluated for dysphagia though I have no records.   Patient had MBSS in August 2020 at [facility] and was diagnosed with functional oropharyngeal dysphagia. From time to time he has problems swallowing solids such as mashed potatoes, salads, meats. Dysphagia hasn't improved but it hasn't progressed either.   MBSS in August 2020 -   functional oropharyngeal swallow . Aspiration risk  Was mild.  During the exam he had frequent belching and cough.  SLP.recommendations were that of solid food and thin liquids at a slow rate with small sips/bites. Esophageal sweep didn't show any problems with esophagus.  Patient's wife ( participating over the phone) gives a two year history of acid reflux. He  regurgitates fluid but doesn't have any heartburn. Takes Omeprazole as needed which is about 3-4 times a month and helps with regurgitation.   He complains of frequent postprandial belching. Omeprazole helps belching but hasn't tried to take it on a daily basis.   Last colonoscopy was in 2015 at the [place]. patient has no blood in stool.  He does give a history of constipation managed with some sort of an herbal product as needed as well as increase amount of water intake  Data Reviewed:   09/03/19 LFTs normal Hemoglobin 12.3, MCV 77   Past Medical History:  Diagnosis Date  . Allergic rhinitis   . Anxiety   . Carpal tunnel syndrome on right   . Coronary artery disease   . Depression   . Diabetes mellitus   . DJD (degenerative joint disease)   . Dysphagia   . GERD (gastroesophageal reflux disease)   . Hyperlipidemia   . Hypertension   . Hypothyroidism   . Left inguinal hernia   . Mental disorder   . Narcolepsy    takes provigil  . OSA on CPAP   . Osteoporosis   . Peripheral neuropathy   . 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 PTSD (post-traumatic stress disorder)   . Weakness of left leg   . Wears glasses      Past Surgical History:  Procedure Laterality Date  . CARDIAC CATHETERIZATION    . CERVICAL DISC SURGERY     x 2  . COLONOSCOPY    .  HIP ARTHROPLASTY     right  . INGUINAL HERNIA REPAIR Left 09/06/2019   Procedure: LAPAROSCOPIC LEFT INGUINAL HERNIA WITH MESH;  Surgeon: Greer Pickerel, MD;  Location: [facility];  Service: General;  Laterality: Left;  . JOINT REPLACEMENT    . KNEE ARTHROPLASTY     bilateral  . LUMBAR LAMINECTOMY/DECOMPRESSION MICRODISCECTOMY  08/31/2011   Procedure: LUMBAR LAMINECTOMY/DECOMPRESSION MICRODISCECTOMY;  Surgeon: Otilio Connors, MD;  Location: [facility];  Service: Neurosurgery;  Laterality: N/A;  LumbarThree to Lumbar Five Laminectomy, possible Lumbar Four-Five Diskectomy  . SHOULDER  SURGERY     left  . UPPER GI ENDOSCOPY     Family History  Problem Relation Age of Onset  . Diabetes Mother   . Cancer Brother    Social History   Tobacco Use  . Smoking status: Never Smoker  . Smokeless tobacco: Never Used  Vaping Use  . Vaping Use: Never used  Substance Use Topics  . Alcohol use: No  . Drug use: No   Current Outpatient Medications  Medication Sig Dispense Refill  . amLODipine (NORVASC) 10 MG tablet every morning.     Marland Kitchen aspirin EC 81 MG tablet Take 1 tablet (81 mg total) by mouth daily. 90 tablet 0  . Cholecalciferol (VITAMIN D3) 1000 UNITS CAPS Take by mouth every morning.      . fexofenadine (ALLEGRA) 180 MG tablet Take 180 mg by mouth daily.      . fluticasone (FLONASE) 50 MCG/ACT nasal spray Place 2 sprays into both nostrils daily.    Marland Kitchen gabapentin (NEURONTIN) 300 MG capsule Take 300 mg by mouth 3 (three) times daily.    . hydrOXYzine (ATARAX/VISTARIL) 10 MG tablet Take 20 mg by mouth at bedtime.    Marland Kitchen losartan (COZAAR) 100 MG tablet Take 100 mg by mouth every evening.    . metFORMIN (GLUCOPHAGE) 500 MG tablet Take 500 mg by mouth 2 (two) times daily.      . modafinil (PROVIGIL) 200 MG tablet Take 1.5 tablets (300 mg total) by mouth daily. (Patient taking differently: Take 200 mg by mouth daily. 1.5 daily) 45 tablet 0  . moxifloxacin (VIGAMOX) 0.5 % ophthalmic solution 1 drop 3 (three) times daily.    Marland Kitchen PARoxetine (PAXIL) 40 MG tablet Take 40 mg by mouth at bedtime.      . polyvinyl alcohol-povidone (HYPOTEARS) 1.4-0.6 % ophthalmic solution Place 1 drop into both eyes 3 (three) times daily.     . pravastatin (PRAVACHOL) 20 MG tablet Take 20 mg by mouth at bedtime.     . prednisoLONE acetate (PRED FORTE) 1 % ophthalmic suspension 1 drop in the morning, at noon, and at bedtime.    . Probiotic Product (PROBIOTIC DAILY PO) Take by mouth.    . spironolactone (ALDACTONE) 25 MG tablet Take 50 mg by mouth daily.     . tamsulosin (FLOMAX) 0.4 MG CAPS capsule Take 0.4  mg by mouth every morning.    . temazepam (RESTORIL) 15 MG capsule Take 30 mg by mouth at bedtime.    . vitamin B-12 (CYANOCOBALAMIN) 500 MCG tablet Take 500 mcg by mouth daily.    . vitamin E 400 UNIT capsule Take 400 Units by mouth daily.      No current facility-administered medications for this visit.   Allergies  Allergen Reactions  . Ace Inhibitors Swelling  . Sulfa Antibiotics Swelling  . Sulfamethoxazole Swelling     Review of Systems: Positive for allergy, sinus trouble, anxiety, arthritis, back  pain, vision changes, depression, fatigue, heart problems, itching, night sweats, shortness of breath, skin rash, swelling of feet and legs, excessive thirst, excessive urination.  All other systems reviewed and negative except where noted in HPI.   Creatinine clearance cannot be calculated (Patient's most recent lab result is older than the maximum 21 days allowed.)   Physical Exam:    Wt Readings from Last 3 Encounters:  01/30/20 237 lb (107.5 kg)  01/18/20 243 lb (110.2 kg)  12/12/19 245 lb (111.1 kg)    BP (!) 154/62   Pulse 66   Ht 6' 1.5" (1.867 m)   Wt 237 lb (107.5 kg)   BMI 30.84 kg/m  Constitutional:  Pleasant male in no acute distress. Psychiatric: Normal mood and affect. Behavior is normal. EENT: Pupils normal.  Conjunctivae are normal. No scleral icterus. Neck supple.  Cardiovascular: Normal rate, regular rhythm. Soft murmur. No edema Pulmonary/chest: Effort normal and breath sounds normal. No wheezing, rales or rhonchi. Abdominal: Soft, nondistended, nontender. Bowel sounds active throughout. There are no masses palpable. No hepatomegaly. Neurological: Alert and oriented to person place and time. Skin: Skin is warm and dry. No rashes noted.  Tye Savoy, NP  01/30/2020, 8:56 AM

## 2020-01-30 NOTE — Telephone Encounter (Signed)
Records requested from the [facility].

## 2020-01-31 NOTE — Telephone Encounter (Signed)
none

## 2020-02-06 ENCOUNTER — Ambulatory Visit (AMBULATORY_SURGERY_CENTER): Payer: No Typology Code available for payment source | Admitting: Internal Medicine

## 2020-02-06 ENCOUNTER — Other Ambulatory Visit: Payer: Self-pay

## 2020-02-06 ENCOUNTER — Encounter: Payer: Self-pay | Admitting: Internal Medicine

## 2020-02-06 VITALS — BP 129/67 | HR 53 | Temp 98.1°F | Resp 11 | Ht 73.0 in | Wt 237.0 lb

## 2020-02-06 DIAGNOSIS — K2970 Gastritis, unspecified, without bleeding: Secondary | ICD-10-CM

## 2020-02-06 DIAGNOSIS — K3189 Other diseases of stomach and duodenum: Secondary | ICD-10-CM | POA: Diagnosis not present

## 2020-02-06 DIAGNOSIS — R1319 Other dysphagia: Secondary | ICD-10-CM

## 2020-02-06 DIAGNOSIS — B3781 Candidal esophagitis: Secondary | ICD-10-CM

## 2020-02-06 DIAGNOSIS — R142 Eructation: Secondary | ICD-10-CM | POA: Diagnosis not present

## 2020-02-06 DIAGNOSIS — R1310 Dysphagia, unspecified: Secondary | ICD-10-CM | POA: Diagnosis not present

## 2020-02-06 DIAGNOSIS — K449 Diaphragmatic hernia without obstruction or gangrene: Secondary | ICD-10-CM

## 2020-02-06 DIAGNOSIS — K2950 Unspecified chronic gastritis without bleeding: Secondary | ICD-10-CM

## 2020-02-06 MED ORDER — SODIUM CHLORIDE 0.9 % IV SOLN: 500.0000 mL | Freq: Once | INTRAVENOUS | Status: DC

## 2020-02-06 NOTE — Op Note (Signed)
[facility] Patient Name: [name] Procedure Date: 02/06/2020 2:14 PM MRN: [id number] Endoscopist: Jerene Bears , MD Age: 69 Referring MD:  Date of Birth: 09-Jan-1951 Gender: Male Account #: [id number] Procedure:                Upper GI endoscopy Indications:              Dysphagia, Eructation Medicines:                Propofol per Anesthesia Procedure:                Pre-Anesthesia Assessment:                           - Prior to the procedure, a History and Physical                            was performed, and patient medications and                            allergies were reviewed. The patient's tolerance of                            previous anesthesia was also reviewed. The risks                            and benefits of the procedure and the sedation                            options and risks were discussed with the patient.                            All questions were answered, and informed consent                            was obtained. Prior Anticoagulants: The patient has                            taken no previous anticoagulant or antiplatelet                            agents. ASA Grade Assessment: III - A patient with                            severe systemic disease. After reviewing the risks                            and benefits, the patient was deemed in                            satisfactory condition to undergo the procedure.                           After obtaining informed consent, the endoscope was  passed under direct vision. Throughout the                            procedure, the patient's blood pressure, pulse, and                            oxygen saturations were monitored continuously. The                            Endoscope was introduced through the mouth, and                            advanced to the second part of duodenum. The upper                            GI endoscopy was accomplished  without difficulty.                            The patient tolerated the procedure well. Scope In: Scope Out: Findings:                 Scattered white mucosal lesions were noted in the                            middle third of the esophagus and in the lower                            third of the esophagus. Biopsies were taken with a                            cold forceps for histology. Query Candida infection.                           A 2 cm hiatal hernia was present.                           Patchy mildly erythematous mucosa was found in the                            gastric body. Biopsies were taken with a cold                            forceps for histology and Helicobacter pylori                            testing.                           The examined duodenum was normal. Complications:            No immediate complications. Estimated Blood Loss:     Estimated blood loss was minimal. Impression:               - White nummular lesions in esophageal mucosa.  Biopsied.                           - 2 cm hiatal hernia.                           - Erythematous mucosa in the gastric body. Biopsied.                           - Normal examined duodenum. Recommendation:           - Patient has a contact number available for                            emergencies. The signs and symptoms of potential                            delayed complications were discussed with the                            patient. Return to normal activities tomorrow.                            Written discharge instructions were provided to the                            patient.                           - Resume previous diet.                           - Continue present medications.                           - Await pathology results.                           - If pathology unrevealing and symptoms persist                            then please contact my office for a followup                             appointment. Could consider empiric SIBO treatment                            for belching. Jerene Bears, MD 02/06/2020 2:41:33 PM This report has been signed electronically.

## 2020-02-06 NOTE — Progress Notes (Signed)
Called to room to assist during endoscopic procedure.  Patient ID and intended procedure confirmed with present staff. Received instructions for my participation in the procedure from the performing physician.  

## 2020-02-06 NOTE — Progress Notes (Signed)
PT taken to PACU. Monitors in place. VSS. Report given to RN. 

## 2020-02-06 NOTE — Patient Instructions (Signed)
Thank you for allowing [place] to care for you today!  Await biopsy results, approximately 7-10 days.  We will contact you with results.  Resume your previous diet and medications today.  Return to your normal activities tomorrow.    YOU HAD AN ENDOSCOPIC PROCEDURE TODAY AT [facility]:   Refer to the procedure report that was given to you for any specific questions about what was found during the examination.  If the procedure report does not answer your questions, please call your gastroenterologist to clarify.  If you requested that your care partner not be given the details of your procedure findings, then the procedure report has been included in a sealed envelope for you to review at your convenience later.  YOU SHOULD EXPECT: Some feelings of bloating in the abdomen. Passage of more gas than usual.  Walking can help get rid of the air that was put into your GI tract during the procedure and reduce the bloating. If you had a lower endoscopy (such as a colonoscopy or flexible sigmoidoscopy) you may notice spotting of blood in your stool or on the toilet paper. If you underwent a bowel prep for your procedure, you may not have a normal bowel movement for a few days.  Please Note:  You might notice some irritation and congestion in your nose or some drainage.  This is from the oxygen used during your procedure.  There is no need for concern and it should clear up in a day or so.  SYMPTOMS TO REPORT IMMEDIATELY:   Following lower endoscopy (colonoscopy or flexible sigmoidoscopy):  Excessive amounts of blood in the stool  Significant tenderness or worsening of abdominal pains  Swelling of the abdomen that is new, acute  Fever of 100F or higher   Following upper endoscopy (EGD)  Vomiting of blood or coffee ground material  New chest pain or pain under the shoulder blades  Painful or persistently difficult swallowing  New shortness of breath  Fever of 100F or  higher  Black, tarry-looking stools  For urgent or emergent issues, a gastroenterologist can be reached at any hour by calling [phone]. Do not use MyChart messaging for urgent concerns.    DIET:  We do recommend a small meal at first, but then you may proceed to your regular diet.  Drink plenty of fluids but you should avoid alcoholic beverages for 24 hours.  ACTIVITY:  You should plan to take it easy for the rest of today and you should NOT DRIVE or use heavy machinery until tomorrow (because of the sedation medicines used during the test).    FOLLOW UP: Our staff will call the number listed on your records 48-72 hours following your procedure to check on you and address any questions or concerns that you may have regarding the information given to you following your procedure. If we do not reach you, we will leave a message.  We will attempt to reach you two times.  During this call, we will ask if you have developed any symptoms of COVID 19. If you develop any symptoms (ie: fever, flu-like symptoms, shortness of breath, cough etc.) before then, please call [phone].  If you test positive for Covid 19 in the 2 weeks post procedure, please call and report this information to [place].    If any biopsies were taken you will be contacted by phone or by letter within the next 1-3 weeks.  Please call us at [phone] if you have  not heard about the biopsies in 3 weeks.    SIGNATURES/CONFIDENTIALITY: You and/or your care partner have signed paperwork which will be entered into your electronic medical record.  These signatures attest to the fact that that the information above on your After Visit Summary has been reviewed and is understood.  Full responsibility of the confidentiality of this discharge information lies with you and/or your care-partner.

## 2020-02-08 ENCOUNTER — Telehealth: Payer: Self-pay

## 2020-02-08 NOTE — Telephone Encounter (Signed)
  Follow up Call-  Call back number 02/06/2020  Post procedure Call Back phone  # [phone]  Permission to leave phone message Yes  Some recent data might be hidden     Patient questions:  Do you have a fever, pain , or abdominal swelling? No. Pain Score  0 *  Have you tolerated food without any problems? Yes.    Have you been able to return to your normal activities? Yes.    Do you have any questions about your discharge instructions: Diet   No. Medications  No. Follow up visit  No.  Do you have questions or concerns about your Care? No.  Actions: * If pain score is 4 or above: No action needed, pain <4.  1. Have you developed a fever since your procedure? no  2.   Have you had an respiratory symptoms (SOB or cough) since your procedure? no  3.   Have you tested positive for COVID 19 since your procedure no  4.   Have you had any family members/close contacts diagnosed with the COVID 19 since your procedure?  no   If yes to any of these questions please route to Joylene John, RN and Erenest Rasher, RN

## 2020-02-12 ENCOUNTER — Other Ambulatory Visit: Payer: Self-pay

## 2020-02-12 MED ORDER — FLUCONAZOLE 100 MG PO TABS: ORAL_TABLET | ORAL | 0 refills | Status: DC

## 2020-02-13 NOTE — Progress Notes (Signed)
Addendum: Reviewed and agree with assessment and management plan. Donnelle Rubey M, MD  

## 2020-04-15 ENCOUNTER — Other Ambulatory Visit: Payer: Self-pay | Admitting: Internal Medicine

## 2020-04-15 ENCOUNTER — Ambulatory Visit
Admission: RE | Admit: 2020-04-15 | Discharge: 2020-04-15 | Disposition: A | Discharge status: Home / Self Care | Payer: Medicare PPO | Source: Ambulatory Visit | Attending: Internal Medicine | Admitting: Internal Medicine

## 2020-04-15 DIAGNOSIS — M79642 Pain in left hand: Secondary | ICD-10-CM

## 2020-07-21 ENCOUNTER — Ambulatory Visit: Payer: Medicare PPO | Admitting: Cardiology

## 2020-08-22 ENCOUNTER — Ambulatory Visit: Payer: Medicare PPO | Admitting: Cardiology

## 2020-08-22 ENCOUNTER — Encounter: Payer: Self-pay | Admitting: Cardiology

## 2020-08-22 ENCOUNTER — Other Ambulatory Visit: Payer: Self-pay

## 2020-08-22 VITALS — BP 154/76 | HR 69 | Ht 73.0 in | Wt 245.0 lb

## 2020-08-22 DIAGNOSIS — R0600 Dyspnea, unspecified: Secondary | ICD-10-CM

## 2020-08-22 DIAGNOSIS — E6609 Other obesity due to excess calories: Secondary | ICD-10-CM

## 2020-08-22 DIAGNOSIS — R0609 Other forms of dyspnea: Secondary | ICD-10-CM

## 2020-08-22 DIAGNOSIS — E1140 Type 2 diabetes mellitus with diabetic neuropathy, unspecified: Secondary | ICD-10-CM

## 2020-08-22 DIAGNOSIS — I2510 Atherosclerotic heart disease of native coronary artery without angina pectoris: Secondary | ICD-10-CM

## 2020-08-22 DIAGNOSIS — E782 Mixed hyperlipidemia: Secondary | ICD-10-CM

## 2020-08-22 DIAGNOSIS — G4733 Obstructive sleep apnea (adult) (pediatric): Secondary | ICD-10-CM

## 2020-08-22 DIAGNOSIS — Z6832 Body mass index (BMI) 32.0-32.9, adult: Secondary | ICD-10-CM

## 2020-08-22 DIAGNOSIS — I10 Essential (primary) hypertension: Secondary | ICD-10-CM

## 2020-08-22 NOTE — Progress Notes (Signed)
[name] Date of Birth: 01/16/1951 MRN: [id number] Primary Care Provider:Dean, Carolann Littler, MD ( also sees Dr. Daneil Dolin at [facility]).  Former Cardiology Providers: Dr. Glennon Mac  Primary Cardiologist: Rex Kras, DO, [facility] (established care 12/07/2019)  Date: 08/22/20 Last Office Visit: 01/18/2020  Chief Complaint  Patient presents with  . Dyspnea on exertion    HPI  [name] is a 70 y.o. male who presents to the office with a  chief complaint of " 1-month follow-up for reevaluation of dyspnea." His past medical history and cardiovascular risk factors are: Nonobstructive coronary artery disease, hypertension, hyperlipidemia, non-insulin-dependent diabetes mellitus type 2, OSA on CPAP, advanced age, obesity.   Patient is accompanied by his wife [name] at today's office visit.  Since last office visit patient states that he is doing well from a cardiovascular standpoint.  He denies any chest pain or anginal equivalent.  Patient states that his shortness of breath has remained stable.  He denies orthopnea, paroxysmal nocturnal dyspnea or lower extremity swelling.  No hospitalizations for congestive heart failure or chest pain since last office encounter.  Patient's blood pressure is currently managed by his other providers.  His office blood pressures are not well controlled.  Home blood pressure log also reviewed.  Medications reconciled.  Given his comorbid conditions would recommend a systolic blood pressures of around 130 mmHg if able to tolerate.  He is also gained weight since last office visit.  Patient states that he is scheduled for a repeat sleep study to reevaluate the severity of sleep apnea and other sleeping disorders that may be contributing to his symptoms.  FUNCTIONAL STATUS: He mows his lawn using a self-propelled machine, does yardwork as well.    ALLERGIES: Allergies  Allergen Reactions  . Ace Inhibitors Swelling  . Sulfa Antibiotics Swelling     MEDICATION LIST PRIOR TO VISIT: Current Outpatient Medications on File Prior to Visit  Medication Sig Dispense Refill  . amLODipine (NORVASC) 10 MG tablet every morning.     Marland Kitchen aspirin EC 81 MG tablet Take 81 mg by mouth daily. Swallow whole.    . fexofenadine (ALLEGRA) 180 MG tablet Take 180 mg by mouth daily.    . fluconazole (DIFLUCAN) 100 MG tablet Take 2 tabs by mouth on day 1 then take 1 tab daily until gone. 15 tablet 0  . fluticasone (FLONASE) 50 MCG/ACT nasal spray Place 2 sprays into both nostrils daily.    Marland Kitchen gabapentin (NEURONTIN) 300 MG capsule Take 300 mg by mouth 3 (three) times daily.    . hydrALAZINE (APRESOLINE) 25 MG tablet Take 25 mg by mouth daily.    . hydrOXYzine (ATARAX/VISTARIL) 10 MG tablet Take 20 mg by mouth at bedtime.    Marland Kitchen losartan (COZAAR) 100 MG tablet Take 100 mg by mouth every evening.    . meloxicam (MOBIC) 7.5 MG tablet Take 7.5 mg by mouth daily.    . metFORMIN (GLUCOPHAGE) 500 MG tablet Take 500 mg by mouth 2 (two) times daily.    . modafinil (PROVIGIL) 200 MG tablet Take 1.5 tablets (300 mg total) by mouth daily. (Patient taking differently: Take 200 mg by mouth daily. 1.5 daily) 45 tablet 0  . moxifloxacin (VIGAMOX) 0.5 % ophthalmic solution 1 drop 3 (three) times daily.    Marland Kitchen omeprazole (PRILOSEC OTC) 20 MG tablet Take 1 tablet (20 mg total) by mouth daily. 30 tablet 2  . PARoxetine (PAXIL) 40 MG tablet Take 40 mg by mouth at bedtime.    Marland Kitchen  polyvinyl alcohol-povidone (HYPOTEARS) 1.4-0.6 % ophthalmic solution Place 1 drop into both eyes 3 (three) times daily.    . pravastatin (PRAVACHOL) 20 MG tablet Take 20 mg by mouth at bedtime.    . prednisoLONE acetate (PRED FORTE) 1 % ophthalmic suspension 1 drop in the morning, at noon, and at bedtime.    . Probiotic Product (PROBIOTIC DAILY PO) Take by mouth.    . QUEtiapine (SEROQUEL) 25 MG tablet Take 25 mg by mouth at bedtime.    Marland Kitchen spironolactone (ALDACTONE) 25 MG tablet Take 50 mg by mouth daily.     .  tamsulosin (FLOMAX) 0.4 MG CAPS capsule Take 0.4 mg by mouth every morning.    . vitamin B-12 (CYANOCOBALAMIN) 500 MCG tablet Take 500 mcg by mouth daily.    . vitamin E 400 UNIT capsule Take 400 Units by mouth daily.    . Cholecalciferol (VITAMIN D3) 1000 UNITS CAPS Take by mouth every morning.      . temazepam (RESTORIL) 15 MG capsule Take 30 mg by mouth at bedtime. (Patient not taking: Reported on 08/22/2020)     No current facility-administered medications on file prior to visit.    PAST MEDICAL HISTORY: Past Medical History:  Diagnosis Date  . Allergic rhinitis   . Anxiety   . Carpal tunnel syndrome on right   . Coronary artery disease   . Depression   . Diabetes mellitus   . DJD (degenerative joint disease)   . Dysphagia   . GERD (gastroesophageal reflux disease)   . Hyperlipidemia   . Hypertension   . Hypothyroidism   . Left inguinal hernia   . Mental disorder   . Narcolepsy    takes provigil  . OSA on CPAP   . Osteoporosis   . Peripheral neuropathy   . 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 PTSD (post-traumatic stress disorder)   . Weakness of left leg   . Wears glasses     PAST SURGICAL HISTORY: Past Surgical History:  Procedure Laterality Date  . CARDIAC CATHETERIZATION    . CERVICAL DISC SURGERY     x 2  . COLONOSCOPY    . HIP ARTHROPLASTY     right  . INGUINAL HERNIA REPAIR Left 09/06/2019   Procedure: LAPAROSCOPIC LEFT INGUINAL HERNIA WITH MESH;  Surgeon: Greer Pickerel, MD;  Location: [facility];  Service: General;  Laterality: Left;  . JOINT REPLACEMENT    . KNEE ARTHROPLASTY     bilateral  . LUMBAR LAMINECTOMY/DECOMPRESSION MICRODISCECTOMY  08/31/2011   Procedure: LUMBAR LAMINECTOMY/DECOMPRESSION MICRODISCECTOMY;  Surgeon: Otilio Connors, MD;  Location: [facility];  Service: Neurosurgery;  Laterality: N/A;  LumbarThree to Lumbar Five Laminectomy, possible Lumbar Four-Five Diskectomy  . SHOULDER SURGERY      left  . UPPER GI ENDOSCOPY      FAMILY HISTORY: The patient family history includes Cancer in his brother; Diabetes in his mother.   SOCIAL HISTORY:  The patient  reports that he has never smoked. He has never used smokeless tobacco. He reports that he does not drink alcohol and does not use drugs.  REVIEW OF SYSTEMS: Review of Systems  Constitutional: Negative for chills and fever.  HENT: Negative for ear discharge, ear pain and nosebleeds.   Eyes: Negative for blurred vision and discharge.  Cardiovascular: Positive for dyspnea on exertion (chronic on stable) and syncope (last episode in 2018). Negative for chest pain, claudication, leg swelling, near-syncope, orthopnea, palpitations and paroxysmal  nocturnal dyspnea.  Respiratory: Positive for sleep disturbances due to breathing and snoring. Negative for cough and shortness of breath.   Endocrine: Negative for polydipsia, polyphagia and polyuria.  Hematologic/Lymphatic: Negative for bleeding problem.  Skin: Negative for flushing and nail changes.  Musculoskeletal: Negative for muscle cramps, muscle weakness and myalgias.  Gastrointestinal: Negative for abdominal pain, dysphagia, hematemesis, hematochezia, melena, nausea and vomiting.  Neurological: Positive for paresthesias. Negative for dizziness, focal weakness and light-headedness.   PHYSICAL EXAM: Vitals with BMI 08/22/2020 02/06/2020 02/06/2020  Height 6\' 1"  - -  Weight 245 lbs - -  BMI [id number] - -  Systolic [id number]  Diastolic 76 67 62  Pulse 69 53 53    CONSTITUTIONAL: Well-developed and well-nourished. No acute distress.  SKIN: Skin is warm and dry. No rash noted. No cyanosis. No pallor. No jaundice HEAD: Normocephalic and atraumatic.  EYES: No scleral icterus MOUTH/THROAT: Moist oral membranes.  NECK: No JVD present. No thyromegaly noted. No carotid bruits  LYMPHATIC: No visible cervical adenopathy.  CHEST Normal respiratory effort. No intercostal retractions   LUNGS: Clear to auscultation bilaterally.  No stridor. No wheezes. No rales.  CARDIOVASCULAR: Regular rate and rhythm, positive S1-S2, no murmurs rubs or gallops appreciated. ABDOMINAL: Obese, soft, nontender, nondistended, positive bowel sounds in all 4 quadrants, no apparent ascites.  EXTREMITIES: No peripheral edema, warm to touch, 2+ dorsalis pedis pulses, patient has a brace on his both lower extremity secondary to foot drop HEMATOLOGIC: No significant bruising NEUROLOGIC: Oriented to person, place, and time. Nonfocal. Normal muscle tone.  PSYCHIATRIC: Normal mood and affect. Normal behavior. Cooperative  CARDIAC DATABASE: EKG:  08/22/2020: Normal sinus rhythm, 69 bpm, nonspecific T wave abnormality, without underlying injury pattern.  Echocardiogram: 12/18/2019: LVEF 55-60%, mild LVH, grade 2 diastolic impairment, mildly dilated left atrium, slightly dilated right atrium, mild MR, mild TR.  Stress Testing: Lexiscan/modified Bruce Tetrofosmin stress test 12/24/2019: Normal stress myocardial perfusion. Stress LVEF calculated 46%, although visually appears normal.   Heart catheterization: 09/28/2004 by Dr. Terrence Dupont: Per report, LVEF of 50-55%, left main patent, LAD patent, diagonal 1 patent, diagonal 2 small patent, LCx 15 to 20% ostial stenosis, OM1 10-15% ostial stenosis, OM 2 and 3 small in caliber overall patent, RCA 10-15% mid stenosis.  72 hour Holter monitor:  Dominant rhythm sinus bradycardia, followed by sinus tachycardia.  Heart rate 45-126 bpm. Average heart rate 70 bpm.  Heart rate < 60 bpm for 50.6% of the recording.  Heart rate > 100 bpm for 15.2% of the recording.  No atrial fibrillation/atrial flutter/ventricular tachycardia/high grade AV block, sinus pause greater than or equal to 3 seconds in duration.  Total ventricular ectopic burden <0.01%.  Patient had 2 runs of supraventricular tachycardia. The longest run was 4 beats in duration at 112 bpm. The fastest run was 4  beats in duration at 118 bpm.  Total supraventricular ectopic burden <0.07% (64 premature supraventricular contractions).  Number of patient triggered events: 0.   LABORATORY DATA: CBC Latest Ref Rng & Units 09/03/2019 08/27/2011 11/01/2010  WBC 4.0 - 10.5 K/uL 5.3 5.9 9.5  Hemoglobin 13.0 - 17.0 g/dL 12.3(L) 13.9 9.7(L)  Hematocrit 39.0 - 52.0 % 40.8 42.4 31.4(L)  Platelets 150 - 400 K/uL 238 258 212    CMP Latest Ref Rng & Units 09/03/2019 09/05/2012 08/27/2011  Glucose 70 - 99 mg/dL 113(H) - 105(H)  BUN 8 - 23 mg/dL 23 26(A) 12  Creatinine 0.61 - 1.24 mg/dL 1.40(H) 1.7(A) 1.26  Sodium 135 - 145  mmol/L 139 139 139  Potassium 3.5 - 5.1 mmol/L 4.4 4.3 4.4  Chloride 98 - 111 mmol/L 103 - 101  CO2 22 - 32 mmol/L 26 - 30  Calcium 8.9 - 10.3 mg/dL 9.6 - 9.9  Total Protein 6.5 - 8.1 g/dL 7.4 - -  Total Bilirubin 0.3 - 1.2 mg/dL 0.4 - -  Alkaline Phos 38 - 126 U/L 72 88 -  AST 15 - 41 U/L 29 20 -  ALT 0 - 44 U/L 26 22 -   FINAL MEDICATION LIST END OF ENCOUNTER: No orders of the defined types were placed in this encounter.   There are no discontinued medications.   Current Outpatient Medications:  .  amLODipine (NORVASC) 10 MG tablet, every morning. , Disp: , Rfl:  .  aspirin EC 81 MG tablet, Take 81 mg by mouth daily. Swallow whole., Disp: , Rfl:  .  fexofenadine (ALLEGRA) 180 MG tablet, Take 180 mg by mouth daily., Disp: , Rfl:  .  fluconazole (DIFLUCAN) 100 MG tablet, Take 2 tabs by mouth on day 1 then take 1 tab daily until gone., Disp: 15 tablet, Rfl: 0 .  fluticasone (FLONASE) 50 MCG/ACT nasal spray, Place 2 sprays into both nostrils daily., Disp: , Rfl:  .  gabapentin (NEURONTIN) 300 MG capsule, Take 300 mg by mouth 3 (three) times daily., Disp: , Rfl:  .  hydrALAZINE (APRESOLINE) 25 MG tablet, Take 25 mg by mouth daily., Disp: , Rfl:  .  hydrOXYzine (ATARAX/VISTARIL) 10 MG tablet, Take 20 mg by mouth at bedtime., Disp: , Rfl:  .  losartan (COZAAR) 100 MG tablet, Take 100 mg by  mouth every evening., Disp: , Rfl:  .  meloxicam (MOBIC) 7.5 MG tablet, Take 7.5 mg by mouth daily., Disp: , Rfl:  .  metFORMIN (GLUCOPHAGE) 500 MG tablet, Take 500 mg by mouth 2 (two) times daily., Disp: , Rfl:  .  modafinil (PROVIGIL) 200 MG tablet, Take 1.5 tablets (300 mg total) by mouth daily. (Patient taking differently: Take 200 mg by mouth daily. 1.5 daily), Disp: 45 tablet, Rfl: 0 .  moxifloxacin (VIGAMOX) 0.5 % ophthalmic solution, 1 drop 3 (three) times daily., Disp: , Rfl:  .  omeprazole (PRILOSEC OTC) 20 MG tablet, Take 1 tablet (20 mg total) by mouth daily., Disp: 30 tablet, Rfl: 2 .  PARoxetine (PAXIL) 40 MG tablet, Take 40 mg by mouth at bedtime., Disp: , Rfl:  .  polyvinyl alcohol-povidone (HYPOTEARS) 1.4-0.6 % ophthalmic solution, Place 1 drop into both eyes 3 (three) times daily., Disp: , Rfl:  .  pravastatin (PRAVACHOL) 20 MG tablet, Take 20 mg by mouth at bedtime., Disp: , Rfl:  .  prednisoLONE acetate (PRED FORTE) 1 % ophthalmic suspension, 1 drop in the morning, at noon, and at bedtime., Disp: , Rfl:  .  Probiotic Product (PROBIOTIC DAILY PO), Take by mouth., Disp: , Rfl:  .  QUEtiapine (SEROQUEL) 25 MG tablet, Take 25 mg by mouth at bedtime., Disp: , Rfl:  .  spironolactone (ALDACTONE) 25 MG tablet, Take 50 mg by mouth daily. , Disp: , Rfl:  .  tamsulosin (FLOMAX) 0.4 MG CAPS capsule, Take 0.4 mg by mouth every morning., Disp: , Rfl:  .  vitamin B-12 (CYANOCOBALAMIN) 500 MCG tablet, Take 500 mcg by mouth daily., Disp: , Rfl:  .  vitamin E 400 UNIT capsule, Take 400 Units by mouth daily., Disp: , Rfl:  .  Cholecalciferol (VITAMIN D3) 1000 UNITS CAPS, Take by mouth every morning.  ,  Disp: , Rfl:  .  temazepam (RESTORIL) 15 MG capsule, Take 30 mg by mouth at bedtime. (Patient not taking: Reported on 08/22/2020), Disp: , Rfl:   IMPRESSION:    ICD-10-CM   1. Dyspnea on exertion  R06.00 EKG 12-Lead  2. Nonobstructive atherosclerosis of coronary artery  I25.10   3. Essential  hypertension  I10   4. Mixed hyperlipidemia  E78.2   5. Type 2 diabetes mellitus with diabetic neuropathy, without long-term current use of insulin (HCC)  E11.40   6. OSA on CPAP  G47.33    Z99.89   7. Class 1 obesity due to excess calories with serious comorbidity and body mass index (BMI) of 32.0 to 32.9 in adult  E66.09    Z68.32      RECOMMENDATIONS: TYAIR LOHMEYER is a 70 y.o. male whose past medical history and cardiac risk factors include: Nonobstructive coronary artery disease, hypertension, hyperlipidemia, non-insulin-dependent diabetes mellitus type 2, OSA on CPAP, advanced age, obesity.  Dyspnea on exertion: Chronic and Stable.   Symptoms are relatively stable since last office encounter.  His symptoms should improve once his blood pressure also improved.  Educated on importance of a low-salt diet.  He is undergone ischemic evaluation in the recent past including an echocardiogram and stress test.  Results were reviewed and noted above for further reference.    History of nonobstructive coronary artery disease:  Continue aspirin 81 mg p.o. daily.  Continue statin therapy.  Educated on the importance of 30 minutes a day 5 days a week of moderate intensity activity as tolerated.  Educated importance of glycemic control and blood pressure management as these are risk factors for CAD.  Benign essential hypertension: . Office blood pressure is not at goal.  . Home blood pressure log reviewed. . Recommended that he reach out to a provider that is managing his blood pressures for further medication recommendations and titration of doses.  Would recommend a goal SBP of less than or equal to 130 mmHg if able to tolerate. . Medication reconciled.  . Low salt diet recommended. A diet that is rich in fruits, vegetables, legumes, and low-fat dairy products and low in snacks, sweets, and meats (such as the Dietary Approaches to Stop Hypertension [DASH] diet).   Mixed  hyperlipidemia: Continue statin therapy, managed by primary team.  Orders Placed This Encounter  Procedures  . EKG 12-Lead   --Continue cardiac medications as reconciled in final medication list. --Return in about 6 months (around 02/19/2021) for Follow up, Dyspnea. Or sooner if needed. --Continue follow-up with your primary care physician regarding the management of your other chronic comorbid conditions.  Patient's questions and concerns were addressed to his satisfaction. He voices understanding of the instructions provided during this encounter.   This note was created using a voice recognition software as a result there may be grammatical errors inadvertently enclosed that do not reflect the nature of this encounter. Every attempt is made to correct such errors.  Rex Kras, [place], [facility]  Pager: [phone] Office: [phone]

## 2020-11-20 ENCOUNTER — Other Ambulatory Visit: Payer: Self-pay | Admitting: Internal Medicine

## 2020-11-20 ENCOUNTER — Ambulatory Visit
Admission: RE | Admit: 2020-11-20 | Discharge: 2020-11-20 | Disposition: A | Discharge status: Home / Self Care | Payer: No Typology Code available for payment source | Source: Ambulatory Visit | Attending: Internal Medicine | Admitting: Internal Medicine

## 2020-11-20 ENCOUNTER — Other Ambulatory Visit: Payer: Self-pay

## 2020-11-20 DIAGNOSIS — M542 Cervicalgia: Secondary | ICD-10-CM

## 2021-02-18 NOTE — Progress Notes (Signed)
[name] Date of Birth: 11-17-1950 MRN: [id number] Primary Care Provider:Dean, Carolann Littler, MD ( also sees Dr. Daneil Dolin at [facility]).  Former Cardiology Providers: Dr. Glennon Mac  Primary Cardiologist: Rex Kras, DO, [facility] (established care 12/07/2019)  Date: 02/19/21 Last Office Visit: August 22, 2020  Chief Complaint  Patient presents with   Dyspnea on exertion   Follow-up    6 months     HPI  [name] is a 70 y.o. male who presents to the office with a  chief complaint of " 33-month follow-up for dyspnea." His past medical history and cardiovascular risk factors are: Nonobstructive coronary artery disease, hypertension, hyperlipidemia, non-insulin-dependent diabetes mellitus type 2, OSA on CPAP, advanced age, obesity.   Patient is accompanied by his wife [name] at today's office visit.  Since last office visit patient states that his shortness of breath is improving but not completely resolved.  Patient's blood pressures in the past were as high as 170-180 mmHg.  Patient states that his home blood pressures are now around 140 mmHg.  I do not have a blood pressure log for review.  Medications reconciled.  Patient states that his blood pressure may also be secondary to nerve pain that he is experiencing and is considering undergoing nerve block.  Since last office visit he is no longer snoring as his settings for his CPAP has been uptitrated.  Patient denies any chest pain at rest or with effort related activities.  At times he states that his shortness of breath are more pronounced with effort related activities and also at times present at rest.  No hospitalizations or urgent care visits since last office encounter.  FUNCTIONAL STATUS: He mows his lawn using a self-propelled machine, does yardwork as well.    ALLERGIES: Allergies  Allergen Reactions   Ace Inhibitors Swelling   Sulfa Antibiotics Swelling    MEDICATION LIST PRIOR TO VISIT: Current Outpatient  Medications on File Prior to Visit  Medication Sig Dispense Refill   amLODipine (NORVASC) 10 MG tablet every morning.      aspirin EC 81 MG tablet Take 81 mg by mouth daily. Swallow whole.     Cholecalciferol (VITAMIN D3) 1000 UNITS CAPS Take by mouth every morning.       fexofenadine (ALLEGRA) 180 MG tablet Take 180 mg by mouth daily.     fluticasone (FLONASE) 50 MCG/ACT nasal spray Place 2 sprays into both nostrils daily.     gabapentin (NEURONTIN) 300 MG capsule Take 300 mg by mouth 3 (three) times daily.     hydrALAZINE (APRESOLINE) 50 MG tablet Take 50 mg by mouth 3 (three) times daily.     losartan (COZAAR) 100 MG tablet Take 100 mg by mouth every evening.     melatonin 3 MG TABS tablet Take 3 mg by mouth at bedtime.     meloxicam (MOBIC) 7.5 MG tablet Take 7.5 mg by mouth daily.     metFORMIN (GLUCOPHAGE) 500 MG tablet Take 500 mg by mouth 2 (two) times daily.     modafinil (PROVIGIL) 200 MG tablet Take 1.5 tablets (300 mg total) by mouth daily. (Patient taking differently: Take 200 mg by mouth daily. 1.5 daily) 45 tablet 0   omeprazole (PRILOSEC OTC) 20 MG tablet Take 1 tablet (20 mg total) by mouth daily. 30 tablet 2   PARoxetine (PAXIL) 40 MG tablet Take 40 mg by mouth at bedtime.     pravastatin (PRAVACHOL) 20 MG tablet Take 20 mg by mouth at  bedtime.     Probiotic Product (PROBIOTIC DAILY PO) Take by mouth.     QUEtiapine (SEROQUEL) 25 MG tablet Take 25 mg by mouth at bedtime.     spironolactone (ALDACTONE) 25 MG tablet Take 50 mg by mouth daily.      tamsulosin (FLOMAX) 0.4 MG CAPS capsule Take 0.4 mg by mouth every morning.     triamcinolone ointment (KENALOG) 0.1 % Apply 1 application topically 2 (two) times daily.     vitamin B-12 (CYANOCOBALAMIN) 500 MCG tablet Take 500 mcg by mouth daily.     vitamin E 400 UNIT capsule Take 400 Units by mouth daily.     No current facility-administered medications on file prior to visit.    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;  Service: General;  Laterality: Left;   JOINT REPLACEMENT     KNEE ARTHROPLASTY     bilateral   LUMBAR LAMINECTOMY/DECOMPRESSION MICRODISCECTOMY  08/31/2011   Procedure: LUMBAR LAMINECTOMY/DECOMPRESSION MICRODISCECTOMY;  Surgeon: Otilio Connors, MD;  Location: [place] [facility];  Service: Neurosurgery;  Laterality: N/A;  LumbarThree to Lumbar Five Laminectomy, possible Lumbar Four-Five Diskectomy   SHOULDER SURGERY     left   UPPER GI ENDOSCOPY      FAMILY HISTORY: The patient family history includes Cancer in his brother; Diabetes in his mother.   SOCIAL HISTORY:  The patient  reports that he has never smoked. He has never used smokeless tobacco. He reports that he does not drink alcohol and does not use drugs.  REVIEW OF SYSTEMS: Review of Systems  Constitutional: Negative for chills and fever.  HENT:  Negative for ear discharge, ear pain and nosebleeds.   Eyes:  Negative for blurred vision and discharge.  Cardiovascular:  Positive for dyspnea on exertion  (chronic on stable). Negative for chest pain, claudication, leg swelling, near-syncope, orthopnea, palpitations, paroxysmal nocturnal dyspnea and syncope.  Respiratory:  Negative for cough, shortness of breath, sleep disturbances due to breathing and snoring.   Endocrine: Negative for polydipsia, polyphagia and polyuria.  Hematologic/Lymphatic: Negative for bleeding problem.  Skin:  Negative for flushing and nail changes.  Musculoskeletal:  Negative for muscle cramps, muscle weakness and myalgias.  Gastrointestinal:  Negative for abdominal pain, dysphagia, hematemesis, hematochezia, melena, nausea and vomiting.  Neurological:  Positive for paresthesias. Negative for dizziness, focal weakness  and light-headedness.  PHYSICAL EXAM: Vitals with BMI 02/19/2021 02/19/2021 08/22/2020  Height - 6\' 1"  6\' 1"   Weight - 245 lbs 245 lbs  BMI - 75.17 00.17  Systolic 494 496 759  Diastolic 74 74 76  Pulse 68 66 69    CONSTITUTIONAL: Well-developed and well-nourished. No acute distress.  SKIN: Skin is warm and dry. No rash noted. No cyanosis. No pallor. No jaundice HEAD: Normocephalic and atraumatic.  EYES: No scleral icterus MOUTH/THROAT: Moist oral membranes.  NECK: No JVD present. No thyromegaly noted. No carotid bruits  LYMPHATIC: No visible cervical adenopathy.  CHEST Normal respiratory effort. No intercostal retractions  LUNGS: Clear to auscultation bilaterally.  No stridor. No wheezes. No rales.  CARDIOVASCULAR: Regular rate and rhythm, positive S1-S2, no murmurs rubs or gallops appreciated. ABDOMINAL: Obese, soft, nontender, nondistended, positive bowel sounds in all 4 quadrants, no apparent ascites.  EXTREMITIES: No peripheral edema, warm to touch, 2+ dorsalis pedis pulses, patient has a brace on his both lower extremity secondary to foot drop HEMATOLOGIC: No significant bruising NEUROLOGIC: Oriented to person, place, and time. Nonfocal. Normal muscle tone.  PSYCHIATRIC: Normal mood and affect.  Normal behavior. Cooperative  CARDIAC DATABASE: EKG:  08/22/2020: Normal sinus rhythm, 69 bpm, nonspecific T wave abnormality, without underlying injury pattern.  Echocardiogram: 12/18/2019: LVEF 55-60%, mild LVH, grade 2 diastolic impairment, mildly dilated left atrium, slightly dilated right atrium, mild MR, mild TR.  Stress Testing: Lexiscan/modified Bruce Tetrofosmin stress test 12/24/2019: Normal stress myocardial perfusion. Stress LVEF calculated 46%, although visually appears normal.   Heart catheterization: 09/28/2004 by Dr. Terrence Dupont: Per report, LVEF of 50-55%, left main patent, LAD patent, diagonal 1 patent, diagonal 2 small patent, LCx 15 to 20% ostial stenosis, OM1 10-15% ostial stenosis, OM 2 and 3 small in caliber overall patent, RCA 10-15% mid stenosis.  72 hour Holter monitor:  Dominant rhythm sinus bradycardia, followed by sinus tachycardia.  Heart rate 45-126 bpm.  Average heart rate 70 bpm.  Heart rate < 60 bpm for 50.6% of the recording.  Heart rate > 100 bpm for 15.2% of the recording.  No atrial fibrillation/atrial flutter/ventricular tachycardia/high grade AV block, sinus pause greater than or equal to 3 seconds in duration.  Total ventricular ectopic burden <0.01%.  Patient had 2 runs of supraventricular tachycardia.  The longest run was 4 beats in duration at 112 bpm.  The fastest run was 4 beats in duration at 118 bpm.  Total supraventricular ectopic burden <0.07% (64 premature supraventricular contractions).  Number of patient triggered events: 0.   LABORATORY DATA: CBC Latest Ref Rng & Units 09/03/2019 08/27/2011 11/01/2010  WBC 4.0 - 10.5 K/uL 5.3 5.9 9.5  Hemoglobin 13.0 - 17.0 g/dL 12.3(L) 13.9 9.7(L)  Hematocrit 39.0 - 52.0 % 40.8 42.4 31.4(L)  Platelets 150 - 400 K/uL 238 258 212    CMP Latest Ref Rng & Units 09/03/2019 09/05/2012 08/27/2011  Glucose 70 - 99 mg/dL 113(H) - 105(H)  BUN 8 - 23 mg/dL 23 26(A) 12  Creatinine 0.61 - 1.24 mg/dL 1.40(H) 1.7(A) 1.26   Sodium 135 - 145 mmol/L 139 139 139  Potassium 3.5 - 5.1 mmol/L 4.4 4.3 4.4  Chloride 98 - 111 mmol/L 103 - 101  CO2 22 - 32 mmol/L 26 - 30  Calcium 8.9 - 10.3 mg/dL 9.6 - 9.9  Total Protein 6.5 - 8.1 g/dL 7.4 - -  Total Bilirubin 0.3 - 1.2 mg/dL 0.4 - -  Alkaline Phos 38 - 126 U/L 72 88 -  AST  15 - 41 U/L 29 20 -  ALT 0 - 44 U/L 26 22 -   FINAL MEDICATION LIST END OF ENCOUNTER: No orders of the defined types were placed in this encounter.   Medications Discontinued During This Encounter  Medication Reason   fluconazole (DIFLUCAN) 100 MG tablet Error   hydrOXYzine (ATARAX/VISTARIL) 10 MG tablet Error   moxifloxacin (VIGAMOX) 0.5 % ophthalmic solution Error   polyvinyl alcohol-povidone (HYPOTEARS) 1.4-0.6 % ophthalmic solution Error   prednisoLONE acetate (PRED FORTE) 1 % ophthalmic suspension Error   temazepam (RESTORIL) 15 MG capsule Error     Current Outpatient Medications:    amLODipine (NORVASC) 10 MG tablet, every morning. , Disp: , Rfl:    aspirin EC 81 MG tablet, Take 81 mg by mouth daily. Swallow whole., Disp: , Rfl:    Cholecalciferol (VITAMIN D3) 1000 UNITS CAPS, Take by mouth every morning.  , Disp: , Rfl:    fexofenadine (ALLEGRA) 180 MG tablet, Take 180 mg by mouth daily., Disp: , Rfl:    fluticasone (FLONASE) 50 MCG/ACT nasal spray, Place 2 sprays into both nostrils daily., Disp: , Rfl:    gabapentin (NEURONTIN) 300 MG capsule, Take 300 mg by mouth 3 (three) times daily., Disp: , Rfl:    hydrALAZINE (APRESOLINE) 50 MG tablet, Take 50 mg by mouth 3 (three) times daily., Disp: , Rfl:    losartan (COZAAR) 100 MG tablet, Take 100 mg by mouth every evening., Disp: , Rfl:    melatonin 3 MG TABS tablet, Take 3 mg by mouth at bedtime., Disp: , Rfl:    meloxicam (MOBIC) 7.5 MG tablet, Take 7.5 mg by mouth daily., Disp: , Rfl:    metFORMIN (GLUCOPHAGE) 500 MG tablet, Take 500 mg by mouth 2 (two) times daily., Disp: , Rfl:    modafinil (PROVIGIL) 200 MG tablet, Take 1.5  tablets (300 mg total) by mouth daily. (Patient taking differently: Take 200 mg by mouth daily. 1.5 daily), Disp: 45 tablet, Rfl: 0   omeprazole (PRILOSEC OTC) 20 MG tablet, Take 1 tablet (20 mg total) by mouth daily., Disp: 30 tablet, Rfl: 2   PARoxetine (PAXIL) 40 MG tablet, Take 40 mg by mouth at bedtime., Disp: , Rfl:    pravastatin (PRAVACHOL) 20 MG tablet, Take 20 mg by mouth at bedtime., Disp: , Rfl:    Probiotic Product (PROBIOTIC DAILY PO), Take by mouth., Disp: , Rfl:    QUEtiapine (SEROQUEL) 25 MG tablet, Take 25 mg by mouth at bedtime., Disp: , Rfl:    spironolactone (ALDACTONE) 25 MG tablet, Take 50 mg by mouth daily. , Disp: , Rfl:    tamsulosin (FLOMAX) 0.4 MG CAPS capsule, Take 0.4 mg by mouth every morning., Disp: , Rfl:    triamcinolone ointment (KENALOG) 0.1 %, Apply 1 application topically 2 (two) times daily., Disp: , Rfl:    vitamin B-12 (CYANOCOBALAMIN) 500 MCG tablet, Take 500 mcg by mouth daily., Disp: , Rfl:    vitamin E 400 UNIT capsule, Take 400 Units by mouth daily., Disp: , Rfl:   IMPRESSION:    ICD-10-CM   1. Dyspnea on exertion  R06.00 EKG 12-Lead    2. Nonobstructive atherosclerosis of coronary artery  I25.10     3. Essential hypertension  I10     4. Mixed hyperlipidemia  E78.2     5. Type 2 diabetes mellitus with diabetic neuropathy, without long-term current use of insulin (HCC)  E11.40     6. OSA on CPAP  G47.33  Z99.89     7. Class 1 obesity due to excess calories with serious comorbidity and body mass index (BMI) of 32.0 to 32.9 in adult  E66.09    Z68.32        RECOMMENDATIONS: SPARSH CALLENS is a 70 y.o. male whose past medical history and cardiac risk factors include: Nonobstructive coronary artery disease, hypertension, hyperlipidemia, non-insulin-dependent diabetes mellitus type 2, OSA on CPAP, advanced age, obesity.  Dyspnea on exertion: Chronic Overall stable but at times more noticeable with house chores. In the past patient has  undergone both an echocardiogram and stress test.  In 2006 he had a left heart catheterization which noted nonobstructive CAD.  If his shortness of breath continues despite optimizing his antihypertensive medications we discussed considering undergoing left heart catheterization to reevaluate his coronary anatomy and disease burden.  They would like to hold off on this at the current time and reconsider this in 3 months based on his symptoms.  History of nonobstructive coronary artery disease: Continue aspirin 81 mg p.o. daily. Continue statin therapy. Educated on the importance of 30 minutes a day 5 days a week of moderate intensity activity as tolerated. Educated importance of glycemic control and blood pressure management as these are risk factors for CAD.  Benign essential hypertension: Office blood pressure is not at goal.  Home blood pressure around 148mmHg, per patient.  Would recommend a goal SBP of less than or equal to 130 mmHg if able to tolerate. Recommended transitioning his hydralazine to BiDil.  They would like to discuss this further with Dr. Daneil Dolin at the [facility] who is currently managing his antihypertensive medications. Low salt diet recommended. A diet that is rich in fruits, vegetables, legumes, and low-fat dairy products and low in snacks, sweets, and meats (such as the Dietary Approaches to Stop Hypertension [DASH] diet).   Mixed hyperlipidemia: Continue statin therapy, managed by primary team.  Orders Placed This Encounter  Procedures   EKG 12-Lead   --Continue cardiac medications as reconciled in final medication list. --Return in about 3 months (around 05/22/2021) for Follow up, Dyspnea. Or sooner if needed. --Continue follow-up with your primary care physician regarding the management of your other chronic comorbid conditions.  Patient's questions and concerns were addressed to his satisfaction. He voices understanding of the instructions provided during this encounter.    This note was created using a voice recognition software as a result there may be grammatical errors inadvertently enclosed that do not reflect the nature of this encounter. Every attempt is made to correct such errors.  Rex Kras, [place], [facility]  Pager: [phone] Office: [phone]

## 2021-02-19 ENCOUNTER — Other Ambulatory Visit: Payer: Self-pay

## 2021-02-19 ENCOUNTER — Ambulatory Visit: Payer: Medicare PPO | Admitting: Cardiology

## 2021-02-19 ENCOUNTER — Encounter: Payer: Self-pay | Admitting: Cardiology

## 2021-02-19 VITALS — BP 156/74 | HR 68 | Temp 98.3°F | Resp 16 | Ht 73.0 in | Wt 245.0 lb

## 2021-02-19 DIAGNOSIS — G4733 Obstructive sleep apnea (adult) (pediatric): Secondary | ICD-10-CM

## 2021-02-19 DIAGNOSIS — I2510 Atherosclerotic heart disease of native coronary artery without angina pectoris: Secondary | ICD-10-CM

## 2021-02-19 DIAGNOSIS — E6609 Other obesity due to excess calories: Secondary | ICD-10-CM

## 2021-02-19 DIAGNOSIS — E782 Mixed hyperlipidemia: Secondary | ICD-10-CM

## 2021-02-19 DIAGNOSIS — R0609 Other forms of dyspnea: Secondary | ICD-10-CM

## 2021-02-19 DIAGNOSIS — E1140 Type 2 diabetes mellitus with diabetic neuropathy, unspecified: Secondary | ICD-10-CM

## 2021-02-19 DIAGNOSIS — I10 Essential (primary) hypertension: Secondary | ICD-10-CM

## 2021-05-22 ENCOUNTER — Ambulatory Visit: Payer: Medicare PPO | Admitting: Cardiology

## 2021-05-22 ENCOUNTER — Encounter: Payer: Self-pay | Admitting: Cardiology

## 2021-05-22 ENCOUNTER — Other Ambulatory Visit: Payer: Self-pay

## 2021-05-22 VITALS — BP 137/69 | HR 77 | Temp 98.5°F | Resp 17 | Ht 73.0 in | Wt 236.0 lb

## 2021-05-22 DIAGNOSIS — E1140 Type 2 diabetes mellitus with diabetic neuropathy, unspecified: Secondary | ICD-10-CM

## 2021-05-22 DIAGNOSIS — R9431 Abnormal electrocardiogram [ECG] [EKG]: Secondary | ICD-10-CM

## 2021-05-22 DIAGNOSIS — I10 Essential (primary) hypertension: Secondary | ICD-10-CM

## 2021-05-22 DIAGNOSIS — Z9989 Dependence on other enabling machines and devices: Secondary | ICD-10-CM

## 2021-05-22 DIAGNOSIS — I2510 Atherosclerotic heart disease of native coronary artery without angina pectoris: Secondary | ICD-10-CM

## 2021-05-22 DIAGNOSIS — R0609 Other forms of dyspnea: Secondary | ICD-10-CM

## 2021-05-22 DIAGNOSIS — E782 Mixed hyperlipidemia: Secondary | ICD-10-CM

## 2021-05-22 DIAGNOSIS — E6609 Other obesity due to excess calories: Secondary | ICD-10-CM

## 2021-05-22 DIAGNOSIS — G4733 Obstructive sleep apnea (adult) (pediatric): Secondary | ICD-10-CM

## 2021-05-22 MED ORDER — METOPROLOL TARTRATE 25 MG PO TABS: 25.0000 mg | ORAL_TABLET | Freq: Two times a day (BID) | ORAL | 0 refills | Status: DC

## 2021-05-22 NOTE — Progress Notes (Signed)
[name] Date of Birth: August 02, 1951 MRN: [id number] Primary Care Provider:Dean, Carolann Littler, MD ( also sees Dr. Daneil Dolin at [facility]).  Former Cardiology Providers: Dr. Glennon Mac  Primary Cardiologist: Rex Kras, DO, [facility] (established care 12/07/2019)  Date: 05/22/21 Last Office Visit: 02/19/2021  Chief Complaint  Patient presents with   Follow-up    3 MONTHS   Shortness of Breath    HPI  [name] is a 70 y.o. male who presents to the office with a  chief complaint of " 3 months follow up for shortness of breath." His past medical history and cardiovascular risk factors are: Nonobstructive coronary artery disease, hypertension, hyperlipidemia, non-insulin-dependent diabetes mellitus type 2, OSA on CPAP, advanced age, obesity.   Patient is accompanied by his wife [name] at today's office visit.  Patient presents today for 41-month follow-up visit to reevaluate his shortness of breath.  Patient states that since last office visit his symptoms have gotten progressively worse.  Majority of the time the symptoms are brought on by effort related activities which resolved with rest.  At times he also notes symptoms at rest as well.  No associated chest pain or anginal discomfort.  In the past patient's systolic blood pressures would range between 170-180 mmHg.  Patient has been working with his primary care provider at the [place] Dr. Daneil Dolin and patient's home systolic blood pressures are now between 130-160 mmHg.  Patient's wife is concerned that his shortness of breath at times can be worse enough that she feels like "she will lose him."   Patient also complains of lightheaded and dizziness with overhead activities.  He is compliant with the CPAP use.  No hospitalizations or urgent care visits for cardiovascular symptoms since last office encounter.  FUNCTIONAL STATUS: He mows his lawn using a self-propelled machine, does yardwork as well.    ALLERGIES: Allergies  Allergen  Reactions   Ace Inhibitors Swelling   Sulfa Antibiotics Swelling    MEDICATION LIST PRIOR TO VISIT: Current Outpatient Medications on File Prior to Visit  Medication Sig Dispense Refill   amLODipine (NORVASC) 10 MG tablet every morning.      aspirin EC 81 MG tablet Take 81 mg by mouth daily. Swallow whole.     b complex vitamins capsule Take 1 capsule by mouth daily.     carbamide peroxide (DEBROX) 6.5 % OTIC solution Place 5 drops into both ears as needed.     Cholecalciferol (VITAMIN D3) 1000 UNITS CAPS Take by mouth every morning.       fexofenadine (ALLEGRA) 180 MG tablet Take 180 mg by mouth daily.     fluticasone (FLONASE) 50 MCG/ACT nasal spray Place 2 sprays into both nostrils daily.     gabapentin (NEURONTIN) 300 MG capsule Take 300 mg by mouth 3 (three) times daily.     hydrALAZINE (APRESOLINE) 50 MG tablet Take 50 mg by mouth 3 (three) times daily.     losartan (COZAAR) 100 MG tablet Take 100 mg by mouth every evening.     melatonin 3 MG TABS tablet Take 3 mg by mouth at bedtime.     meloxicam (MOBIC) 7.5 MG tablet Take 7.5 mg by mouth daily.     metFORMIN (GLUCOPHAGE) 500 MG tablet Take 500 mg by mouth 2 (two) times daily.     modafinil (PROVIGIL) 200 MG tablet Take 1.5 tablets (300 mg total) by mouth daily. (Patient taking differently: Take 200 mg by mouth daily. 1.5 daily) 45 tablet 0  omeprazole (PRILOSEC OTC) 20 MG tablet Take 1 tablet (20 mg total) by mouth daily. 30 tablet 2   PARoxetine (PAXIL) 40 MG tablet Take 40 mg by mouth at bedtime.     pravastatin (PRAVACHOL) 20 MG tablet Take 20 mg by mouth at bedtime.     Probiotic Product (PROBIOTIC DAILY PO) Take by mouth.     QUEtiapine (SEROQUEL) 25 MG tablet Take 25 mg by mouth at bedtime.     spironolactone (ALDACTONE) 25 MG tablet Take 50 mg by mouth daily.      tamsulosin (FLOMAX) 0.4 MG CAPS capsule Take 0.4 mg by mouth every morning.     triamcinolone ointment (KENALOG) 0.1 % Apply 1 application topically as needed.      Turmeric 400 MG CAPS Take 1 capsule by mouth daily.     vitamin E 400 UNIT capsule Take 400 Units by mouth daily.     No current facility-administered medications on file prior to visit.    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   PTSD (post-traumatic stress disorder)    Weakness of left leg    Wears glasses     PAST SURGICAL HISTORY: Past Surgical History:  Procedure Laterality Date   CARDIAC CATHETERIZATION     CERVICAL DISC SURGERY     x 2   COLONOSCOPY     HIP ARTHROPLASTY     right   INGUINAL HERNIA REPAIR Left 09/06/2019   Procedure: LAPAROSCOPIC LEFT INGUINAL HERNIA WITH MESH;  Surgeon: Greer Pickerel, MD;  Location: [facility];  Service: General;  Laterality: Left;   JOINT REPLACEMENT     KNEE ARTHROPLASTY     bilateral   LUMBAR LAMINECTOMY/DECOMPRESSION MICRODISCECTOMY  08/31/2011   Procedure: LUMBAR LAMINECTOMY/DECOMPRESSION MICRODISCECTOMY;  Surgeon: Otilio Connors, MD;  Location: [facility];  Service: Neurosurgery;  Laterality: N/A;  LumbarThree to Lumbar Five Laminectomy, possible Lumbar Four-Five Diskectomy   SHOULDER SURGERY     left   UPPER GI ENDOSCOPY      FAMILY HISTORY: The patient family history includes Cancer in his brother; Diabetes in his mother; Hypertension in his brother and brother.   SOCIAL HISTORY:  The patient  reports that he has never smoked. He has never used smokeless tobacco. He reports that he does not drink alcohol  and does not use drugs.  REVIEW OF SYSTEMS: Review of Systems  Constitutional: Negative for chills and fever.  HENT:  Negative for ear discharge, ear pain and nosebleeds.   Eyes:  Negative for blurred vision and discharge.  Cardiovascular:  Positive for dyspnea on exertion (chronic on stable). Negative for chest pain, claudication, leg swelling, near-syncope, orthopnea, palpitations, paroxysmal nocturnal dyspnea and syncope.  Respiratory:  Positive for shortness of breath. Negative for cough, sleep disturbances due to breathing and snoring.   Endocrine: Negative for polydipsia, polyphagia and polyuria.  Hematologic/Lymphatic: Negative for bleeding problem.  Skin:  Negative for flushing and nail changes.  Musculoskeletal:  Negative for muscle cramps, muscle weakness and myalgias.  Gastrointestinal:  Negative for abdominal pain, dysphagia, hematemesis, hematochezia, melena, nausea and vomiting.  Neurological:  Positive for light-headedness. Negative for dizziness, focal weakness and paresthesias.   PHYSICAL EXAM: Vitals with BMI 05/22/2021 05/22/2021 02/19/2021  Height - 6\' 1"  -  Weight - 236 lbs -  BMI - 73.71 -  Systolic [phone]  Diastolic 69 64 74  Pulse 77 79 68    CONSTITUTIONAL: Well-developed and well-nourished. No acute distress.  SKIN: Skin is warm and dry. No rash noted. No cyanosis. No pallor. No jaundice HEAD: Normocephalic and atraumatic.  EYES: No scleral icterus MOUTH/THROAT: Moist oral membranes.  NECK: No JVD present. No thyromegaly noted. No carotid bruits  LYMPHATIC: No visible cervical adenopathy.  CHEST Normal respiratory effort. No intercostal retractions  LUNGS: Clear to auscultation bilaterally.  No stridor. No wheezes. No rales.  CARDIOVASCULAR: Regular rate and rhythm, positive S1-S2, no murmurs rubs or gallops appreciated. ABDOMINAL: Obese, soft, nontender, nondistended, positive bowel sounds in all 4 quadrants, no apparent ascites.  EXTREMITIES: No  peripheral edema, warm to touch, 2+ dorsalis pedis pulses, patient has a brace on his both lower extremity secondary to foot drop HEMATOLOGIC: No significant bruising NEUROLOGIC: Oriented to person, place, and time. Nonfocal. Normal muscle tone.  PSYCHIATRIC: Normal mood and affect. Normal behavior. Cooperative  CARDIAC DATABASE: EKG:  08/22/2020: Normal sinus rhythm, 69 bpm, nonspecific T wave abnormality, without underlying injury pattern.  Echocardiogram: 12/18/2019: LVEF 55-60%, mild LVH, grade 2 diastolic impairment, mildly dilated left atrium, slightly dilated right atrium, mild MR, mild TR.  Stress Testing: Lexiscan/modified Bruce Tetrofosmin stress test 12/24/2019: Normal stress myocardial perfusion. Stress LVEF calculated 46%, although visually appears normal.   Heart catheterization: 09/28/2004 by Dr. Terrence Dupont: Per report, LVEF of 50-55%, left main patent, LAD patent, diagonal 1 patent, diagonal 2 small patent, LCx 15 to 20% ostial stenosis, OM1 10-15% ostial stenosis, OM 2 and 3 small in caliber overall patent, RCA 10-15% mid stenosis.  72 hour Holter monitor:  Dominant rhythm sinus bradycardia, followed by sinus tachycardia.  Heart rate 45-126 bpm.  Average heart rate 70 bpm.  Heart rate < 60 bpm for 50.6% of the recording.  Heart rate > 100 bpm for 15.2% of the recording.  No atrial fibrillation/atrial flutter/ventricular tachycardia/high grade AV block, sinus pause greater than or equal to 3 seconds in duration.  Total ventricular ectopic burden <0.01%.  Patient had 2 runs of supraventricular tachycardia.  The longest run was 4 beats in duration at 112 bpm.  The fastest run was 4 beats in duration at 118 bpm.  Total supraventricular ectopic burden <0.07% (64 premature supraventricular contractions).  Number of patient triggered events: 0.   LABORATORY DATA: CBC Latest Ref Rng & Units 09/03/2019 08/27/2011 11/01/2010  WBC 4.0 - 10.5 K/uL 5.3 5.9 9.5  Hemoglobin 13.0 - 17.0 g/dL  12.3(L) 13.9 9.7(L)  Hematocrit 39.0 - 52.0 % 40.8 42.4 31.4(L)  Platelets 150 - 400 K/uL 238 258 212    CMP Latest Ref Rng & Units 09/03/2019 09/05/2012 08/27/2011  Glucose 70 - 99 mg/dL 113(H) - 105(H)  BUN 8 - 23 mg/dL 23 26(A) 12  Creatinine 0.61 - 1.24 mg/dL 1.40(H) 1.7(A) 1.26  Sodium 135 - 145 mmol/L 139 139 139  Potassium 3.5 - 5.1 mmol/L 4.4 4.3 4.4  Chloride 98 - 111 mmol/L 103 - 101  CO2 22 - 32 mmol/L 26 - 30  Calcium 8.9 - 10.3 mg/dL 9.6 - 9.9  Total Protein 6.5 - 8.1 g/dL 7.4 - -  Total Bilirubin 0.3 - 1.2 mg/dL 0.4 - -  Alkaline Phos 38 - 126 U/L 72 88 -  AST 15 - 41 U/L 29 20 -  ALT 0 - 44 U/L 26 22 -   FINAL MEDICATION LIST END OF ENCOUNTER: Meds ordered this encounter  Medications   metoprolol tartrate (LOPRESSOR) 25 MG tablet    Sig: Take 1 tablet (25 mg total) by mouth 2 (two) times daily.    Dispense:  60 tablet    Refill:  0     Medications Discontinued During This Encounter  Medication Reason   vitamin B-12 (CYANOCOBALAMIN) 500 MCG tablet Error     Current Outpatient Medications:    amLODipine (NORVASC) 10 MG tablet, every morning. , Disp: , Rfl:    aspirin EC 81 MG tablet, Take 81 mg by mouth daily. Swallow whole., Disp: , Rfl:    b complex vitamins capsule, Take 1 capsule by mouth daily., Disp: , Rfl:    carbamide peroxide (DEBROX) 6.5 % OTIC solution, Place 5 drops into both ears as needed., Disp: , Rfl:    Cholecalciferol (VITAMIN D3) 1000 UNITS CAPS, Take by mouth every morning.  , Disp: , Rfl:    fexofenadine (ALLEGRA) 180 MG tablet, Take 180 mg by mouth daily., Disp: , Rfl:    fluticasone (FLONASE) 50 MCG/ACT nasal spray, Place 2 sprays into both nostrils daily., Disp: , Rfl:    gabapentin (NEURONTIN) 300 MG capsule, Take 300 mg by mouth 3 (three) times daily., Disp: , Rfl:    hydrALAZINE (APRESOLINE) 50 MG tablet, Take 50 mg by mouth 3 (three) times daily., Disp: , Rfl:    losartan (COZAAR) 100 MG tablet, Take 100 mg by mouth every evening.,  Disp: , Rfl:    melatonin 3 MG TABS tablet, Take 3 mg by mouth at bedtime., Disp: , Rfl:    meloxicam (MOBIC) 7.5 MG tablet, Take 7.5 mg by mouth daily., Disp: , Rfl:    metFORMIN (GLUCOPHAGE) 500 MG tablet, Take 500 mg by mouth 2 (two) times daily., Disp: , Rfl:    metoprolol tartrate (LOPRESSOR) 25 MG tablet, Take 1 tablet (25 mg total) by mouth 2 (two) times daily., Disp: 60 tablet, Rfl: 0   modafinil (PROVIGIL) 200 MG tablet, Take 1.5 tablets (300 mg total) by mouth daily. (Patient taking differently: Take 200 mg by mouth daily. 1.5 daily), Disp: 45 tablet, Rfl: 0   omeprazole (PRILOSEC OTC) 20 MG tablet, Take 1 tablet (20 mg total) by mouth daily., Disp: 30 tablet, Rfl: 2   PARoxetine (PAXIL) 40 MG tablet, Take 40 mg by mouth at bedtime., Disp: , Rfl:    pravastatin (PRAVACHOL) 20 MG tablet, Take 20 mg by mouth at bedtime., Disp: , Rfl:    Probiotic Product (PROBIOTIC DAILY PO), Take by mouth., Disp: , Rfl:    QUEtiapine (SEROQUEL) 25 MG tablet, Take 25 mg by mouth at bedtime., Disp: , Rfl:    spironolactone (ALDACTONE) 25 MG tablet, Take 50 mg by mouth daily. , Disp: , Rfl:    tamsulosin (FLOMAX) 0.4 MG CAPS capsule, Take 0.4 mg by mouth every morning., Disp: , Rfl:    triamcinolone ointment (KENALOG) 0.1 %, Apply 1 application topically as needed., Disp: , Rfl:    Turmeric 400 MG CAPS, Take 1 capsule by mouth daily., Disp: , Rfl:  vitamin E 400 UNIT capsule, Take 400 Units by mouth daily., Disp: , Rfl:   IMPRESSION:    ICD-10-CM   1. Dyspnea on exertion  R06.09 CT CORONARY MORPH W/CTA COR W/SCORE W/CA W/CM &/OR WO/CM    metoprolol tartrate (LOPRESSOR) 25 MG tablet    Basic metabolic panel    2. Nonspecific abnormal electrocardiogram (ECG) (EKG)  R94.31 CT CORONARY MORPH W/CTA COR W/SCORE W/CA W/CM &/OR WO/CM    3. Nonobstructive atherosclerosis of coronary artery  I25.10     4. Essential hypertension  I10     5. Mixed hyperlipidemia  E78.2     6. Type 2 diabetes mellitus with  diabetic neuropathy, without long-term current use of insulin (HCC)  E11.40     7. OSA on CPAP  G47.33    Z99.89     8. Class 1 obesity due to excess calories with serious comorbidity and body mass index (BMI) of 31.0 to 31.9 in adult  E66.09    Z68.31        RECOMMENDATIONS: LEVONE OTTEN is a 70 y.o. male whose past medical history and cardiac risk factors include: Nonobstructive coronary artery disease, hypertension, hyperlipidemia, non-insulin-dependent diabetes mellitus type 2, OSA on CPAP, advanced age, obesity.  Dyspnea on exertion Progressive Initial thought was to have him work with his PCP to uptitrate his antihypertensive medications and to improve his blood pressures. However, over the last 3 months patient's wife is concerned that his symptoms are getting progressively worse and are concerned about underlying coronary disease. Echo: Preserved LVEF, grade 2 diastolic dysfunction, no significant valvular heart disease. Nuclear stress test performed in May 2021 reported to be low risk study. Recommended medical therapy versus proceeding with coronary CTA. Patient and his wife called their son during the office encounter and the shared decision was to proceed with coronary CTA to evaluate for progressive CAD. Of note he did have a left heart catheterization back in 2006 and was noted to have nonobstructive CAD. Start Lopressor 25 mg p.o. twice daily. Check BMP  Nonobstructive atherosclerosis of coronary artery Continue current medical therapy. See above  Essential hypertension Currently managed by his primary care provider Dr. [facility]il [facility] at the [facility]. Given his risk factors would recommend a goal systolic blood pressure of 130 mmHg if able to tolerate. Medications reconciled. Low-salt diet recommended  Mixed hyperlipidemia Currently on pravastatin.   He denies myalgia or other side effects. Currently managed by primary care provider.  Patient is asked to bring copy  of his most recent labs at the next office visit for review.  Type 2 diabetes mellitus with diabetic neuropathy, without long-term current use of insulin ([place]) Educated on importance of glycemic control. Currently on ARB. Currently on statin therapy. Currently managed by primary care provider.  OSA on CPAP Educated in the importance of compliance with CPAP usage  Class 1 obesity due to excess calories with serious comorbidity and body mass index (BMI) of 31.0 to 31.9 in adult Body mass index is 31.14 kg/m. I reviewed with the patient the importance of diet, regular physical activity/exercise, weight loss.   Patient is educated on increasing physical activity gradually as tolerated.  With the goal of moderate intensity exercise for 30 minutes a day 5 days a week.   Orders Placed This Encounter  Procedures   CT CORONARY MORPH W/CTA COR W/SCORE W/CA W/CM &/OR WO/CM   Basic metabolic panel   --Continue cardiac medications as reconciled in final medication list. --Return in about  4 weeks (around 06/19/2021) for Follow up, Dyspnea, Review CCTA results. Or sooner if needed. --Continue follow-up with your primary care physician regarding the management of your other chronic comorbid conditions.  Patient's questions and concerns were addressed to his satisfaction. He voices understanding of the instructions provided during this encounter.   This note was created using a voice recognition software as a result there may be grammatical errors inadvertently enclosed that do not reflect the nature of this encounter. Every attempt is made to correct such errors.  Rex Kras, [place], [facility]  Pager: [phone] Office: [phone]

## 2021-05-26 LAB — BASIC METABOLIC PANEL
BUN/Creatinine Ratio: 12 (ref 10–24)
BUN: 16 mg/dL (ref 8–27)
CO2: 23 mmol/L (ref 20–29)
Calcium: 9.8 mg/dL (ref 8.6–10.2)
Chloride: 104 mmol/L (ref 96–106)
Creatinine, Ser: 1.38 mg/dL — ABNORMAL HIGH (ref 0.76–1.27)
Glucose: 85 mg/dL (ref 70–99)
Potassium: 4.4 mmol/L (ref 3.5–5.2)
Sodium: 141 mmol/L (ref 134–144)
eGFR: 55 mL/min/{1.73_m2} — ABNORMAL LOW (ref >59)

## 2021-06-12 ENCOUNTER — Telehealth (HOSPITAL_COMMUNITY): Payer: Self-pay | Admitting: Emergency Medicine

## 2021-06-12 NOTE — Telephone Encounter (Signed)
Reaching out to patient to offer assistance regarding upcoming cardiac imaging study; pt verbalizes understanding of appt date/time, parking situation and where to check in, pre-test NPO status and medications ordered, and verified current allergies; name and call back number provided for further questions should they arise Marchia Bond RN Navigator Cardiac Imaging [facility] [phone] office [phone] cell  25mg  metoprolol tart BID

## 2021-06-12 NOTE — Telephone Encounter (Signed)
Attempted to call patient regarding upcoming cardiac CT appointment. °Left message on voicemail with name and callback number °Netty Sullivant RN Navigator Cardiac Imaging °[facility] °[phone] Office °[phone] Cell ° °

## 2021-06-16 ENCOUNTER — Encounter (HOSPITAL_COMMUNITY): Payer: Self-pay

## 2021-06-16 ENCOUNTER — Other Ambulatory Visit: Payer: Self-pay

## 2021-06-16 ENCOUNTER — Ambulatory Visit (HOSPITAL_COMMUNITY)
Admission: RE | Admit: 2021-06-16 | Discharge: 2021-06-16 | Disposition: A | Discharge status: Home / Self Care | Payer: Medicare PPO | Source: Ambulatory Visit | Attending: Cardiology | Admitting: Cardiology

## 2021-06-16 DIAGNOSIS — R9431 Abnormal electrocardiogram [ECG] [EKG]: Secondary | ICD-10-CM | POA: Insufficient documentation

## 2021-06-16 DIAGNOSIS — K449 Diaphragmatic hernia without obstruction or gangrene: Secondary | ICD-10-CM | POA: Diagnosis not present

## 2021-06-16 DIAGNOSIS — I2510 Atherosclerotic heart disease of native coronary artery without angina pectoris: Secondary | ICD-10-CM | POA: Diagnosis not present

## 2021-06-16 DIAGNOSIS — R0609 Other forms of dyspnea: Secondary | ICD-10-CM | POA: Diagnosis not present

## 2021-06-16 MED ORDER — NITROGLYCERIN 0.4 MG SL SUBL
0.8000 mg | SUBLINGUAL_TABLET | Freq: Once | SUBLINGUAL | Status: AC
  Administered 2021-06-16: 0.8 mg via SUBLINGUAL

## 2021-06-16 MED ORDER — NITROGLYCERIN 0.4 MG SL SUBL
SUBLINGUAL_TABLET | SUBLINGUAL | Status: AC
  Filled 2021-06-16: qty 2

## 2021-06-16 MED ORDER — IOHEXOL 350 MG/ML SOLN
95.0000 mL | Freq: Once | INTRAVENOUS | Status: AC | PRN
  Administered 2021-06-16: 95 mL via INTRAVENOUS

## 2021-06-17 ENCOUNTER — Ambulatory Visit (HOSPITAL_COMMUNITY)
Admission: RE | Admit: 2021-06-17 | Discharge: 2021-06-17 | Disposition: A | Discharge status: Home / Self Care | Payer: Medicare PPO | Source: Ambulatory Visit | Attending: Cardiology | Admitting: Cardiology

## 2021-06-17 ENCOUNTER — Other Ambulatory Visit: Payer: Self-pay | Admitting: Cardiology

## 2021-06-17 DIAGNOSIS — R9431 Abnormal electrocardiogram [ECG] [EKG]: Secondary | ICD-10-CM | POA: Insufficient documentation

## 2021-06-17 DIAGNOSIS — R0609 Other forms of dyspnea: Secondary | ICD-10-CM | POA: Diagnosis present

## 2021-06-17 DIAGNOSIS — R931 Abnormal findings on diagnostic imaging of heart and coronary circulation: Secondary | ICD-10-CM

## 2021-06-17 DIAGNOSIS — R0602 Shortness of breath: Secondary | ICD-10-CM

## 2021-06-17 DIAGNOSIS — I2510 Atherosclerotic heart disease of native coronary artery without angina pectoris: Secondary | ICD-10-CM | POA: Insufficient documentation

## 2021-06-25 ENCOUNTER — Ambulatory Visit: Payer: Medicare PPO | Admitting: Cardiology

## 2021-06-25 ENCOUNTER — Encounter: Payer: Self-pay | Admitting: Cardiology

## 2021-06-25 ENCOUNTER — Other Ambulatory Visit: Payer: Self-pay

## 2021-06-25 VITALS — BP 153/75 | HR 62 | Resp 16 | Ht 73.0 in | Wt 242.4 lb

## 2021-06-25 DIAGNOSIS — Z9989 Dependence on other enabling machines and devices: Secondary | ICD-10-CM

## 2021-06-25 DIAGNOSIS — E782 Mixed hyperlipidemia: Secondary | ICD-10-CM

## 2021-06-25 DIAGNOSIS — Z6831 Body mass index (BMI) 31.0-31.9, adult: Secondary | ICD-10-CM

## 2021-06-25 DIAGNOSIS — E1140 Type 2 diabetes mellitus with diabetic neuropathy, unspecified: Secondary | ICD-10-CM

## 2021-06-25 DIAGNOSIS — I10 Essential (primary) hypertension: Secondary | ICD-10-CM

## 2021-06-25 DIAGNOSIS — E6609 Other obesity due to excess calories: Secondary | ICD-10-CM

## 2021-06-25 DIAGNOSIS — G4733 Obstructive sleep apnea (adult) (pediatric): Secondary | ICD-10-CM

## 2021-06-25 DIAGNOSIS — I2510 Atherosclerotic heart disease of native coronary artery without angina pectoris: Secondary | ICD-10-CM

## 2021-06-25 DIAGNOSIS — R0609 Other forms of dyspnea: Secondary | ICD-10-CM

## 2021-06-25 NOTE — Progress Notes (Signed)
[name] Date of Birth: 12/14/1950 MRN: [id number] Primary Care Provider:Dean, Carolann Littler, MD ( also sees Dr. Daneil Dolin at [facility]).  Former Cardiology Providers: Dr. Glennon Mac  Primary Cardiologist: Rex Kras, DO, [facility] (established care 12/07/2019)  Date: 06/25/21 Last Office Visit: 05/22/2021  Chief Complaint  Patient presents with   Shortness of Breath   Results    Review CCTA results   Follow-up    HPI  [name] is a 70 y.o. male who presents to the office with a  chief complaint of " review CCTA results." His past medical history and cardiovascular risk factors are: Nonobstructive coronary artery disease, hypertension, hyperlipidemia, non-insulin-dependent diabetes mellitus type 2, OSA on CPAP, advanced age, obesity.   Patient is accompanied by his wife [name] at today's office visit.  Patient, wife, and son were concerned about his effort related dyspnea.  They were noting that his symptoms are getting progressively worse and therefore the shared decision was to proceed with ischemic evaluation.  Patient underwent coronary CTA and was noted to have mild CAC with CAD-RADS 2 and per CT FFR stenosis or not hemodynamically significant.  Clinically patient states that his shortness of breath is improving but still present.  They did follow-up with pulmonary medicine and underwent PFTs.  They are informed of the PFTs are abnormal and currently in the process of getting an appointment with pulmonary medicine.  I have also reviewed the noncardiac findings on the recent coronary CTA raising the suspicion of asbestosis.  I will have a copy of the coronary CTA results forwarded to his PCP and a copy will be provided to the patient so that it can be reviewed with his pulmonologist.  Home blood pressures have improved with systolic blood pressures now ranging between 140-150 mmHg.  His antihypertensive medications are currently being managed by Dr. Daneil Dolin at the [facility].  Patient denies any chest pain at rest or with effort related activities.   FUNCTIONAL STATUS: He mows his lawn using a self-propelled machine, does yardwork as well.    ALLERGIES: Allergies  Allergen Reactions   Ace Inhibitors Swelling   Sulfa Antibiotics Swelling    MEDICATION LIST PRIOR TO VISIT: Current Outpatient Medications on File Prior to Visit  Medication Sig Dispense Refill   amLODipine (NORVASC) 10 MG tablet every morning.      aspirin EC 81 MG tablet Take 81 mg by mouth daily. Swallow whole.     b complex vitamins capsule Take 1 capsule by mouth daily.     carbamide peroxide (DEBROX) 6.5 % OTIC solution Place 5 drops into both ears as needed.     Cholecalciferol (VITAMIN D3) 1000 UNITS CAPS Take by mouth every morning.       fexofenadine (ALLEGRA) 180 MG tablet Take 180 mg by mouth daily.     fluticasone (FLONASE) 50 MCG/ACT nasal spray Place 2 sprays into both nostrils daily.     gabapentin (NEURONTIN) 400 MG capsule Take 300 mg by mouth 3 (three) times daily.     hydrALAZINE (APRESOLINE) 50 MG tablet Take 50 mg by mouth 3 (three) times daily.     hydrOXYzine (ATARAX/VISTARIL) 10 MG tablet Take 2 tablets by mouth at bedtime.     losartan (COZAAR) 100 MG tablet Take 100 mg by mouth every evening.     melatonin 3 MG TABS tablet Take 3 mg by mouth at bedtime.     meloxicam (MOBIC) 7.5 MG tablet Take 7.5 mg by mouth daily.  metFORMIN (GLUCOPHAGE) 500 MG tablet Take 500 mg by mouth 2 (two) times daily.     modafinil (PROVIGIL) 100 MG tablet Take 100 mg by mouth daily.     omeprazole (PRILOSEC OTC) 20 MG tablet Take 1 tablet (20 mg total) by mouth daily. 30 tablet 2   PARoxetine (PAXIL) 40 MG tablet Take 40 mg by mouth every morning.     pravastatin (PRAVACHOL) 20 MG tablet Take 20 mg by mouth at bedtime.     Probiotic Product (PROBIOTIC DAILY PO) Take by mouth.     QUEtiapine (SEROQUEL) 25 MG tablet Take 25 mg by mouth at bedtime.     spironolactone  (ALDACTONE) 25 MG tablet Take 50 mg by mouth daily.      tamsulosin (FLOMAX) 0.4 MG CAPS capsule Take 0.4 mg by mouth every morning.     triamcinolone ointment (KENALOG) 0.1 % Apply 1 application topically as needed.     Turmeric 400 MG CAPS Take 1 capsule by mouth daily.     vitamin E 400 UNIT capsule Take 400 Units by mouth daily.     metoprolol tartrate (LOPRESSOR) 25 MG tablet Take 1 tablet (25 mg total) by mouth 2 (two) times daily. 60 tablet 0   No current facility-administered medications on file prior to visit.    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   PTSD (post-traumatic stress disorder)    Weakness of left leg    Wears glasses     PAST SURGICAL HISTORY: Past Surgical History:  Procedure Laterality Date   CARDIAC CATHETERIZATION     CERVICAL DISC SURGERY     x 2   COLONOSCOPY     HIP ARTHROPLASTY     right   INGUINAL HERNIA REPAIR Left 09/06/2019   Procedure: LAPAROSCOPIC LEFT INGUINAL HERNIA WITH MESH;  Surgeon: Greer Pickerel, MD;  Location: [facility];  Service: General;  Laterality: Left;   JOINT REPLACEMENT     KNEE ARTHROPLASTY     bilateral   LUMBAR LAMINECTOMY/DECOMPRESSION MICRODISCECTOMY  08/31/2011   Procedure: LUMBAR LAMINECTOMY/DECOMPRESSION MICRODISCECTOMY;  Surgeon: Otilio Connors, MD;  Location: [place] [facility];  Service: Neurosurgery;  Laterality: N/A;  LumbarThree to Lumbar Five Laminectomy, possible Lumbar Four-Five Diskectomy   SHOULDER  SURGERY     left   UPPER GI ENDOSCOPY      FAMILY HISTORY: The patient family history includes Cancer in his brother; Diabetes in his mother; Hypertension in his brother and brother.   SOCIAL HISTORY:  The patient  reports that he has never smoked. He has never used smokeless tobacco. He reports that he does not drink alcohol and does not use drugs.  REVIEW OF SYSTEMS: Review of Systems  Constitutional: Negative for chills and fever.  HENT:  Negative for ear discharge, ear pain and nosebleeds.   Eyes:  Negative for blurred  vision and discharge.  Cardiovascular:  Positive for dyspnea on exertion (chronic on stable). Negative for chest pain, claudication, leg swelling, near-syncope, orthopnea, palpitations, paroxysmal nocturnal dyspnea and syncope.  Respiratory:  Positive for shortness of breath. Negative for cough, sleep disturbances due to breathing and snoring.   Endocrine: Negative for polydipsia, polyphagia and polyuria.  Hematologic/Lymphatic: Negative for bleeding problem.  Skin:  Negative for flushing and nail changes.  Musculoskeletal:  Negative for muscle cramps, muscle weakness and myalgias.  Gastrointestinal:  Negative for abdominal pain, dysphagia, hematemesis, hematochezia, melena, nausea and vomiting.  Neurological:  Positive for light-headedness. Negative for dizziness, focal weakness and paresthesias.   PHYSICAL EXAM: Vitals with BMI 06/25/2021 06/16/2021 06/16/2021  Height 6\' 1"  - -  Weight 242 lbs 6 oz - -  BMI 66.44 - -  Systolic 034 742 595  Diastolic 75 69 64  Pulse 62 56 51    CONSTITUTIONAL: Well-developed and well-nourished. No acute distress.  SKIN: Skin is warm and dry. No rash noted. No cyanosis. No pallor. No jaundice HEAD: Normocephalic and atraumatic.  EYES: No scleral icterus MOUTH/THROAT: Moist oral membranes.  NECK: No JVD present. No thyromegaly noted. No carotid bruits  LYMPHATIC: No visible cervical adenopathy.  CHEST Normal respiratory  effort. No intercostal retractions  LUNGS: Clear to auscultation bilaterally.  No stridor. No wheezes. No rales.  CARDIOVASCULAR: Regular rate and rhythm, positive S1-S2, no murmurs rubs or gallops appreciated. ABDOMINAL: Obese, soft, nontender, nondistended, positive bowel sounds in all 4 quadrants, no apparent ascites.  EXTREMITIES: No peripheral edema, warm to touch, 2+ dorsalis pedis pulses, patient has a brace on his both lower extremity secondary to foot drop HEMATOLOGIC: No significant bruising NEUROLOGIC: Oriented to person, place, and time. Nonfocal. Normal muscle tone.  PSYCHIATRIC: Normal mood and affect. Normal behavior. Cooperative  CARDIAC DATABASE: EKG:  08/22/2020: Normal sinus rhythm, 69 bpm, nonspecific T wave abnormality, without underlying injury pattern.  Echocardiogram: 12/18/2019: LVEF 55-60%, mild LVH, grade 2 diastolic impairment, mildly dilated left atrium, slightly dilated right atrium, mild MR, mild TR.  Stress Testing: Lexiscan/modified Bruce Tetrofosmin stress test 12/24/2019: Normal stress myocardial perfusion. Stress LVEF calculated 46%, although visually appears normal.   Heart catheterization: 09/28/2004 by Dr. Terrence Dupont: Per report, LVEF of 50-55%, left main patent, LAD patent, diagonal 1 patent, diagonal 2 small patent, LCx 15 to 20% ostial stenosis, OM1 10-15% ostial stenosis, OM 2 and 3 small in caliber overall patent, RCA 10-15% mid stenosis.  72 hour Holter monitor:  Dominant rhythm sinus bradycardia, followed by sinus tachycardia.  Heart rate 45-126 bpm.  Average heart rate 70 bpm.  Heart rate < 60 bpm for 50.6% of the recording.  Heart rate > 100 bpm for 15.2% of the recording.  No atrial fibrillation/atrial flutter/ventricular tachycardia/high grade AV block, sinus pause greater than or equal to 3 seconds in duration.  Total ventricular ectopic burden <0.01%.  Patient had 2 runs of supraventricular tachycardia.  The longest run was 4 beats in duration  at 112 bpm.  The fastest run was 4 beats in duration at 118 bpm.  Total supraventricular ectopic burden <0.07% (64 premature supraventricular contractions).  Number of patient triggered events: 0.   CCTA 06/16/2021: 1. Total coronary calcium score of 89.6. This was 65th percentile  for age and sex matched control.   2. Normal coronary origin with right dominance.   3. CAD-RADS = 2 Mild non-obstructive CAD (25-49%). Minimal stenosis (0-24%) distal left main due to calcified plaque otherwise no significant stenosis.  Minimal  stenosis (0-24%) due to mixed plaque in the proximal LAD, mild stenosis (25-49%) due to mixed plaque mid/distal LAD, otherwise no significant plaque or stenosis.  Ramus intermedius: Patent.   LCX is patent with no evidence of plaque or stenosis.   Minimal calcified plaque (0-24%) proximal RCA otherwise no significant evidence of plaque or stenosis.   4. CT-FFR negative for hemodynamically significant stenosis.   5. Moderate-sized hiatal hernia.  6. Evidence of prior asbestos exposure and related lung disease with multiple areas of calcified pleural plaque bilaterally and an area of elongated pulmonary density in the posteromedial left lower lobe with associated architectural distortion measuring 2 x 2.8 x 7.9 cm. 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 Given the impressive nature of this area of atelectasis/scarring as well as extensive scarring elsewhere in the left lung and calcified pleural plaques, this may be worth correlating with pulmonary function tests and following by Pulmonology.  LABORATORY DATA: CBC Latest Ref Rng & Units 09/03/2019 08/27/2011 11/01/2010  WBC 4.0 - 10.5 K/uL 5.3 5.9 9.5  Hemoglobin 13.0 - 17.0 g/dL 12.3(L) 13.9 9.7(L)  Hematocrit 39.0 - 52.0 % 40.8 42.4 31.4(L)  Platelets 150 - 400 K/uL  238 258 212    CMP Latest Ref Rng & Units 05/25/2021 09/03/2019 09/05/2012  Glucose 70 - 99 mg/dL 85 113(H) -  BUN 8 - 27 mg/dL 16 23 26(A)  Creatinine 0.76 - 1.27 mg/dL 1.38(H) 1.40(H) 1.7(A)  Sodium 134 - 144 mmol/L 141 139 139  Potassium 3.5 - 5.2 mmol/L 4.4 4.4 4.3  Chloride 96 - 106 mmol/L 104 103 -  CO2 20 - 29 mmol/L 23 26 -  Calcium 8.6 - 10.2 mg/dL 9.8 9.6 -  Total Protein 6.5 - 8.1 g/dL - 7.4 -  Total Bilirubin 0.3 - 1.2 mg/dL - 0.4 -  Alkaline Phos 38 - 126 U/L - 72 88  AST 15 - 41 U/L - 29 20  ALT 0 - 44 U/L - 26 22   FINAL MEDICATION LIST END OF ENCOUNTER: No orders of the defined types were placed in this encounter.    Medications Discontinued During This Encounter  Medication Reason   modafinil (PROVIGIL) 200 MG tablet Error   PARoxetine (PAXIL) 40 MG tablet Error     Current Outpatient Medications:    amLODipine (NORVASC) 10 MG tablet, every morning. , Disp: , Rfl:    aspirin EC 81 MG tablet, Take 81 mg by mouth daily. Swallow whole., Disp: , Rfl:    b complex vitamins capsule, Take 1 capsule by mouth daily., Disp: , Rfl:    carbamide peroxide (DEBROX) 6.5 % OTIC solution, Place 5 drops into both ears as needed., Disp: , Rfl:    Cholecalciferol (VITAMIN D3) 1000 UNITS CAPS, Take by mouth every morning.  , Disp: , Rfl:    fexofenadine (ALLEGRA) 180 MG tablet, Take 180 mg by mouth daily., Disp: , Rfl:    fluticasone (FLONASE) 50 MCG/ACT nasal spray, Place 2 sprays into both nostrils daily., Disp: , Rfl:    gabapentin (NEURONTIN) 400 MG capsule, Take 300 mg by mouth 3 (three) times daily., Disp: , Rfl:    hydrALAZINE (APRESOLINE) 50 MG tablet, Take 50 mg by mouth 3 (three) times daily., Disp: , Rfl:    hydrOXYzine (ATARAX/VISTARIL) 10 MG tablet, Take 2 tablets by mouth at bedtime., Disp: , Rfl:  losartan (COZAAR) 100 MG tablet, Take 100 mg by mouth every evening., Disp: , Rfl:    melatonin 3 MG TABS tablet, Take 3 mg by mouth at bedtime., Disp: , Rfl:     meloxicam (MOBIC) 7.5 MG tablet, Take 7.5 mg by mouth daily., Disp: , Rfl:    metFORMIN (GLUCOPHAGE) 500 MG tablet, Take 500 mg by mouth 2 (two) times daily., Disp: , Rfl:    modafinil (PROVIGIL) 100 MG tablet, Take 100 mg by mouth daily., Disp: , Rfl:    omeprazole (PRILOSEC OTC) 20 MG tablet, Take 1 tablet (20 mg total) by mouth daily., Disp: 30 tablet, Rfl: 2   PARoxetine (PAXIL) 40 MG tablet, Take 40 mg by mouth every morning., Disp: , Rfl:    pravastatin (PRAVACHOL) 20 MG tablet, Take 20 mg by mouth at bedtime., Disp: , Rfl:    Probiotic Product (PROBIOTIC DAILY PO), Take by mouth., Disp: , Rfl:    QUEtiapine (SEROQUEL) 25 MG tablet, Take 25 mg by mouth at bedtime., Disp: , Rfl:    spironolactone (ALDACTONE) 25 MG tablet, Take 50 mg by mouth daily. , Disp: , Rfl:    tamsulosin (FLOMAX) 0.4 MG CAPS capsule, Take 0.4 mg by mouth every morning., Disp: , Rfl:    triamcinolone ointment (KENALOG) 0.1 %, Apply 1 application topically as needed., Disp: , Rfl:    Turmeric 400 MG CAPS, Take 1 capsule by mouth daily., Disp: , Rfl:    vitamin E 400 UNIT capsule, Take 400 Units by mouth daily., Disp: , Rfl:    metoprolol tartrate (LOPRESSOR) 25 MG tablet, Take 1 tablet (25 mg total) by mouth 2 (two) times daily., Disp: 60 tablet, Rfl: 0  IMPRESSION:    ICD-10-CM   1. Dyspnea on exertion  R06.09     2. Nonobstructive atherosclerosis of coronary artery  I25.10     3. Essential hypertension  I10     4. Mixed hyperlipidemia  E78.2     5. Type 2 diabetes mellitus with diabetic neuropathy, without long-term current use of insulin (HCC)  E11.40     6. OSA on CPAP  G47.33    Z99.89     7. Class 1 obesity due to excess calories with serious comorbidity and body mass index (BMI) of 31.0 to 31.9 in adult  E66.09    Z68.31        RECOMMENDATIONS: BRADSHAW MINIHAN is a 70 y.o. male whose past medical history and cardiac risk factors include: Nonobstructive coronary artery disease, hypertension,  hyperlipidemia, non-insulin-dependent diabetes mellitus type 2, OSA on CPAP, advanced age, obesity.  Dyspnea on exertion Stable since last office visit. Echo: Preserved LVEF, grade 2 diastolic dysfunction, no significant valvular heart disease. Nuclear stress test performed in May 2021 reported to be low risk study. Coronary CTA: Total CAC 89.6AU, 65th percentile, CAD-RADS 2.  Noncardiac findings on the recent coronary CTA discussed with the patient and his wife at today's visit.  A copy of the results that have been provided to the patient so that they could be reviewed with his pulmonologist.  Another copy will be sent to his PCP. Patient was also noted to have abnormal PFT findings additional details are unknown at this time but plans to follow-up with pulmonary medicine. From a cardiovascular standpoint no additional testing warranted at this time.  Based on the clinical symptoms and test results pulmonary evaluation is clinically warranted.  Shared decision is to follow-up on an annual basis or sooner if change in  clinical status.  Nonobstructive atherosclerosis of coronary artery Total CAC 89.6AU, 65th percentile CAD-RADS 2  Coronary findings discussed with the patient and his wife at today's visit and noted above for further reference. Continue aspirin and statin therapy monitor for now Continue current medical therapy. See above  Essential hypertension Home blood pressures have improved since last office visit. Currently managed by his primary care provider Dr. Daneil Dolin at the [facility] Given his risk factors would recommend a goal systolic blood pressure of 130 mmHg if able to tolerate. Medications reconciled. Low-salt diet recommended  Mixed hyperlipidemia Currently on pravastatin.   He denies myalgia or other side effects. Currently managed by primary care provider.  Patient is asked to bring copy of his most recent labs at the next office visit for review.  Type 2  diabetes mellitus with diabetic neuropathy, without long-term current use of insulin ([place]) Educated on importance of glycemic control. Currently on ARB. Currently on statin therapy. Currently managed by primary care provider.  OSA on CPAP Educated in the importance of compliance with CPAP usage  Class 1 obesity due to excess calories with serious comorbidity and body mass index (BMI) of 31.0 to 31.9 in adult Body mass index is 31.98 kg/m. I reviewed with the patient the importance of diet, regular physical activity/exercise, weight loss.   Patient is educated on increasing physical activity gradually as tolerated.  With the goal of moderate intensity exercise for 30 minutes a day 5 days a week.    No orders of the defined types were placed in this encounter.  --Continue cardiac medications as reconciled in final medication list. --Return in about 1 year (around 06/25/2022) for Follow up, Coronary artery calcification. Or sooner if needed. --Continue follow-up with your primary care physician regarding the management of your other chronic comorbid conditions.  Patient's questions and concerns were addressed to his satisfaction. He voices understanding of the instructions provided during this encounter.   This note was created using a voice recognition software as a result there may be grammatical errors inadvertently enclosed that do not reflect the nature of this encounter. Every attempt is made to correct such errors.  Rex Kras, [place], [facility]  Pager: [phone] Office: [phone]

## 2021-07-15 ENCOUNTER — Telehealth: Payer: Self-pay | Admitting: Nurse Practitioner

## 2021-07-17 ENCOUNTER — Ambulatory Visit: Payer: No Typology Code available for payment source | Admitting: Nurse Practitioner

## 2021-07-21 ENCOUNTER — Encounter (HOSPITAL_BASED_OUTPATIENT_CLINIC_OR_DEPARTMENT_OTHER): Payer: Self-pay

## 2021-07-21 DIAGNOSIS — G4733 Obstructive sleep apnea (adult) (pediatric): Secondary | ICD-10-CM

## 2021-07-21 NOTE — Progress Notes (Unsigned)
bi 

## 2021-07-22 ENCOUNTER — Ambulatory Visit: Payer: No Typology Code available for payment source | Admitting: Nurse Practitioner

## 2021-07-22 NOTE — Progress Notes (Signed)
@Patient  ID: [name], male    DOB: Oct 16, 1950, 70 y.o.   MRN: [id number]  Chief Complaint  Patient presents with   Follow-up    Chronic SOB at anytime    Referring provider: Harl Bowie, MD  HPI: 70 year old male, never smoker followed for obstructive sleep apnea on CPAP.  He is a patient of Dr. Judson Roch and was last seen in office on 07/10/2019.  Past medical history significant for hypertension, allergic rhinitis, DM 2, low back pain, narcolepsy, and insomnia.   TEST/EVENTS:  08/27/2018 NPSG/MSLT: AHI 6.2, SPO2 low 81% 12/07/2019 cardiac cath: Mild 20 to 30% stenosis in obtuse marginals, 10 to 15% stenosis in proximal RCA, normal LAD.  Normal LVEF at 50 to 55% 12/18/2019 echocardiogram: LVEF 55 to 60%.  Mild LVH.  Grade 2 diastolic dysfunction.  LA mildly dilated.  RA slightly dilated.  Grade 1 mitral regurgitation.  Mild tricuspid regurgitation. 06/16/2021 coronary calcium scan: Incidental findings of multiple calcified pleural plaques in the posterior left hemothorax, diaphragmatic pleural surface on the left and medial and lateral right pleural space.  Area of elongated pulmonary density in the posteromedial left lower lobe with associated architectural distortion measures approximately 2 x 2.8 cm in transverse dimensions and 7.9 cm in height.  Shape of this area of density highly unusual for neoplastic process, appearance of probable chronic atelectasis related to asbestos exposure and asbestos related lung disease.  Extensive scarring is also present throughout most of the visualized left lung  07/23/2021: Today - acute visit Patient presents today with wife for reported worsening dyspnea upon exertion and at rest. He reports he has been experiencing this for years but it has recently begun to worsen.  He has an occasional dry cough, which he has also had for years. He states that it flares with his seasonal allergies and feels it's most related to post nasal drip. He denies  orthopnea, PND, chest pain, wheezing, hemoptysis, joint pain/swelling, or lower extremity swelling. He recently had a coronary calcium CT scan in November that was ordered by cardiology with incidental findings of asbestos related lung disease. He also had PFTs ordered by the [facility] which showed a mild obstructive defect and a mild restrictive defect per the note. Full PFT results have been requested. He was ordered Trelegy by [facility] provider; however, was told to stop a week before coming to see [place]. He did notice some improvement in his dyspnea with this but reports it is incredibly expensive. He does not have a rescue inhaler.  He continues on Flonase nasal spray and allegra daily for allergy symptoms. He has not been worked up in our clinic for these symptoms.  He was previously followed for OSA and last seen two years ago. He has been compliant with his auto CPAP 10-20 usage (30 days - 100%; average 7 hours, 4 min); however, his wife notes that he is still having episodes of snoring and apnea at night, despite proper use. This is consistent with his download report of AHI of 10.7. His median pressure is 14.5 cmH2O and 95th percentile 17.8 cmH2O. [facility] which is scheduled for January. He does have a history narcolepsy, insomnia and PTSD for which he is on many sedating medications. Provigil for narcolepsy symptoms. [place] 13. Denies drowsy driving. Usually takes 1 nap a day.    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 Incidental finding on coronary calcium CT for suspected asbestos related lung disease.   Serology: No hx  Simple walk test: SpO2 low 97% on room air; walked _   Allergies  Allergen  Reactions   Ace Inhibitors Swelling   Sulfa Antibiotics Swelling    Immunization History  Administered Date(s) Administered   Fluad Quad(high Dose 65+) 05/12/2020, 06/23/2021   Influenza Split 05/16/2014   Influenza, High Dose Seasonal PF 05/24/2017, 05/10/2019   Influenza,inj,Quad PF,6+ Mos 06/18/2016   Influenza-Unspecified 05/28/2003, 07/16/2004, 07/01/2005, 05/31/2006, 05/17/2007, 09/14/2007, 04/29/2008, 05/23/2009, 05/16/2010, 05/06/2011, 06/08/2012, 06/12/2013, 06/03/2015   Moderna Covid-19 Vaccine Bivalent Booster 63yrs & up 06/18/2021   Moderna Sars-Covid-2 Vaccination 11/05/2019, 12/03/2019, 08/25/2020   Pneumococcal Conjugate-13 12/22/2016   Pneumococcal Polysaccharide-23 05/10/2019   Pneumococcal-Unspecified 05/28/2003   Tdap 06/08/2012   Zoster Recombinat (Shingrix) 05/12/2020, 10/30/2020   Zoster, Live 01/02/2014    Past Medical History:  Diagnosis Date   Allergic rhinitis    Anxiety    Carpal tunnel syndrome on right    Coronary artery disease    Depression    Diabetes mellitus    DJD (degenerative joint disease)    Dysphagia    GERD (gastroesophageal reflux disease)    Hyperlipidemia    Hypertension    Hypothyroidism    Left inguinal hernia    Mental disorder    Narcolepsy    takes provigil   OSA on CPAP    Osteoporosis    Peripheral neuropathy    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      aspirin EC 81 MG tablet Take 81 mg by mouth daily. Swallow whole.     b complex vitamins capsule Take 1 capsule by mouth daily.     carbamide peroxide (DEBROX) 6.5 % OTIC solution Place 5 drops  into both ears as needed.     Cholecalciferol (VITAMIN D3) 1000 UNITS CAPS Take by mouth every morning.       fexofenadine (ALLEGRA) 180 MG tablet Take 180 mg by mouth daily.     fluticasone (FLONASE) 50 MCG/ACT nasal spray Place 2 sprays into both nostrils daily.     gabapentin (NEURONTIN) 400 MG capsule Take 300 mg by mouth 3 (three) times daily.     hydrALAZINE (APRESOLINE) 50 MG tablet Take 50 mg by mouth 3 (three) times daily.     hydrOXYzine (ATARAX/VISTARIL) 10 MG tablet Take 2 tablets by mouth at bedtime. (Patient not taking: Reported on 07/23/2021)     losartan (COZAAR) 100 MG tablet Take 100 mg by mouth every evening.     melatonin 3 MG TABS tablet Take 3 mg by mouth at bedtime.     meloxicam (MOBIC) 7.5 MG tablet Take 7.5 mg by mouth daily.     metFORMIN (GLUCOPHAGE) 500 MG tablet Take 500 mg by mouth 2 (two) times daily.     metoprolol tartrate (LOPRESSOR) 25 MG tablet Take 1 tablet (25 mg total) by mouth 2 (two)  times daily. 60 tablet 0   modafinil (PROVIGIL) 100 MG tablet Take 100 mg by mouth daily.     omeprazole (PRILOSEC OTC) 20 MG tablet Take 1 tablet (20 mg total) by mouth daily. 30 tablet 2   PARoxetine (PAXIL) 40 MG tablet Take 40 mg by mouth every morning.     pravastatin (PRAVACHOL) 20 MG tablet Take 20 mg by mouth at bedtime.     Probiotic Product (PROBIOTIC DAILY PO) Take by mouth.     QUEtiapine (SEROQUEL) 25 MG tablet Take 25 mg by mouth at bedtime.     spironolactone (ALDACTONE) 25 MG tablet Take 50 mg by mouth daily.      tamsulosin (FLOMAX) 0.4 MG CAPS capsule Take 0.4 mg by mouth every morning.     triamcinolone ointment (KENALOG) 0.1 % Apply 1 application topically as needed.     Turmeric 400 MG CAPS Take 1 capsule by mouth daily.     vitamin E 400 UNIT capsule Take 400 Units by mouth daily.     No facility-administered medications prior to visit.     Review of Systems:   Constitutional: No weight loss or gain, night sweats, fevers, chills, fatigue, or  lassitude.  HEENT: No headaches, difficulty swallowing, tooth/dental problems, or sore throat. No sneezing, itching, ear ache. +seasonal allergy symptoms including clear rhinorrhea and nasal congestion (controlled on current regimen) CV:  No chest pain, orthopnea, PND, swelling in lower extremities, anasarca, dizziness, palpitations, syncope Resp: +shortness of breath primarily upon exertion; occasionally at rest. Predominately non-productive cough (flares with allergies). No excess mucus or change in color of mucus. No hemoptysis. No wheezing.  No chest wall deformity GI:  No heartburn, indigestion, abdominal pain, nausea, vomiting, diarrhea, change in bowel habits, loss of appetite, bloody stools.  GU: No dysuria, change in color of urine, urgency or frequency.  No flank pain, no hematuria  Skin: No rash, lesions, ulcerations MSK:  No joint pain or swelling.  No decreased range of motion.  No back pain. 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  Psych: No depression or anxiety. Mood stable. +insomnia, narcolepsy (well-controlled on current regimen)    Physical Exam:  BP (!) 154/74 (BP Location: Right Arm, Patient Position: Sitting, Cuff Size: Normal)   Pulse 76   Temp 98.7 F (37.1 C) (Oral)   Ht 6' 1.5" (1.867 m)   Wt 244 lb 3.2 oz (110.8 kg)   SpO2 100%   BMI 31.78 kg/m   GEN: Pleasant, interactive, well-nourished; obese; in no acute distress. HEENT:  Normocephalic and atraumatic. EACs patent bilaterally. TM pearly gray with present light reflex bilaterally. PERRLA. Sclera white. Nasal turbinates pink, moist and patent bilaterally. No rhinorrhea present. Oropharynx pink and moist, without exudate or edema. No lesions, ulcerations, or postnasal drip.  NECK:  Supple w/ fair ROM. No JVD present. Normal carotid impulses w/o bruits. Thyroid symmetrical with no goiter or nodules palpated. No lymphadenopathy.   CV: RRR, no m/r/g, no  peripheral edema. Pulses intact, +2 bilaterally. No cyanosis, pallor or clubbing. PULMONARY:  Unlabored, regular breathing. Clear bilaterally A&P w/o wheezes/rales/rhonchi. No accessory muscle use. No dullness to percussion. GI: BS present and normoactive. Soft, non-tender to palpation. No organomegaly or masses detected. No CVA tenderness. MSK: No erythema, warmth or tenderness. Cap refil <2 sec all extrem. No deformities or joint swelling noted.  Neuro: A/Ox3. No focal deficits noted.   Skin: Warm, no lesions or rashe Psych: Normal  affect and behavior. Judgement and thought content appropriate.     Lab Results:  CBC    Component Value Date/Time   WBC 6.0 07/23/2021 1027   RBC 5.25 07/23/2021 1027   HGB 12.4 (L) 07/23/2021 1027   HCT 40.2 07/23/2021 1027   PLT 229.0 07/23/2021 1027   MCV 76.6 (L) 07/23/2021 1027   MCH 23.3 (L) 09/03/2019 1016   MCHC 30.9 07/23/2021 1027   RDW 14.8 07/23/2021 1027   LYMPHSABS 1.5 07/23/2021 1027   MONOABS 0.5 07/23/2021 1027   EOSABS 0.3 07/23/2021 1027   BASOSABS 0.1 07/23/2021 1027    BMET    Component Value Date/Time   NA 140 07/23/2021 1024   NA 141 05/25/2021 0937   K 4.2 07/23/2021 1024   CL 104 07/23/2021 1024   CO2 27 07/23/2021 1024   GLUCOSE 93 07/23/2021 1024   BUN 21 07/23/2021 1024   BUN 16 05/25/2021 0937   CREATININE 1.25 07/23/2021 1024   CALCIUM 9.8 07/23/2021 1024   GFRNONAA 51 (L) 09/03/2019 1016   GFRAA 59 (L) 09/03/2019 1016    BNP No results found for: BNP   Imaging:  No results found.    No flowsheet data found.  No results found for: NITRICOXIDE      Assessment & Plan:   OSA on CPAP Although compliant, AHI remains 10.7/hr. Increase min pressure to 14 for auto CPAP 14-20cmH2O. Order sent to DME. BiPAP titration study ordered by [facility] provider in January.   COPD (chronic obstructive pulmonary disease) ([place]) Newly diagnosed per pt. We do not have full PFT results from [place] in our system so these  have been requested. Note states pre BD FEV1 55%, FVC 63%, and ratio 66 with post BD increase of 9.6% in FEV1. States TLC is decreased <80% but does not have values. States DLCO is normal, but again, no values. Previously put on Trelegy with some improvement in symptoms; however, expensive. Will trial with Stiolto. Albuterol inhaler as needed.  ILD workup with labs and HRCT ordered. Follow up with Dr. Ander Slade after.   Patient Instructions  Start Stiolto respimat 2 puffs daily. Take this every day. This is your maintenance inhaler Albuterol inhaler 2 puffs every 6 hours as needed for shortness of breath or wheezing. Notify if symptoms persist despite rescue inhaler/neb use.   Continue to use CPAP every night, minimum of 4-6 hours a night.  Change pressure to 14-20 Change equipment every 30 days or as directed by DME. Wash your tubing with warm soap and water daily, hang to dry. Wash humidifier portion weekly.  Maintain clean equipment, as directed by home health agency.  Be aware of reduced alertness and do not drive or operate heavy machinery if experiencing this or drowsiness.  Exercise encouraged, as tolerated. Healthy weight management discussed.  Avoid or decrease alcohol consumption and medications that make you more sleepy, if possible. Notify if persistent daytime sleepiness occurs even with consistent use of CPAP.   BiPAP titration studied ordered for January. Follow up with ordering physician.   [facility] as [facility].   Labs today for ILD workup  High resolution CT chest for further evaluation of ILD before next appointment.  Walk oximetry today 97% on room air.  PFTs completed in November by [place] showed moderate obstruction.   Follow up in 4-6 weeks with Dr. Ander Slade. If symptoms do not improve or worsen, please contact office for sooner follow up or seek emergency care.  Allergic rhinitis due to pollen Well-controlled. Continue allegra and flonase,  as prescribed by [facility] provider. See above plan.  Abnormal CT scan of lung Coronary calcium CT scan showed incidental findings in the left lung associated with probable chronic atelectasis related to asbestos exposure and asbestos related lung disease. Extensive scarring was noted. Further ILD workup warranted. See above plan.  Dyspnea on exertion Recent COPD diagnosis. Note states mild obstruction - will obtain full PFT results from [place]. Also mild restriction noted, consistent with asbestos suspected ILD on coronary calcium CT. Further ILD workup ordered. See above plan.  Contact with and (suspected) exposure to asbestos Suspected with incidental CT results. No known exposure. 20 years of [place] service - discharge '89. House was built in '58. ILD workup ordered. See above plan.     Clayton Bibles, NP 07/23/2021  Pt aware and understands NP's role.

## 2021-07-23 ENCOUNTER — Other Ambulatory Visit: Payer: Self-pay

## 2021-07-23 ENCOUNTER — Encounter: Payer: Self-pay | Admitting: Nurse Practitioner

## 2021-07-23 ENCOUNTER — Ambulatory Visit (INDEPENDENT_AMBULATORY_CARE_PROVIDER_SITE_OTHER): Payer: No Typology Code available for payment source | Admitting: Nurse Practitioner

## 2021-07-23 VITALS — BP 154/74 | HR 76 | Temp 98.7°F | Ht 73.5 in | Wt 244.2 lb

## 2021-07-23 DIAGNOSIS — J449 Chronic obstructive pulmonary disease, unspecified: Secondary | ICD-10-CM

## 2021-07-23 DIAGNOSIS — R918 Other nonspecific abnormal finding of lung field: Secondary | ICD-10-CM

## 2021-07-23 DIAGNOSIS — Z77090 Contact with and (suspected) exposure to asbestos: Secondary | ICD-10-CM

## 2021-07-23 DIAGNOSIS — Z9989 Dependence on other enabling machines and devices: Secondary | ICD-10-CM

## 2021-07-23 DIAGNOSIS — G4733 Obstructive sleep apnea (adult) (pediatric): Secondary | ICD-10-CM

## 2021-07-23 DIAGNOSIS — J301 Allergic rhinitis due to pollen: Secondary | ICD-10-CM

## 2021-07-23 DIAGNOSIS — R0609 Other forms of dyspnea: Secondary | ICD-10-CM | POA: Diagnosis not present

## 2021-07-23 HISTORY — DX: Other nonspecific abnormal finding of lung field: R91.8

## 2021-07-23 HISTORY — DX: Contact with and (suspected) exposure to asbestos: Z77.090

## 2021-07-23 LAB — CBC WITH DIFFERENTIAL/PLATELET
Basophils Absolute: 0.1 10*3/uL (ref 0.0–0.1)
Basophils Relative: 1 % (ref 0.0–3.0)
Eosinophils Absolute: 0.3 10*3/uL (ref 0.0–0.7)
Eosinophils Relative: 5.7 % — ABNORMAL HIGH (ref 0.0–5.0)
HCT: 40.2 % (ref 39.0–52.0)
Hemoglobin: 12.4 g/dL — ABNORMAL LOW (ref 13.0–17.0)
Lymphocytes Relative: 25.1 % (ref 12.0–46.0)
Lymphs Abs: 1.5 10*3/uL (ref 0.7–4.0)
MCHC: 30.9 g/dL (ref 30.0–36.0)
MCV: 76.6 fl — ABNORMAL LOW (ref 78.0–100.0)
Monocytes Absolute: 0.5 10*3/uL (ref 0.1–1.0)
Monocytes Relative: 8.6 % (ref 3.0–12.0)
Neutro Abs: 3.6 10*3/uL (ref 1.4–7.7)
Neutrophils Relative %: 59.6 % (ref 43.0–77.0)
Platelets: 229 10*3/uL (ref 150.0–400.0)
RBC: 5.25 Mil/uL (ref 4.22–5.81)
RDW: 14.8 % (ref 11.5–15.5)
WBC: 6 10*3/uL (ref 4.0–10.5)

## 2021-07-23 LAB — BASIC METABOLIC PANEL
BUN: 21 mg/dL (ref 6–23)
CO2: 27 mEq/L (ref 19–32)
Calcium: 9.8 mg/dL (ref 8.4–10.5)
Chloride: 104 mEq/L (ref 96–112)
Creatinine, Ser: 1.25 mg/dL (ref 0.40–1.50)
GFR: 58.54 mL/min — ABNORMAL LOW (ref >60.00)
Glucose, Bld: 93 mg/dL (ref 70–99)
Potassium: 4.2 mEq/L (ref 3.5–5.1)
Sodium: 140 mEq/L (ref 135–145)

## 2021-07-23 LAB — HEPATIC FUNCTION PANEL
ALT: 22 U/L (ref 0–53)
AST: 28 U/L (ref 0–37)
Albumin: 4.4 g/dL (ref 3.5–5.2)
Alkaline Phosphatase: 61 U/L (ref 39–117)
Bilirubin, Direct: 0.1 mg/dL (ref 0.0–0.3)
Total Bilirubin: 0.4 mg/dL (ref 0.2–1.2)
Total Protein: 7.5 g/dL (ref 6.0–8.3)

## 2021-07-23 MED ORDER — ALBUTEROL SULFATE HFA 108 (90 BASE) MCG/ACT IN AERS: 2.0000 | INHALATION_SPRAY | Freq: Four times a day (QID) | RESPIRATORY_TRACT | 6 refills | Status: DC | PRN

## 2021-07-23 MED ORDER — STIOLTO RESPIMAT 2.5-2.5 MCG/ACT IN AERS: 2.0000 | INHALATION_SPRAY | Freq: Every day | RESPIRATORY_TRACT | 3 refills | Status: DC

## 2021-07-23 NOTE — Assessment & Plan Note (Signed)
Well-controlled. Continue allegra and flonase, as prescribed by [facility] provider. See above plan.

## 2021-07-23 NOTE — Assessment & Plan Note (Signed)
Recent COPD diagnosis. Note states mild obstruction - will obtain full PFT results from [place]. Also mild restriction noted, consistent with asbestos suspected ILD on coronary calcium CT. Further ILD workup ordered. See above plan.

## 2021-07-23 NOTE — Assessment & Plan Note (Signed)
Coronary calcium CT scan showed incidental findings in the left lung associated with probable chronic atelectasis related to asbestos exposure and asbestos related lung disease. Extensive scarring was noted. Further ILD workup warranted. See above plan.

## 2021-07-23 NOTE — Assessment & Plan Note (Signed)
Newly diagnosed per pt. We do not have full PFT results from [place] in our system so these have been requested. Note states pre BD FEV1 55%, FVC 63%, and ratio 66 with post BD increase of 9.6% in FEV1. States TLC is decreased <80% but does not have values. States DLCO is normal, but again, no values. Previously put on Trelegy with some improvement in symptoms; however, expensive. Will trial with Stiolto. Albuterol inhaler as needed.  ILD workup with labs and HRCT ordered. Follow up with Dr. Ander Slade after.   Patient Instructions  Start Stiolto respimat 2 puffs daily. Take this every day. This is your maintenance inhaler Albuterol inhaler 2 puffs every 6 hours as needed for shortness of breath or wheezing. Notify if symptoms persist despite rescue inhaler/neb use.   Continue to use CPAP every night, minimum of 4-6 hours a night.  Change pressure to 14-20 Change equipment every 30 days or as directed by DME. Wash your tubing with warm soap and water daily, hang to dry. Wash humidifier portion weekly.  Maintain clean equipment, as directed by home health agency.  Be aware of reduced alertness and do not drive or operate heavy machinery if experiencing this or drowsiness.  Exercise encouraged, as tolerated. Healthy weight management discussed.  Avoid or decrease alcohol consumption and medications that make you more sleepy, if possible. Notify if persistent daytime sleepiness occurs even with consistent use of CPAP.   BiPAP titration studied ordered for January. Follow up with ordering physician.   [facility] and [facility] as ordered.   Labs today for ILD workup  High resolution CT chest for further evaluation of ILD before next appointment.  Walk oximetry today 97% on room air.  PFTs completed in November by [place] showed moderate obstruction.   Follow up in 4-6 weeks with Dr. Ander Slade. If symptoms do not improve or worsen, please contact office for sooner follow up or seek emergency  care.

## 2021-07-23 NOTE — Patient Instructions (Addendum)
Start Stiolto respimat 2 puffs daily. Take this every day. This is your maintenance inhaler Albuterol inhaler 2 puffs every 6 hours as needed for shortness of breath or wheezing. Notify if symptoms persist despite rescue inhaler/neb use.   Continue to use CPAP every night, minimum of 4-6 hours a night.  Change pressure to 14-20 Change equipment every 30 days or as directed by DME. Wash your tubing with warm soap and water daily, hang to dry. Wash humidifier portion weekly.  Maintain clean equipment, as directed by home health agency.  Be aware of reduced alertness and do not drive or operate heavy machinery if experiencing this or drowsiness.  Exercise encouraged, as tolerated. Healthy weight management discussed.  Avoid or decrease alcohol consumption and medications that make you more sleepy, if possible. Notify if persistent daytime sleepiness occurs even with consistent use of CPAP.   BiPAP titration studied ordered for January. Follow up with ordering physician.   [facility] as ordered.   Labs today for ILD workup  High resolution CT chest for further evaluation of ILD before next appointment.  Walk oximetry today 97% on room air.  PFTs completed in November by [place] showed moderate obstruction.   Follow up in 4-6 weeks with Dr. Ander Slade. If symptoms do not improve or worsen, please contact office for sooner follow up or seek emergency care.

## 2021-07-23 NOTE — Assessment & Plan Note (Addendum)
Although compliant, AHI remains 10.7/hr. Increase min pressure to 14 for auto CPAP 14-20cmH2O. Order sent to DME. BiPAP titration study ordered by [facility] provider in January.

## 2021-07-23 NOTE — Assessment & Plan Note (Signed)
Suspected with incidental CT results. No known exposure. 44 years of [employer] [place] service - discharge '89. House was built in '58. ILD workup ordered. See above plan.

## 2021-07-25 LAB — QUANTIFERON-TB GOLD PLUS
Mitogen-NIL: >10 IU/mL
NIL: 0.04 IU/mL
QuantiFERON-TB Gold Plus: NEGATIVE
TB1-NIL: <0 IU/mL
TB2-NIL: <0 IU/mL

## 2021-07-27 NOTE — Progress Notes (Signed)
Tried calling pt and there was no answer- LMTCB.  

## 2021-07-27 NOTE — Progress Notes (Signed)
Please notify that autoimmune/ILD labs were all nl. CBC did show a microcytic anemia, likely iron deficiency, and he should follow up with his PCP regarding this. Ensure he has started his new inhaler and discuss if he has noticed any improvement. Follow up with Dr. Ander Slade as scheduled. Thanks!

## 2021-07-28 ENCOUNTER — Telehealth: Payer: Self-pay | Admitting: Nurse Practitioner

## 2021-07-28 LAB — CYCLIC CITRUL PEPTIDE ANTIBODY, IGG: Cyclic Citrullin Peptide Ab: <16 UNITS

## 2021-07-28 LAB — ANTI-DNA ANTIBODY, DOUBLE-STRANDED: ds DNA Ab: <1 IU/mL

## 2021-07-28 LAB — ANA: Anti Nuclear Antibody (ANA): NEGATIVE

## 2021-07-28 LAB — RHEUMATOID FACTOR: Rheumatoid fact SerPl-aCnc: <14 IU/mL (ref <14)

## 2021-07-28 NOTE — Telephone Encounter (Signed)
Tim Bibles, NP  Rosana Berger, CMA Please notify that autoimmune/ILD labs were all nl. CBC did show a microcytic anemia, likely iron deficiency, and he should follow up with his PCP regarding this. Ensure he has started his new inhaler and discuss if he has noticed any improvement. Follow up with Dr. Ander Slade as scheduled. Thanks!        Spoke with pt and notified of results per [facility]. Pt verbalized understanding and denied any questions.  He has not started his stiolto yet but plans to pick up asap. He will keep appt with Dr Ander Slade and call sooner if needed.

## 2021-08-04 NOTE — Telephone Encounter (Signed)
Seems like encounter was open in error so closing encounter.  

## 2021-08-14 ENCOUNTER — Other Ambulatory Visit: Payer: Self-pay

## 2021-08-14 ENCOUNTER — Ambulatory Visit (INDEPENDENT_AMBULATORY_CARE_PROVIDER_SITE_OTHER)
Admission: RE | Admit: 2021-08-14 | Discharge: 2021-08-14 | Disposition: A | Discharge status: Home / Self Care | Payer: No Typology Code available for payment source | Source: Ambulatory Visit | Attending: Nurse Practitioner | Admitting: Nurse Practitioner

## 2021-08-14 DIAGNOSIS — Z77090 Contact with and (suspected) exposure to asbestos: Secondary | ICD-10-CM

## 2021-08-14 DIAGNOSIS — R0609 Other forms of dyspnea: Secondary | ICD-10-CM | POA: Diagnosis not present

## 2021-08-19 NOTE — Progress Notes (Signed)
Please notify patient that his HRCT was consistent with asbestos related lung disease. He will review these results further with Dr. Ander Slade at his follow up and discuss any possible next steps.   It also showed incidental findings of atherosclerosis, which he is on a statin for already, and a moderate sized hiatal hernia. If he is having any reflux symptoms or feelings of fullness in his chest/abd, I can refer him to a general surgeon for further workup and eval. Thanks!

## 2021-08-26 ENCOUNTER — Other Ambulatory Visit: Payer: Self-pay

## 2021-08-26 ENCOUNTER — Encounter: Payer: Self-pay | Admitting: Pulmonary Disease

## 2021-08-26 ENCOUNTER — Ambulatory Visit (INDEPENDENT_AMBULATORY_CARE_PROVIDER_SITE_OTHER): Payer: No Typology Code available for payment source | Admitting: Pulmonary Disease

## 2021-08-26 VITALS — BP 136/72 | HR 74 | Temp 98.5°F | Ht 73.5 in | Wt 237.0 lb

## 2021-08-26 DIAGNOSIS — Z9989 Dependence on other enabling machines and devices: Secondary | ICD-10-CM | POA: Diagnosis not present

## 2021-08-26 DIAGNOSIS — G4733 Obstructive sleep apnea (adult) (pediatric): Secondary | ICD-10-CM | POA: Diagnosis not present

## 2021-08-26 NOTE — Patient Instructions (Signed)
Continue using CPAP nightly  Call us if he continues to have episodes of early awakening  Your CT scan showed evidence of exposure to asbestos in the past with pleural plaques-it is progressive but usually over many many years  Continue using the inhalers that was prescribed  I will see you back in about 6 months

## 2021-08-26 NOTE — Progress Notes (Signed)
[name]    [id number]    06/29/1951  Primary Care Physician:Dean, Carolann Littler, MD  Referring Physician: Rogers Blocker, [address] [place],  [place] [address]  Chief complaint:   Patient with a history of narcolepsy, obstructive sleep apnea Compliant with CPAP, compliant with stimulant medications  HPI:  He was diagnosed in 2007 following a sleep study and M SLT Diagnosed with narcolepsy Has been compliant with CPAP use Compliant with stimulant use currently using modafinil 200 mg in the morning and 100 mg in the afternoon  Has been doing relatively well Compliant with CPAP use Usually wakes up about 6:30 in the morning A few times recently is woken up about 4 not able to go back to sleep  Recently had a CT scan of his chest showing pleural plaques and rounded atelectasis  His health has been generally well  Recently started on inhalers for shortness of breath  History of PTSD  Exposures: No significant exposures Smoking history: Never smoker   Outpatient Encounter Medications as of 08/26/2021  Medication Sig   albuterol (VENTOLIN HFA) 108 (90 Base) MCG/ACT inhaler Inhale 2 puffs into the lungs every 6 (six) hours as needed for wheezing or shortness of breath.   amLODipine (NORVASC) 10 MG tablet every morning.    aspirin EC 81 MG tablet Take 81 mg by mouth daily. Swallow whole.   b complex vitamins capsule Take 1 capsule by mouth daily.   carbamide peroxide (DEBROX) 6.5 % OTIC solution Place 5 drops into both ears as needed.   Cholecalciferol (VITAMIN D3) 1000 UNITS CAPS Take by mouth every morning.     fexofenadine (ALLEGRA) 180 MG tablet Take 180 mg by mouth daily.   fluticasone (FLONASE) 50 MCG/ACT nasal spray Place 2 sprays into both nostrils daily.   gabapentin (NEURONTIN) 400 MG capsule Take 300 mg by mouth 3 (three) times daily.   hydrALAZINE (APRESOLINE) 50 MG tablet Take 50 mg by mouth 3 (three) times daily.   hydrOXYzine  (ATARAX/VISTARIL) 10 MG tablet Take 2 tablets by mouth at bedtime.   losartan (COZAAR) 100 MG tablet Take 100 mg by mouth every evening.   melatonin 3 MG TABS tablet Take 3 mg by mouth at bedtime.   meloxicam (MOBIC) 7.5 MG tablet Take 7.5 mg by mouth daily.   metFORMIN (GLUCOPHAGE) 500 MG tablet Take 500 mg by mouth 2 (two) times daily.   modafinil (PROVIGIL) 100 MG tablet Take 100 mg by mouth daily.   omeprazole (PRILOSEC OTC) 20 MG tablet Take 1 tablet (20 mg total) by mouth daily.   PARoxetine (PAXIL) 40 MG tablet Take 40 mg by mouth every morning.   pravastatin (PRAVACHOL) 20 MG tablet Take 20 mg by mouth at bedtime.   Probiotic Product (PROBIOTIC DAILY PO) Take by mouth.   QUEtiapine (SEROQUEL) 25 MG tablet Take 25 mg by mouth at bedtime.   spironolactone (ALDACTONE) 25 MG tablet Take 50 mg by mouth daily.    tamsulosin (FLOMAX) 0.4 MG CAPS capsule Take 0.4 mg by mouth every morning.   Tiotropium Bromide-Olodaterol (STIOLTO RESPIMAT) 2.5-2.5 MCG/ACT AERS Inhale 2 puffs into the lungs daily.   triamcinolone ointment (KENALOG) 0.1 % Apply 1 application topically as needed.   Turmeric 400 MG CAPS Take 1 capsule by mouth daily.   vitamin E 400 UNIT capsule Take 400 Units by mouth daily.   metoprolol tartrate (LOPRESSOR) 25 MG tablet Take 1 tablet (25 mg  total) by mouth 2 (two) times daily.   No facility-administered encounter medications on file as of 08/26/2021.    Allergies as of 08/26/2021 - Review Complete 08/26/2021  Allergen Reaction Noted   Ace inhibitors Swelling 08/27/2011   Sulfa antibiotics Swelling 08/27/2011    Past Medical History:  Diagnosis Date   Allergic rhinitis    Anxiety    Carpal tunnel syndrome on right    Coronary artery disease    Depression    Diabetes mellitus    DJD (degenerative joint disease)    Dysphagia    GERD (gastroesophageal reflux disease)    Hyperlipidemia    Hypertension    Hypothyroidism    Left inguinal hernia    Mental disorder     Narcolepsy    takes provigil   OSA on CPAP    Osteoporosis    Peripheral neuropathy    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   PTSD (post-traumatic stress disorder)    Weakness of left leg    Wears glasses     Past Surgical History:  Procedure Laterality Date   CARDIAC CATHETERIZATION     CERVICAL DISC SURGERY     x 2   COLONOSCOPY     HIP ARTHROPLASTY     right   INGUINAL HERNIA REPAIR Left 09/06/2019   Procedure: LAPAROSCOPIC LEFT INGUINAL HERNIA WITH MESH;  Surgeon: Greer Pickerel, MD;  Location: [facility];  Service: General;  Laterality: Left;   JOINT REPLACEMENT     KNEE ARTHROPLASTY     bilateral   LUMBAR LAMINECTOMY/DECOMPRESSION MICRODISCECTOMY  08/31/2011   Procedure: LUMBAR LAMINECTOMY/DECOMPRESSION MICRODISCECTOMY;  Surgeon: Otilio Connors, MD;  Location: [facility];  Service: Neurosurgery;  Laterality: N/A;  LumbarThree to Lumbar Five Laminectomy, possible Lumbar Four-Five Diskectomy   SHOULDER SURGERY     left   UPPER GI ENDOSCOPY      Family History  Problem Relation Age of Onset   Diabetes Mother    Hypertension Brother    Hypertension Brother    Cancer Brother     Social History   Socioeconomic History   Marital status: Married    Spouse name: Not on file   Number of children: 2   Years of education: Not on file   Highest education level: Not on file  Occupational History   Not on file  Tobacco Use   Smoking status: Never   Smokeless tobacco: Never  Vaping Use   Vaping Use: Never used  Substance and Sexual Activity   Alcohol use: No   Drug use: No   Sexual activity: Not Currently  Other Topics Concern   Not on file  Social History Narrative   Not on file   Social Determinants of Health   Financial Resource Strain: Not on file  Food Insecurity: Not on file  Transportation Needs: Not on file  Physical Activity: Not on file  Stress: Not on file  Social Connections: Not on file  Intimate  Partner Violence: Not on file    Review of Systems  Constitutional:  Negative for activity change and appetite change.  HENT: Negative.    Eyes: Negative.   Respiratory:  Positive for apnea. Negative for cough, choking and shortness of breath.   Cardiovascular: Negative.   Endocrine: Negative.   Genitourinary: Negative.   Allergic/Immunologic: Negative.   Neurological: Negative.   Hematological: Negative.    Vitals:   08/26/21 0950  BP: 136/72  Pulse: 74  Temp: 98.5 F (36.9 C)  SpO2: 98%     Physical Exam Constitutional:      General: He is not in acute distress.    Appearance: He is well-developed. He is not diaphoretic.  HENT:     Head: Normocephalic and atraumatic.  Eyes:     General:        Right eye: No discharge.        Left eye: No discharge.     Conjunctiva/sclera: Conjunctivae normal.     Pupils: Pupils are equal, round, and reactive to light.  Neck:     Thyroid: No thyromegaly.     Trachea: No tracheal deviation.  Cardiovascular:     Rate and Rhythm: Normal rate and regular rhythm.  Pulmonary:     Effort: Pulmonary effort is normal. No respiratory distress.     Breath sounds: Normal breath sounds. No wheezing.  Abdominal:     General: Bowel sounds are normal. There is no distension.     Palpations: Abdomen is soft.     Tenderness: There is no abdominal tenderness.  Musculoskeletal:        General: No deformity. Normal range of motion.     Cervical back: Normal range of motion and neck supple.  Skin:    General: Skin is warm and dry.     Findings: No erythema.  Neurological:     Mental Status: He is alert and oriented to person, place, and time.     Cranial Nerves: No cranial nerve deficit.     Coordination: Coordination normal.  Psychiatric:        Behavior: Behavior normal.   Data Reviewed: Sleep study from November 2007 noted Compliance data present did review 87% compliance, residual AHI of 9.6 Auto titrating CPAP pressure setting between  10 and 20 with a median pressure of 13.5  Compliance report shows 100% compliance-08/24/2021 Her average sleep of 7 hours 40 minutes Machine set between 10 and 20 95 percentile pressure of 17.6 Residual AHI of 11.9  CT scan of the chest with pleural plaques and rounded atelectasis  Assessment:  1.  Obstructive sleep apnea-adequately treated  2.  Narcolepsy-appears to have a schedule that works for him at present, will respect to his stimulants and also is scheduled nap  3.  PTSD history  4.  Abnormal CT scan of the chest showing rounded atelectasis and pleural plaques -In keeping with exposure to asbestos in the past  5.  Shortness of breath -On inhalers  Plan/Recommendations:  Continue modafinil Continue CPAP use  Graded exercise as tolerated  Encouraged to call with any significant concerns  Follow-up in 6 months   Sherrilyn Rist MD [facility] 08/26/2021, 10:08 AM  CC: Rogers Blocker, MD

## 2021-09-02 ENCOUNTER — Other Ambulatory Visit: Payer: Self-pay

## 2021-09-02 ENCOUNTER — Ambulatory Visit (HOSPITAL_BASED_OUTPATIENT_CLINIC_OR_DEPARTMENT_OTHER): Payer: No Typology Code available for payment source | Attending: Family Medicine | Admitting: Internal Medicine

## 2021-09-02 DIAGNOSIS — G4733 Obstructive sleep apnea (adult) (pediatric): Secondary | ICD-10-CM | POA: Insufficient documentation

## 2021-09-02 DIAGNOSIS — Z79899 Other long term (current) drug therapy: Secondary | ICD-10-CM | POA: Diagnosis not present

## 2021-09-02 DIAGNOSIS — Z7984 Long term (current) use of oral hypoglycemic drugs: Secondary | ICD-10-CM | POA: Diagnosis not present

## 2021-09-02 DIAGNOSIS — Z79890 Hormone replacement therapy (postmenopausal): Secondary | ICD-10-CM | POA: Diagnosis not present

## 2021-09-02 DIAGNOSIS — Z791 Long term (current) use of non-steroidal anti-inflammatories (NSAID): Secondary | ICD-10-CM | POA: Insufficient documentation

## 2021-09-06 DIAGNOSIS — G4733 Obstructive sleep apnea (adult) (pediatric): Secondary | ICD-10-CM

## 2021-09-06 NOTE — Procedures (Signed)
° ° °  Patient Name: [name], [name] Date: 09/02/2021 Gender: Male D.O.B: [birth year] Age (years): 48 Referring Provider: Magdalen Spatz NP Height (inches): 74 Interpreting Physician: Baird Lyons MD, ABSM Weight (lbs): 233 RPSGT: Jorge Ny BMI: 30 MRN: [id number] Neck Size: 16.50  CLINICAL INFORMATION The patient is referred for a BiPAP titration to treat sleep apnea.  Date of NPSG, Split Night or HST:  NPSG 06/28/19  06/28/19   AHI 6.2/ hr, desaturation to 81%, bosy weight 245 lbs  SLEEP STUDY TECHNIQUE As per the AASM Manual for the Scoring of Sleep and Associated Events v2.3 (April 2016) with a hypopnea requiring 4% desaturations.  The channels recorded and monitored were frontal, central and occipital EEG, electrooculogram (EOG), submentalis EMG (chin), nasal and oral airflow, thoracic and abdominal wall motion, anterior tibialis EMG, snore microphone, electrocardiogram, and pulse oximetry. Bilevel positive airway pressure (BPAP) was initiated at the beginning of the study and titrated to treat sleep-disordered breathing.  MEDICATIONS Medications self-administered by patient taken the night of the study : TEMAZEPAM, PAROXETINE, BROMOCRIPTINE, SYNTHROID, METFORMIN, GABAPENTIN, TRIAMTERENE W/HCTZ, HYDRALAZINE, HYDROXYZINE, MELOXICAM, QUETIAPINE FUMARATE, LOSARTAN  RESPIRATORY PARAMETERS Optimal IPAP Pressure (cm):  AHI at Optimal Pressure (/hr) N/A Optimal EPAP Pressure (cm):    Overall Minimal O2 (%): 86.0 Minimal O2 at Optimal Pressure (%): 86.0 SLEEP ARCHITECTURE Start Time: 10:45:13 PM Stop Time: 4:53:16 AM Total Time (min): 368 Total Sleep Time (min): 285 Sleep Latency (min): 17.6 Sleep Efficiency (%): 77.4% REM Latency (min): 161.5 WASO (min): 65.5 Stage N1 (%): 14.9% Stage N2 (%): 67.9% Stage N3 (%): 0.0% Stage R (%): 17.2 Supine (%): 100.00 Arousal Index (/hr): 15.8   CARDIAC DATA The 2 lead EKG demonstrated sinus rhythm. The mean heart rate was 57.9  beats per minute. Other EKG findings include: None.  LEG MOVEMENT DATA The total Periodic Limb Movements of Sleep (PLMS) were 0. The PLMS index was 0.0. A PLMS index of <15 is considered normal in adults.  IMPRESSIONS - BIPAP titration to 18/14 cwp with residual AHI 0/ hr.. - Central sleep apnea was not noted during this titration (CAI = 1.5/h). - Moderate oxygen desaturations were observed during this titration (min O2 = 86.0%). Minimum O2 saturation on BIPAP 18/14 was 94%. - The patient snored with moderate snoring volume. - No cardiac abnormalities were observed during this study. - Clinically significant periodic limb movements were not noted during this study.   DIAGNOSIS - Obstructive Sleep Apnea (G47.33)  RECOMMENDATIONS - Recommend a trial of Auto-BiPAP 10 - 20 cm H2O or fixed BIPAP 18/14. - Sleep hygiene should be reviewed to assess factors that may improve sleep quality. - Note multiple potentially sedating bedtime medications. Also note diuretic at bedtime. - Weight management and regular exercise should be initiated or continued.  [Electronically signed] 09/06/2021 12:14 PM  Baird Lyons MD, [place], American Board of Sleep Medicine   NPI: [id number]                         [place], [place] [facility]  ELECTRONICALLY SIGNED ON:  09/06/2021, 12:05 PM [place] PH: [phone]   FX: [phone] [place]

## 2021-09-21 ENCOUNTER — Telehealth: Payer: Self-pay | Admitting: Pulmonary Disease

## 2021-09-21 DIAGNOSIS — J449 Chronic obstructive pulmonary disease, unspecified: Secondary | ICD-10-CM

## 2021-09-21 MED ORDER — ALBUTEROL SULFATE HFA 108 (90 BASE) MCG/ACT IN AERS: 2.0000 | INHALATION_SPRAY | Freq: Four times a day (QID) | RESPIRATORY_TRACT | 5 refills | Status: DC | PRN

## 2021-09-21 MED ORDER — STIOLTO RESPIMAT 2.5-2.5 MCG/ACT IN AERS: 2.0000 | INHALATION_SPRAY | Freq: Every day | RESPIRATORY_TRACT | 11 refills | Status: DC

## 2021-09-21 NOTE — Telephone Encounter (Signed)
Santiago Glad from [facility] checking on refills for patient. Santiago Glad phone number is [phone]. [facility] fax number is [phone].

## 2021-09-21 NOTE — Telephone Encounter (Signed)
I have sent refills electronically for stiolto and albuterol hfa  Pt aware

## 2021-12-11 ENCOUNTER — Emergency Department (HOSPITAL_COMMUNITY)
Admission: EM | Admit: 2021-12-11 | Discharge: 2021-12-11 | Disposition: A | Discharge status: Home / Self Care | Payer: Medicare PPO | Attending: Emergency Medicine | Admitting: Emergency Medicine

## 2021-12-11 ENCOUNTER — Other Ambulatory Visit: Payer: Self-pay

## 2021-12-11 ENCOUNTER — Encounter (HOSPITAL_COMMUNITY): Payer: Self-pay | Admitting: Emergency Medicine

## 2021-12-11 ENCOUNTER — Emergency Department (HOSPITAL_COMMUNITY): Payer: Medicare PPO

## 2021-12-11 DIAGNOSIS — Z20822 Contact with and (suspected) exposure to covid-19: Secondary | ICD-10-CM | POA: Insufficient documentation

## 2021-12-11 DIAGNOSIS — I10 Essential (primary) hypertension: Secondary | ICD-10-CM | POA: Insufficient documentation

## 2021-12-11 DIAGNOSIS — R0602 Shortness of breath: Secondary | ICD-10-CM | POA: Insufficient documentation

## 2021-12-11 DIAGNOSIS — J449 Chronic obstructive pulmonary disease, unspecified: Secondary | ICD-10-CM | POA: Diagnosis not present

## 2021-12-11 DIAGNOSIS — R059 Cough, unspecified: Secondary | ICD-10-CM | POA: Diagnosis not present

## 2021-12-11 DIAGNOSIS — R0981 Nasal congestion: Secondary | ICD-10-CM | POA: Diagnosis not present

## 2021-12-11 DIAGNOSIS — R062 Wheezing: Secondary | ICD-10-CM | POA: Diagnosis not present

## 2021-12-11 DIAGNOSIS — Z79899 Other long term (current) drug therapy: Secondary | ICD-10-CM | POA: Insufficient documentation

## 2021-12-11 DIAGNOSIS — J441 Chronic obstructive pulmonary disease with (acute) exacerbation: Secondary | ICD-10-CM

## 2021-12-11 DIAGNOSIS — Z7982 Long term (current) use of aspirin: Secondary | ICD-10-CM | POA: Diagnosis not present

## 2021-12-11 LAB — COMPREHENSIVE METABOLIC PANEL
ALT: 33 U/L (ref 0–44)
AST: 48 U/L — ABNORMAL HIGH (ref 15–41)
Albumin: 3.9 g/dL (ref 3.5–5.0)
Alkaline Phosphatase: 77 U/L (ref 38–126)
Anion gap: 8 (ref 5–15)
BUN: 12 mg/dL (ref 8–23)
CO2: 25 mmol/L (ref 22–32)
Calcium: 9.2 mg/dL (ref 8.9–10.3)
Chloride: 103 mmol/L (ref 98–111)
Creatinine, Ser: 1.28 mg/dL — ABNORMAL HIGH (ref 0.61–1.24)
GFR, Estimated: >60 mL/min (ref >60)
Glucose, Bld: 96 mg/dL (ref 70–99)
Potassium: 4 mmol/L (ref 3.5–5.1)
Sodium: 136 mmol/L (ref 135–145)
Total Bilirubin: 0.2 mg/dL — ABNORMAL LOW (ref 0.3–1.2)
Total Protein: 7 g/dL (ref 6.5–8.1)

## 2021-12-11 LAB — CBC
HCT: 41.5 % (ref 39.0–52.0)
Hemoglobin: 12.7 g/dL — ABNORMAL LOW (ref 13.0–17.0)
MCH: 23.6 pg — ABNORMAL LOW (ref 26.0–34.0)
MCHC: 30.6 g/dL (ref 30.0–36.0)
MCV: 77.3 fL — ABNORMAL LOW (ref 80.0–100.0)
Platelets: 273 10*3/uL (ref 150–400)
RBC: 5.37 MIL/uL (ref 4.22–5.81)
RDW: 15.2 % (ref 11.5–15.5)
WBC: 8.4 10*3/uL (ref 4.0–10.5)
nRBC: 0 % (ref 0.0–0.2)

## 2021-12-11 LAB — RESP PANEL BY RT-PCR (FLU A&B, COVID) ARPGX2
Influenza A by PCR: NEGATIVE
Influenza B by PCR: NEGATIVE
SARS Coronavirus 2 by RT PCR: NEGATIVE

## 2021-12-11 MED ORDER — IPRATROPIUM BROMIDE 0.02 % IN SOLN
0.5000 mg | Freq: Once | RESPIRATORY_TRACT | Status: AC
  Administered 2021-12-11: 0.5 mg via RESPIRATORY_TRACT
  Filled 2021-12-11: qty 2.5

## 2021-12-11 MED ORDER — IPRATROPIUM BROMIDE 0.02 % IN SOLN
0.5000 mg | Freq: Once | RESPIRATORY_TRACT | Status: AC
Start: 2021-12-11 — End: 2021-12-11
  Administered 2021-12-11: 0.5 mg via RESPIRATORY_TRACT
  Filled 2021-12-11: qty 2.5

## 2021-12-11 MED ORDER — ALBUTEROL SULFATE (2.5 MG/3ML) 0.083% IN NEBU
5.0000 mg | INHALATION_SOLUTION | Freq: Once | RESPIRATORY_TRACT | Status: AC
  Administered 2021-12-11: 5 mg via RESPIRATORY_TRACT
  Filled 2021-12-11: qty 6

## 2021-12-11 MED ORDER — PREDNISONE 20 MG PO TABS: 60.0000 mg | ORAL_TABLET | Freq: Every day | ORAL | 0 refills | Status: DC

## 2021-12-11 MED ORDER — ALBUTEROL SULFATE HFA 108 (90 BASE) MCG/ACT IN AERS: 2.0000 | INHALATION_SPRAY | RESPIRATORY_TRACT | 1 refills | Status: DC | PRN

## 2021-12-11 MED ORDER — METHYLPREDNISOLONE SODIUM SUCC 125 MG IJ SOLR
125.0000 mg | Freq: Once | INTRAMUSCULAR | Status: AC
  Administered 2021-12-11: 125 mg via INTRAVENOUS
  Filled 2021-12-11: qty 2

## 2021-12-11 NOTE — ED Triage Notes (Signed)
Pt BIB [facility] from home, c/o increased shortness of breath with productive cough. Hx COPD, wife reports pt had a respiratory infection earlier in the month and was treated with abx. Inspiratory and expiratory wheezes, given 2 duonebs, '125mg'$  solumedrol and 2g mag by EMS.  ?

## 2021-12-11 NOTE — Discharge Instructions (Addendum)
It was our pleasure to provide your ER care today - we hope that you feel better. ? ?Take prednisone as prescribed. Use albuterol treatments as need. ? ?Follow up with your doctor/pulmonary doctor in the coming week if symptoms fail to improve/resolve.  ? ?Return to ER if worse, new symptoms, fevers, chest pain, increased trouble breathing, or other concern.  ?

## 2021-12-11 NOTE — ED Provider Notes (Signed)
?[facility] ?Provider Note ? ? ?CSN: [id number] ?Arrival date & time: 12/11/21  2831 ? ?  ? ?History ? ?Chief Complaint  ?Patient presents with  ? Shortness of Breath  ? ? ?[name] is a 71 y.o. male. ? ?Pt with hx copd, with recent upper resp symptoms, prod cough, congestion - recently completed antibiotic therapy for same - pt states persistent cough and wheezing, was sob this evening. No chest pain or discomfort. Pt limited historian - level 5 caveat. Pt denies leg pain or swelling. No pleuritic pain. No current prednisone/oral steroid used. Recently completed erythromycin abx.  ? ?The history is provided by the patient, the EMS personnel and the spouse. The history is limited by the condition of the patient.  ?Shortness of Breath ?Associated symptoms: cough and wheezing   ?Associated symptoms: no abdominal pain, no chest pain, no fever, no headaches, no neck pain, no rash, no sore throat and no vomiting   ? ?  ? ?Home Medications ?Prior to Admission medications   ?Medication Sig Start Date End Date Taking? Authorizing Provider  ?albuterol (VENTOLIN HFA) 108 (90 Base) MCG/ACT inhaler Inhale 2 puffs into the lungs every 6 (six) hours as needed for wheezing or shortness of breath. 09/21/21   Laurin Coder, MD  ?amLODipine (NORVASC) 10 MG tablet every morning.  01/09/19   [provider]  ?aspirin EC 81 MG tablet Take 81 mg by mouth daily. Swallow whole.    [provider]  ?b complex vitamins capsule Take 1 capsule by mouth daily.    [provider]  ?carbamide peroxide (DEBROX) 6.5 % OTIC solution Place 5 drops into both ears as needed.    [provider]  ?Cholecalciferol (VITAMIN D3) 1000 UNITS CAPS Take by mouth every morning.      [provider]  ?fexofenadine (ALLEGRA) 180 MG tablet Take 180 mg by mouth daily.    [provider]  ?fluticasone (FLONASE) 50 MCG/ACT nasal spray Place 2 sprays into both nostrils  daily.    [provider]  ?gabapentin (NEURONTIN) 400 MG capsule Take 300 mg by mouth 3 (three) times daily.    [provider]  ?hydrALAZINE (APRESOLINE) 50 MG tablet Take 50 mg by mouth 3 (three) times daily.    [provider]  ?hydrOXYzine (ATARAX/VISTARIL) 10 MG tablet Take 2 tablets by mouth at bedtime. 06/11/21   [provider]  ?losartan (COZAAR) 100 MG tablet Take 100 mg by mouth every evening.    [provider]  ?melatonin 3 MG TABS tablet Take 3 mg by mouth at bedtime.    [provider]  ?meloxicam (MOBIC) 7.5 MG tablet Take 7.5 mg by mouth daily.    [provider]  ?metFORMIN (GLUCOPHAGE) 500 MG tablet Take 500 mg by mouth 2 (two) times daily.    [provider]  ?metoprolol tartrate (LOPRESSOR) 25 MG tablet Take 1 tablet (25 mg total) by mouth 2 (two) times daily. 05/22/21 06/21/21  Tolia, Sunit, DO  ?modafinil (PROVIGIL) 100 MG tablet Take 100 mg by mouth daily.    [provider]  ?omeprazole (PRILOSEC OTC) 20 MG tablet Take 1 tablet (20 mg total) by mouth daily. 01/30/20   Willia Craze, NP  ?PARoxetine (PAXIL) 40 MG tablet Take 40 mg by mouth every morning.    [provider]  ?pravastatin (PRAVACHOL) 20 MG tablet Take 20 mg by mouth at bedtime.    [provider]  ?  Probiotic Product (PROBIOTIC DAILY PO) Take by mouth.    [provider]  ?QUEtiapine (SEROQUEL) 25 MG tablet Take 25 mg by mouth at bedtime.    [provider]  ?spironolactone (ALDACTONE) 25 MG tablet Take 50 mg by mouth daily.     [provider]  ?tamsulosin (FLOMAX) 0.4 MG CAPS capsule Take 0.4 mg by mouth every morning.    [provider]  ?Tiotropium Bromide-Olodaterol (STIOLTO RESPIMAT) 2.5-2.5 MCG/ACT AERS Inhale 2 puffs into the lungs daily. 09/21/21   Sherrilyn Rist A, MD  ?triamcinolone ointment (KENALOG) 0.1 % Apply 1 application topically as needed.    [provider]   ?Turmeric 400 MG CAPS Take 1 capsule by mouth daily.    [provider]  ?vitamin E 400 UNIT capsule Take 400 Units by mouth daily.    [provider]  ?   ? ?Allergies    ?Ace inhibitors and Sulfa antibiotics   ? ?Review of Systems   ?Review of Systems  ?Constitutional:  Negative for chills and fever.  ?HENT:  Positive for congestion. Negative for sore throat.   ?Eyes:  Negative for redness.  ?Respiratory:  Positive for cough, shortness of breath and wheezing.   ?Cardiovascular:  Negative for chest pain and leg swelling.  ?Gastrointestinal:  Negative for abdominal pain, diarrhea and vomiting.  ?Genitourinary:  Negative for flank pain.  ?Musculoskeletal:  Negative for back pain and neck pain.  ?Skin:  Negative for rash.  ?Neurological:  Negative for headaches.  ?Hematological:  Does not bruise/bleed easily.  ?Psychiatric/Behavioral:  Negative for confusion.   ? ?Physical Exam ?Updated Vital Signs ?BP (!) 159/75   Pulse 94   Temp (!) 97 ?F (36.1 ?C) (Temporal)   Resp 13   SpO2 96%  ?Physical Exam ?Vitals and nursing note reviewed.  ?Constitutional:   ?   Appearance: Normal appearance. He is well-developed.  ?HENT:  ?   Head: Atraumatic.  ?   Nose: Nose normal.  ?   Mouth/Throat:  ?   Mouth: Mucous membranes are moist.  ?   Pharynx: Oropharynx is clear.  ?Eyes:  ?   General: No scleral icterus. ?   Conjunctiva/sclera: Conjunctivae normal.  ?Neck:  ?   Trachea: No tracheal deviation.  ?Cardiovascular:  ?   Rate and Rhythm: Normal rate and regular rhythm.  ?   Pulses: Normal pulses.  ?   Heart sounds: Normal heart sounds. No murmur heard. ?  No friction rub. No gallop.  ?Pulmonary:  ?   Effort: Pulmonary effort is normal. No accessory muscle usage or respiratory distress.  ?   Breath sounds: Wheezing present.  ?Abdominal:  ?   General: Bowel sounds are normal. There is no distension.  ?   Palpations: Abdomen is soft.  ?   Tenderness: There is no abdominal tenderness.  ?Genitourinary: ?   Comments:  No cva tenderness. ?Musculoskeletal:     ?   General: No swelling or tenderness.  ?   Cervical back: Normal range of motion and neck supple. No rigidity.  ?Skin: ?   General: Skin is warm and dry.  ?   Findings: No rash.  ?Neurological:  ?   Mental Status: He is alert.  ?   Comments: Alert, speech clear.   ?Psychiatric:     ?   Mood and Affect: Mood normal.  ? ? ?ED Results / Procedures / Treatments   ?Labs ?(all labs ordered are listed, but only abnormal results are displayed) ?  Results for orders placed or performed during the hospital encounter of 12/11/21  ?Resp Panel by RT-PCR (Flu A&B, Covid) Nasopharyngeal Swab  ? Specimen: Nasopharyngeal Swab; Nasopharyngeal(NP) swabs in vial transport medium  ?Result Value Ref Range  ? SARS Coronavirus 2 by RT PCR NEGATIVE NEGATIVE  ? Influenza A by PCR NEGATIVE NEGATIVE  ? Influenza B by PCR NEGATIVE NEGATIVE  ?CBC  ?Result Value Ref Range  ? WBC 8.4 4.0 - 10.5 K/uL  ? RBC 5.37 4.22 - 5.81 MIL/uL  ? Hemoglobin 12.7 (L) 13.0 - 17.0 g/dL  ? HCT 41.5 39.0 - 52.0 %  ? MCV 77.3 (L) 80.0 - 100.0 fL  ? MCH 23.6 (L) 26.0 - 34.0 pg  ? MCHC 30.6 30.0 - 36.0 g/dL  ? RDW 15.2 11.5 - 15.5 %  ? Platelets 273 150 - 400 K/uL  ? nRBC 0.0 0.0 - 0.2 %  ?Comprehensive metabolic panel  ?Result Value Ref Range  ? Sodium 136 135 - 145 mmol/L  ? Potassium 4.0 3.5 - 5.1 mmol/L  ? Chloride 103 98 - 111 mmol/L  ? CO2 25 22 - 32 mmol/L  ? Glucose, Bld 96 70 - 99 mg/dL  ? BUN 12 8 - 23 mg/dL  ? Creatinine, Ser 1.28 (H) 0.61 - 1.24 mg/dL  ? Calcium 9.2 8.9 - 10.3 mg/dL  ? Total Protein 7.0 6.5 - 8.1 g/dL  ? Albumin 3.9 3.5 - 5.0 g/dL  ? AST 48 (H) 15 - 41 U/L  ? ALT 33 0 - 44 U/L  ? Alkaline Phosphatase 77 38 - 126 U/L  ? Total Bilirubin 0.2 (L) 0.3 - 1.2 mg/dL  ? GFR, Estimated >60 >60 mL/min  ? Anion gap 8 5 - 15  ? ? ? ?EKG ?EKG Interpretation ? ?Date/Time:  Friday December 11 2021 02:44:11 EDT ?Ventricular Rate:  93 ?PR Interval:  159 ?QRS Duration: 95 ?QT Interval:  372 ?QTC Calculation: 463 ?R  Axis:   -33 ?Text Interpretation: Sinus rhythm Left axis deviation Confirmed by Lajean Saver [phone]) on 12/11/2021 2:45:59 AM ? ?Radiology ?DG Chest Port 1 View ? ?Result Date: 12/11/2021 ?CLINICAL DATA:  Shortness of

## 2021-12-14 ENCOUNTER — Telehealth: Payer: Self-pay | Admitting: Pulmonary Disease

## 2021-12-17 ENCOUNTER — Ambulatory Visit (INDEPENDENT_AMBULATORY_CARE_PROVIDER_SITE_OTHER): Payer: No Typology Code available for payment source | Admitting: Pulmonary Disease

## 2021-12-17 ENCOUNTER — Encounter: Payer: Self-pay | Admitting: Pulmonary Disease

## 2021-12-17 VITALS — BP 122/72 | HR 65 | Ht 73.5 in | Wt 234.2 lb

## 2021-12-17 DIAGNOSIS — J449 Chronic obstructive pulmonary disease, unspecified: Secondary | ICD-10-CM

## 2021-12-17 NOTE — Patient Instructions (Addendum)
I will see you back in 6 months ? ?History of chronic obstructive pulmonary disease , with recent exacerbation ? ?-Continue Stiolto and albuterol as needed ? ?Prescription for nebulizer unit, to be used with mask ? ?Call us with significant concerns ?

## 2021-12-17 NOTE — Progress Notes (Signed)
? ?      ?[name]    [id number]    [birth year] ? ?Primary Care Physician:Dean, Carolann Littler, MD ? ?Referring Physician: Rogers Blocker, MD ?[address] ?[address] ?[place],  [place] [address] ? ?Chief complaint:   ?Patient with a history of narcolepsy, obstructive sleep apnea ?Compliant with CPAP, compliant with stimulant medications ?Recent ED evaluation for COPD exacerbation ? ?HPI: ? ?History of narcolepsy ?History of sleep apnea for which she was on CPAP ?-This was recently switched to a BiPAP which is tolerating well ? ?Requires modafinil to stay alert during the day ? ?Still does take regular daytime naps ? ?Following most activities, he will take a rest and may sleep for about 5 to 10 minutes to get himself rejuvenated ? ?Recently in the emergency department for shortness of breath, cough with mucus production ?-He was treated for COPD exacerbation ?-Symptoms are better ?-He still does have a cough with sputum production but not feeling on the weather at present ? ?He does have pleural plaques and rounded atelectasis on CT scan ? ?His health has been generally well ? ?Recently started on inhalers for shortness of breath ? ?History of PTSD ? ?Exposures: No significant exposures ?Smoking history: Never smoker ? ? ?Outpatient Encounter Medications as of 12/17/2021  ?Medication Sig  ? albuterol (PROVENTIL) (2.5 MG/3ML) 0.083% nebulizer solution Take 2.5 mg by nebulization every 6 (six) hours as needed for wheezing or shortness of breath.  ? albuterol (VENTOLIN HFA) 108 (90 Base) MCG/ACT inhaler Inhale 2 puffs into the lungs every 6 (six) hours as needed for wheezing or shortness of breath.  ? albuterol (VENTOLIN HFA) 108 (90 Base) MCG/ACT inhaler Inhale 2 puffs into the lungs every 4 (four) hours as needed for wheezing or shortness of breath.  ? amLODipine (NORVASC) 10 MG tablet Take 10 mg by mouth daily.  ? aspirin EC 81 MG tablet Take 81 mg by mouth daily. Swallow whole.  ? b complex vitamins capsule  Take 1 capsule by mouth daily.  ? carbamide peroxide (DEBROX) 6.5 % OTIC solution Place 5 drops into both ears as needed (ear wax).  ? Cholecalciferol (VITAMIN D3) 1000 UNITS CAPS Take 1,000 Units by mouth every morning.  ? FERGON 240 (27 Fe) MG tablet Take 240 mg by mouth daily.  ? fexofenadine (ALLEGRA) 180 MG tablet Take 180 mg by mouth daily.  ? fluticasone (FLONASE) 50 MCG/ACT nasal spray Place 1 spray into both nostrils in the morning and at bedtime.  ? gabapentin (NEURONTIN) 400 MG capsule Take 400 mg by mouth 3 (three) times daily.  ? hydrALAZINE (APRESOLINE) 50 MG tablet Take 50 mg by mouth 3 (three) times daily.  ? hydrOXYzine (ATARAX/VISTARIL) 10 MG tablet Take 20 mg by mouth at bedtime.  ? losartan (COZAAR) 100 MG tablet Take 100 mg by mouth every evening.  ? melatonin 3 MG TABS tablet Take 3 mg by mouth at bedtime.  ? meloxicam (MOBIC) 7.5 MG tablet Take 7.5 mg by mouth daily.  ? metFORMIN (GLUCOPHAGE) 500 MG tablet Take 500 mg by mouth daily with breakfast.  ? metoprolol tartrate (LOPRESSOR) 25 MG tablet Take 1 tablet (25 mg total) by mouth 2 (two) times daily. (Patient not taking: Reported on 12/11/2021)  ? modafinil (PROVIGIL) 100 MG tablet Take 100-200 mg by mouth See admin instructions. 200 mg in the morning  ?100 mg  at nooon  ? NON FORMULARY BIPAP at bedtime  ? omeprazole (PRILOSEC OTC) 20 MG tablet Take 1  tablet (20 mg total) by mouth daily.  ? PARoxetine (PAXIL) 40 MG tablet Take 40 mg by mouth at bedtime.  ? pravastatin (PRAVACHOL) 40 MG tablet Take 40 mg by mouth at bedtime.  ? predniSONE (DELTASONE) 20 MG tablet Take 3 tablets (60 mg total) by mouth daily.  ? Probiotic Product (PROBIOTIC DAILY PO) Take 1 capsule by mouth daily.  ? QUEtiapine (SEROQUEL) 25 MG tablet Take 50 mg by mouth at bedtime.  ? spironolactone (ALDACTONE) 25 MG tablet Take 50 mg by mouth daily.   ? tamsulosin (FLOMAX) 0.4 MG CAPS capsule Take 0.4 mg by mouth every morning.  ? Tiotropium Bromide-Olodaterol (STIOLTO  RESPIMAT) 2.5-2.5 MCG/ACT AERS Inhale 2 puffs into the lungs daily.  ? triamcinolone ointment (KENALOG) 0.1 % Apply 1 application. topically as needed (rash).  ? Turmeric 400 MG CAPS Take 400 mg by mouth daily.  ? vitamin E 400 UNIT capsule Take 400 Units by mouth daily.  ? ?No facility-administered encounter medications on file as of 12/17/2021.  ? ? ?Allergies as of 12/17/2021 - Review Complete 12/11/2021  ?Allergen Reaction Noted  ? Ace inhibitors Swelling 08/27/2011  ? Sulfa antibiotics Swelling 08/27/2011  ? ? ?Past Medical History:  ?Diagnosis Date  ? Allergic rhinitis   ? Anxiety   ? Carpal tunnel syndrome on right   ? Coronary artery disease   ? Depression   ? Diabetes mellitus   ? DJD (degenerative joint disease)   ? Dysphagia   ? GERD (gastroesophageal reflux disease)   ? Hyperlipidemia   ? Hypertension   ? Hypothyroidism   ? Left inguinal hernia   ? Mental disorder   ? Narcolepsy   ? takes provigil  ? OSA on CPAP   ? Osteoporosis   ? Peripheral neuropathy   ? 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 PTSD (post-traumatic stress disorder)   ? Weakness of left leg   ? Wears glasses   ? ? ?Past Surgical History:  ?Procedure Laterality Date  ? CARDIAC CATHETERIZATION    ? CERVICAL DISC SURGERY    ? x 2  ? COLONOSCOPY    ? HIP ARTHROPLASTY    ? right  ? INGUINAL HERNIA REPAIR Left 09/06/2019  ? Procedure: LAPAROSCOPIC LEFT INGUINAL HERNIA WITH MESH;  Surgeon: Greer Pickerel, MD;  Location: [facility];  Service: General;  Laterality: Left;  ? JOINT REPLACEMENT    ? KNEE ARTHROPLASTY    ? bilateral  ? LUMBAR LAMINECTOMY/DECOMPRESSION MICRODISCECTOMY  08/31/2011  ? Procedure: LUMBAR LAMINECTOMY/DECOMPRESSION MICRODISCECTOMY;  Surgeon: Otilio Connors, MD;  Location: [facility];  Service: Neurosurgery;  Laterality: N/A;  LumbarThree to Lumbar Five Laminectomy, possible Lumbar Four-Five Diskectomy  ? SHOULDER SURGERY    ? left  ? UPPER GI ENDOSCOPY    ? ? ?Family History   ?Problem Relation Age of Onset  ? Diabetes Mother   ? Hypertension Brother   ? Hypertension Brother   ? Cancer Brother   ? ? ?Social History  ? ?Socioeconomic History  ? Marital status: Married  ?  Spouse name: Not on file  ? Number of children: 2  ? Years of education: Not on file  ? Highest education level: Not on file  ?Occupational History  ? Not on file  ?Tobacco Use  ? Smoking status: Never  ? Smokeless tobacco: Never  ?Vaping Use  ? Vaping Use: Never used  ?Substance and Sexual Activity  ? Alcohol use: No  ?  Drug use: No  ? Sexual activity: Not Currently  ?Other Topics Concern  ? Not on file  ?Social History Narrative  ? Not on file  ? ?Social Determinants of Health  ? ?Financial Resource Strain: Not on file  ?Food Insecurity: Not on file  ?Transportation Needs: Not on file  ?Physical Activity: Not on file  ?Stress: Not on file  ?Social Connections: Not on file  ?Intimate Partner Violence: Not on file  ? ?Review of Systems  ?Constitutional:  Negative for activity change and appetite change.  ?HENT: Negative.    ?Eyes: Negative.   ?Respiratory:  Positive for apnea. Negative for cough, choking and shortness of breath.   ?Cardiovascular: Negative.   ?Endocrine: Negative.   ?Genitourinary: Negative.   ?Allergic/Immunologic: Negative.   ?Neurological: Negative.   ?Hematological: Negative.   ?Psychiatric/Behavioral:  Positive for sleep disturbance.   ? ?There were no vitals filed for this visit. ? ?Physical Exam ?Constitutional:   ?   General: He is not in acute distress. ?   Appearance: He is well-developed. He is not diaphoretic.  ?HENT:  ?   Head: Normocephalic and atraumatic.  ?Neck:  ?   Thyroid: No thyromegaly.  ?   Trachea: No tracheal deviation.  ?Cardiovascular:  ?   Rate and Rhythm: Normal rate and regular rhythm.  ?Pulmonary:  ?   Effort: Pulmonary effort is normal. No respiratory distress.  ?   Breath sounds: Normal breath sounds. No wheezing.  ?Abdominal:  ?   General: Bowel sounds are normal. There  is no distension.  ?   Palpations: Abdomen is soft.  ?   Tenderness: There is no abdominal tenderness.  ?Musculoskeletal:     ?   General: No deformity. Normal range of motion.  ?   Cervical back: Normal range of m

## 2021-12-18 ENCOUNTER — Telehealth: Payer: Self-pay | Admitting: Pulmonary Disease

## 2021-12-21 NOTE — Telephone Encounter (Signed)
I spoke with the pt  ?She states order for neb needs to be cancelled with [facility]  ?[phone]- must put "stock pack" somewhere on the order  ?Sending to [facility] to correct  ?

## 2021-12-23 NOTE — Telephone Encounter (Signed)
I faxed and emailed the referral to the [place]. Nothing further needed at this time.   ?

## 2021-12-31 ENCOUNTER — Telehealth: Payer: Self-pay | Admitting: Pulmonary Disease

## 2022-01-01 NOTE — Telephone Encounter (Signed)
Called and spoke to wife and she is wanting to know if her husband can go back to having his   Albuterol neb 4 times a day instead of twice a day. She states in the past when his PCP did it 4 times a day he was better with his breathing.  She also wants to know any recommendations of anything he can take to help keep him breathing well and not full of congestion. I stated mucinex but I wanted to clear it with you first sir.   Dr Jenetta Downer please advise

## 2022-01-01 NOTE — Telephone Encounter (Signed)
[name] wife is returning phone call. [facility] phone number is [phone].

## 2022-01-01 NOTE — Telephone Encounter (Signed)
Called and left voicemail for patient to call office back in regards to wanting dose change for albuterol.

## 2022-01-05 MED ORDER — ALBUTEROL SULFATE (2.5 MG/3ML) 0.083% IN NEBU: 2.5000 mg | INHALATION_SOLUTION | RESPIRATORY_TRACT | 1 refills | Status: DC | PRN

## 2022-01-05 MED ORDER — PREDNISONE 10 MG PO TABS: ORAL_TABLET | ORAL | 0 refills | Status: DC

## 2022-01-05 MED ORDER — DOXYCYCLINE HYCLATE 100 MG PO TABS
100.0000 mg | ORAL_TABLET | Freq: Two times a day (BID) | ORAL | 0 refills | Status: DC
Start: 2022-01-05 — End: 2022-03-02

## 2022-01-05 NOTE — Telephone Encounter (Signed)
He sounds like he is having a AECOPD. Please send prednisone taper. 4 tabs for 2 days, then 3 tabs for 2 days, 2 tabs for 2 days, then 1 tab for 2 days, then stop. Take in AM with food. Doxycycline 1 tab Twice daily for 7 days. Take with food. Avoid direct sun exposure/wear sunscreen while taking as it can cause hyperpigmentation and/or sun burns. If no improvement, needs OV ASAP. Thanks.

## 2022-01-05 NOTE — Telephone Encounter (Signed)
Called and spoke with patient's wife. She verbalized understanding. RXs have been sent to his pharmacy.   Nothing further needed at time of call.

## 2022-01-05 NOTE — Telephone Encounter (Signed)
Called and spoke with patient's wife [name]. I apologized for her not receiving a response about the albuterol. She stated that the patient went ahead and increased the albuterol neb treatments to 4 times a day and did not notice some relief for 2 days but then he started coughing. He has a productive cough with thick yellowish greenish phlegm. Increased wheezing and SOB. Increased fatigue. Denied any fever or body aches. Also denied being around anyone who has been sick recently.   I advised her that I would go ahead and change the albuterol RX for him. I have sent this to the [facility].   She wanted to know if anything else could be sent in for him, especially the productive cough. Pharmacy is [facility] on [address].   Katie, can you please advise since AO is off but you have seen him before in the past? Thanks!

## 2022-01-26 ENCOUNTER — Encounter: Payer: Self-pay | Admitting: Emergency Medicine

## 2022-01-26 ENCOUNTER — Ambulatory Visit (INDEPENDENT_AMBULATORY_CARE_PROVIDER_SITE_OTHER): Payer: No Typology Code available for payment source | Admitting: Emergency Medicine

## 2022-01-26 DIAGNOSIS — J449 Chronic obstructive pulmonary disease, unspecified: Secondary | ICD-10-CM

## 2022-01-26 MED ORDER — AZITHROMYCIN 250 MG PO TABS: ORAL_TABLET | ORAL | 0 refills | Status: DC

## 2022-01-26 MED ORDER — PREDNISONE 10 MG PO TABS: ORAL_TABLET | ORAL | 0 refills | Status: DC

## 2022-01-26 NOTE — Patient Instructions (Signed)
Please take prednisone as directed until completely gone. Please take azithromycin as directed until completely gone. Continue your Stiolto 2 puffs once daily.  It may be reasonable to discuss with Dr. Ander Slade changing this medication in the future if you continue to have flaring symptoms. Use your albuterol either 1 nebulizer treatment or 2 puffs up to every 4 hours if needed for shortness of breath, chest tightness, wheezing. Continue your Allegra and nasal spray as you have been taking it. Follow with Dr. Ander Slade in 1 month.

## 2022-01-26 NOTE — Addendum Note (Signed)
Addended by: Gavin Potters R on: 01/26/2022 11:32 AM   Modules accepted: Orders

## 2022-01-26 NOTE — Progress Notes (Signed)
Subjective:    Patient ID: [name], male    DOB: [birth year], 71 y.o.   MRN: [id number]  HPI 71 year old gentleman who is followed by Dr. Ander Slade in our office for OSA, narcolepsy (on BiPAP), COPD on Stiolto.  He also has pleural plaques on chest imaging. He presents today for an acute visit, reports that he was well until 3-4 days ago. He has developed fatigue, then cough with green mucous, some wheeze and sob. He is on Stiolto, using albuterol nebs frequently through the day.    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   PTSD (post-traumatic stress disorder)    Weakness of left leg    Wears glasses      Family History  Problem Relation Age of Onset   Diabetes Mother    Hypertension Brother    Hypertension Brother    Cancer Brother      Social History   Socioeconomic History   Marital status: Married    Spouse name: Not on file   Number of children: 2   Years of education: Not on file   Highest education level: Not on file  Occupational History   Not on file  Tobacco Use   Smoking status: Never   Smokeless tobacco: Never  Vaping Use   Vaping Use: Never used  Substance and Sexual Activity   Alcohol use: No   Drug use: No   Sexual activity: Not Currently  Other Topics Concern   Not on file  Social History Narrative   Not on file   Social Determinants of Health   Financial Resource Strain: Not on file  Food Insecurity: Not on file   Transportation Needs: Not on file  Physical Activity: Not on file  Stress: Not on file  Social Connections: Not on file  Intimate Partner Violence: Not on file     Allergies  Allergen Reactions   Ace Inhibitors Swelling   Sulfa Antibiotics Swelling     Outpatient Medications Prior to Visit  Medication Sig Dispense Refill   albuterol (PROVENTIL) (2.5 MG/3ML) 0.083% nebulizer solution Take 3 mLs (2.5 mg total) by nebulization every 4 (four) hours as needed for wheezing or shortness of breath. 1080 mL 1   albuterol (VENTOLIN HFA) 108 (90 Base) MCG/ACT inhaler Inhale 2 puffs into the lungs every 6 (six) hours as needed for wheezing or shortness of breath. 8 g 5   amLODipine (NORVASC) 10 MG tablet Take 10 mg by mouth daily.     aspirin EC 81 MG tablet Take 81 mg by mouth daily. Swallow whole.     b complex vitamins capsule Take 1 capsule by mouth daily.     carbamide peroxide (DEBROX) 6.5 % OTIC solution Place 5 drops into both ears as needed (ear wax).     Cholecalciferol (VITAMIN D3) 1000  UNITS CAPS Take 1,000 Units by mouth every morning.     doxycycline (VIBRA-TABS) 100 MG tablet Take 1 tablet (100 mg total) by mouth 2 (two) times daily. 14 tablet 0   FERGON 240 (27 Fe) MG tablet Take 240 mg by mouth daily.     fexofenadine (ALLEGRA) 180 MG tablet Take 180 mg by mouth daily.     fluticasone (FLONASE) 50 MCG/ACT nasal spray Place 1 spray into both nostrils in the morning and at bedtime.     gabapentin (NEURONTIN) 400 MG capsule Take 400 mg by mouth 3 (three) times daily.     hydrALAZINE (APRESOLINE) 50 MG tablet Take 50 mg by mouth 3 (three) times daily.     hydrOXYzine (ATARAX/VISTARIL) 10 MG tablet Take 20 mg by mouth at bedtime.     losartan (COZAAR) 100 MG tablet Take 100 mg by mouth every evening.     melatonin 3 MG TABS tablet Take 3 mg by mouth at bedtime.     meloxicam (MOBIC) 7.5 MG tablet Take 7.5 mg by mouth daily.     metFORMIN (GLUCOPHAGE) 500 MG tablet Take 500 mg by  mouth daily with breakfast.     modafinil (PROVIGIL) 100 MG tablet Take 100-200 mg by mouth See admin instructions. 200 mg in the morning  100 mg  at nooon     NON FORMULARY BIPAP at bedtime     omeprazole (PRILOSEC OTC) 20 MG tablet Take 1 tablet (20 mg total) by mouth daily. 30 tablet 2   PARoxetine (PAXIL) 40 MG tablet Take 40 mg by mouth at bedtime.     pravastatin (PRAVACHOL) 40 MG tablet Take 40 mg by mouth at bedtime.     predniSONE (DELTASONE) 10 MG tablet Take 4 tabs for 2 days, then 3 tabs for 2 days, 2 tabs for 2 days, then 1 tab for 2 days, then stop. 20 tablet 0   predniSONE (DELTASONE) 20 MG tablet Take 3 tablets (60 mg total) by mouth daily. 15 tablet 0   Probiotic Product (PROBIOTIC DAILY PO) Take 1 capsule by mouth daily.     QUEtiapine (SEROQUEL) 25 MG tablet Take 50 mg by mouth at bedtime.     spironolactone (ALDACTONE) 25 MG tablet Take 50 mg by mouth daily.      tamsulosin (FLOMAX) 0.4 MG CAPS capsule Take 0.4 mg by mouth every morning.     Tiotropium Bromide-Olodaterol (STIOLTO RESPIMAT) 2.5-2.5 MCG/ACT AERS Inhale 2 puffs into the lungs daily. 1 each 11   triamcinolone ointment (KENALOG) 0.1 % Apply 1 application. topically as needed (rash).     Turmeric 400 MG CAPS Take 400 mg by mouth daily.     vitamin E 400 UNIT capsule Take 400 Units by mouth daily.     No facility-administered medications prior to visit.         Objective:   Physical Exam Vitals:   01/26/22 1111  BP: (!) 154/72  Pulse: 67  Temp: 98.1 F (36.7 C)  TempSrc: Oral  SpO2: 99%  Weight: 240 lb 12.8 oz (109.2 kg)  Height: 6' 1.5" (1.867 m)    Gen: Pleasant, well-nourished, in no distress,  normal affect  ENT: No lesions,  mouth clear,  oropharynx clear, no postnasal drip  Neck: No JVD, low pitched inspiratory stridor  Lungs: No use of accessory muscles, no crackles or wheezing on normal respiration, bilateral inspiratory and expiratory noise, some wheezing and referred upper airway  noise  Cardiovascular: RRR, heart sounds normal, no  murmur or gallops, no peripheral edema  Musculoskeletal: No deformities, no cyanosis or clubbing  Neuro: alert, awake, non focal  Skin: Warm, no lesions or rash      Assessment & Plan:  COPD (chronic obstructive pulmonary disease) ([place]) With acute flaring symptoms.  He has required multiple rounds of prednisone and antibiotics over the last 6 months to deal with flares.  Question whether he needs an ICS added to his LABA/LAMA.  Seems to be addressing his exacerbating factors particularly chronic rhinitis on antihistamine and nasal steroid.  Please take prednisone as directed until completely gone. Please take azithromycin as directed until completely gone. Continue your Stiolto 2 puffs once daily.  It may be reasonable to discuss with Dr. Ander Slade changing this medication in the future if you continue to have flaring symptoms. Use your albuterol either 1 nebulizer treatment or 2 puffs up to every 4 hours if needed for shortness of breath, chest tightness, wheezing. Continue your Allegra and nasal spray as you have been taking it. Follow with Dr. Ander Slade in 1 month.  Baltazar Apo, MD, PhD 01/26/2022, 11:28 AM [facility] [phone] or if no answer before 7:00PM call [phone] For any issues after 7:00PM please call eLink [phone]

## 2022-01-26 NOTE — Assessment & Plan Note (Signed)
With acute flaring symptoms.  He has required multiple rounds of prednisone and antibiotics over the last 6 months to deal with flares.  Question whether he needs an ICS added to his LABA/LAMA.  Seems to be addressing his exacerbating factors particularly chronic rhinitis on antihistamine and nasal steroid.  Please take prednisone as directed until completely gone. Please take azithromycin as directed until completely gone. Continue your Stiolto 2 puffs once daily.  It may be reasonable to discuss with Dr. Ander Slade changing this medication in the future if you continue to have flaring symptoms. Use your albuterol either 1 nebulizer treatment or 2 puffs up to every 4 hours if needed for shortness of breath, chest tightness, wheezing. Continue your Allegra and nasal spray as you have been taking it. Follow with Dr. Ander Slade in 1 month.

## 2022-02-08 ENCOUNTER — Telehealth: Payer: Self-pay | Admitting: Pulmonary Disease

## 2022-02-08 NOTE — Telephone Encounter (Signed)
Left message for [name] to call back

## 2022-02-11 ENCOUNTER — Other Ambulatory Visit: Payer: Self-pay | Admitting: Pulmonary Disease

## 2022-02-11 MED ORDER — TRELEGY ELLIPTA 200-62.5-25 MCG/ACT IN AEPB: 1.0000 | INHALATION_SPRAY | Freq: Every day | RESPIRATORY_TRACT | 5 refills | Status: DC

## 2022-02-11 NOTE — Telephone Encounter (Signed)
Called and spoke with [facility]. She verbalized understanding. The RX was accidentally sent to [facility]. I advised her that I would call to cancel the RX before it is processed.   Called [facility] and spoke with Katie. She was unable to find the RX. She stated it was possible it was still going through the intake process. Because of this, she is not able to cancel the RX. She stated for [place] to call back later today.   RX has been sent to [facility] as requested.   Will keep encounter open as a reminder to call [facility] later today.

## 2022-02-11 NOTE — Telephone Encounter (Signed)
Trelegy may be tried in place of Stiolto  Prescription will be sent into pharmacy  This will replace Stiolto, not to be used together

## 2022-02-11 NOTE — Progress Notes (Signed)
Prescription for Trelegy sent into pharmacy [facility]

## 2022-02-15 NOTE — Telephone Encounter (Signed)
[facility] to see if I could cancel the Rx for pt's Trelegy. Was told that the Rx shipped to pt already. Nothing further needed.

## 2022-03-02 ENCOUNTER — Ambulatory Visit (INDEPENDENT_AMBULATORY_CARE_PROVIDER_SITE_OTHER): Payer: No Typology Code available for payment source | Admitting: Pulmonary Disease

## 2022-03-02 ENCOUNTER — Encounter: Payer: Self-pay | Admitting: Pulmonary Disease

## 2022-03-02 VITALS — BP 130/80 | HR 71 | Temp 98.4°F | Ht 73.5 in | Wt 244.6 lb

## 2022-03-02 DIAGNOSIS — R0602 Shortness of breath: Secondary | ICD-10-CM | POA: Diagnosis not present

## 2022-03-02 DIAGNOSIS — J410 Simple chronic bronchitis: Secondary | ICD-10-CM

## 2022-03-02 DIAGNOSIS — Z9989 Dependence on other enabling machines and devices: Secondary | ICD-10-CM | POA: Diagnosis not present

## 2022-03-02 DIAGNOSIS — G4733 Obstructive sleep apnea (adult) (pediatric): Secondary | ICD-10-CM | POA: Diagnosis not present

## 2022-03-02 MED ORDER — DOXYCYCLINE HYCLATE 100 MG PO TABS: 100.0000 mg | ORAL_TABLET | Freq: Two times a day (BID) | ORAL | 0 refills | Status: DC

## 2022-03-02 MED ORDER — MODAFINIL 100 MG PO TABS: 100.0000 mg | ORAL_TABLET | ORAL | 2 refills | Status: DC

## 2022-03-02 MED ORDER — PREDNISONE 20 MG PO TABS: 20.0000 mg | ORAL_TABLET | Freq: Every day | ORAL | 0 refills | Status: DC

## 2022-03-02 NOTE — Patient Instructions (Addendum)
Prescription for doxycycline and prednisone sent to the pharmacy  If not feeling better at 14 days, we need to get a sputum sample to send to the lab  [facility]  Follow-up in 6 weeks  Call with significant concerns  Continue using your Trelegy

## 2022-03-02 NOTE — Progress Notes (Signed)
[name]    [id number]    1951/06/03  Primary Care Physician:Dean, Carolann Littler, MD  Referring Physician: Rogers Blocker, MD [address] [place],  [place] [address]  Chief complaint:   Patient with a history of narcolepsy, obstructive sleep apnea Compliant with CPAP, compliant with stimulant medications Recent ED evaluation for COPD exacerbation  HPI:  Was recently seen for shortness of breath, treated for exacerbation of his COPD Continues to cough up green phlegm despite completing antibiotics  Suggestion about inhaler change to Trelegy  History of narcolepsy History of sleep apnea for which he is on CPAP -This was recently switched to a BiPAP which he is tolerating well  Requires modafinil to stay alert during the day  Still does take regular daytime naps, sleep quality remains about the same  Following most activities, he will take a rest and may sleep for about 5 to 10 minutes to get himself rejuvenated  Recently in the emergency department for shortness of breath, cough with mucus production -He was treated for COPD exacerbation -Symptoms are better -He still does have a cough with sputum production but not feeling on the weather at present  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   albuterol (VENTOLIN HFA) 108 (90 Base) MCG/ACT inhaler Inhale 2 puffs into the lungs every 6 (six) hours as needed for wheezing or shortness of breath.   amLODipine (NORVASC) 10 MG tablet Take 10 mg by mouth daily.    aspirin EC 81 MG tablet Take 81 mg by mouth daily. Swallow whole.   b complex vitamins capsule Take 1 capsule by mouth daily.   carbamide peroxide (DEBROX) 6.5 % OTIC solution Place 5 drops into both ears as needed (ear wax).   Cholecalciferol (VITAMIN D3) 1000 UNITS CAPS Take 1,000 Units by mouth every morning.   FERGON 240 (27 Fe) MG tablet Take 240 mg by mouth daily.   fexofenadine (ALLEGRA) 180 MG tablet Take 180 mg by mouth daily.   fluticasone (FLONASE) 50 MCG/ACT nasal spray Place 1 spray into both nostrils in the morning and at bedtime.   Fluticasone-Umeclidin-Vilant (TRELEGY ELLIPTA) 200-62.5-25 MCG/ACT AEPB Inhale 1 puff into the lungs daily.   gabapentin (NEURONTIN) 400 MG capsule Take 400 mg by mouth 3 (three) times daily.   hydrALAZINE (APRESOLINE) 50 MG tablet Take 50 mg by mouth 3 (three) times daily.   hydrOXYzine (ATARAX/VISTARIL) 10 MG tablet Take 20 mg by mouth at bedtime.   losartan (COZAAR) 100 MG tablet Take 100 mg by mouth every evening.   melatonin 3 MG TABS tablet Take 3 mg by mouth at bedtime.   meloxicam (MOBIC) 7.5 MG tablet Take 7.5 mg by mouth daily.   metFORMIN (GLUCOPHAGE) 500 MG tablet Take 500 mg by mouth daily with breakfast.   modafinil (PROVIGIL) 100 MG tablet Take 100-200 mg by mouth See admin instructions. 200 mg in the morning  100 mg  at nooon   NON FORMULARY BIPAP at bedtime   omeprazole (PRILOSEC OTC) 20 MG tablet Take  1 tablet (20 mg total) by mouth daily.   PARoxetine (PAXIL) 40 MG tablet Take 40 mg by mouth at bedtime.   pravastatin (PRAVACHOL) 40 MG tablet Take 40 mg by mouth at bedtime.   predniSONE (DELTASONE) 10 MG tablet Take 4 tablets X 3 days, 3 tabs X 3 days, 2 tabs x 3 days, 1 tab x 3 days   Probiotic Product (PROBIOTIC DAILY PO) Take 1 capsule by mouth daily.   QUEtiapine (SEROQUEL) 25 MG tablet Take 50 mg by mouth at bedtime.   spironolactone (ALDACTONE) 25 MG tablet Take 50 mg by mouth daily.    tamsulosin (FLOMAX) 0.4 MG CAPS capsule  Take 0.4 mg by mouth every morning.   triamcinolone ointment (KENALOG) 0.1 % Apply 1 application. topically as needed (rash).   Turmeric 400 MG CAPS Take 400 mg by mouth daily.   vitamin E 400 UNIT capsule Take 400 Units by mouth daily.   [DISCONTINUED] azithromycin (ZITHROMAX Z-PAK) 250 MG tablet Take 2 tabs today, then 1 tab until gone (Patient not taking: Reported on 03/02/2022)   [DISCONTINUED] doxycycline (VIBRA-TABS) 100 MG tablet Take 1 tablet (100 mg total) by mouth 2 (two) times daily. (Patient not taking: Reported on 03/02/2022)   No facility-administered encounter medications on file as of 03/02/2022.    Allergies as of 03/02/2022 - Review Complete 03/02/2022  Allergen Reaction Noted   Ace inhibitors Swelling 08/27/2011   Sulfa antibiotics Swelling 08/27/2011    Past Medical History:  Diagnosis Date   Allergic rhinitis    Anxiety    Carpal tunnel syndrome on right    Coronary artery disease    Depression    Diabetes mellitus    DJD (degenerative joint disease)    Dysphagia    GERD (gastroesophageal reflux disease)    Hyperlipidemia    Hypertension    Hypothyroidism    Left inguinal hernia    Mental disorder    Narcolepsy    takes provigil   OSA on CPAP    Osteoporosis    Peripheral neuropathy    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   PTSD (post-traumatic stress disorder)    Weakness of left leg    Wears glasses     Past Surgical History:  Procedure Laterality Date   CARDIAC CATHETERIZATION     CERVICAL DISC SURGERY     x 2   COLONOSCOPY     HIP ARTHROPLASTY     right   INGUINAL HERNIA REPAIR Left 09/06/2019   Procedure: LAPAROSCOPIC LEFT INGUINAL HERNIA WITH MESH;  Surgeon: Greer Pickerel, MD;  Location: [facility];  Service: General;  Laterality: Left;   JOINT REPLACEMENT     KNEE ARTHROPLASTY     bilateral   LUMBAR LAMINECTOMY/DECOMPRESSION MICRODISCECTOMY  08/31/2011   Procedure: LUMBAR  LAMINECTOMY/DECOMPRESSION MICRODISCECTOMY;  Surgeon: Otilio Connors, MD;  Location: [facility];  Service: Neurosurgery;  Laterality: N/A;  LumbarThree to Lumbar Five Laminectomy, possible Lumbar Four-Five Diskectomy   SHOULDER SURGERY     left   UPPER GI ENDOSCOPY      Family History  Problem Relation Age of Onset   Diabetes Mother    Hypertension Brother    Hypertension Brother    Cancer Brother     Social History   Socioeconomic History   Marital status: Married    Spouse name: Not on file   Number of children: 2   Years of education: Not on file   Highest  education level: Not on file  Occupational History   Not on file  Tobacco Use   Smoking status: Never   Smokeless tobacco: Never  Vaping Use   Vaping Use: Never used  Substance and Sexual Activity   Alcohol use: No   Drug use: No   Sexual activity: Not Currently  Other Topics Concern   Not on file  Social History Narrative   Not on file   Social Determinants of Health   Financial Resource Strain: Not on file  Food Insecurity: Not on file  Transportation Needs: Not on file  Physical Activity: Not on file  Stress: Not on file  Social Connections: Not on file  Intimate Partner Violence: Not on file   Review of Systems  Constitutional:  Negative for activity change and appetite change.  HENT: Negative.    Eyes: Negative.   Respiratory:  Positive for apnea. Negative for cough, choking and shortness of breath.   Cardiovascular: Negative.   Endocrine: Negative.   Genitourinary: Negative.   Allergic/Immunologic: Negative.   Neurological: Negative.   Hematological: Negative.   Psychiatric/Behavioral:  Positive for sleep disturbance.     Vitals:   03/02/22 1339  BP: 130/80  Pulse: 71  Temp: 98.4 F (36.9 C)  SpO2: 100%    Physical Exam Constitutional:      General: He is not in acute distress.    Appearance: He is well-developed. He is not diaphoretic.  HENT:     Head: Normocephalic and  atraumatic.  Neck:     Thyroid: No thyromegaly.     Trachea: No tracheal deviation.  Cardiovascular:     Rate and Rhythm: Normal rate and regular rhythm.  Pulmonary:     Effort: Pulmonary effort is normal. No respiratory distress.     Breath sounds: Normal breath sounds. No stridor. No wheezing or rhonchi.  Musculoskeletal:        General: No deformity. Normal range of motion.     Cervical back: Normal range of motion and neck supple.  Skin:    General: Skin is warm and dry.     Findings: No erythema.  Neurological:     Mental Status: He is alert.     Coordination: Coordination normal.  Psychiatric:        Behavior: Behavior normal.    Data Reviewed: Sleep study from November 2007 noted Compliance data present did review 87% compliance, residual AHI of 9.6 Auto titrating CPAP pressure setting between 10 and 20 with a median pressure of 13.5  Compliance on recent BiPAP machine noted On a BiPAP of 18/14 Residual AHI of 4.1   CT scan of the chest with pleural plaques and rounded atelectasis from 08/14/2021-reviewed  Most recent chest x-ray 12/11/2021-reviewed by myself with no acute infiltrative process  Assessment:  1.  Obstructive sleep apnea-adequately treated -Continue BiPAP 18/14 -Download not available today  2.  Bronchitis -Course of doxycycline-14 days -Course of prednisone-7 days  3.  Narcolepsy-appears to have a schedule that works for him at present, will respect to his stimulants and also is scheduled nap -He does need daytime naps to feel well -Continues to take modafinil regularly  4.  PTSD history  5.  Abnormal CT scan of the chest showing rounded atelectasis and pleural plaques -In keeping with exposure to asbestos in the past  5.  Chronic obstructive pulmonary disease -Switched to Trelegy -Albuterol as needed  Plan/Recommendations:  Continue BiPAP on a nightly basis  Modafinil  Prescription for antibiotics and steroids  Graded exercise  as tolerated  Encouraged to call with any significant concerns  Encouraged to call with any significant concerns  We will follow-up in 6 weeks to see how he is doing   Sherrilyn Rist MD [facility] 03/02/2022, 1:45 PM  CC: Rogers Blocker, MD

## 2022-03-05 ENCOUNTER — Encounter: Payer: Self-pay | Admitting: Pulmonary Disease

## 2022-03-05 MED ORDER — MODAFINIL 100 MG PO TABS: 100.0000 mg | ORAL_TABLET | ORAL | 2 refills | Status: DC

## 2022-03-17 ENCOUNTER — Telehealth: Payer: Self-pay | Admitting: Pulmonary Disease

## 2022-03-17 DIAGNOSIS — R058 Other specified cough: Secondary | ICD-10-CM

## 2022-03-17 DIAGNOSIS — J449 Chronic obstructive pulmonary disease, unspecified: Secondary | ICD-10-CM

## 2022-03-18 NOTE — Telephone Encounter (Addendum)
I called the patient and left a message to call back for clarification. . Waiting on a call back.   I spoke with the wife and she reports she was told by Dr. Jenetta Downer that he would need a sputum sample. I have placed the order and the cup with the instruction up front.

## 2022-03-22 ENCOUNTER — Other Ambulatory Visit: Payer: No Typology Code available for payment source

## 2022-03-22 DIAGNOSIS — J449 Chronic obstructive pulmonary disease, unspecified: Secondary | ICD-10-CM

## 2022-03-22 DIAGNOSIS — R058 Other specified cough: Secondary | ICD-10-CM

## 2022-03-25 ENCOUNTER — Telehealth: Payer: Self-pay | Admitting: Pulmonary Disease

## 2022-03-26 NOTE — Telephone Encounter (Signed)
[name] wife returned phone call. Let her know we are waiting on Kelly's response from the [place].Marland Kitchen

## 2022-03-26 NOTE — Telephone Encounter (Signed)
[name] from the [place] sent updated [facility] Author[facility] starting 01/26/2022 to cover the visit. Contacted billing to file 6/13 visit with [facility]. Left voicemail for [name] with this information.

## 2022-03-26 NOTE — Telephone Encounter (Signed)
There was a lapse in [facility] from 01/20/2022 to 02/16/2022 and Mcarthur Rossetti was filed for the 6/13 acute visit. Left voicemail with Claiborne Billings at the [place] to see if 6/13 can be covered by the [place]. Left brief message for [name] to call the office back for this information.

## 2022-03-29 ENCOUNTER — Telehealth: Payer: Self-pay | Admitting: Pulmonary Disease

## 2022-03-29 NOTE — Telephone Encounter (Signed)
Called patient and he is stating that his Modafinil '100mg'$  needs to be renewed through the [place].  Sir please send in new rx if you are ok with it.  Thank you   Please advise

## 2022-03-31 ENCOUNTER — Other Ambulatory Visit: Payer: Self-pay | Admitting: Pulmonary Disease

## 2022-03-31 MED ORDER — MODAFINIL 100 MG PO TABS: 100.0000 mg | ORAL_TABLET | ORAL | 2 refills | Status: DC

## 2022-04-08 ENCOUNTER — Telehealth: Payer: Self-pay | Admitting: Pulmonary Disease

## 2022-04-08 NOTE — Telephone Encounter (Signed)
Dr. Jenetta Downer, please advise on this for pt in regards to pt's modafinil prescription. Instructions state for pt to take 200 mg (2 tabs) in the morning and then to take '100mg'$  (1 tab) at noon. Quantity of 60 tabs was sent in which is not enough to last 30 days.

## 2022-04-09 ENCOUNTER — Other Ambulatory Visit: Payer: Self-pay | Admitting: Pulmonary Disease

## 2022-04-09 MED ORDER — MODAFINIL 100 MG PO TABS: 100.0000 mg | ORAL_TABLET | ORAL | 2 refills | Status: DC

## 2022-04-09 NOTE — Telephone Encounter (Signed)
Prescription sent to [facility] to update previous prescription

## 2022-04-09 NOTE — Telephone Encounter (Signed)
Called and spoke with [name]. She verbalized understanding. [name] and patient also wanted to know if the culture results came back yet?  AO please advise.

## 2022-04-14 ENCOUNTER — Telehealth: Payer: Self-pay | Admitting: Pulmonary Disease

## 2022-04-14 NOTE — Telephone Encounter (Signed)
Pharmacy called to get verification on a script for Provigil.  He stated that it is written incorrectly.  Please call to discuss at [phone], ext. [phone]

## 2022-04-15 NOTE — Telephone Encounter (Signed)
Sputum collected 03/22/22 Takes up to 6 wks to grow  Spoke with spouse and notified of this  She verbalized understanding  Nothing further needed

## 2022-04-15 NOTE — Telephone Encounter (Signed)
I do not see any culture results on Epic and on care everywhere

## 2022-04-16 NOTE — Telephone Encounter (Signed)
Please advise on the change in medication? This is not urgent./

## 2022-04-20 ENCOUNTER — Ambulatory Visit (INDEPENDENT_AMBULATORY_CARE_PROVIDER_SITE_OTHER): Payer: No Typology Code available for payment source | Admitting: Pulmonary Disease

## 2022-04-20 ENCOUNTER — Encounter: Payer: Self-pay | Admitting: Pulmonary Disease

## 2022-04-20 VITALS — BP 122/76 | HR 68 | Ht 71.5 in | Wt 248.0 lb

## 2022-04-20 DIAGNOSIS — R918 Other nonspecific abnormal finding of lung field: Secondary | ICD-10-CM

## 2022-04-20 DIAGNOSIS — R0602 Shortness of breath: Secondary | ICD-10-CM | POA: Diagnosis not present

## 2022-04-20 MED ORDER — MODAFINIL 100 MG PO TABS: 100.0000 mg | ORAL_TABLET | ORAL | 2 refills | Status: DC

## 2022-04-20 NOTE — Progress Notes (Signed)
[name]    [id number]    [birth year]  Primary Care Physician:Dean, Carolann Littler, MD  Referring Physician: Rogers Blocker, MD [address] [place],  [place] [address]  Chief complaint:   Patient with a history of narcolepsy, obstructive sleep apnea Compliant with CPAP, compliant with stimulant medications Recent ED evaluation for COPD exacerbation  HPI:  Was recently seen for shortness of breath, treated for exacerbation of his COPD Continues to cough up green phlegm despite completing antibiotics  Suggestion about inhaler change to Trelegy  History of narcolepsy History of sleep apnea for which he is on CPAP -This was recently switched to a BiPAP which he is tolerating well  Requires modafinil to stay alert during the day  Still does take regular daytime naps, sleep quality remains about the same  Following most activities, he will take a rest and may sleep for about 5 to 10 minutes to get himself rejuvenated  Recently in the emergency department for shortness of breath, cough with mucus production -He was treated for COPD exacerbation -Symptoms are better -He still does have a cough with sputum production but not feeling on the weather at present  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 albuterol (PROVENTIL) (2.5 MG/3ML) 0.083% nebulizer solution Take 3 mLs (2.5 mg total) by nebulization every 4 (four) hours as needed for wheezing or shortness of breath.  Marland Kitchen albuterol (VENTOLIN HFA) 108 (90 Base) MCG/ACT inhaler Inhale 2 puffs into the lungs every 6 (six) hours as needed for wheezing or shortness of breath.  Marland Kitchen amLODipine (NORVASC) 10 MG tablet Take 10 mg by mouth daily.  Marland Kitchen  aspirin EC 81 MG tablet Take 81 mg by mouth daily. Swallow whole.  . b complex vitamins capsule Take 1 capsule by mouth daily.  . carbamide peroxide (DEBROX) 6.5 % OTIC solution Place 5 drops into both ears as needed (ear wax).  . Cholecalciferol (VITAMIN D3) 1000 UNITS CAPS Take 1,000 Units by mouth every morning.  . FERGON 240 (27 Fe) MG tablet Take 240 mg by mouth daily.  . fexofenadine (ALLEGRA) 180 MG tablet Take 180 mg by mouth daily.  . fluticasone (FLONASE) 50 MCG/ACT nasal spray Place 1 spray into both nostrils in the morning and at bedtime.  . Fluticasone-Umeclidin-Vilant (TRELEGY ELLIPTA) 200-62.5-25 MCG/ACT AEPB Inhale 1 puff into the lungs daily.  Marland Kitchen gabapentin (NEURONTIN) 400 MG capsule Take 400 mg by mouth 3 (three) times daily.  . hydrALAZINE (APRESOLINE) 50 MG tablet Take 50 mg by mouth 3 (three) times daily.  . hydrOXYzine (ATARAX/VISTARIL) 10 MG tablet Take 20 mg by mouth at bedtime.  Marland Kitchen losartan (COZAAR) 100 MG tablet Take 100 mg by mouth every evening.  . melatonin 3 MG TABS tablet Take 3 mg by mouth at bedtime.  . meloxicam (MOBIC) 7.5 MG tablet Take 7.5 mg by mouth daily.  . metFORMIN (GLUCOPHAGE) 500 MG tablet Take 500 mg by mouth daily with breakfast.  . modafinil (PROVIGIL) 100 MG tablet Take 1-2 tablets (100-200 mg total) by mouth See admin instructions. 200 mg in the morning 100 mg  at nooon  . NON FORMULARY BIPAP at bedtime  . omeprazole (PRILOSEC OTC) 20 MG  tablet Take 1 tablet (20 mg total) by mouth daily.  Marland Kitchen PARoxetine (PAXIL) 40 MG tablet Take 40 mg by mouth at bedtime.  . pravastatin (PRAVACHOL) 40 MG tablet Take 40 mg by mouth at bedtime.  . Probiotic Product (PROBIOTIC DAILY PO) Take 1 capsule by mouth daily.  . QUEtiapine (SEROQUEL) 25 MG tablet Take 50 mg by mouth at bedtime.  Marland Kitchen spironolactone (ALDACTONE) 25 MG tablet Take 50 mg by mouth daily.   . tamsulosin (FLOMAX) 0.4 MG CAPS capsule Take 0.4 mg by mouth every morning.  . triamcinolone ointment  (KENALOG) 0.1 % Apply 1 application. topically as needed (rash).  . Turmeric 400 MG CAPS Take 400 mg by mouth daily.  . vitamin E 400 UNIT capsule Take 400 Units by mouth daily.  . [DISCONTINUED] doxycycline (VIBRA-TABS) 100 MG tablet Take 1 tablet (100 mg total) by mouth 2 (two) times daily. (Patient not taking: Reported on 04/20/2022)  . [DISCONTINUED] predniSONE (DELTASONE) 20 MG tablet Take 1 tablet (20 mg total) by mouth daily with breakfast. (Patient not taking: Reported on 04/20/2022)   No facility-administered encounter medications on file as of 04/20/2022.    Allergies as of 04/20/2022 - Review Complete 04/20/2022  Allergen Reaction Noted  . Ace inhibitors Swelling 08/27/2011  . Sulfa antibiotics Swelling 08/27/2011    Past Medical History:  Diagnosis Date  . Allergic rhinitis   . Anxiety   . Carpal tunnel syndrome on right   . Coronary artery disease   . Depression   . Diabetes mellitus   . DJD (degenerative joint disease)   . Dysphagia   . GERD (gastroesophageal reflux disease)   . Hyperlipidemia   . Hypertension   . Hypothyroidism   . Left inguinal hernia   . Mental disorder   . Narcolepsy    takes provigil  . OSA on CPAP   . Osteoporosis   . Peripheral neuropathy   . 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 PTSD (post-traumatic stress disorder)   . Weakness of left leg   . Wears glasses     Past Surgical History:  Procedure Laterality Date  . CARDIAC CATHETERIZATION    . CERVICAL DISC SURGERY     x 2  . COLONOSCOPY    . HIP ARTHROPLASTY     right  . INGUINAL HERNIA REPAIR Left 09/06/2019   Procedure: LAPAROSCOPIC LEFT INGUINAL HERNIA WITH MESH;  Surgeon: Greer Pickerel, MD;  Location: [facility];  Service: General;  Laterality: Left;  . JOINT REPLACEMENT    . KNEE ARTHROPLASTY     bilateral  . LUMBAR LAMINECTOMY/DECOMPRESSION MICRODISCECTOMY  08/31/2011   Procedure: LUMBAR LAMINECTOMY/DECOMPRESSION  MICRODISCECTOMY;  Surgeon: Otilio Connors, MD;  Location: [facility];  Service: Neurosurgery;  Laterality: N/A;  LumbarThree to Lumbar Five Laminectomy, possible Lumbar Four-Five Diskectomy  . SHOULDER SURGERY     left  . UPPER GI ENDOSCOPY      Family History  Problem Relation Age of Onset  . Diabetes Mother   . Hypertension Brother   . Hypertension Brother   . Cancer Brother     Social History   Socioeconomic History  . Marital status: Married    Spouse name: Not on file  . Number of children: 2  . Years of education: Not on file  . Highest education level: Not on file  Occupational History  . Not on file  Tobacco Use  . Smoking status: Never  . Smokeless tobacco:  Never  Vaping Use  . Vaping Use: Never used  Substance and Sexual Activity  . Alcohol use: No  . Drug use: No  . Sexual activity: Not Currently  Other Topics Concern  . Not on file  Social History Narrative  . Not on file   Social Determinants of Health   Financial Resource Strain: Not on file  Food Insecurity: Not on file  Transportation Needs: Not on file  Physical Activity: Not on file  Stress: Not on file  Social Connections: Not on file  Intimate Partner Violence: Not on file   Review of Systems  Constitutional:  Negative for activity change and appetite change.  HENT: Negative.    Eyes: Negative.   Respiratory:  Positive for apnea. Negative for cough, choking and shortness of breath.   Cardiovascular: Negative.   Endocrine: Negative.   Genitourinary: Negative.   Allergic/Immunologic: Negative.   Neurological: Negative.   Hematological: Negative.   Psychiatric/Behavioral:  Positive for sleep disturbance.     Vitals:   04/20/22 1122  BP: 122/76  Pulse: 68  SpO2: 96%    Physical Exam Constitutional:      General: He is not in acute distress.    Appearance: He is well-developed. He is not diaphoretic.  HENT:     Head: Normocephalic and atraumatic.  Neck:     Thyroid: No  thyromegaly.     Trachea: No tracheal deviation.  Cardiovascular:     Rate and Rhythm: Normal rate and regular rhythm.  Pulmonary:     Effort: Pulmonary effort is normal. No respiratory distress.     Breath sounds: Normal breath sounds. No stridor. No wheezing or rhonchi.  Musculoskeletal:        General: No deformity. Normal range of motion.     Cervical back: Normal range of motion and neck supple.  Skin:    General: Skin is warm and dry.     Findings: No erythema.  Neurological:     Mental Status: He is alert.     Coordination: Coordination normal.  Psychiatric:        Behavior: Behavior normal.   Data Reviewed: Sleep study from November 2007 noted Compliance data present did review 87% compliance, residual AHI of 9.6 Auto titrating CPAP pressure setting between 10 and 20 with a median pressure of 13.5  Compliance on recent BiPAP machine noted On a BiPAP of 18/14 Residual AHI of 4.1   CT scan of the chest with pleural plaques and rounded atelectasis from 08/14/2021-reviewed  Most recent chest x-ray 12/11/2021-reviewed by myself with no acute infiltrative process  Assessment:  1.  Obstructive sleep apnea-adequately treated -Continue BiPAP 18/14 -Download not available today  2.  Bronchitis -Course of doxycycline-14 days -Course of prednisone-7 days  3.  Narcolepsy-appears to have a schedule that works for him at present, will respect to his stimulants and also is scheduled nap -He does need daytime naps to feel well -Continues to take modafinil regularly  4.  PTSD history  5.  Abnormal CT scan of the chest showing rounded atelectasis and pleural plaques -In keeping with exposure to asbestos in the past  5.  Chronic obstructive pulmonary disease -Switched to Trelegy -Albuterol as needed  Plan/Recommendations:  Continue BiPAP on a nightly basis  Modafinil  Prescription for antibiotics and steroids   Graded exercise as tolerated  Encouraged to call with  any significant concerns  Encouraged to call with any significant concerns  We will follow-up in 6 weeks to see  how he is doing   Sherrilyn Rist MD [facility] 04/20/2022, 11:23 AM  CC: Rogers Blocker, MD

## 2022-04-20 NOTE — Patient Instructions (Signed)
Continue with modafinil, 200 in the morning and 100 in the afternoon  Continue BiPAP nightly  Order CT scan of the chest without contrast to follow-up on left lower lobe nodule-this is likely related to her as best as exposure in the past  The sputum that you brought in on August 7 is still pending cultures-pending results  I will see you back in 4 months

## 2022-04-20 NOTE — Telephone Encounter (Signed)
No change to his medications  Takes 200 mg in the morning and 100 mg later in the day

## 2022-04-21 ENCOUNTER — Encounter: Payer: Self-pay | Admitting: Pulmonary Disease

## 2022-04-21 NOTE — Telephone Encounter (Signed)
Noted.  Will close encounter.  

## 2022-04-22 ENCOUNTER — Telehealth: Payer: Self-pay | Admitting: Pulmonary Disease

## 2022-04-22 NOTE — Telephone Encounter (Signed)
[name] from [facility] is needing clarification on Provigil. States instructs 3 tablets daily with only 30 tablets, so it is a 10 day supply with 2 refills.  Please call back at [phone] ext [phone].

## 2022-04-23 NOTE — Telephone Encounter (Signed)
[facility] wanting [facility] on the provigil.  Can you clarify for me so I can give them a call back.   States instructs 3 tablets daily with only 30 tablets, so it is a 10 day supply with 2 refills.  Do you want it for the full 30 days with 2 refills sir?  Please advise sir

## 2022-04-27 NOTE — Telephone Encounter (Signed)
Clarification for Provigil  Patient as been on 200 mg in the morning and 100 mg in the afternoon for over the past year

## 2022-04-28 NOTE — Telephone Encounter (Signed)
Dr. Jenetta Downer, please relook at message stated and see if you are okay changing the quantity as the [facility] pharmacist is stating that with it instructing 3 tablets daily, it is really only a 10-day supply that has been sent in with 2 refills.  Please advise if you want to change this from 30 tablets to 90 tablets so that way the Rx will be for a 30-day supply of pt's provigil.

## 2022-04-30 ENCOUNTER — Telehealth: Payer: Self-pay | Admitting: Pulmonary Disease

## 2022-04-30 NOTE — Telephone Encounter (Signed)
Called by patient's wife who relates that the patient has a history or COPD and is coughing up green sputum. Called patient at [phone]. No answer. Message left on voice mail to return my call at [place].

## 2022-04-30 NOTE — Telephone Encounter (Signed)
Called by wife, who relates a history of COPD and patient is now coughing up green sputum. The patient states that he is more SOB than usual. Denies fevers, chills or sweats. Will call in prescriptions for: Doxycycline 100 mg, #10, Sig: 1 tab PO Q 12 hours X 5 days. 2. Prednisone 10 mg, #20, Sig: take 4 tabs PO Q day x 2 days, then 3 tabs PO Q day X 2 day, then 2 tabs PO Q day X 2 days, and 1 tab PO Q day X 2 days. Instructed patient that is he became more SOB, had fevers, chills or sweats that he should go to the Emergency Department for further evaluation, management and possible hospital admission. Patient also instructed to call PCCM office Monday morning for f/u appointment.

## 2022-05-04 NOTE — Telephone Encounter (Signed)
90 tablets with each prescription to last for 1 month and then refills as needed  Has been on the same dose for a few years

## 2022-05-12 LAB — AFB CULTURE WITH SMEAR (NOT AT ARMC)
Acid Fast Culture: NEGATIVE
Acid Fast Smear: NEGATIVE

## 2022-05-13 NOTE — Progress Notes (Signed)
Sputum that was sent into culture is negative

## 2022-06-24 ENCOUNTER — Ambulatory Visit: Payer: Medicare PPO | Admitting: Cardiology

## 2022-06-29 ENCOUNTER — Other Ambulatory Visit: Payer: Self-pay | Admitting: Pulmonary Disease

## 2022-06-29 ENCOUNTER — Telehealth: Payer: Self-pay | Admitting: Pulmonary Disease

## 2022-06-29 MED ORDER — MODAFINIL 100 MG PO TABS: 100.0000 mg | ORAL_TABLET | ORAL | 2 refills | Status: DC

## 2022-06-29 NOTE — Telephone Encounter (Signed)
Called and spoke with patient's wife. She is aware that the RX has been sent. Nothing further needed.

## 2022-06-29 NOTE — Telephone Encounter (Signed)
Called and spoke with patient's wife. She stated that the modafinil '100mg'$  quantity does not match the instructions. Per the wife, he is taking 2 tablets in the morning and 1 tablet in the afternoon. He has been getting 60 tablets for the past 2 months and has been running out. She is requesting to have the RX updated to 90 tablets. She wishes to have this sent to the [facility] in [place].   AO, please advise if you are ok with updating the modafinil prescription to 90 tablets. Thanks!

## 2022-06-29 NOTE — Telephone Encounter (Signed)
Patient would like the nurse to call regarding her prescription for medication.  He stated that it was written incorrectly according to the dosage that he needs to take.  Please call to discuss asap.  CB# [phone]

## 2022-06-29 NOTE — Telephone Encounter (Signed)
Medication refill sent to [facility] with 90 tablets clearly stated 2 in the morning and 1 at midday

## 2022-06-30 ENCOUNTER — Other Ambulatory Visit: Payer: Self-pay | Admitting: Pulmonary Disease

## 2022-06-30 ENCOUNTER — Other Ambulatory Visit: Payer: Self-pay

## 2022-06-30 MED ORDER — MODAFINIL 100 MG PO TABS: 100.0000 mg | ORAL_TABLET | ORAL | 2 refills | Status: DC

## 2022-07-06 ENCOUNTER — Telehealth: Payer: Self-pay | Admitting: Pulmonary Disease

## 2022-07-06 ENCOUNTER — Encounter: Payer: Self-pay | Admitting: Pulmonary Disease

## 2022-07-06 ENCOUNTER — Ambulatory Visit (INDEPENDENT_AMBULATORY_CARE_PROVIDER_SITE_OTHER): Payer: No Typology Code available for payment source | Admitting: Pulmonary Disease

## 2022-07-06 VITALS — BP 144/64 | HR 84 | Temp 98.2°F | Ht 73.5 in | Wt 242.6 lb

## 2022-07-06 DIAGNOSIS — R0602 Shortness of breath: Secondary | ICD-10-CM

## 2022-07-06 DIAGNOSIS — G4733 Obstructive sleep apnea (adult) (pediatric): Secondary | ICD-10-CM

## 2022-07-06 MED ORDER — PREDNISONE 20 MG PO TABS: 40.0000 mg | ORAL_TABLET | Freq: Every day | ORAL | 0 refills | Status: DC

## 2022-07-06 MED ORDER — AZITHROMYCIN 250 MG PO TABS: 250.0000 mg | ORAL_TABLET | Freq: Every day | ORAL | 0 refills | Status: DC

## 2022-07-06 NOTE — Patient Instructions (Addendum)
COPD exacerbation -Prescription for azithromycin and prednisone sent to pharmacy for you  Continue using your CPAP nightly  Continue using your current inhalers  I will see you back in about 3 months

## 2022-07-06 NOTE — Progress Notes (Signed)
[name]    [id number]    [birth year]  Primary Care Physician:Dean, Carolann Littler, MD  Referring Physician: Rogers Blocker, [address] [place],  [place] [id number]  Chief complaint:   Patient with a history of narcolepsy, obstructive sleep apnea Compliant with CPAP, compliant with stimulant medications In for follow-up today Has been coughing a little bit more with more wheezing  HPI:  More shortness of breath, wheezing, cough  No recent use of antibiotics in the last 2 to 3 months  Continues to use Trelegy on a daily basis Using albuterol up to 3 times a day  He does have a history of narcolepsy History of obstructive sleep apnea for which he is on BiPAP Still with daytime sleepiness for which he is on modafinil -200 mg in the morning and 100 mg in the afternoon  Still does take regular daytime naps, sleep quality remains about the same  Does require rest time following most activities  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   albuterol (VENTOLIN HFA) 108 (90 Base) MCG/ACT inhaler Inhale 2 puffs into the lungs every 6 (six) hours as needed for wheezing or shortness of breath.   amLODipine (NORVASC) 10 MG tablet Take 10 mg by mouth daily.   aspirin EC 81 MG tablet Take 81 mg by mouth daily. Swallow whole.   azithromycin (ZITHROMAX) 250 MG tablet Take 1 tablet (250 mg total) by mouth daily. 2 tablets on day one and then daily for 4 days   b complex vitamins capsule Take 1 capsule by mouth daily.    carbamide peroxide (DEBROX) 6.5 % OTIC solution Place 5 drops into both ears as needed (ear wax).   Cholecalciferol (VITAMIN D3) 1000 UNITS CAPS Take 1,000 Units by mouth every morning.   FERGON 240 (27 Fe) MG tablet Take 240 mg by mouth daily.   fexofenadine (ALLEGRA) 180 MG tablet Take 180 mg by mouth daily.   fluticasone (FLONASE) 50 MCG/ACT nasal spray Place 1 spray into both nostrils in the morning and at bedtime.   Fluticasone-Umeclidin-Vilant (TRELEGY ELLIPTA) 200-62.5-25 MCG/ACT AEPB Inhale 1 puff into the lungs daily.   gabapentin (NEURONTIN) 400 MG capsule Take 400 mg by mouth 3 (three) times daily.   hydrALAZINE (APRESOLINE) 50 MG tablet Take 50 mg by mouth 3 (three) times daily.   hydrOXYzine (ATARAX/VISTARIL) 10 MG tablet Take 20 mg by mouth at bedtime.   losartan (COZAAR) 100 MG tablet Take 100 mg by mouth every evening.   melatonin 3 MG TABS tablet Take 3 mg by mouth at bedtime.   meloxicam (MOBIC) 7.5 MG tablet Take 7.5 mg by mouth daily.   metFORMIN (GLUCOPHAGE) 500 MG tablet Take 500 mg by mouth daily with breakfast.   modafinil (PROVIGIL) 100 MG tablet Take 1-2 tablets (100-200 mg total) by mouth See admin instructions. 200 mg in the morning 100 mg  at nooon   NON FORMULARY BIPAP at bedtime   omeprazole (PRILOSEC OTC) 20 MG tablet Take 1 tablet (20 mg total)  by mouth daily.   PARoxetine (PAXIL) 40 MG tablet Take 40 mg by mouth at bedtime.   pravastatin (PRAVACHOL) 40 MG tablet Take 40 mg by mouth at bedtime.   predniSONE (DELTASONE) 20 MG tablet Take 2 tablets (40 mg total) by mouth daily with breakfast.   Probiotic Product (PROBIOTIC DAILY PO) Take 1 capsule by mouth daily.   QUEtiapine (SEROQUEL) 25 MG tablet Take 50 mg by mouth at bedtime.   spironolactone (ALDACTONE) 25 MG tablet Take 50 mg by mouth daily.    tamsulosin (FLOMAX) 0.4 MG CAPS capsule Take 0.4 mg by mouth every morning.   triamcinolone ointment (KENALOG) 0.1 % Apply 1 application. topically as needed  (rash).   Turmeric 400 MG CAPS Take 400 mg by mouth daily.   vitamin E 400 UNIT capsule Take 400 Units by mouth daily.   No facility-administered encounter medications on file as of 07/06/2022.    Allergies as of 07/06/2022 - Review Complete 07/06/2022  Allergen Reaction Noted   Ace inhibitors Swelling 08/27/2011   Sulfa antibiotics Swelling 08/27/2011    Past Medical History:  Diagnosis Date   Allergic rhinitis    Anxiety    Carpal tunnel syndrome on right    Coronary artery disease    Depression    Diabetes mellitus    DJD (degenerative joint disease)    Dysphagia    GERD (gastroesophageal reflux disease)    Hyperlipidemia    Hypertension    Hypothyroidism    Left inguinal hernia    Mental disorder    Narcolepsy    takes provigil   OSA on CPAP    Osteoporosis    Peripheral neuropathy    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   PTSD (post-traumatic stress disorder)    Weakness of left leg    Wears glasses     Past Surgical History:  Procedure Laterality Date   CARDIAC CATHETERIZATION     CERVICAL DISC SURGERY     x 2   COLONOSCOPY     HIP ARTHROPLASTY     right   INGUINAL HERNIA REPAIR Left 09/06/2019   Procedure: LAPAROSCOPIC LEFT INGUINAL HERNIA WITH MESH;  Surgeon: Greer Pickerel, MD;  Location[facility];  Service: General;  Laterality: Left;   JOINT REPLACEMENT     KNEE ARTHROPLASTY     bilateral   LUMBAR LAMINECTOMY/DECOMPRESSION MICRODISCECTOMY  08/31/2011   Procedure: LUMBAR LAMINECTOMY/DECOMPRESSION MICRODISCECTOMY;  Surgeon: Otilio Connors, MD;  Location: [place] [facility];  Service: Neurosurgery;  Laterality: N/A;  LumbarThree to Lumbar Five Laminectomy, possible Lumbar Four-Five Diskectomy   SHOULDER SURGERY     left   UPPER GI ENDOSCOPY      Family History  Problem Relation Age of Onset   Diabetes Mother    Hypertension Brother    Hypertension Brother    Cancer Brother     Social History    Socioeconomic History   Marital status: Married    Spouse name: Not on file   Number of children: 2   Years of education: Not on file   Highest education level: Not on file  Occupational History   Not on file  Tobacco Use   Smoking status: Never   Smokeless tobacco: Never  Vaping Use   Vaping Use: Never used  Substance and Sexual Activity   Alcohol use: No   Drug use: No   Sexual activity: Not Currently  Other Topics Concern   Not on file  Social History Narrative   Not on file   Social Determinants of Health   Financial Resource Strain: Not on file  Food Insecurity: Not on file  Transportation Needs: Not on file  Physical Activity: Not on file  Stress: Not on file  Social Connections: Not on file  Intimate Partner Violence: Not on file   Review of Systems  Constitutional:  Negative for activity change and appetite change.  HENT: Negative.    Eyes: Negative.   Respiratory:  Positive for apnea. Negative for cough, choking and shortness of breath.   Cardiovascular: Negative.   Endocrine: Negative.   Genitourinary: Negative.   Allergic/Immunologic: Negative.   Neurological: Negative.   Hematological: Negative.   Psychiatric/Behavioral:  Positive for sleep disturbance.     Vitals:   07/06/22 1111  BP: (!) 144/64  Pulse: 84  Temp: 98.2 F (36.8 C)  SpO2: 98%    Physical Exam Constitutional:      General: He is not in acute distress.    Appearance: He is well-developed. He is not diaphoretic.  HENT:     Head: Normocephalic and atraumatic.     Mouth/Throat:     Mouth: Mucous membranes are moist.  Eyes:     Pupils: Pupils are equal, round, and reactive to light.  Neck:     Thyroid: No thyromegaly.     Trachea: No tracheal deviation.  Cardiovascular:     Rate and Rhythm: Normal rate and regular rhythm.  Pulmonary:     Effort: Pulmonary effort is normal. No respiratory distress.     Breath sounds: Normal breath sounds. No stridor. No wheezing or  rhonchi.  Musculoskeletal:        General: No deformity. Normal range of motion.     Cervical back: No rigidity or tenderness.  Skin:    General: Skin is warm and dry.     Findings: No erythema.  Neurological:     Mental Status: He is alert.     Coordination: Coordination normal.  Psychiatric:        Behavior: Behavior normal.    Data Reviewed: Sleep study from November 2007 noted Compliance data present did review 87% compliance, residual AHI of 9.6 Auto titrating CPAP pressure setting between 10 and 20 with a median pressure of 13.5  Compliance report shows excellent compliance with CPAP-100% Residual AHI of 3.5 On BiPAP of 18/14  CT scan of the chest with pleural plaques and rounded atelectasis from 08/14/2021-reviewed  Most recent chest x-ray 12/11/2021-reviewed by myself with no acute infiltrative process  Assessment:  1.  Obstructive sleep apnea-adequately treated -Continue BiPAP  Bronchitis Prescription for azithromycin and prednisone sent to pharmacy  Narcolepsy -Continue modafinil  PTSD history  Chronic obstructive pulmonary disease -Continue Trelegy and albuterol as needed  Plan/Recommendations:   Continue modafinil Continue BiPAP  Continue bronchodilators  Prescription for prednisone and azithromycin sent to pharmacy  Follow-up in about 3 months   Sherrilyn Rist MD [facility] 07/06/2022, 11:31 AM  CC: Rogers Blocker, MD

## 2022-07-07 MED ORDER — PREDNISONE 20 MG PO TABS: 40.0000 mg | ORAL_TABLET | Freq: Every day | ORAL | 0 refills | Status: DC

## 2022-07-07 MED ORDER — AZITHROMYCIN 250 MG PO TABS: 250.0000 mg | ORAL_TABLET | Freq: Every day | ORAL | 0 refills | Status: DC

## 2022-07-07 NOTE — Telephone Encounter (Signed)
Meds had been sent to [facility] instead of pt's local pharmacy. Called and spoke with pt's spouse letting her know what had happened and stated to her that I had sent the meds to the preferred pharmacy and she verbalized understanding. Nothing further needed.

## 2022-07-23 ENCOUNTER — Encounter: Payer: Self-pay | Admitting: Cardiology

## 2022-07-23 ENCOUNTER — Ambulatory Visit: Payer: Medicare PPO | Admitting: Cardiology

## 2022-07-23 VITALS — BP 145/73 | HR 72 | Ht 73.5 in | Wt 243.0 lb

## 2022-07-23 DIAGNOSIS — I10 Essential (primary) hypertension: Secondary | ICD-10-CM

## 2022-07-23 DIAGNOSIS — I2510 Atherosclerotic heart disease of native coronary artery without angina pectoris: Secondary | ICD-10-CM

## 2022-07-23 DIAGNOSIS — E782 Mixed hyperlipidemia: Secondary | ICD-10-CM

## 2022-07-23 DIAGNOSIS — R0609 Other forms of dyspnea: Secondary | ICD-10-CM

## 2022-07-23 DIAGNOSIS — G4733 Obstructive sleep apnea (adult) (pediatric): Secondary | ICD-10-CM

## 2022-07-23 DIAGNOSIS — E6609 Other obesity due to excess calories: Secondary | ICD-10-CM

## 2022-07-23 DIAGNOSIS — E1140 Type 2 diabetes mellitus with diabetic neuropathy, unspecified: Secondary | ICD-10-CM

## 2022-07-23 MED ORDER — EMPAGLIFLOZIN 10 MG PO TABS: 10.0000 mg | ORAL_TABLET | Freq: Every day | ORAL | 0 refills | Status: AC

## 2022-07-23 NOTE — Progress Notes (Signed)
[name] Date of Birth: 10-15-1950 MRN: [id number] Primary Care Provider:Dean, Carolann Littler, MD ( also sees Dr. Daneil Dolin at [facility]).  Former Cardiology Providers: Dr. Glennon Mac  Primary Cardiologist: Rex Kras, DO, [facility] ([facility] care 12/07/2019)  Date: 07/23/22 Last Office Visit: 06/25/2021  Chief Complaint  Patient presents with   Follow-up    1 year   Shortness of breath   Coronary Artery Disease   Hypertension    HPI  [name] is a 71 y.o. male whose past medical history and cardiovascular risk factors are: Nonobstructive coronary artery disease, hypertension, hyperlipidemia, non-insulin-dependent diabetes mellitus type 2, COPD, OSA on BiPAP, advanced age, obesity.   Patient was referred to the practice in April 2021 for concerns for dyspnea on exertion.  He is undergone appropriate cardiovasculdar workup including a coronary CTA which noted mild CAC and CAD RADS 2 and CT FFR noted no significant hemodynamic stenosis.  Patient did follow-up with pulmonary medicine for further evaluation and management.  He presents today for 1 year follow-up visit. Patient is accompanied by his wife [name]) at today's office visit over the last 1 year he is doing well from a cardiovascular standpoint no hospitalizations or urgent care visits for cardiac reasons.  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  He has been requiring regular use of antibiotics and steroids for COPD management.  His dyspnea on exertion remains relatively stable and at times worsened.  He denies anginal discomfort or heart failure symptoms.  Blood pressures are around 300 mmHg systolic.  He follows up with his primary care provider within the community as well as that the [place].  FUNCTIONAL STATUS: He mows his lawn using a self-propelled machine, does yardwork as well.    ALLERGIES: Allergies  Allergen Reactions   Misc. Sulfonamide  Containing Compounds Other (See Comments)   Ace Inhibitors Swelling   Sulfa Antibiotics Swelling    MEDICATION LIST PRIOR TO VISIT: Current Outpatient Medications on File Prior to Visit  Medication Sig Dispense Refill   albuterol (PROVENTIL) (2.5 MG/3ML) 0.083% nebulizer solution Take 3 mLs (2.5 mg total) by nebulization every 4 (four) hours as needed for wheezing or shortness of breath. 1080 mL 1   albuterol (VENTOLIN HFA) 108 (90 Base) MCG/ACT inhaler Inhale 2 puffs into the lungs every 6 (six) hours as needed for wheezing or shortness of breath. 8 g 5   amLODipine (NORVASC) 10 MG tablet Take 10 mg by mouth daily.     aspirin EC 81 MG tablet Take 81 mg by mouth daily. Swallow whole.     b complex vitamins capsule Take 1 capsule by mouth daily.     carbamide peroxide (DEBROX) 6.5 % OTIC solution Place 5 drops into both ears as needed (ear wax).     Cholecalciferol (VITAMIN D3) 1000 UNITS CAPS Take 1,000 Units by mouth every morning.     FERGON 240 (27 Fe) MG tablet Take 240 mg by mouth daily.     fexofenadine (ALLEGRA) 180 MG tablet Take 180 mg by mouth daily.     fluticasone (FLONASE) 50 MCG/ACT nasal spray Place 1 spray into both nostrils in the morning and at bedtime.     Fluticasone-Umeclidin-Vilant (TRELEGY ELLIPTA) 200-62.5-25 MCG/ACT AEPB Inhale 1 puff into the lungs daily. 180 each 5   gabapentin (NEURONTIN) 400 MG capsule Take 400 mg by mouth 3 (three) times daily.     hydrALAZINE (APRESOLINE) 50  MG tablet Take 50 mg by mouth 3 (three) times daily.     hydrOXYzine (ATARAX/VISTARIL) 10 MG tablet Take 20 mg by mouth at bedtime.     losartan (COZAAR) 100 MG tablet Take 100 mg by mouth every evening.     melatonin 3 MG TABS tablet Take 3 mg by mouth at bedtime.     meloxicam (MOBIC) 7.5 MG tablet Take 7.5 mg by mouth daily.     metFORMIN (GLUCOPHAGE) 500 MG tablet Take 500 mg by mouth daily with breakfast.     modafinil (PROVIGIL) 100 MG tablet Take 1-2 tablets (100-200 mg total) by  mouth See admin instructions. 200 mg in the morning 100 mg  at nooon 90 tablet 2   NON FORMULARY BIPAP at bedtime     omeprazole (PRILOSEC OTC) 20 MG tablet Take 1 tablet (20 mg total) by mouth daily. 30 tablet 2   PARoxetine (PAXIL) 40 MG tablet Take 40 mg by mouth at bedtime.     pravastatin (PRAVACHOL) 40 MG tablet Take 40 mg by mouth at bedtime.     Probiotic Product (PROBIOTIC DAILY PO) Take 1 capsule by mouth daily.     QUEtiapine (SEROQUEL) 25 MG tablet Take 50 mg by mouth at bedtime.     spironolactone (ALDACTONE) 25 MG tablet Take 50 mg by mouth daily.      tamsulosin (FLOMAX) 0.4 MG CAPS capsule Take 0.4 mg by mouth every morning.     triamcinolone ointment (KENALOG) 0.1 % Apply 1 application. topically as needed (rash).     Turmeric 400 MG CAPS Take 400 mg by mouth daily.     vitamin E 400 UNIT capsule Take 400 Units by mouth daily.     No current facility-administered medications on file prior to visit.    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   PTSD (post-traumatic stress disorder)    Weakness of left leg    Wears glasses     PAST SURGICAL HISTORY: Past Surgical History:  Procedure Laterality Date   CARDIAC CATHETERIZATION     CERVICAL DISC SURGERY     x 2   COLONOSCOPY     HIP ARTHROPLASTY     right   INGUINAL HERNIA REPAIR Left 09/06/2019   Procedure: LAPAROSCOPIC LEFT INGUINAL HERNIA WITH MESH;  Surgeon:  Greer Pickerel, MD;  Location: [facility];  Service: General;  Laterality: Left;   JOINT REPLACEMENT     KNEE ARTHROPLASTY     bilateral   LUMBAR LAMINECTOMY/DECOMPRESSION MICRODISCECTOMY  08/31/2011   Procedure: LUMBAR LAMINECTOMY/DECOMPRESSION MICRODISCECTOMY;  Surgeon: Otilio Connors, MD;  Location: [facility];  Service: Neurosurgery;  Laterality: N/A;  LumbarThree to Lumbar Five Laminectomy, possible Lumbar Four-Five Diskectomy   SHOULDER SURGERY     left   UPPER GI  ENDOSCOPY      FAMILY HISTORY: The patient family history includes Cancer in his brother; Diabetes in his mother; Hypertension in his brother and brother.   SOCIAL HISTORY:  The patient  reports that he has never smoked. He has never used smokeless tobacco. He reports that he does not drink alcohol and does not use drugs.  REVIEW OF SYSTEMS: Review of Systems  Constitutional: Negative for chills and fever.  HENT:  Negative for ear discharge, ear pain and nosebleeds.   Eyes:  Negative for blurred vision and discharge.  Cardiovascular:  Positive for dyspnea on exertion (chronic on stable). Negative for chest pain, claudication, leg swelling, near-syncope, orthopnea, palpitations, paroxysmal nocturnal dyspnea and syncope.  Respiratory:  Positive for shortness of breath. Negative for cough, sleep disturbances due to breathing and snoring.   Endocrine: Negative for polydipsia, polyphagia and polyuria.  Hematologic/Lymphatic: Negative for bleeding problem.  Skin:  Negative for flushing and nail changes.  Musculoskeletal:  Negative for muscle cramps, muscle weakness and myalgias.  Gastrointestinal:  Negative for abdominal pain, dysphagia, hematemesis, hematochezia, melena, nausea and vomiting.  Neurological:  Negative for dizziness, focal weakness and paresthesias.    PHYSICAL EXAM:    07/23/2022   10:25 AM 07/06/2022   11:11 AM 04/20/2022   11:22 AM  Vitals with BMI  Height 6' 1.5" 6' 1.5" 5' 11.5"   Weight 243 lbs 242 lbs 10 oz 248 lbs  BMI 31.62 47.09 62.83  Systolic 662 947 654  Diastolic 73 64 76  Pulse 72 84 68   Physical Exam  Constitutional: No distress.  Age appropriate, hemodynamically stable.   Neck: No JVD present.  Cardiovascular: Normal rate, regular rhythm, S1 normal, S2 normal, intact distal pulses and normal pulses. Exam reveals no gallop, no S3 and no S4.  No murmur heard. Pulmonary/Chest: Effort normal. No stridor. He has no wheezes. He has no rales.  Decreased breath sounds bilateral  Abdominal: Soft. Bowel sounds are normal. He exhibits no distension. There is no abdominal tenderness.  Musculoskeletal:        General: No edema.     Cervical back: Neck supple.     Comments: Bilateral foot braces  Neurological: He is alert and oriented to person, place, and time. He has intact cranial nerves (2-12).  Skin: Skin is warm and moist.   CARDIAC DATABASE: EKG: 07/23/2022: Normal sinus rhythm, 76 bpm, without underlying ischemia or injury pattern.  Echocardiogram: 12/18/2019: LVEF 55-60%, mild LVH, grade 2 diastolic impairment, mildly dilated left atrium, slightly dilated right atrium, mild MR, mild TR.  Stress Testing: Lexiscan/modified Bruce Tetrofosmin stress test 12/24/2019: Normal stress myocardial perfusion. Stress LVEF calculated 46%, although visually appears normal.   Heart catheterization: 09/28/2004 by Dr. Terrence Dupont: Per report, LVEF of 50-55%, left main patent, LAD patent, diagonal 1 patent, diagonal 2 small patent, LCx 15 to 20% ostial stenosis, OM1 10-15% ostial stenosis, OM 2 and 3 small in caliber overall patent, RCA 10-15% mid stenosis.  72 hour Holter monitor:  Dominant rhythm sinus bradycardia, followed by sinus tachycardia.  Heart rate 45-126 bpm.  Average heart rate 70 bpm.  No atrial fibrillation/atrial flutter/ventricular tachycardia/high grade AV block, sinus pause greater than or equal to 3 seconds in duration.  Total ventricular ectopic  burden <0.01%.  Patient had 2 runs of supraventricular tachycardia.  The longest run was 4 beats in duration at 112 bpm.  The fastest run was 4 beats in duration at 118 bpm.  Total supraventricular ectopic burden <0.07% (64 premature  supraventricular contractions).  Number of patient triggered events: 0.   CCTA 06/16/2021: 1. Total coronary calcium score of 89.6. This was 65th percentile  for age and sex matched control.   2. Normal coronary origin with right dominance.   3. CAD-RADS = 2 Mild non-obstructive CAD (25-49%). Minimal stenosis (0-24%) distal left main due to calcified plaque otherwise no significant stenosis.  Minimal stenosis (0-24%) due to mixed plaque in the proximal LAD, mild stenosis (25-49%) due to mixed plaque mid/distal LAD, otherwise no significant plaque or stenosis.  Ramus intermedius: Patent.   LCX is patent with no evidence of plaque or stenosis.   Minimal calcified plaque (0-24%) proximal RCA otherwise no significant evidence of plaque or stenosis.   4. CT-FFR negative for hemodynamically significant stenosis.   5. Moderate-sized hiatal hernia.  6. Evidence of prior asbestos exposure and related lung disease with multiple areas of calcified pleural plaque bilaterally and an area of elongated pulmonary density in the posteromedial left lower lobe with associated architectural distortion measuring 2 x 2.8 x 7.9 cm. 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 Given the impressive nature of this area of atelectasis/scarring as well as extensive scarring elsewhere in the left lung and calcified pleural plaques, this may be worth correlating with pulmonary function tests and following by Pulmonology.  LABORATORY DATA:    Latest Ref Rng & Units 12/11/2021    2:47 AM 07/23/2021   10:27 AM 09/03/2019   10:16 AM  CBC   WBC 4.0 - 10.5 K/uL 8.4  6.0  5.3   Hemoglobin 13.0 - 17.0 g/dL 12.7  12.4  12.3   Hematocrit 39.0 - 52.0 % 41.5  40.2  40.8   Platelets 150 - 400 K/uL 273  229.0  238        Latest Ref Rng & Units 12/11/2021    2:47 AM 07/23/2021   10:24 AM 05/25/2021    9:37 AM  CMP  Glucose 70 - 99 mg/dL 96  93  85   BUN 8 - 23 mg/dL '12  21  16   '$ Creatinine 0.61 - 1.24 mg/dL 1.28  1.25  1.38   Sodium 135 - 145 mmol/L 136  140  141   Potassium 3.5 - 5.1 mmol/L 4.0  4.2  4.4   Chloride 98 - 111 mmol/L 103  104  104   CO2 22 - 32 mmol/L '25  27  23   '$ Calcium 8.9 - 10.3 mg/dL 9.2  9.8  9.8   Total Protein 6.5 - 8.1 g/dL 7.0  7.5    Total Bilirubin 0.3 - 1.2 mg/dL 0.2  0.4    Alkaline Phos 38 - 126 U/L 77  61    AST 15 - 41 U/L 48  28    ALT 0 - 44 U/L 33  22     External Labs: Collected: 04/28/2022 available in Care Everywhere. Creatinine at 1.32, BUN 18. Sodium 139, potassium 4, chloride 106, bicarb 28, GFR 58 A1c 7.  FINAL MEDICATION LIST END OF ENCOUNTER: Meds ordered this encounter  Medications   empagliflozin (JARDIANCE) 10 MG TABS tablet    Sig: Take 1 tablet (10 mg total) by mouth daily before breakfast.    Dispense:  90 tablet    Refill:  0     Medications Discontinued During This Encounter  Medication Reason   azithromycin (  ZITHROMAX) 250 MG tablet Completed Course   predniSONE (DELTASONE) 20 MG tablet Discontinued by provider     Current Outpatient Medications:    albuterol (PROVENTIL) (2.5 MG/3ML) 0.083% nebulizer solution, Take 3 mLs (2.5 mg total) by nebulization every 4 (four) hours as needed for wheezing or shortness of breath., Disp: 1080 mL, Rfl: 1   albuterol (VENTOLIN HFA) 108 (90 Base) MCG/ACT inhaler, Inhale 2 puffs into the lungs every 6 (six) hours as needed for wheezing or shortness of breath., Disp: 8 g, Rfl: 5   amLODipine (NORVASC) 10 MG tablet, Take 10 mg by mouth daily., Disp: , Rfl:    aspirin EC 81 MG tablet, Take 81 mg by mouth daily. Swallow whole.,  Disp: , Rfl:    b complex vitamins capsule, Take 1 capsule by mouth daily., Disp: , Rfl:    carbamide peroxide (DEBROX) 6.5 % OTIC solution, Place 5 drops into both ears as needed (ear wax)., Disp: , Rfl:    Cholecalciferol (VITAMIN D3) 1000 UNITS CAPS, Take 1,000 Units by mouth every morning., Disp: , Rfl:    empagliflozin (JARDIANCE) 10 MG TABS tablet, Take 1 tablet (10 mg total) by mouth daily before breakfast., Disp: 90 tablet, Rfl: 0   FERGON 240 (27 Fe) MG tablet, Take 240 mg by mouth daily., Disp: , Rfl:    fexofenadine (ALLEGRA) 180 MG tablet, Take 180 mg by mouth daily., Disp: , Rfl:    fluticasone (FLONASE) 50 MCG/ACT nasal spray, Place 1 spray into both nostrils in the morning and at bedtime., Disp: , Rfl:    Fluticasone-Umeclidin-Vilant (TRELEGY ELLIPTA) 200-62.5-25 MCG/ACT AEPB, Inhale 1 puff into the lungs daily., Disp: 180 each, Rfl: 5   gabapentin (NEURONTIN) 400 MG capsule, Take 400 mg by mouth 3 (three) times daily., Disp: , Rfl:    hydrALAZINE (APRESOLINE) 50 MG tablet, Take 50 mg by mouth 3 (three) times daily., Disp: , Rfl:    hydrOXYzine (ATARAX/VISTARIL) 10 MG tablet, Take 20 mg by mouth at bedtime., Disp: , Rfl:    losartan (COZAAR) 100 MG tablet, Take 100 mg by mouth every evening., Disp: , Rfl:    melatonin 3 MG TABS tablet, Take 3 mg by mouth at bedtime., Disp: , Rfl:    meloxicam (MOBIC) 7.5 MG tablet, Take 7.5 mg by mouth daily., Disp: , Rfl:    metFORMIN (GLUCOPHAGE) 500 MG tablet, Take 500 mg by mouth daily with breakfast., Disp: , Rfl:    modafinil (PROVIGIL) 100 MG tablet, Take 1-2 tablets (100-200 mg total) by mouth See admin instructions. 200 mg in the morning 100 mg  at nooon, Disp: 90 tablet, Rfl: 2   NON FORMULARY, BIPAP at bedtime, Disp: , Rfl:    omeprazole (PRILOSEC OTC) 20 MG tablet, Take 1 tablet (20 mg total) by mouth daily., Disp: 30 tablet, Rfl: 2   PARoxetine (PAXIL) 40 MG tablet, Take 40 mg by mouth at bedtime., Disp: , Rfl:    pravastatin  (PRAVACHOL) 40 MG tablet, Take 40 mg by mouth at bedtime., Disp: , Rfl:    Probiotic Product (PROBIOTIC DAILY PO), Take 1 capsule by mouth daily., Disp: , Rfl:    QUEtiapine (SEROQUEL) 25 MG tablet, Take 50 mg by mouth at bedtime., Disp: , Rfl:    spironolactone (ALDACTONE) 25 MG tablet, Take 50 mg by mouth daily. , Disp: , Rfl:    tamsulosin (FLOMAX) 0.4 MG CAPS capsule, Take 0.4 mg by mouth every morning., Disp: , Rfl:    triamcinolone ointment (KENALOG) 0.1 %,  Apply 1 application. topically as needed (rash)., Disp: , Rfl:    Turmeric 400 MG CAPS, Take 400 mg by mouth daily., Disp: , Rfl:    vitamin E 400 UNIT capsule, Take 400 Units by mouth daily., Disp: , Rfl:   IMPRESSION:    ICD-10-CM   1. Dyspnea on exertion  R06.09 EKG 12-Lead    2. Nonobstructive atherosclerosis of coronary artery  I25.10     3. Essential hypertension  I10 empagliflozin (JARDIANCE) 10 MG TABS tablet    Basic metabolic panel    4. Mixed hyperlipidemia  E78.2     5. Type 2 diabetes mellitus with diabetic neuropathy, without long-term current use of insulin (HCC)  E11.40 empagliflozin (JARDIANCE) 10 MG TABS tablet    Basic metabolic panel    6. OSA treated with BiPAP  G47.33     7. Class 1 obesity due to excess calories with serious comorbidity and body mass index (BMI) of 31.0 to 31.9 in adult  E66.09    Z68.31        RECOMMENDATIONS: [name] is a 71 y.o. male whose past medical history and cardiac risk factors include: Nonobstructive coronary artery disease, hypertension, hyperlipidemia, non-insulin-dependent diabetes mellitus type 2, OSA on CPAP, advanced age, obesity.  Dyspnea on exertion Chronic and stable Echo: Preserved LVEF, grade 2 diastolic dysfunction, no significant valvular heart disease. Nuclear stress test performed in May 2021 reported to be low risk study. Coronary CTA: Total CAC 89.6AU, 65th percentile, CAD-RADS 2.  Currently being treated for COPD with antibiotics and steroids  per pulmonary medicine. His CPAP has been up titrated to a BiPAP. No additional cardiovascular testing warranted at this time.  Nonobstructive atherosclerosis of coronary artery Total CAC 89.6AU, 65th percentile CAD-RADS 2  Continue aspirin and statin therapy. No additional diagnostic testing warranted at this time.  Essential hypertension Office blood pressures within acceptable range but not at goal. Home blood pressures usually are around 140 mmHg. Given the fact that he is a diabetic would recommend a goal SBP of 130 mmHg. His blood pressures are currently being managed by his PCP (Dr. Daneil Dolin) at the [facility]. Given his shortness of breath, blood pressure not at goal, and underlying diabetes we discussed initiation of Jardiance.  Start Jardiance 10 mg p.o. daily  Labs in 1 week to evaluate kidney function and electrolytes. No prior history of urinary tract infections or diabetic ketoacidosis. Medications reconciled. Low-salt diet recommended  Mixed hyperlipidemia Currently on pravastatin.   He denies myalgia or other side effects. Currently managed by primary care provider.  Most recent labs reviewed from care everywhere -no lipid profile available for reference  Type 2 diabetes mellitus with diabetic neuropathy, without long-term current use of insulin ([place]) Educated on importance of glycemic control. Currently on ARB. Currently on statin therapy. Start [place] as discussed above. Currently managed by primary care provider.  OSA on BiPAP Transitioned from CPAP to BiPAP since last office visit.   Patient utilizes his BiPAP on a daily basis.  Class 1 obesity due to excess calories with serious comorbidity and body mass index (BMI) of 31.0 to 31.9 in adult Body mass index is 31.63 kg/m. I reviewed with the patient the importance of diet, regular physical activity/exercise, weight loss.   Patient is educated on increasing physical activity gradually as tolerated.  With  the goal of moderate intensity exercise for 30 minutes a day 5 days a week.  Orders Placed This Encounter  Procedures   Basic metabolic  panel   EKG 12-Lead    --Continue cardiac medications as reconciled in final medication list. --Return in about 1 year (around 07/24/2023) for Annual follow up visit, Coronary artery calcification. Or sooner if needed. --Continue follow-up with your primary care physician regarding the management of your other chronic comorbid conditions.  Patient's questions and concerns were addressed to his satisfaction. He voices understanding of the instructions provided during this encounter.   This note was created using a voice recognition software as a result there may be grammatical errors inadvertently enclosed that do not reflect the nature of this encounter. Every attempt is made to correct such errors.  Rex Kras, [place], [facility]  Pager: [phone] Office: [phone]

## 2022-08-13 ENCOUNTER — Other Ambulatory Visit: Payer: Self-pay | Admitting: Cardiology

## 2022-08-14 LAB — BASIC METABOLIC PANEL
BUN/Creatinine Ratio: 13 (ref 10–24)
BUN: 17 mg/dL (ref 8–27)
CO2: 23 mmol/L (ref 20–29)
Calcium: 9.6 mg/dL (ref 8.6–10.2)
Chloride: 103 mmol/L (ref 96–106)
Creatinine, Ser: 1.36 mg/dL — ABNORMAL HIGH (ref 0.76–1.27)
Glucose: 84 mg/dL (ref 70–99)
Potassium: 4.4 mmol/L (ref 3.5–5.2)
Sodium: 142 mmol/L (ref 134–144)
eGFR: 56 mL/min/{1.73_m2} — ABNORMAL LOW (ref >59)

## 2022-08-17 NOTE — Progress Notes (Signed)
Left VM for patient to call back for results.

## 2022-08-18 NOTE — Progress Notes (Signed)
Called patient to inform him about his lab results. Pt understood.

## 2022-08-19 NOTE — Progress Notes (Signed)
Assessment/Plan:    The patient is seen in neurologic consultation at the request of [name] for the evaluation of memory.  [name] is a very pleasant 72 y.o. year Tim [name] male with  a history of hypertension, hyperlipidemia, anxiety, depression, OSA with a history of narcolepsy, compliant with BiPAP, GERD, microcytic anemia, ADD per patient report, diabetes with neuropathy, DJD, vitamin D deficiency, hypothyroidism, history of pituitary edema treated with bromocriptine, PTSD followed by [name] Psychiatry and psychotherapy, seen today for evaluation of memory loss. 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 He is  not on antidementia meds. It is interesting that if indeed patient has AD this has not progressed for almost 10 years, especially given his age. MoCA today is 24/30 with delayed recall of 5/5 (without cueing). Other workup is in progress.    Memory Impairment with behavioral disturbance (hallucinations)  MRI brain without contrast to assess for underlying structural abnormality and assess vascular load.  Neurocognitive testing to further evaluate cognitive concerns and determine other underlying cause of memory changes, including potential contribution from sleep, anxiety, attention or depression  Check B12, TSH Monitor driving Continue to control mood/ hallucinations as per Psychiatry Recommend good control of cardiovascular risk factors.   Folllow up  in 1 month to discuss the MRI results.   Subjective:    The patient is accompanied by his wife who supplements the history.    How long did patient have memory difficulties?  He has been diagnosed with MCI" likely due to Alzheimer's Disease " ( per Neuropsych evaluation at least dating back to 2015)- but his wife reports that this may be longer about 2013 when he began forgetting recent conversations or events, asking the same question and repeating a story.  Wife asks  him to repeat things, he may be experiencing disorganized thoughts. "He uses [name] to communicate". "A caregiver has to help him get organized with things around the house more often"-she says. To what he answers "I have been always been disorganized" . 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   Disoriented when walking into a room?  Patient denies. "He has a man cave where he is free to do anything he wants to do without disorganizing the whole house".   Leaving objects in unusual places?  Endorsed by his wife.   Wandering behavior? denies   Any personality changes since last visit?  He is very stressed out when she overprotects him.  She had surgery and was stressful for him; he was "lashing out to anybody, because everyone was trying to tell me what to do".  He has a  psychiatrist, and a psychotherapist whom he sees every 3 months  Any worsening depression?:  Patient is more depressed recently after his brother was diagnosed with [name] disease and also he is experiencing stress due to wife having a diagnosis of multiple myeloma and Lupus    Hallucinations or paranoia? "For at least 50 years".  "When I am very tired, I see cars, bicycle,  and money. " "I know they are not there"-he says  Seizures?   denies    Any sleep changes?  Sleeps w BiPAP and takes meds for mental health and "sleep hard about 4 h a night" "Nice vivid dreams", Denies REM behavior or sleepwalking.  Takes melatonin and Seroquel.  Sleep apnea? BiPAP , compliant Any hygiene concerns?  denies   Independent of bathing and dressing?  Endorsed  Does the patient need help with medications? Wife is in charge   Who is in charge of the finances?  Wife is in charge     Any changes in appetite?   denies     Patient have trouble swallowing?  denies   Does the patient cook? Wife since "I got booted  out because of the COPD and the neuropathy and I drop heavy things"-he says   Any headaches?  denies   Chronic back pain? Endorsed, sees [name], NS, and attends pain clinic. Ambulates with difficulty?  Uses a cane to ambulate and ankle braces for stability  Recent falls or head injuries?  denies     Unilateral weakness, numbness or tingling?He has known neuropathy, scheduled for [facility] 09/08/22 Dr Cyndy Odonnell.  Any tremors?"On the R hand he has mil tremors, chronic.    Any anosmia?  denies   Any incontinence of urine?  denies   Any bowel dysfunction?    denies      Patient lives  with wife History of heavy alcohol intake? denies   History of heavy tobacco use?   denies   Family history of dementia?     Mother had AD, GrandUncle (paternal) AD  Dose patient drive? No issues' " he does not like traffic, he does not like to go outside of what he knows, sense of direction sometimes is an issue"      Allergies  Allergen Reactions   Misc. Sulfonamide Containing Compounds Other (See Comments)   Ace Inhibitors Swelling   Sulfa Antibiotics Swelling    Current Outpatient Medications  Medication Instructions   albuterol (PROVENTIL) 2.5 mg, Nebulization, Every 4 hours PRN   albuterol (VENTOLIN HFA) 108 (90 Base) MCG/ACT inhaler 2 puffs, Inhalation, Every 6 hours PRN   amLODipine (NORVASC) 10 mg, Oral, Daily   aspirin EC 81 mg, Oral, Daily, Swallow whole.   b complex vitamins capsule 1 capsule, Oral, Daily   carbamide peroxide (DEBROX) 6.5 % OTIC solution 5 drops, Both EARS, As needed   empagliflozin (JARDIANCE) 10 mg, Oral, Daily before breakfast   Fergon 240 mg, Oral, Daily   fexofenadine (ALLEGRA) 180 mg, Oral, Daily,     fluticasone (FLONASE) 50 MCG/ACT nasal spray 1 spray, Each Nare, 2 times daily   Fluticasone-Umeclidin-Vilant (TRELEGY ELLIPTA) 200-62.5-25 MCG/ACT AEPB 1 puff, Inhalation, Daily   gabapentin (NEURONTIN) 400 mg, Oral, 3 times daily   hydrALAZINE (APRESOLINE) 50 mg, Oral, 3  times daily   hydrOXYzine (ATARAX) 20 mg, Oral, Daily at bedtime   losartan (COZAAR) 100 mg, Oral, Every evening   melatonin 3 mg, Oral, Daily at bedtime   meloxicam (MOBIC) 7.5 mg, Oral, Daily   metFORMIN (GLUCOPHAGE) 500 mg, Oral, Daily with breakfast,     modafinil (PROVIGIL) 100-200 mg, Oral, See admin instructions, 200 mg in the morning 100 mg  at nooon   NON FORMULARY BIPAP at bedtime   omeprazole (PRILOSEC OTC) 20 mg, Oral, Daily   PARoxetine (PAXIL) 40 mg, Oral, Daily at bedtime   pravastatin (PRAVACHOL) 40 mg, Oral, Daily at bedtime   Probiotic Product (PROBIOTIC DAILY PO) 1 capsule, Oral, Daily   QUEtiapine (SEROQUEL) 50 mg, Oral, Daily at bedtime   spironolactone (ALDACTONE) 50 mg, Oral, Daily   tamsulosin (FLOMAX) 0.4 mg, Oral, BH-each morning   triamcinolone ointment (KENALOG) 0.1 % 1 application , Topical,  As needed   Turmeric 400 mg, Oral, Daily   Vitamin D3 1,000 Units, Oral, BH-each morning,     vitamin E 400 Units, Oral, Daily,       VITALS:   Vitals:   08/20/22 1009  BP: (!) 166/67  Pulse: 79  Resp: 20  SpO2: 99%  Weight: 243 lb (110.2 kg)  Height: 6' 1.5" (1.867 m)       No data to display          PHYSICAL EXAM   HEENT:  Normocephalic, atraumatic. The mucous membranes are moist. The superficial temporal arteries are without ropiness or tenderness. Cardiovascular: Regular rate and rhythm. Lungs: Clear to auscultation bilaterally. Neck: There are no carotid bruits noted bilaterally.  NEUROLOGICAL:    08/21/2022   10:00 AM  Montreal Cognitive Assessment   Visuospatial/ Executive (0/5) 4  Naming (0/3) 3  Attention: Read list of digits (0/2) 1  Attention: Read list of letters (0/1) 0  Attention: Serial 7 subtraction starting at 100 (0/3) 3  Language: Repeat phrase (0/2) 1  Language : Fluency (0/1) 0  Abstraction (0/2) 1  Delayed Recall (0/5) 5  Orientation (0/6) 6  Total 24  Adjusted Score (based on education) 24        No data to display            Orientation:  Alert and oriented to person, place and time. No aphasia or dysarthria. Fund of knowledge is appropriate. Recent and remote memory intact.  Attention and concentration are reduced.  Able to name objects and repeat phrases. Delayed recall  5/5 Cranial nerves: There is good facial symmetry. Extraocular muscles are intact and visual fields are full to confrontational testing. Speech is fluent and clear, tangential. no tongue deviation. Hearing is intact to conversational tone. Tone: Tone is good throughout. Sensation: Sensation is intact to light touch  Vibration is unremarkable at the bilateral big toe.There is no extinction with double simultaneous stimulation. There is no sensory dermatomal level identified. Coordination: The patient has no difficulty with RAM's or FNF bilaterally. Normal finger to nose  Motor: Strength is 5/5 in the bilateral upper and lower extremities. There is no pronator drift. There are no fasciculations noted. DTR's: Deep tendon reflexes are 1/4 at the bilateral biceps, triceps, brachioradialis, patella and achilles.  Plantar responses are downgoing bilaterally. Gait and Station: The patient is able to ambulate without difficulty.The patient is able to ambulate in a tandem fashion, able to stand in the Romberg position.     Thank you for allowing [place] the opportunity to participate in the care of this nice patient. Please do not hesitate to contact us for any questions or concerns.   Total time spent on today's visit was 60 minutes dedicated to this patient today, preparing to see patient, examining the patient, ordering tests and/or medications and counseling the patient, documenting clinical information in the EHR or other health record, independently interpreting results and communicating results to the patient/family, discussing treatment and goals, answering patient's questions and coordinating care.  Cc:  Rogers Blocker, MD  Sharene Butters 08/21/2022 10:55 AM

## 2022-08-20 ENCOUNTER — Telehealth: Payer: Self-pay | Admitting: Pulmonary Disease

## 2022-08-20 ENCOUNTER — Ambulatory Visit (INDEPENDENT_AMBULATORY_CARE_PROVIDER_SITE_OTHER): Payer: No Typology Code available for payment source | Admitting: Physician Assistant

## 2022-08-20 ENCOUNTER — Other Ambulatory Visit (INDEPENDENT_AMBULATORY_CARE_PROVIDER_SITE_OTHER): Payer: No Typology Code available for payment source

## 2022-08-20 ENCOUNTER — Encounter: Payer: Self-pay | Admitting: Physician Assistant

## 2022-08-20 VITALS — BP 166/67 | HR 79 | Resp 20 | Ht 73.5 in | Wt 243.0 lb

## 2022-08-20 DIAGNOSIS — R413 Other amnesia: Secondary | ICD-10-CM

## 2022-08-20 LAB — TSH: TSH: 1.05 u[IU]/mL (ref 0.35–5.50)

## 2022-08-20 LAB — VITAMIN B12: Vitamin B-12: 1383 pg/mL — ABNORMAL HIGH (ref 211–911)

## 2022-08-20 NOTE — Patient Instructions (Addendum)
It was a pleasure to see you today at our office.   Recommendations:  Return in 1 month  MRI brain  Labs today  Neurocognitive testing  Whom to call:  Memory  decline, memory medications: Call our office [phone]   For psychiatric meds, mood meds: Please have your primary care physician manage these medications.      For assessment of decision of mental capacity and competency:  Call Dr. Anthoney Harada, geriatric psychiatrist at [phone]  For guidance in geriatric dementia issues please call [facility] [phone]     If you have any severe symptoms of a stroke, or other severe issues such as confusion,severe chills or fever, etc call 911 or go to the ER as you may need to be evaluated further       RECOMMENDATIONS FOR ALL PATIENTS WITH MEMORY PROBLEMS: 1. Continue to exercise (Recommend 30 minutes of walking everyday, or 3 hours every week) 2. Increase social interactions - continue going to [place] and enjoy social gatherings with friends and family 3. Eat healthy, avoid fried foods and eat more fruits and vegetables 4. Maintain adequate blood pressure, blood sugar, and blood cholesterol level. Reducing the risk of stroke and cardiovascular disease also helps promoting better memory. 5. Avoid stressful situations. Live a simple life and avoid aggravations. Organize your time and prepare for the next day in anticipation. 6. Sleep well, avoid any interruptions of sleep and avoid any distractions in the bedroom that may interfere with adequate sleep quality 7. Avoid sugar, avoid sweets as there is a strong link between excessive sugar intake, diabetes, and cognitive impairment We discussed the Mediterranean diet, which has been shown to help patients reduce the risk of progressive memory disorders and reduces cardiovascular risk. This includes eating fish, eat fruits and green leafy vegetables, nuts like almonds and hazelnuts, walnuts, and also use olive oil.  Avoid fast foods and fried foods as much as possible. Avoid sweets and sugar as sugar use has been linked to worsening of memory function.  There is always a concern of gradual progression of memory problems. If this is the case, then we may need to adjust level of care according to patient needs. Support, both to the patient and caregiver, should then be put into place.    The Alzheimer's Association is here all day, every day for people facing Alzheimer's disease through our free 24/7 Helpline: 941-250-6236. The Helpline provides reliable information and support to all those who need assistance, such as individuals living with memory loss, Alzheimer's or other dementia, caregivers, health care professionals and the public.  Our highly trained and knowledgeable staff can help you with: Understanding memory loss, dementia and Alzheimer's  Medications and other treatment options  General information about aging and brain health  Skills to provide quality care and to find the best care from professionals  Legal, financial and living-arrangement decisions Our Helpline also features: Confidential care consultation provided by master's level clinicians who can help with decision-making support, crisis assistance and education on issues families face every day  Help in a caller's preferred language using our translation service that features more than 200 languages and dialects  Referrals to local community programs, services and ongoing support     FALL PRECAUTIONS: Be cautious when walking. Scan the area for obstacles that may increase the risk of trips and falls. When getting up in the mornings, sit up at the edge of the bed for a few minutes before getting out of bed.  Consider elevating the bed at the head end to avoid drop of blood pressure when getting up. Walk always in a well-lit room (use night lights in the walls). Avoid area rugs or power cords from appliances in the middle of the walkways.  Use a walker or a cane if necessary and consider physical therapy for balance exercise. Get your eyesight checked regularly.  FINANCIAL OVERSIGHT: Supervision, especially oversight when making financial decisions or transactions is also recommended.  HOME SAFETY: Consider the safety of the kitchen when operating appliances like stoves, microwave oven, and blender. Consider having supervision and share cooking responsibilities until no longer able to participate in those. Accidents with firearms and other hazards in the house should be identified and addressed as well.   ABILITY TO BE LEFT ALONE: If patient is unable to contact 911 operator, consider using LifeLine, or when the need is there, arrange for someone to stay with patients. Smoking is a fire hazard, consider supervision or cessation. Risk of wandering should be assessed by caregiver and if detected at any point, supervision and safe proof recommendations should be instituted.  MEDICATION SUPERVISION: Inability to self-administer medication needs to be constantly addressed. Implement a mechanism to ensure safe administration of the medications.   DRIVING: Regarding driving, in patients with progressive memory problems, driving will be impaired. We advise to have someone else do the driving if trouble finding directions or if minor accidents are reported. Independent driving assessment is available to determine safety of driving.   If you are interested in the driving assessment, you can contact the following:  [employer] in [place]  [place] [phone] or [phone]      [place] refers to food and lifestyle choices that are based on the traditions of countries located on the [employer]. This way of eating has been shown to help prevent certain conditions and improve  outcomes for people who have chronic diseases, like kidney disease and heart disease. What are tips for following this plan? Lifestyle  Cook and eat meals together with your family, when possible. Drink enough fluid to keep your urine clear or pale yellow. Be physically active every day. This includes: Aerobic exercise like running or swimming. Leisure activities like gardening, walking, or housework. Get 7-8 hours of sleep each night. If recommended by your health care provider, drink red wine in moderation. This means 1 glass a day for nonpregnant women and 2 glasses a day for men. A glass of wine equals 5 oz (150 mL). Reading food labels  Check the serving size of packaged foods. For foods such as rice and pasta, the serving size refers to the amount of cooked product, not dry. Check the total fat in packaged foods. Avoid foods that have saturated fat or trans fats. Check the ingredients list for added sugars, such as corn syrup. Shopping  At the grocery store, buy most of your food from the areas near the walls of the store. This includes: Fresh fruits and vegetables (produce). Grains, beans, nuts, and seeds. Some of these may be available in unpackaged forms or large amounts (in bulk). Fresh seafood. Poultry and eggs. Low-fat dairy products. Buy whole ingredients instead of prepackaged foods. Buy fresh fruits and vegetables in-season from local farmers markets. Buy frozen fruits and vegetables in resealable bags. If you do not have access to quality fresh seafood, buy precooked frozen shrimp or canned fish, such  as tuna, salmon, or sardines. Buy small amounts of raw or cooked vegetables, salads, or olives from the deli or salad bar at your store. Stock your pantry so you always have certain foods on hand, such as olive oil, canned tuna, canned tomatoes, rice, pasta, and beans. Cooking  Cook foods with extra-virgin olive oil instead of using butter or other vegetable oils. Have meat  as a side dish, and have vegetables or grains as your main dish. This means having meat in small portions or adding small amounts of meat to foods like pasta or stew. Use beans or vegetables instead of meat in common dishes like chili or lasagna. Experiment with different cooking methods. Try roasting or broiling vegetables instead of steaming or sauteing them. Add frozen vegetables to soups, stews, pasta, or rice. Add nuts or seeds for added healthy fat at each meal. You can add these to yogurt, salads, or vegetable dishes. Marinate fish or vegetables using olive oil, lemon juice, garlic, and fresh herbs. Meal planning  Plan to eat 1 vegetarian meal one day each week. Try to work up to 2 vegetarian meals, if possible. Eat seafood 2 or more times a week. Have healthy snacks readily available, such as: Vegetable sticks with hummus. Greek yogurt. Fruit and nut trail mix. Eat balanced meals throughout the week. This includes: Fruit: 2-3 servings a day Vegetables: 4-5 servings a day Low-fat dairy: 2 servings a day Fish, poultry, or lean meat: 1 serving a day Beans and legumes: 2 or more servings a week Nuts and seeds: 1-2 servings a day Whole grains: 6-8 servings a day Extra-virgin olive oil: 3-4 servings a day Limit red meat and sweets to only a few servings a month What are my food choices? Mediterranean diet Recommended Grains: Whole-grain pasta. Brown rice. Bulgar wheat. Polenta. Couscous. Whole-wheat bread. [place]. Vegetables: Artichokes. Beets. Broccoli. Cabbage. Carrots. Eggplant. Green beans. Chard. Kale. Spinach. Onions. Leeks. Peas. Squash. Tomatoes. Peppers. Radishes. Fruits: Apples. Apricots. Avocado. Berries. Bananas. Cherries. Dates. Figs. Grapes. Lemons. Melon. Oranges. Peaches. Plums. Pomegranate. Meats and other protein foods: Beans. Almonds. Sunflower seeds. Pine nuts. Peanuts. [place]. Salmon. Scallops. Shrimp. [place]. Tilapia. Clams. Oysters. Eggs. Dairy: Low-fat  milk. Cheese. Greek yogurt. Beverages: Water. Red wine. Herbal tea. Fats and oils: Extra virgin olive oil. Avocado oil. Grape seed oil. Sweets and desserts: [place] yogurt with honey. Baked apples. Poached pears. Trail mix. Seasoning and other foods: Basil. Cilantro. Coriander. Cumin. Mint. Parsley. Sage. Rosemary. Tarragon. Garlic. Oregano. Thyme. Pepper. Balsalmic vinegar. Tahini. Hummus. Tomato sauce. Olives. Mushrooms. Limit these Grains: Prepackaged pasta or rice dishes. Prepackaged cereal with added sugar. Vegetables: Deep fried potatoes (french fries). Fruits: Fruit canned in syrup. Meats and other protein foods: Beef. Pork. Lamb. Poultry with skin. Hot dogs. Berniece Salines. Dairy: Ice cream. Sour cream. Whole milk. Beverages: Juice. Sugar-sweetened soft drinks. Beer. Liquor and spirits. Fats and oils: Butter. Canola oil. Vegetable oil. Beef fat (tallow). Lard. Sweets and desserts: Cookies. Cakes. Pies. Candy. Seasoning and other foods: Mayonnaise. Premade sauces and marinades. The items listed may not be a complete list. Talk with your dietitian about what dietary choices are right for you. Summary The Mediterranean diet includes both food and lifestyle choices. Eat a variety of fresh fruits and vegetables, beans, nuts, seeds, and whole grains. Limit the amount of red meat and sweets that you eat. Talk with your health care provider about whether it is safe for you to drink red wine in moderation. This means 1 glass a day for nonpregnant  women and 2 glasses a day for men. A glass of wine equals 5 oz (150 mL). This information is not intended to replace advice given to you by your health care provider. Make sure you discuss any questions you have with your health care provider. Document Released: 03/25/2016 Document Revised: 04/27/2016 Document Reviewed: 03/25/2016 Elsevier Interactive Patient Education  2017 [place] today [address] for MRI [phone]

## 2022-08-21 NOTE — Progress Notes (Signed)
Blood tests are normal. He has enough B12, may want to follow with PCP, thanks

## 2022-08-23 ENCOUNTER — Other Ambulatory Visit: Payer: Self-pay

## 2022-08-23 ENCOUNTER — Ambulatory Visit
Admission: RE | Admit: 2022-08-23 | Discharge: 2022-08-23 | Disposition: A | Discharge status: Home / Self Care | Payer: No Typology Code available for payment source | Source: Ambulatory Visit | Attending: Pulmonary Disease | Admitting: Pulmonary Disease

## 2022-08-23 ENCOUNTER — Other Ambulatory Visit: Payer: Self-pay | Admitting: Pulmonary Disease

## 2022-08-23 DIAGNOSIS — J449 Chronic obstructive pulmonary disease, unspecified: Secondary | ICD-10-CM

## 2022-08-23 DIAGNOSIS — R918 Other nonspecific abnormal finding of lung field: Secondary | ICD-10-CM

## 2022-08-23 DIAGNOSIS — R0602 Shortness of breath: Secondary | ICD-10-CM

## 2022-08-23 MED ORDER — ALBUTEROL SULFATE HFA 108 (90 BASE) MCG/ACT IN AERS: 2.0000 | INHALATION_SPRAY | Freq: Four times a day (QID) | RESPIRATORY_TRACT | 5 refills | Status: DC | PRN

## 2022-08-23 MED ORDER — ALBUTEROL SULFATE (2.5 MG/3ML) 0.083% IN NEBU: 2.5000 mg | INHALATION_SOLUTION | RESPIRATORY_TRACT | 2 refills | Status: DC | PRN

## 2022-08-23 MED ORDER — TRELEGY ELLIPTA 200-62.5-25 MCG/ACT IN AEPB: 1.0000 | INHALATION_SPRAY | Freq: Every day | RESPIRATORY_TRACT | 5 refills | Status: DC

## 2022-08-23 MED ORDER — MODAFINIL 100 MG PO TABS: 100.0000 mg | ORAL_TABLET | ORAL | 2 refills | Status: DC

## 2022-08-23 NOTE — Telephone Encounter (Signed)
Modafinil refilled.

## 2022-08-23 NOTE — Telephone Encounter (Signed)
Dr. Jenetta Downer I have refilled the other medication requested. Can you please refill his Modafinil?

## 2022-08-23 NOTE — Telephone Encounter (Signed)
Called and spoke with pt's spouse letting her know that [facility] refilled pt's modafinil and she verbalized understanding. Nothing further needed.

## 2022-08-24 ENCOUNTER — Other Ambulatory Visit: Payer: Self-pay | Admitting: Pulmonary Disease

## 2022-08-25 NOTE — Telephone Encounter (Signed)
Dr. Ander Slade, please advise on message about pt's Rx and advise.

## 2022-09-17 ENCOUNTER — Encounter: Payer: Self-pay | Admitting: Physician Assistant

## 2022-09-20 ENCOUNTER — Ambulatory Visit
Admission: RE | Admit: 2022-09-20 | Discharge: 2022-09-20 | Disposition: A | Discharge status: Home / Self Care | Payer: Medicare PPO | Source: Ambulatory Visit | Attending: Physician Assistant | Admitting: Physician Assistant

## 2022-09-29 ENCOUNTER — Ambulatory Visit (INDEPENDENT_AMBULATORY_CARE_PROVIDER_SITE_OTHER): Payer: No Typology Code available for payment source | Admitting: Pulmonary Disease

## 2022-09-29 ENCOUNTER — Encounter: Payer: Self-pay | Admitting: Pulmonary Disease

## 2022-09-29 VITALS — BP 140/58 | HR 87 | Ht 73.5 in | Wt 245.5 lb

## 2022-09-29 DIAGNOSIS — R0602 Shortness of breath: Secondary | ICD-10-CM | POA: Diagnosis not present

## 2022-09-29 DIAGNOSIS — J449 Chronic obstructive pulmonary disease, unspecified: Secondary | ICD-10-CM

## 2022-09-29 DIAGNOSIS — G4733 Obstructive sleep apnea (adult) (pediatric): Secondary | ICD-10-CM | POA: Diagnosis not present

## 2022-09-29 DIAGNOSIS — R918 Other nonspecific abnormal finding of lung field: Secondary | ICD-10-CM | POA: Diagnosis not present

## 2022-09-29 NOTE — Patient Instructions (Signed)
I will see you back in about 3 months  We will get a copy of your breathing study  Place referral for pulmonary rehab  Continue using your inhalers  Graded exercises as tolerated  Continue using your CPAP therapy  Call us with significant concerns

## 2022-09-30 NOTE — Progress Notes (Signed)
[name]    EA:[id number]    Jul 07, 1951  Primary Care Physician:Dean, Carolann Littler, MD  Referring Physician: Rogers Blocker, [address] [place],  [place] [address]  Chief complaint:   Patient with a history of narcolepsy, obstructive sleep apnea Compliant with BiPAP, compliant with stimulant medications In for follow-up today Shortness of breath on exertion  HPI:  Continues to complain of sore shortness of breath on exertion  Limited with activities  Continues to use his BiPAP on a nightly basis  Continues Trelegy Regular use of albuterol and nebulizer  He does have a history of narcolepsy History of obstructive sleep apnea for which he is on BiPAP Still with daytime sleepiness for which he is on modafinil -200 mg in the morning and 100 mg in the afternoon  Still does take regular daytime naps, sleep quality remains about the same  Does require rest time following most activities  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   albuterol (VENTOLIN HFA) 108 (90 Base) MCG/ACT inhaler Inhale 2 puffs into the lungs every 6 (six) hours as needed for wheezing or shortness of breath.   amLODipine (NORVASC) 10 MG tablet Take 10 mg by mouth daily.   aspirin EC 81 MG tablet Take 81 mg by mouth daily. Swallow whole.   b complex vitamins capsule Take 1 capsule by mouth daily.   carbamide peroxide (DEBROX) 6.5 % OTIC solution Place 5 drops into both ears as needed (ear wax).   Cholecalciferol (VITAMIN D3) 1000 UNITS CAPS  Take 1,000 Units by mouth every morning.   empagliflozin (JARDIANCE) 10 MG TABS tablet Take 1 tablet (10 mg total) by mouth daily before breakfast.   FERGON 240 (27 Fe) MG tablet Take 240 mg by mouth daily.   fexofenadine (ALLEGRA) 180 MG tablet Take 180 mg by mouth daily.   fluticasone (FLONASE) 50 MCG/ACT nasal spray Place 1 spray into both nostrils in the morning and at bedtime.   fluticasone-salmeterol (WIXELA INHUB) 250-50 MCG/ACT AEPB Inhale 1 puff into the lungs in the morning and at bedtime.   gabapentin (NEURONTIN) 400 MG capsule Take 400 mg by mouth 3 (three) times daily.   hydrALAZINE (APRESOLINE) 50 MG tablet Take 50 mg by mouth 3 (three) times daily.   hydrOXYzine (ATARAX/VISTARIL) 10 MG tablet Take 20 mg by mouth at bedtime.   losartan (COZAAR) 100 MG tablet Take 100 mg by mouth every evening.   melatonin 3 MG TABS tablet Take 3 mg by mouth at bedtime.   meloxicam (MOBIC) 7.5 MG tablet Take 7.5 mg by mouth daily.   metFORMIN (GLUCOPHAGE) 500 MG tablet Take 500 mg by mouth daily with breakfast.   modafinil (PROVIGIL) 100 MG tablet Take 1-2 tablets (100-200 mg total) by mouth See admin instructions. 200 mg in the morning 100 mg  at noon   NON FORMULARY BIPAP at bedtime   omeprazole (PRILOSEC OTC) 20 MG tablet Take 1 tablet (20 mg total) by mouth daily.   PARoxetine (PAXIL) 40 MG tablet Take  40 mg by mouth at bedtime.   pravastatin (PRAVACHOL) 40 MG tablet Take 40 mg by mouth at bedtime.   Probiotic Product (PROBIOTIC DAILY PO) Take 1 capsule by mouth daily.   QUEtiapine (SEROQUEL) 25 MG tablet Take 50 mg by mouth at bedtime.   spironolactone (ALDACTONE) 25 MG tablet Take 50 mg by mouth daily.    tamsulosin (FLOMAX) 0.4 MG CAPS capsule Take 0.4 mg by mouth every morning.   triamcinolone ointment (KENALOG) 0.1 % Apply 1 application. topically as needed (rash).   Turmeric 400 MG CAPS Take 400 mg by mouth daily.   vitamin E 400 UNIT capsule Take 400 Units by mouth daily.   No  facility-administered encounter medications on file as of 09/29/2022.    Allergies as of 09/29/2022 - Review Complete 09/29/2022  Allergen Reaction Noted   Misc. sulfonamide containing compounds Other (See Comments) 07/23/2022   Ace inhibitors Swelling 08/27/2011   Sulfa antibiotics Swelling 08/27/2011    Past Medical History:  Diagnosis Date   Allergic rhinitis    Anxiety    Carpal tunnel syndrome on right    COPD (chronic obstructive pulmonary disease) (HCC)    Coronary artery disease    Depression    Diabetes mellitus    DJD (degenerative joint disease)    Dysphagia    GERD (gastroesophageal reflux disease)    Hyperlipidemia    Hypertension    Hypothyroidism    Left inguinal hernia    Mental disorder    Narcolepsy    takes provigil   OSA on CPAP    Osteoporosis    Peripheral neuropathy    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   PTSD (post-traumatic stress disorder)    Weakness of left leg    Wears glasses     Past Surgical History:  Procedure Laterality Date   CARDIAC CATHETERIZATION     CERVICAL DISC SURGERY     x 2   COLONOSCOPY     HIP ARTHROPLASTY     right   INGUINAL HERNIA REPAIR Left 09/06/2019   Procedure: LAPAROSCOPIC LEFT INGUINAL HERNIA WITH MESH;  Surgeon: Greer Pickerel, MD;  Location: [facility];  Service: General;  Laterality: Left;   JOINT REPLACEMENT     KNEE ARTHROPLASTY     bilateral   LUMBAR LAMINECTOMY/DECOMPRESSION MICRODISCECTOMY  08/31/2011   Procedure: LUMBAR LAMINECTOMY/DECOMPRESSION MICRODISCECTOMY;  Surgeon: Otilio Connors, MD;  Location: [facility];  Service: Neurosurgery;  Laterality: N/A;  LumbarThree to Lumbar Five Laminectomy, possible Lumbar Four-Five Diskectomy   SHOULDER SURGERY     left   UPPER GI ENDOSCOPY      Family History  Problem Relation Age of Onset   Diabetes Mother    Hypertension Brother    Hypertension Brother    Cancer Brother     Social History    Socioeconomic History   Marital status: Married    Spouse name: Not on file   Number of children: 2   Years of education: Not on file   Highest education level: Not on file  Occupational History   Not on file  Tobacco Use   Smoking status: Never   Smokeless tobacco: Never  Vaping Use   Vaping Use: Never used  Substance and Sexual Activity   Alcohol use: No   Drug use: No   Sexual activity: Not Currently  Other Topics Concern   Not on file  Social History Narrative   Left handed  Drinks caffeine, soda   Lives with wife   retired   Investment banker, 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  HENT: Negative.    Eyes: Negative.   Respiratory:  Positive for apnea. Negative for cough, choking and shortness of breath.   Cardiovascular: Negative.   Endocrine: Negative.   Genitourinary: Negative.   Allergic/Immunologic: Negative.   Neurological: Negative.   Hematological: Negative.   Psychiatric/Behavioral:  Positive for sleep disturbance.     Vitals:   09/29/22 1038  BP: (!) 140/58  Pulse: 87  SpO2: 95%    Physical Exam Constitutional:      General: He is not in acute distress.    Appearance: He is well-developed. He is not diaphoretic.  HENT:     Head: Normocephalic and atraumatic.     Mouth/Throat:     Mouth: Mucous membranes are moist.  Eyes:     General: No scleral icterus.    Pupils: Pupils are equal, round, and reactive to light.  Neck:     Thyroid: No thyromegaly.     Trachea: No tracheal deviation.  Cardiovascular:     Rate and Rhythm: Normal rate and regular rhythm.  Pulmonary:     Effort: Pulmonary effort is normal. No respiratory distress.     Breath  sounds: Normal breath sounds. No stridor. No wheezing or rhonchi.  Musculoskeletal:        General: No deformity. Normal range of motion.     Cervical back: No rigidity or tenderness.  Skin:    General: Skin is warm and dry.     Findings: No erythema.  Neurological:     Mental Status: He is alert.     Coordination: Coordination normal.  Psychiatric:        Behavior: Behavior normal.    Data Reviewed: Sleep study from November 2007 noted Compliance data present did review 87% compliance, residual AHI of 9.6 Auto titrating BiPAP pressure setting between 10 and 20 with a median pressure of 13.5  Most recent compliance data reviewed showing excellent compliance of 100% Machine set at maximum IPAP of 18, minimum EPAP of 14 with a pressure support of 4 Residual AHI of 4.2 Average use of 7 hours 4 minutes  CT scan of the chest with pleural plaques and rounded atelectasis from 08/14/2021-reviewed  Most recent chest x-ray 12/11/2021-reviewed by myself with no acute infiltrative process  Assessment:  1.  Obstructive sleep apnea-adequately treated -Continue BiPAP  Shortness of breath on exertion -This is multifactorial  Narcolepsy -Continue modafinil  PTSD history  Chronic obstructive pulmonary disease -Continue Trelegy and albuterol as needed  Plan/Recommendations:  Graded activities as tolerated  Continue BiPAP  Continue modafinil  Continue bronchodilators  Had a breathing study done recently, will get a copy of this, pulmonary rehab referral   Sherrilyn Rist MD [facility] 09/30/2022, 6:48 AM  CC: Rogers Blocker, MD

## 2022-10-04 ENCOUNTER — Encounter: Payer: Self-pay | Admitting: Physician Assistant

## 2022-10-04 ENCOUNTER — Ambulatory Visit (INDEPENDENT_AMBULATORY_CARE_PROVIDER_SITE_OTHER): Payer: No Typology Code available for payment source | Admitting: Physician Assistant

## 2022-10-04 VITALS — BP 154/70 | HR 71 | Resp 18 | Ht 73.5 in | Wt 244.0 lb

## 2022-10-04 DIAGNOSIS — R413 Other amnesia: Secondary | ICD-10-CM

## 2022-10-04 NOTE — Patient Instructions (Signed)
It was a pleasure to see you today at our office.   Recommendations:  Return in 1 month  Neurocognitive testing Aug 27 at 11:30 follow up with Sharene Butters   Whom to call:  Memory  decline, memory medications: Call our office [phone]   For psychiatric meds, mood meds: Please have your primary care physician manage these medications.      For assessment of decision of mental capacity and competency:  Call Dr. Anthoney Harada, geriatric psychiatrist at [phone]  For guidance in geriatric dementia issues please call [facility] [phone]     If you have any severe symptoms of a stroke, or other severe issues such as confusion,severe chills or fever, etc call 911 or go to the ER as you may need to be evaluated further       RECOMMENDATIONS FOR ALL PATIENTS WITH MEMORY PROBLEMS: 1. Continue to exercise (Recommend 30 minutes of walking everyday, or 3 hours every week) 2. Increase social interactions - continue going to [place] and enjoy social gatherings with friends and family 3. Eat healthy, avoid fried foods and eat more fruits and vegetables 4. Maintain adequate blood pressure, blood sugar, and blood cholesterol level. Reducing the risk of stroke and cardiovascular disease also helps promoting better memory. 5. Avoid stressful situations. Live a simple life and avoid aggravations. Organize your time and prepare for the next day in anticipation. 6. Sleep well, avoid any interruptions of sleep and avoid any distractions in the bedroom that may interfere with adequate sleep quality 7. Avoid sugar, avoid sweets as there is a strong link between excessive sugar intake, diabetes, and cognitive impairment We discussed the Mediterranean diet, which has been shown to help patients reduce the risk of progressive memory disorders and reduces cardiovascular risk. This includes eating fish, eat fruits and green leafy vegetables, nuts like almonds and hazelnuts, walnuts, and  also use olive oil. Avoid fast foods and fried foods as much as possible. Avoid sweets and sugar as sugar use has been linked to worsening of memory function.  There is always a concern of gradual progression of memory problems. If this is the case, then we may need to adjust level of care according to patient needs. Support, both to the patient and caregiver, should then be put into place.    The Alzheimer's Association is here all day, every day for people facing Alzheimer's disease through our free 24/7 Helpline: [phone] [phone]. The Helpline provides reliable information and support to all those who need assistance, such as individuals living with memory loss, Alzheimer's or other dementia, caregivers, health care professionals and the public.  Our highly trained and knowledgeable staff can help you with: Understanding memory loss, dementia and Alzheimer's  Medications and other treatment options  General information about aging and brain health  Skills to provide quality care and to find the best care from professionals  Legal, financial and living-arrangement decisions Our Helpline also features: Confidential care consultation provided by master's level clinicians who can help with decision-making support, crisis assistance and education on issues families face every day  Help in a caller's preferred language using our translation service that features more than 200 languages and dialects  Referrals to local community programs, services and ongoing support     FALL PRECAUTIONS: Be cautious when walking. Scan the area for obstacles that may increase the risk of trips and falls. When getting up in the mornings, sit up at the edge of the bed for a few minutes before  getting out of bed. Consider elevating the bed at the head end to avoid drop of blood pressure when getting up. Walk always in a well-lit room (use night lights in the walls). Avoid area rugs or power cords from appliances in the  middle of the walkways. Use a walker or a cane if necessary and consider physical therapy for balance exercise. Get your eyesight checked regularly.  FINANCIAL OVERSIGHT: Supervision, especially oversight when making financial decisions or transactions is also recommended.  HOME SAFETY: Consider the safety of the kitchen when operating appliances like stoves, microwave oven, and blender. Consider having supervision and share cooking responsibilities until no longer able to participate in those. Accidents with firearms and other hazards in the house should be identified and addressed as well.   ABILITY TO BE LEFT ALONE: If patient is unable to contact 911 operator, consider using LifeLine, or when the need is there, arrange for someone to stay with patients. Smoking is a fire hazard, consider supervision or cessation. Risk of wandering should be assessed by caregiver and if detected at any point, supervision and safe proof recommendations should be instituted.  MEDICATION SUPERVISION: Inability to self-administer medication needs to be constantly addressed. Implement a mechanism to ensure safe administration of the medications.   DRIVING: Regarding driving, in patients with progressive memory problems, driving will be impaired. We advise to have someone else do the driving if trouble finding directions or if minor accidents are reported. Independent driving assessment is available to determine safety of driving.   If you are interested in the driving assessment, you can contact the following:  [employer] in [place]  [place] [phone] or [phone]      [place] refers to food and lifestyle choices that are based on the traditions of countries located on the [employer]. This way of eating has been shown to help prevent certain  conditions and improve outcomes for people who have chronic diseases, like kidney disease and heart disease. What are tips for following this plan? Lifestyle  Cook and eat meals together with your family, when possible. Drink enough fluid to keep your urine clear or pale yellow. Be physically active every day. This includes: Aerobic exercise like running or swimming. Leisure activities like gardening, walking, or housework. Get 7-8 hours of sleep each night. If recommended by your health care provider, drink red wine in moderation. This means 1 glass a day for nonpregnant women and 2 glasses a day for men. A glass of wine equals 5 oz (150 mL). Reading food labels  Check the serving size of packaged foods. For foods such as rice and pasta, the serving size refers to the amount of cooked product, not dry. Check the total fat in packaged foods. Avoid foods that have saturated fat or trans fats. Check the ingredients list for added sugars, such as corn syrup. Shopping  At the grocery store, buy most of your food from the areas near the walls of the store. This includes: Fresh fruits and vegetables (produce). Grains, beans, nuts, and seeds. Some of these may be available in unpackaged forms or large amounts (in bulk). Fresh seafood. Poultry and eggs. Low-fat dairy products. Buy whole ingredients instead of prepackaged foods. Buy fresh fruits and vegetables in-season from local farmers markets. Buy frozen fruits and vegetables in resealable bags. If you do not have access to quality fresh seafood, buy precooked frozen shrimp  or canned fish, such as tuna, salmon, or sardines. Buy small amounts of raw or cooked vegetables, salads, or olives from the deli or salad bar at your store. Stock your pantry so you always have certain foods on hand, such as olive oil, canned tuna, canned tomatoes, rice, pasta, and beans. Cooking  Cook foods with extra-virgin olive oil instead of using butter or other  vegetable oils. Have meat as a side dish, and have vegetables or grains as your main dish. This means having meat in small portions or adding small amounts of meat to foods like pasta or stew. Use beans or vegetables instead of meat in common dishes like chili or lasagna. Experiment with different cooking methods. Try roasting or broiling vegetables instead of steaming or sauteing them. Add frozen vegetables to soups, stews, pasta, or rice. Add nuts or seeds for added healthy fat at each meal. You can add these to yogurt, salads, or vegetable dishes. Marinate fish or vegetables using olive oil, lemon juice, garlic, and fresh herbs. Meal planning  Plan to eat 1 vegetarian meal one day each week. Try to work up to 2 vegetarian meals, if possible. Eat seafood 2 or more times a week. Have healthy snacks readily available, such as: Vegetable sticks with hummus. Greek yogurt. Fruit and nut trail mix. Eat balanced meals throughout the week. This includes: Fruit: 2-3 servings a day Vegetables: 4-5 servings a day Low-fat dairy: 2 servings a day Fish, poultry, or lean meat: 1 serving a day Beans and legumes: 2 or more servings a week Nuts and seeds: 1-2 servings a day Whole grains: 6-8 servings a day Extra-virgin olive oil: 3-4 servings a day Limit red meat and sweets to only a few servings a month What are my food choices? Mediterranean diet Recommended Grains: Whole-grain pasta. Brown rice. Bulgar wheat. Polenta. Couscous. Whole-wheat bread. [place]. Vegetables: Artichokes. Beets. Broccoli. Cabbage. Carrots. Eggplant. Green beans. Chard. Kale. Spinach. Onions. Leeks. Peas. Squash. Tomatoes. Peppers. Radishes. Fruits: Apples. Apricots. Avocado. Berries. Bananas. Cherries. Dates. Figs. Grapes. Lemons. Melon. Oranges. Peaches. Plums. Pomegranate. Meats and other protein foods: Beans. Almonds. Sunflower seeds. Pine nuts. Peanuts. Balm. Salmon. Scallops. Shrimp. [place]. Tilapia. Clams.  Oysters. Eggs. Dairy: Low-fat milk. Cheese. Greek yogurt. Beverages: Water. Red wine. Herbal tea. Fats and oils: Extra virgin olive oil. Avocado oil. Grape seed oil. Sweets and desserts: [place] yogurt with honey. Baked apples. Poached pears. Trail mix. Seasoning and other foods: Basil. Cilantro. Coriander. Cumin. Mint. Parsley. Sage. Rosemary. Tarragon. Garlic. Oregano. Thyme. Pepper. Balsalmic vinegar. Tahini. Hummus. Tomato sauce. Olives. Mushrooms. Limit these Grains: Prepackaged pasta or rice dishes. Prepackaged cereal with added sugar. Vegetables: Deep fried potatoes (french fries). Fruits: Fruit canned in syrup. Meats and other protein foods: Beef. Pork. Lamb. Poultry with skin. Hot dogs. Bernie[place] Salines. Dairy: Ice cream. Sour cream. Whole milk. Beverages: Juice. Sugar-sweetened soft drinks. Beer. Liquor and spirits. Fats and oils: Butter. Canola oil. Vegetable oil. Beef fat (tallow). Lard. Sweets and desserts: Cookies. Cakes. Pies. Candy. Seasoning and other foods: Mayonnaise. Premade sauces and marinades. The items listed may not be a complete list. Talk with your dietitian about what dietary choices are right for you. Summary The Mediterranean diet includes both food and lifestyle choices. Eat a variety of fresh fruits and vegetables, beans, nuts, seeds, and whole grains. Limit the amount of red meat and sweets that you eat. Talk with your health care provider about whether it is safe for you to drink red wine in moderation. This means 1 glass  a day for nonpregnant women and 2 glasses a day for men. A glass of wine equals 5 oz (150 mL). This information is not intended to replace advice given to you by your health care provider. Make sure you discuss any questions you have with your health care provider. Document Released: 03/25/2016 Document Revised: 04/27/2016 Document Reviewed: 03/25/2016 Elsevier Interactive Patient Education  2017 [place] today [address] for MRI [phone]

## 2022-10-04 NOTE — Progress Notes (Signed)
Assessment/Plan:   Memory Difficulties of multiple etiologies  [name] is a very pleasant 73 y.o.LH male p with  a history of hypertension, hyperlipidemia, OSA with a history of narcolepsy, compliant with BiPAP, GERD, microcytic anemia, ADD per patient report, diabetes with neuropathy, DJD, vitamin D deficiency, hypothyroidism, history of pituitary edema treated with bromocriptine, PTSD, major depressive disorder, anxiety followed by [facility] and psychotherapy, presenting today to discuss the MRI brain findings.  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 However, on presentation, he is memory difficulties had not progressed especially given his age (over 5 years), for which he was scheduled for repeat Neuropsych evaluation at our office. Patient was last seen on 08/20/2022 at which time his MoCA was 24/30 with delayed recall 5/5 without cueing.  MRI of the brain, personally reviewed, remarkable for early chronic microangiopathy, no significant parenchymal volume loss, no masses, without acute findings.    recommendations:   Follow up in  6 months. Patient scheduled for neurocognitive testing on March 2024 to further evaluate cognitive concerns and determine other underlying causes of memory changes, including potential contribution from sleep, anxiety, attention, PTSD or depression. Monitor driving Recommend good control of cardiovascular risk factors Continue management of sleep disorder-COPD-OSA with BiPAP as per pulmonology Continue to control mood and hallucinations as per psychiatry    Subjective:   This patient is accompanied in the office by his wife  who supplements the history. Previous records as well as any outside records available were reviewed prior to todays visit.       Any changes in memory since last visit? Denies   Initial evaluation  08/20/2022 How long did patient have memory difficulties?  He has been diagnosed with MCI" likely due to Alzheimer's Disease " ( per Neuropsych evaluation at least dating back to 2015)- but his wife reports that this may be longer about 2013 when he began forgetting recent conversations or events, asking the same question and repeating a story.  Wife asks him to repeat things, he may be experiencing disorganized thoughts. "He uses [employer] to communicate". "A caregiver has to help him get organized with things around the house more often"-she says. To what he answers "I have been always been disorganized" . Apparently, he has been on disability because these cognitive and behavioral issues were affecting his work "was having problems at [employer] office, had a breakdown".   Disoriented when walking into a room?  Patient denies. "He has a man cave where he is free to do anything he wants to do without disorganizing the whole house".   Leaving objects in unusual places?  Endorsed by his wife.   Wandering behavior? denies   Any personality changes since last visit?  He is very stressed out when she overprotects him.  She had surgery and was stressful for him; he was "lashing out to anybody, because everyone was trying to tell me what to do".  He has a  psychiatrist, and a psychotherapist whom he sees every 3 months  Any worsening depression?:  Patient is more depressed recently after his brother was diagnosed with Leverne Humbles disease and also he is experiencing stress due to wife having a diagnosis of multiple myeloma and Lupus    Hallucinations or paranoia? "For at least 50 years".  "When I am very tired, I see cars, bicycle,  and money. " "I know they are not there"-he says  Seizures?   denies    Any sleep changes?  Sleeps w BiPAP and takes meds for mental health and "sleep hard about 4 h a night" "Nice vivid dreams", Denies REM behavior or sleepwalking. Takes melatonin and Seroquel.  Sleep apnea?  BiPAP , compliant Any hygiene concerns?  denies   Independent of bathing and dressing?  Endorsed  Does the patient need help with medications? Wife is in charge   Who is in charge of the finances?  Wife is in charge     Any changes in appetite?   denies     Patient have trouble swallowing?  denies   Does the patient cook? Wife since "I got booted out because of the COPD and the neuropathy and I drop heavy things"-he says   Any headaches?  denies   Chronic back pain? Endorsed, sees Dr. Cyndy Freeze, NS, and attends pain clinic. Ambulates with difficulty?  Uses a cane to ambulate and ankle braces for stability  Recent falls or head injuries?  denies     Unilateral weakness, numbness or tingling?He has known neuropathy, scheduled for [facility] 09/08/22 Dr Cyndy Freeze.  Any tremors?"On the R hand he has mil tremors, chronic.    Any anosmia?  denies   Any incontinence of urine?  denies   Any bowel dysfunction?    denies      Patient lives  with wife History of heavy alcohol intake? denies   History of heavy tobacco use?   denies   Family history of dementia?     Mother had AD, GrandUncle (paternal) AD  Dose patient drive? No issues' " he does not like traffic, he does not like to go outside of what he knows, sense of direction sometimes is an issue"    Past Medical History:  Diagnosis Date   Allergic rhinitis    Anxiety    Carpal tunnel syndrome on right    COPD (chronic obstructive pulmonary disease) (HCC)    Coronary artery disease    Depression    Diabetes mellitus    DJD (degenerative joint disease)    Dysphagia    GERD (gastroesophageal reflux disease)    Hyperlipidemia    Hypertension    Hypothyroidism    Left inguinal hernia    Mental disorder    Narcolepsy    takes provigil   OSA on CPAP    Osteoporosis    Peripheral neuropathy    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   PTSD (post-traumatic stress disorder)    Weakness of left leg    Wears glasses       Past Surgical History:  Procedure Laterality Date   CARDIAC CATHETERIZATION     CERVICAL DISC SURGERY     x 2   COLONOSCOPY     HIP ARTHROPLASTY     right   INGUINAL HERNIA REPAIR Left 09/06/2019   Procedure: LAPAROSCOPIC LEFT INGUINAL HERNIA WITH MESH;  Surgeon: Greer Pickerel, MD;  Location: [facility];  Service: General;  Laterality: Left;   JOINT REPLACEMENT     KNEE ARTHROPLASTY     bilateral   LUMBAR LAMINECTOMY/DECOMPRESSION MICRODISCECTOMY  08/31/2011   Procedure: LUMBAR LAMINECTOMY/DECOMPRESSION MICRODISCECTOMY;  Surgeon: Otilio Connors, MD;  Location: [place] [facility];  Service: Neurosurgery;  Laterality: N/A;  LumbarThree to Lumbar Five Laminectomy, possible Lumbar Four-Five Diskectomy   SHOULDER SURGERY     left   UPPER GI  ENDOSCOPY       PREVIOUS MEDICATIONS:   CURRENT MEDICATIONS:  Outpatient Encounter Medications as of 10/04/2022  Medication Sig   albuterol (PROVENTIL) (2.5 MG/3ML) 0.083% nebulizer solution Take 3 mLs (2.5 mg total) by nebulization every 4 (four) hours as needed for wheezing or shortness of breath.   albuterol (VENTOLIN HFA) 108 (90 Base) MCG/ACT inhaler Inhale 2 puffs into the lungs every 6 (six) hours as needed for wheezing or shortness of breath.   amLODipine (NORVASC) 10 MG tablet Take 10 mg by mouth daily.   aspirin EC 81 MG tablet Take 81 mg by mouth daily. Swallow whole.   b complex vitamins capsule Take 1 capsule by mouth daily.   carbamide peroxide (DEBROX) 6.5 % OTIC solution Place 5 drops into both ears as needed (ear wax).   Cholecalciferol (VITAMIN D3) 1000 UNITS CAPS Take 1,000 Units by mouth every morning.   empagliflozin (JARDIANCE) 10 MG TABS tablet Take 1 tablet (10 mg total) by mouth daily before breakfast.   FERGON 240 (27 Fe) MG tablet Take 240 mg by mouth daily.   fexofenadine (ALLEGRA) 180 MG tablet Take 180 mg by mouth daily.   fluticasone (FLONASE) 50 MCG/ACT nasal spray Place 1 spray into both nostrils in the  morning and at bedtime.   fluticasone-salmeterol (WIXELA INHUB) 250-50 MCG/ACT AEPB Inhale 1 puff into the lungs in the morning and at bedtime.   gabapentin (NEURONTIN) 400 MG capsule Take 400 mg by mouth 3 (three) times daily.   hydrALAZINE (APRESOLINE) 50 MG tablet Take 50 mg by mouth 3 (three) times daily.   hydrOXYzine (ATARAX/VISTARIL) 10 MG tablet Take 20 mg by mouth at bedtime.   losartan (COZAAR) 100 MG tablet Take 100 mg by mouth every evening.   melatonin 3 MG TABS tablet Take 3 mg by mouth at bedtime.   meloxicam (MOBIC) 7.5 MG tablet Take 7.5 mg by mouth daily.   metFORMIN (GLUCOPHAGE) 500 MG tablet Take 500 mg by mouth daily with breakfast.   modafinil (PROVIGIL) 100 MG tablet Take 1-2 tablets (100-200 mg total) by mouth See admin instructions. 200 mg in the morning 100 mg  at noon   NON FORMULARY BIPAP at bedtime   omeprazole (PRILOSEC OTC) 20 MG tablet Take 1 tablet (20 mg total) by mouth daily.   PARoxetine (PAXIL) 40 MG tablet Take 40 mg by mouth at bedtime.   pravastatin (PRAVACHOL) 40 MG tablet Take 40 mg by mouth at bedtime.   Probiotic Product (PROBIOTIC DAILY PO) Take 1 capsule by mouth daily.   QUEtiapine (SEROQUEL) 25 MG tablet Take 50 mg by mouth at bedtime.   spironolactone (ALDACTONE) 25 MG tablet Take 50 mg by mouth daily.    tamsulosin (FLOMAX) 0.4 MG CAPS capsule Take 0.4 mg by mouth every morning.   triamcinolone ointment (KENALOG) 0.1 % Apply 1 application. topically as needed (rash).   Turmeric 400 MG CAPS Take 400 mg by mouth daily.   vitamin E 400 UNIT capsule Take 400 Units by mouth daily.   No facility-administered encounter medications on file as of 10/04/2022.          08/21/2022   10:00 AM  Montreal Cognitive Assessment   Visuospatial/ Executive (0/5) 4  Naming (0/3) 3  Attention: Read list of digits (0/2) 1  Attention: Read list of letters (0/1) 0  Attention: Serial 7 subtraction starting at 100 (0/3) 3  Language: Repeat phrase (0/2) 1   Language : Fluency (0/1) 0  Abstraction (0/2) 1  Delayed Recall (0/5) 5  Orientation (0/6) 6  Total 24  Adjusted Score (based on education) 24        No data to display             Thank you for allowing [place] the opportunity to participate in the care of this nice patient. Please do not hesitate to contact us for any questions or concerns.   Total time spent on today's visit was 24 minutes dedicated to this patient today, preparing to see patient, examining the patient, ordering tests and/or medications and counseling the patient, documenting clinical information in the EHR or other health record, independently interpreting results and communicating results to the patient/family, discussing treatment and goals, answering patient's questions and coordinating care.  Cc:  Rogers Blocker, MD  Sharene Butters 10/04/2022 6:36 AM

## 2022-10-11 ENCOUNTER — Other Ambulatory Visit: Payer: Self-pay | Admitting: Neurosurgery

## 2022-10-11 NOTE — Anesthesia Preprocedure Evaluation (Signed)
Anesthesia Evaluation  Patient identified by MRN, date of birth, ID band Patient awake    Reviewed: Allergy & Precautions, NPO status , Patient's Chart, lab work & pertinent test results  Airway Mallampati: III  TM Distance: >3 FB Neck ROM: Full    Dental  (+) Partial Upper, Partial Lower, Dental Advisory Given   Pulmonary sleep apnea (bipap) and Continuous Positive Airway Pressure Ventilation , COPD,  COPD inhaler   Pulmonary exam normal breath sounds clear to auscultation       Cardiovascular hypertension (168/70 preop, per pt normally 130s-150s SBP), Pt. on medications + CAD (nonobstructive on cath 2006)  Normal cardiovascular exam Rhythm:Regular Rate:Normal     Neuro/Psych  PSYCHIATRIC DISORDERS Anxiety Depression    Narcolepsy     GI/Hepatic Neg liver ROS,GERD  Controlled,,  Endo/Other  diabetes, Well Controlled, Type 2, Oral Hypoglycemic AgentsHypothyroidism  FS 108 preop  Renal/GU Renal InsufficiencyRenal diseaseCr 1.42  negative genitourinary   Musculoskeletal  (+) Arthritis , Osteoarthritis,    Abdominal   Peds  Hematology negative hematology ROS (+)   Anesthesia Other Findings   Reproductive/Obstetrics negative OB ROS                             Anesthesia Physical Anesthesia Plan  ASA: 2  Anesthesia Plan: MAC   Post-op Pain Management: Tylenol PO (pre-op)*   Induction:   PONV Risk Score and Plan: 2 and Propofol infusion and TIVA  Airway Management Planned: Natural Airway and Simple Face Mask  Additional Equipment: None  Intra-op Plan:   Post-operative Plan:   Informed Consent: I have reviewed the patients History and Physical, chart, labs and discussed the procedure including the risks, benefits and alternatives for the proposed anesthesia with the patient or authorized representative who has indicated his/her understanding and acceptance.       Plan  Discussed with: CRNA  Anesthesia Plan Comments:        Anesthesia Quick Evaluation

## 2022-10-12 ENCOUNTER — Other Ambulatory Visit: Payer: Self-pay

## 2022-10-12 ENCOUNTER — Ambulatory Visit (HOSPITAL_BASED_OUTPATIENT_CLINIC_OR_DEPARTMENT_OTHER): Payer: Medicare PPO | Admitting: Anesthesiology

## 2022-10-12 ENCOUNTER — Encounter (HOSPITAL_COMMUNITY): Payer: Self-pay | Admitting: Neurosurgery

## 2022-10-12 ENCOUNTER — Ambulatory Visit (HOSPITAL_COMMUNITY)
Admission: RE | Admit: 2022-10-12 | Discharge: 2022-10-12 | Disposition: A | Discharge status: Home / Self Care | Payer: No Typology Code available for payment source | Source: Ambulatory Visit | Attending: Neurosurgery | Admitting: Neurosurgery

## 2022-10-12 ENCOUNTER — Ambulatory Visit (HOSPITAL_COMMUNITY): Payer: Medicare PPO | Admitting: Anesthesiology

## 2022-10-12 ENCOUNTER — Encounter (HOSPITAL_COMMUNITY)
Admission: RE | Disposition: A | Discharge status: Home / Self Care | Payer: Self-pay | Source: Ambulatory Visit | Attending: Neurosurgery

## 2022-10-12 DIAGNOSIS — E1142 Type 2 diabetes mellitus with diabetic polyneuropathy: Secondary | ICD-10-CM | POA: Diagnosis not present

## 2022-10-12 DIAGNOSIS — G5601 Carpal tunnel syndrome, right upper limb: Secondary | ICD-10-CM | POA: Diagnosis present

## 2022-10-12 DIAGNOSIS — I10 Essential (primary) hypertension: Secondary | ICD-10-CM | POA: Diagnosis not present

## 2022-10-12 DIAGNOSIS — G4733 Obstructive sleep apnea (adult) (pediatric): Secondary | ICD-10-CM | POA: Insufficient documentation

## 2022-10-12 DIAGNOSIS — E039 Hypothyroidism, unspecified: Secondary | ICD-10-CM | POA: Insufficient documentation

## 2022-10-12 DIAGNOSIS — K219 Gastro-esophageal reflux disease without esophagitis: Secondary | ICD-10-CM | POA: Insufficient documentation

## 2022-10-12 DIAGNOSIS — J449 Chronic obstructive pulmonary disease, unspecified: Secondary | ICD-10-CM | POA: Insufficient documentation

## 2022-10-12 DIAGNOSIS — G47419 Narcolepsy without cataplexy: Secondary | ICD-10-CM | POA: Insufficient documentation

## 2022-10-12 DIAGNOSIS — Z7984 Long term (current) use of oral hypoglycemic drugs: Secondary | ICD-10-CM | POA: Diagnosis not present

## 2022-10-12 DIAGNOSIS — I2510 Atherosclerotic heart disease of native coronary artery without angina pectoris: Secondary | ICD-10-CM | POA: Diagnosis not present

## 2022-10-12 HISTORY — PX: CARPAL TUNNEL RELEASE: SHX101

## 2022-10-12 LAB — BASIC METABOLIC PANEL
Anion gap: 11 (ref 5–15)
BUN: 15 mg/dL (ref 8–23)
CO2: 25 mmol/L (ref 22–32)
Calcium: 9.5 mg/dL (ref 8.9–10.3)
Chloride: 104 mmol/L (ref 98–111)
Creatinine, Ser: 1.42 mg/dL — ABNORMAL HIGH (ref 0.61–1.24)
GFR, Estimated: 53 mL/min — ABNORMAL LOW (ref >60)
Glucose, Bld: 104 mg/dL — ABNORMAL HIGH (ref 70–99)
Potassium: 3.6 mmol/L (ref 3.5–5.1)
Sodium: 140 mmol/L (ref 135–145)

## 2022-10-12 LAB — GLUCOSE, CAPILLARY
Glucose-Capillary: 108 mg/dL — ABNORMAL HIGH (ref 70–99)
Glucose-Capillary: 114 mg/dL — ABNORMAL HIGH (ref 70–99)
Glucose-Capillary: 120 mg/dL — ABNORMAL HIGH (ref 70–99)

## 2022-10-12 LAB — CBC
HCT: 40.4 % (ref 39.0–52.0)
Hemoglobin: 12.6 g/dL — ABNORMAL LOW (ref 13.0–17.0)
MCH: 23.9 pg — ABNORMAL LOW (ref 26.0–34.0)
MCHC: 31.2 g/dL (ref 30.0–36.0)
MCV: 76.5 fL — ABNORMAL LOW (ref 80.0–100.0)
Platelets: 302 10*3/uL (ref 150–400)
RBC: 5.28 MIL/uL (ref 4.22–5.81)
RDW: 15.4 % (ref 11.5–15.5)
WBC: 7.6 10*3/uL (ref 4.0–10.5)
nRBC: 0 % (ref 0.0–0.2)

## 2022-10-12 SURGERY — CARPAL TUNNEL RELEASE
Anesthesia: Monitor Anesthesia Care | Laterality: Right

## 2022-10-12 MED ORDER — PROPOFOL 10 MG/ML IV BOLUS
INTRAVENOUS | Status: AC
  Filled 2022-10-12: qty 20

## 2022-10-12 MED ORDER — BACITRACIN ZINC 500 UNIT/GM EX OINT
TOPICAL_OINTMENT | CUTANEOUS | Status: AC
  Filled 2022-10-12: qty 28.35

## 2022-10-12 MED ORDER — LACTATED RINGERS IV SOLN: INTRAVENOUS | Status: DC

## 2022-10-12 MED ORDER — LIDOCAINE-EPINEPHRINE 0.5 %-1:200000 IJ SOLN
INTRAMUSCULAR | Status: AC
  Filled 2022-10-12: qty 50

## 2022-10-12 MED ORDER — 0.9 % SODIUM CHLORIDE (POUR BTL) OPTIME
TOPICAL | Status: DC | PRN
  Administered 2022-10-12: 1000 mL

## 2022-10-12 MED ORDER — LIDOCAINE 2% (20 MG/ML) 5 ML SYRINGE
INTRAMUSCULAR | Status: DC | PRN
  Administered 2022-10-12: 50 mg via INTRAVENOUS

## 2022-10-12 MED ORDER — INSULIN ASPART 100 UNIT/ML IJ SOLN: 0.0000 [IU] | INTRAMUSCULAR | Status: DC | PRN

## 2022-10-12 MED ORDER — CEFAZOLIN SODIUM-DEXTROSE 2-3 GM-%(50ML) IV SOLR
INTRAVENOUS | Status: DC | PRN
  Administered 2022-10-12: 2 g via INTRAVENOUS

## 2022-10-12 MED ORDER — MIDAZOLAM HCL 2 MG/2ML IJ SOLN
INTRAMUSCULAR | Status: AC
  Filled 2022-10-12: qty 2

## 2022-10-12 MED ORDER — FENTANYL CITRATE (PF) 250 MCG/5ML IJ SOLN
INTRAMUSCULAR | Status: DC | PRN
  Administered 2022-10-12 (×2): 25 ug via INTRAVENOUS

## 2022-10-12 MED ORDER — ORAL CARE MOUTH RINSE: 15.0000 mL | Freq: Once | OROMUCOSAL | Status: AC

## 2022-10-12 MED ORDER — ACETAMINOPHEN-CODEINE 300-30 MG PO TABS: 1.0000 | ORAL_TABLET | Freq: Four times a day (QID) | ORAL | 0 refills | Status: AC | PRN

## 2022-10-12 MED ORDER — CHLORHEXIDINE GLUCONATE 0.12 % MT SOLN
15.0000 mL | Freq: Once | OROMUCOSAL | Status: AC
  Administered 2022-10-12: 15 mL via OROMUCOSAL
  Filled 2022-10-12: qty 15

## 2022-10-12 MED ORDER — PROPOFOL 500 MG/50ML IV EMUL
INTRAVENOUS | Status: DC | PRN
  Administered 2022-10-12: 100 ug/kg/min via INTRAVENOUS

## 2022-10-12 MED ORDER — FENTANYL CITRATE (PF) 250 MCG/5ML IJ SOLN
INTRAMUSCULAR | Status: AC
  Filled 2022-10-12: qty 5

## 2022-10-12 MED ORDER — LIDOCAINE-EPINEPHRINE 0.5 %-1:200000 IJ SOLN
INTRAMUSCULAR | Status: DC | PRN
  Administered 2022-10-12: 8 mL

## 2022-10-12 MED ORDER — ACETAMINOPHEN 500 MG PO TABS
1000.0000 mg | ORAL_TABLET | Freq: Once | ORAL | Status: AC
  Administered 2022-10-12: 1000 mg via ORAL
  Filled 2022-10-12: qty 2

## 2022-10-12 SURGICAL SUPPLY — 39 items
BAG COUNTER SPONGE SURGICOUNT (BAG) ×1 IMPLANT
BAG SPNG CNTER NS LX DISP (BAG) ×1
BLADE SURG 15 STRL LF DISP TIS (BLADE) ×1 IMPLANT
BLADE SURG 15 STRL SS (BLADE) ×1
BNDG CMPR 75X41 PLY HI ABS (GAUZE/BANDAGES/DRESSINGS) ×1
BNDG ELASTIC 3X5.8 VLCR STR LF (GAUZE/BANDAGES/DRESSINGS) ×1 IMPLANT
BNDG GAUZE DERMACEA FLUFF 4 (GAUZE/BANDAGES/DRESSINGS) ×1 IMPLANT
BNDG GZE DERMACEA 4 6PLY (GAUZE/BANDAGES/DRESSINGS) ×1
BNDG STRETCH 4X75 STRL LF (GAUZE/BANDAGES/DRESSINGS) IMPLANT
CABLE BIPOLOR RESECTION CORD (MISCELLANEOUS) ×1 IMPLANT
DRAPE EXTREMITY T 121X128X90 (DISPOSABLE) ×1 IMPLANT
DRAPE HALF SHEET 40X57 (DRAPES) ×1 IMPLANT
DRSG EMULSION OIL 3X3 NADH (GAUZE/BANDAGES/DRESSINGS) IMPLANT
DURAPREP 26ML APPLICATOR (WOUND CARE) ×1 IMPLANT
GAUZE 4X4 16PLY ~~LOC~~+RFID DBL (SPONGE) ×1 IMPLANT
GLOVE ECLIPSE 6.5 STRL STRAW (GLOVE) ×1 IMPLANT
GOWN STRL REUS W/ TWL LRG LVL3 (GOWN DISPOSABLE) ×2 IMPLANT
GOWN STRL REUS W/ TWL XL LVL3 (GOWN DISPOSABLE) IMPLANT
GOWN STRL REUS W/TWL 2XL LVL3 (GOWN DISPOSABLE) IMPLANT
GOWN STRL REUS W/TWL LRG LVL3 (GOWN DISPOSABLE) ×1
GOWN STRL REUS W/TWL XL LVL3 (GOWN DISPOSABLE) ×1
KIT BASIN OR (CUSTOM PROCEDURE TRAY) ×1 IMPLANT
KIT TURNOVER KIT B (KITS) ×1 IMPLANT
NDL HYPO 25X1 1.5 SAFETY (NEEDLE) ×1 IMPLANT
NEEDLE HYPO 25X1 1.5 SAFETY (NEEDLE) ×1 IMPLANT
NS IRRIG 1000ML POUR BTL (IV SOLUTION) ×1 IMPLANT
PACK BASIC III (CUSTOM PROCEDURE TRAY) ×1
PACK SRG BSC III STRL LF ECLPS (CUSTOM PROCEDURE TRAY) ×1 IMPLANT
PAD ARMBOARD 7.5X6 YLW CONV (MISCELLANEOUS) ×3 IMPLANT
SPIKE FLUID TRANSFER (MISCELLANEOUS) ×1 IMPLANT
STOCKINETTE 4X48 STRL (DRAPES) ×1 IMPLANT
SUT ETHILON 3 0 PS 1 (SUTURE) ×1 IMPLANT
SYR BULB EAR ULCER 3OZ GRN STR (SYRINGE) ×1 IMPLANT
SYR CONTROL 10ML LL (SYRINGE) ×1 IMPLANT
TOWEL GREEN STERILE (TOWEL DISPOSABLE) ×1 IMPLANT
TOWEL GREEN STERILE FF (TOWEL DISPOSABLE) ×1 IMPLANT
TUBE CONNECTING 12X1/4 (SUCTIONS) ×1 IMPLANT
UNDERPAD 30X36 HEAVY ABSORB (UNDERPADS AND DIAPERS) ×1 IMPLANT
WATER STERILE IRR 1000ML POUR (IV SOLUTION) ×1 IMPLANT

## 2022-10-12 NOTE — Anesthesia Procedure Notes (Signed)
Procedure Name: MAC Date/Time: 10/12/2022 9:48 AM  Performed by: Barrington Ellison, CRNAPre-anesthesia Checklist: Patient identified, Emergency Drugs available, Suction available, Patient being monitored and Timeout performed Patient Re-evaluated:Patient Re-evaluated prior to induction Oxygen Delivery Method: Simple face mask

## 2022-10-12 NOTE — Transfer of Care (Signed)
Immediate Anesthesia Transfer of Care Note  Patient: [name]  Procedure(s) Performed: Right CARPAL TUNNEL RELEASE (Right)  Patient Location: PACU  Anesthesia Type:MAC  Level of Consciousness: drowsy  Airway & Oxygen Therapy: Patient Spontanous Breathing  Post-op Assessment: Report given to RN  Post vital signs: Reviewed and stable  Last Vitals:  Vitals Value Taken Time  BP 121/72   Temp    Pulse 78 10/12/22 1042  Resp 17 10/12/22 1042  SpO2 97 % 10/12/22 1042  Vitals shown include unvalidated device data.  Last Pain:  Vitals:   10/12/22 0626  TempSrc: Oral  PainSc:          Complications: No notable events documented.

## 2022-10-12 NOTE — H&P (Signed)
BP (!) 168/70   Pulse 68   Temp 98.2 F (36.8 C) (Oral)   Resp 17   Ht 6' 1.5" (1.867 m)   Wt 108.9 kg   SpO2 98%   BMI 31.23 kg/m ' [name] is a 72 y.o. male With right carpal tunnel syndrome Allergies  Allergen Reactions   Misc. Sulfonamide Containing Compounds Other (See Comments)   Ace Inhibitors Swelling   Sulfa Antibiotics Swelling   Past Medical History:  Diagnosis Date   Allergic rhinitis    Anxiety    Carpal tunnel syndrome on right    COPD (chronic obstructive pulmonary disease) (HCC)    Coronary artery disease    Depression    Diabetes mellitus    DJD (degenerative joint disease)    Dysphagia    GERD (gastroesophageal reflux disease)    Hyperlipidemia    Hypertension    Hypothyroidism    Left inguinal hernia    Mental disorder    Narcolepsy    takes provigil   OSA on CPAP    Osteoporosis    Peripheral neuropathy    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   PTSD (post-traumatic stress disorder)    Weakness of left leg    Wears glasses    Past Surgical History:  Procedure Laterality Date   CARDIAC CATHETERIZATION     CERVICAL DISC SURGERY     x 2   COLONOSCOPY     HIP ARTHROPLASTY     right   INGUINAL HERNIA REPAIR Left 09/06/2019   Procedure: LAPAROSCOPIC LEFT INGUINAL HERNIA WITH MESH;  Surgeon: Greer Pickerel, MD;  Location: [facility];  Service: General;  Laterality: Left;   JOINT REPLACEMENT     KNEE ARTHROPLASTY     bilateral   LUMBAR LAMINECTOMY/DECOMPRESSION MICRODISCECTOMY  08/31/2011   Procedure: LUMBAR LAMINECTOMY/DECOMPRESSION MICRODISCECTOMY;  Surgeon: Otilio Connors, MD;  Location: [place] [facility];  Service: Neurosurgery;  Laterality: N/A;  LumbarThree to Lumbar Five Laminectomy, possible Lumbar Four-Five Diskectomy   SHOULDER SURGERY     left   UPPER GI ENDOSCOPY     Family History  Problem Relation Age of Onset   Diabetes Mother    Hypertension Brother    Hypertension Brother     Cancer Brother    There is no matching variant data for this patient.  Physical Exam Constitutional:      Appearance: Normal appearance. He is normal weight.  HENT:     Head: Normocephalic and atraumatic.     Right Ear: External ear normal.     Left Ear: External ear normal.     Nose: Nose normal.     Mouth/Throat:     Mouth: Mucous membranes are moist.     Pharynx: Oropharynx is clear.  Eyes:     Extraocular Movements: Extraocular movements intact.     Pupils: Pupils are equal, round, and reactive to light.  Cardiovascular:     Rate and Rhythm: Regular rhythm.  Pulmonary:     Effort: Pulmonary effort is normal.     Breath sounds: Normal breath sounds.  Abdominal:     General: Abdomen is flat.     Palpations: Abdomen is soft.  Musculoskeletal:        General: Normal range of motion.     Cervical back: Normal range of motion.  Skin:    General: Skin is warm and dry.  Neurological:     General: No focal deficit present.  Mental Status: He is alert and oriented to person, place, and time. Mental status is at baseline.  Psychiatric:        Mood and Affect: Mood normal.        Behavior: Behavior normal.        Thought Content: Thought content normal.        Judgment: Judgment normal.

## 2022-10-12 NOTE — Anesthesia Postprocedure Evaluation (Signed)
Anesthesia Post Note  Patient: [name]  Procedure(s) Performed: Right CARPAL TUNNEL RELEASE (Right)     Patient location during evaluation: PACU Anesthesia Type: MAC Level of consciousness: awake and alert Pain management: pain level controlled Vital Signs Assessment: post-procedure vital signs reviewed and stable Respiratory status: spontaneous breathing, nonlabored ventilation and respiratory function stable Cardiovascular status: blood pressure returned to baseline and stable Postop Assessment: no apparent nausea or vomiting Anesthetic complications: no   No notable events documented.  Last Vitals:  Vitals:   10/12/22 1100 10/12/22 1112  BP: (!) 151/70 (!) 158/70  Pulse: (!) 59 (!) 56  Resp: 11 12  Temp:    SpO2: 97% 96%    Last Pain:  Vitals:   10/12/22 1112  TempSrc:   PainSc: 0-No pain                 Pervis Hocking

## 2022-10-12 NOTE — Discharge Instructions (Signed)

## 2022-10-12 NOTE — Op Note (Signed)
   10:46 AM  PATIENT:  [name]  72 y.o. male  PRE-OPERATIVE DIAGNOSIS:  right Carpal Tunnel Syndrome  POST-OPERATIVE DIAGNOSIS:  right Carpal Tunnel Syndrome  PROCEDURE:  Procedure(s): Right CARPAL TUNNEL RELEASE  SURGEON: Surgeon(s): Ashok Pall, MD  ANESTHESIA:   local and IV sedation  EBL:  Total I/O In: 700 [I.V.:700] Out: -   COUNT:correct3  DICTATION: Tim Para Viscardi was taken to the operating room, given IV sedation, and positioned on the operating room table. male had their right upper extremity prepped and draped in a sterile manner. I infiltrated Licodcaine  [id number] strength epinephrine into the planned incision starting at the proximal palmar crease extending into the hand ~ 1.5cm. I opened the skin with a 15 blade and extended the incision through the skin into the subcutaneous tissue. I used the bipolar cautery to control the subcutaneous bleeding. I dissected sharply through the tissue using forceps also to expose the transverse carpal ligament. I divided the transverse carpal ligament sharply with the 15 blade using the forceps to protect the contents of the carpal tunnel. With the scissors I divided the ligament both proximally and distally to decompress the entire carpal tunnel. I used the scissors to dissect into the forearm to create space to divide the ligament to the proximal palmar crease, and distally into the palm.  I irrigated the wound then closed the incision with vertical interrupted vertical mattress sutures. I placed a sterile dressing, then wrapped the proximal hand and distal forearm with an ace wrap.  PLAN OF CARE: Discharge to home after PACU  PATIENT DISPOSITION:  PACU - hemodynamically stable.   Delay start of Pharmacological VTE agent (>24hrs) due to surgical blood loss or risk of bleeding:  yes

## 2022-10-13 ENCOUNTER — Encounter (HOSPITAL_COMMUNITY): Payer: Self-pay | Admitting: Neurosurgery

## 2022-10-18 ENCOUNTER — Ambulatory Visit: Payer: No Typology Code available for payment source

## 2022-10-18 ENCOUNTER — Encounter: Payer: Self-pay | Admitting: Psychology

## 2022-10-18 ENCOUNTER — Ambulatory Visit (INDEPENDENT_AMBULATORY_CARE_PROVIDER_SITE_OTHER): Payer: No Typology Code available for payment source | Admitting: Psychology

## 2022-10-18 DIAGNOSIS — E039 Hypothyroidism, unspecified: Secondary | ICD-10-CM | POA: Insufficient documentation

## 2022-10-18 DIAGNOSIS — F332 Major depressive disorder, recurrent severe without psychotic features: Secondary | ICD-10-CM | POA: Diagnosis not present

## 2022-10-18 DIAGNOSIS — Z96659 Presence of unspecified artificial knee joint: Secondary | ICD-10-CM | POA: Insufficient documentation

## 2022-10-18 DIAGNOSIS — G3184 Mild cognitive impairment, so stated: Secondary | ICD-10-CM

## 2022-10-18 DIAGNOSIS — R269 Unspecified abnormalities of gait and mobility: Secondary | ICD-10-CM | POA: Insufficient documentation

## 2022-10-18 DIAGNOSIS — M47816 Spondylosis without myelopathy or radiculopathy, lumbar region: Secondary | ICD-10-CM | POA: Insufficient documentation

## 2022-10-18 DIAGNOSIS — F329 Major depressive disorder, single episode, unspecified: Secondary | ICD-10-CM | POA: Insufficient documentation

## 2022-10-18 DIAGNOSIS — M25541 Pain in joints of right hand: Secondary | ICD-10-CM | POA: Insufficient documentation

## 2022-10-18 DIAGNOSIS — F4310 Post-traumatic stress disorder, unspecified: Secondary | ICD-10-CM

## 2022-10-18 DIAGNOSIS — G629 Polyneuropathy, unspecified: Secondary | ICD-10-CM | POA: Insufficient documentation

## 2022-10-18 DIAGNOSIS — F819 Developmental disorder of scholastic skills, unspecified: Secondary | ICD-10-CM | POA: Diagnosis not present

## 2022-10-18 DIAGNOSIS — E559 Vitamin D deficiency, unspecified: Secondary | ICD-10-CM | POA: Insufficient documentation

## 2022-10-18 DIAGNOSIS — E785 Hyperlipidemia, unspecified: Secondary | ICD-10-CM | POA: Insufficient documentation

## 2022-10-18 DIAGNOSIS — L603 Nail dystrophy: Secondary | ICD-10-CM | POA: Insufficient documentation

## 2022-10-18 DIAGNOSIS — M1990 Unspecified osteoarthritis, unspecified site: Secondary | ICD-10-CM | POA: Insufficient documentation

## 2022-10-18 DIAGNOSIS — Z961 Presence of intraocular lens: Secondary | ICD-10-CM | POA: Insufficient documentation

## 2022-10-18 DIAGNOSIS — K219 Gastro-esophageal reflux disease without esophagitis: Secondary | ICD-10-CM | POA: Insufficient documentation

## 2022-10-18 DIAGNOSIS — F411 Generalized anxiety disorder: Secondary | ICD-10-CM | POA: Insufficient documentation

## 2022-10-18 DIAGNOSIS — R4189 Other symptoms and signs involving cognitive functions and awareness: Secondary | ICD-10-CM

## 2022-10-18 DIAGNOSIS — I872 Venous insufficiency (chronic) (peripheral): Secondary | ICD-10-CM | POA: Insufficient documentation

## 2022-10-18 DIAGNOSIS — M810 Age-related osteoporosis without current pathological fracture: Secondary | ICD-10-CM | POA: Insufficient documentation

## 2022-10-18 DIAGNOSIS — M5030 Other cervical disc degeneration, unspecified cervical region: Secondary | ICD-10-CM | POA: Insufficient documentation

## 2022-10-18 DIAGNOSIS — I2510 Atherosclerotic heart disease of native coronary artery without angina pectoris: Secondary | ICD-10-CM | POA: Insufficient documentation

## 2022-10-18 DIAGNOSIS — Q761 Klippel-Feil syndrome: Secondary | ICD-10-CM | POA: Insufficient documentation

## 2022-10-18 HISTORY — DX: Mild cognitive impairment of uncertain or unknown etiology: G31.84

## 2022-10-18 NOTE — Progress Notes (Signed)
   Psychometrician Note   Cognitive testing was administered to [name] by Cruzita Lederer, B.S. (psychometrist) under the supervision of Dr. Christia Reading, Ph.D., licensed psychologist on 10/18/2022. Mr. Stinchcomb did not appear overtly distressed by the testing session per behavioral observation or responses across self-report questionnaires. Rest breaks were offered.    The battery of tests administered was selected by Dr. Christia Reading, Ph.D. with consideration to Mr. Underkoffler current level of functioning, the nature of his symptoms, emotional and behavioral responses during interview, level of literacy, observed level of motivation/effort, and the nature of the referral question. This battery was communicated to the psychometrist. Communication between Dr. Christia Reading, Ph.D. and the psychometrist was ongoing throughout the evaluation and Dr. Christia Reading, Ph.D. was immediately accessible at all times. Dr. Christia Reading, Ph.D. provided supervision to the psychometrist on the date of this service to the extent necessary to assure the quality of all services provided.    Keithon Caminiti Snowberger will return within approximately 1-2 weeks for an interactive feedback session with Dr. Melvyn Novas at which time his test performances, clinical impressions, and treatment recommendations will be reviewed in detail. Mr. Kuo understands he can contact our office should he require our assistance before this time.  A total of 130 minutes of billable time were spent face-to-face with Mr. Issac by the psychometrist. This includes both test administration and scoring time. Billing for these services is reflected in the clinical report generated by Dr. Christia Reading, Ph.D.  This note reflects time spent with the psychometrician and does not include test scores or any clinical interpretations made by Dr. Melvyn Novas. The full report will follow in a separate note.

## 2022-10-18 NOTE — Progress Notes (Signed)
NEUROPSYCHOLOGICAL EVALUATION [place]. [facility] Department of Neurology  Date of Evaluation: October 18, 2022  Reason for Referral:   WELLES SELIG is a 72 y.o. left-handed African-American male referred by Sharene Butters, PA-C, to characterize his current cognitive functioning and assist with diagnostic clarity and treatment planning in the context of subjective cognitive decline.   Assessment and Plan:   Clinical Impression(s): Mr. Verdugo pattern of performance is suggestive of an isolated impairment surrounding phonemic fluency. Further performance variability was exhibited across processing speed and executive functioning. While he did have trouble differentiating between words across a list-based recognition task, performances across all other aspects of memory testing were appropriate. Performances were also appropriate relative to age-matched peers and premorbid intellectual estimations across attention/concentration, safety/judgment, receptive language, semantic fluency, confrontation naming, and visuospatial abilities. Mr. Hoggatt largely denied difficulties completing instrumental activities of daily living (ADLs) independently and his wife was in agreement. As such, given evidence for cognitive dysfunction described above, he meets criteria for a Mild Neurocognitive Disorder ("mild cognitive impairment") at the present time.  The etiology for ongoing dysfunction is multifactorial in nature. As outlined in old records provided by his wife, concerns were expressed surrounding an undiagnosed learning disability as far back as the mid to late 1990s. Testing later revealed this to be true, describing an unspecified learning disability favoring trouble with reading and reading comprehension. During the current evaluation, Mr. Hoots noted a longstanding weakness across reading and reading comprehension, as well as highlighted that he was a very "hands on" learner in  early academic settings, aligning with previous conclusions. There are also patterns across his current more comprehensive neuropsychological evaluation which further align, including a relative weakness across verbal tasks and word reading/pronunciation. Taken together, weaknesses across verbal abilities as seen throughout the current evaluation are certainly likely to have at least some longstanding basis to them.  Previous records also suggest concerns surrounding severe depressive experiences dating back to 1979, as well as post-traumatic stress disorder (PTSD) formally diagnosed in 2016 (but likely present for a far longer duration of time). He and his wife described a long history of ongoing psychiatric distress with periods of heightened emotional outbursts surrounding increased life-related stressors. While he described his current mood as "all right" during the current interview, responses across mood-related questionnaires suggested symptoms of moderate anxiety and severe depression during the past 1-2 weeks.   Taken together, the combination of a reading/verbal based learning disability and severe psychiatric distress can certainly explain primary patterns of variability across the current neuropsychological evaluation. Difficulties could additionally be exacerbated by his numerous medical ailments (e.g., hypertension, coronary artery disease, type II diabetes, hypothyroidism, hyperlipidemia), chronic pain, and polypharmacy. While a prior [facility]-based neurologist diagnosed him with memory loss and concerns for Alzheimer's disease back in 2015, this would appear an erroneous diagnosis. Exceedingly minimal testing at that time did not suggest Alzheimer's disease patterns of performance then (despite what his then-neurologist incorrectly suggested) and current testing certainly does not suggest ongoing difficulties consistent with Alzheimer's disease presently. This diagnosis would have come when Mr.  Pigue was around 72 years old, which would be a very rare presentation, and Mr. Stellmacher reported stability rather than progressive decline over the past nine years also aligns with minimal concerns for this illness being currently present. Continued medical monitoring will be important moving forward.   Recommendations: A repeat neuropsychological evaluation in 18-24 months (or sooner if functional decline is noted) is recommended to assess the trajectory of  future cognitive decline should it occur. This will also aid in future efforts towards improved diagnostic clarity.  A combination of medication and psychotherapy has been shown to be most effective at treating symptoms of anxiety and depression. As such, Mr. Kellen is encouraged to speak with his prescribing physician regarding medication adjustments to optimally manage these symptoms. Likewise, Mr. Beh is encouraged to continued working with his individual therapist/consider regularly. He would benefit from an active and collaborative therapeutic environment, rather than one purely supportive in nature.  Mr. Arntzen is encouraged to attend to lifestyle factors for brain health (e.g., regular physical exercise, good nutrition habits and consideration of the MIND-DASH diet, regular participation in cognitively-stimulating activities, and general stress management techniques), which are likely to have benefits for both emotional adjustment and cognition. In fact, in addition to promoting good general health, regular exercise incorporating aerobic activities (e.g., brisk walking, jogging, cycling, etc.) has been demonstrated to be a very effective treatment for depression and stress, with similar efficacy rates to both antidepressant medication and psychotherapy. Optimal control of vascular risk factors (including safe cardiovascular exercise and adherence to dietary recommendations) is encouraged. Likewise, continued compliance with his CPAP  machine will also be important. Continued participation in activities which provide mental stimulation and social interaction is also recommended.   Memory can be improved using internal strategies such as rehearsal, repetition, chunking, mnemonics, association, and imagery. External strategies such as written notes in a consistently used memory journal, visual and nonverbal auditory cues such as a calendar on the refrigerator or appointments with alarm, such as on a cell phone, can also help maximize recall.    When learning new information, he would benefit from information being broken up into small, manageable pieces. he may also find it helpful to articulate the material in his own words and in a context to promote encoding at the onset of a new task. This material may need to be repeated multiple times to promote encoding.  To address problems with processing speed, he may wish to consider:   -Ensuring that he is alerted when essential material or instructions are being presented   -Adjusting the speed at which new information is presented   -Allowing for more time in comprehending, processing, and responding in conversation   -Repeating and paraphrasing instructions or conversations aloud  To address problems with fluctuating attention and/or executive dysfunction, he may wish to consider:   -Avoiding external distractions when needing to concentrate   -Limiting exposure to fast paced environments with multiple sensory demands   -Writing down complicated information and using checklists   -Attempting and completing one task at a time (i.e., no multi-tasking)   -Verbalizing aloud each step of a task to maintain focus   -Taking frequent breaks during the completion of steps/tasks to avoid fatigue   -Reducing the amount of information considered at one time   -Scheduling more difficult activities for a time of day where he is usually most alert  Review of Records:   Mr. Sesler was seen  for an initial psychological intake assessment Hyman Hopes, Ph.D.) on 03/26/1996. Records suggest that Mr. Castor had been experiencing bouts of depression as early as 1 (which lasted for approximately six months), with several recurrences between the years of 1984 and 1994. Dr. Mancel Bale described Mr. Tinker "current" bout of depression being present since 1995. Medication adjustments were recommended at that time. It was also noted that during the time Mr. Roedl spent with Dr. Mancel Bale, he expressed a concern  for an underlying learning disability. Dr. Mancel Bale noted referring Mr. Collis to a Dr. Glennon Mac who performed psychological testing (results were unavailable for review). Ultimately, an unspecified learning disability was suspected based upon this testing, focusing on difficulties with reading, reading comprehension, and poor concentration. Mr. Tringale concluded care with Dr. Mancel Bale on 12/04/1996.   Mr. Aguillar was seen by [facility] Virgia Land, M.D.) on 06/14/2014. At that time, cognitive dysfunction was said to have started about two years prior. Examples included being more repetitive in conversation, misplacing objects in his environment, and trouble recalling past events. He was described as having a longstanding history of depression and anxiety and may occasionally have outbursts of anger 1-2 times per month. Driving and other ADLs were described as intact. At that time, he was diagnosed with a mild cognitive impairment with memory loss and concerns were expressed for underlying Alzheimer's disease. However, I was unable to locate sufficient objective evidence to warrant this diagnosis and understand where subsequent concerns originated from.  Mr. Dobis completed a psychological evaluation Amada Jupiter, Ph.D.) on 04/30/2015. Based upon this evaluation, he was formally diagnosed with post-traumatic stress disorder (PTSD). Mr. Amburn described intrusive recollections of events  which occurred while he was in the [employer], including recurrent dreams of related events and instances where he feels like the traumatic event is still occurring. As a result of his traumatic experiences, he has a tendency to avoid thoughts, feelings, and conversations associated with his trauma, he avoids activities, places, and people that may arouse recollections of his trauma, and intermittently experiences diminished interest in significant activities. Outpatient psychotherapy was recommended to directly address symptoms of PTSD. Specific to cognition and memory, a brief screening instrument did not suggest any concerns for immediate or short-term memory difficulties.   Mr. Brasuell was most recently seen by [facility] Sharene Butters, [place]) on 08/20/2022 for an evaluation of memory loss. Examples included trouble recalling recent conversations or events, being repetitive in conversation, and trouble with organization. Longstanding symptoms of depression, anxiety, and PTSD were reported with varying levels of treatment over the years. Some stressors have included his brother's diagnosis of ALS and his wife's previous diagnosis of multiple myeloma and lupus. Performance on a brief cognitive screening instrument (MOCA) was 24/30 (5/5 across delayed recall). Ms. Shawn Route expressed skepticism surrounding prior Alzheimer's disease concerns. Ultimately, Mr. Hardister was referred for a comprehensive neuropsychological evaluation to characterize his cognitive abilities and to assist with diagnostic clarity and treatment planning.   Brain MRI on 09/25/2004 was unremarkable. Brain MRI on 09/21/2022 was also unremarkable. It did suggest "minimal" nonspecific white matter changes.   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   Hyperlipidemia    Hypothyroidism    Klippel-Feil  sequence    Left inguinal hernia    Low back pain 12/12/2019   Lumbar spondylosis    Major depressive disorder    Nonspecific abnormal electrocardiogram (ECG) (EKG)    Obstructive sleep apnea 12/15/2015   Osteoarthritis    Osteoporosis    Pain in joints of right hand    Peripheral neuropathy    Peripheral venous insufficiency    Pituitary abnormality    pituitary edema; takes bromocriptine   PTSD (post-traumatic stress disorder) 11/25/2010   Spinal stenosis of lumbar region 09/14/2018   Type 2 diabetes mellitus without complications [id number]   Vitamin D deficiency    Weakness of left leg    Wears glasses     Past Surgical History:  Procedure Laterality Date   CARDIAC CATHETERIZATION     CARPAL TUNNEL RELEASE Right 10/12/2022   Procedure: Right CARPAL TUNNEL RELEASE;  Surgeon: Ashok Pall, MD;  Location: [facility];  Service: Neurosurgery;  Laterality: Right;  RM 20   CERVICAL DISC SURGERY     x 2   COLONOSCOPY     HIP ARTHROPLASTY     right   INGUINAL HERNIA REPAIR Left 09/06/2019   Procedure: LAPAROSCOPIC LEFT INGUINAL HERNIA WITH MESH;  Surgeon: Greer Pickerel, MD;  Location: [facility];  Service: General;  Laterality: Left;   JOINT REPLACEMENT     KNEE ARTHROPLASTY     bilateral   LUMBAR LAMINECTOMY/DECOMPRESSION MICRODISCECTOMY  08/31/2011   Procedure: LUMBAR  LAMINECTOMY/DECOMPRESSION MICRODISCECTOMY;  Surgeon: Otilio Connors, MD;  Location: [facility];  Service: Neurosurgery;  Laterality: N/A;  LumbarThree to Lumbar Five Laminectomy, possible Lumbar Four-Five Diskectomy   SHOULDER SURGERY     left   UPPER GI ENDOSCOPY      Current Outpatient Medications:    acetaminophen-codeine (TYLENOL #3) 300-30 MG tablet, Take 1 tablet by mouth every 6 (six) hours as needed for up to 8 days for moderate pain., Disp: 30 tablet, Rfl: 0   albuterol (PROVENTIL) (2.5 MG/3ML) 0.083% nebulizer solution, Take 3 mLs (2.5 mg total) by nebulization every 4 (four) hours as needed for wheezing or shortness of breath. (Patient taking differently: Take 2.5 mg by nebulization in the morning, at noon, in the evening, and at bedtime.), Disp: 1080 mL, Rfl: 2   albuterol (VENTOLIN HFA) 108 (90 Base) MCG/ACT inhaler, Inhale 2 puffs into the lungs every 6 (six) hours as needed for wheezing or shortness of breath., Disp: 8 g, Rfl: 5   amLODipine (NORVASC) 10 MG tablet, Take 10 mg by mouth daily., Disp: , Rfl:    aspirin EC 81 MG tablet, Take 81 mg by mouth daily. Swallow whole., Disp: , Rfl:    b complex vitamins capsule, Take 1 capsule by mouth daily., Disp: , Rfl:    carbamide peroxide (DEBROX) 6.5 % OTIC solution, Place 5 drops into both ears as needed (ear wax)., Disp: , Rfl:    Cholecalciferol (VITAMIN D3) 1000 UNITS CAPS, Take 1,000 Units by mouth every morning., Disp: , Rfl:    empagliflozin (JARDIANCE) 10 MG TABS tablet, Take 1 tablet (10 mg total) by mouth daily before breakfast., Disp: 90 tablet, Rfl: 0   FERGON 240 (27 Fe) MG tablet, Take 240 mg by mouth daily., Disp: , Rfl:    fexofenadine (ALLEGRA) 180 MG tablet, Take 180 mg by mouth daily., Disp: , Rfl:    fluticasone (FLONASE) 50 MCG/ACT nasal spray, Place 1 spray into both nostrils in the morning and at bedtime., Disp: , Rfl:  fluticasone-salmeterol (WIXELA INHUB) 250-50 MCG/ACT AEPB, Inhale 1 puff into the lungs in  the morning and at bedtime., Disp: 1 each, Rfl: 5   gabapentin (NEURONTIN) 400 MG capsule, Take 400 mg by mouth 3 (three) times daily., Disp: , Rfl:    hydrALAZINE (APRESOLINE) 50 MG tablet, Take 50 mg by mouth 3 (three) times daily., Disp: , Rfl:    hydrOXYzine (ATARAX/VISTARIL) 10 MG tablet, Take 20 mg by mouth at bedtime., Disp: , Rfl:    losartan (COZAAR) 100 MG tablet, Take 100 mg by mouth every evening., Disp: , Rfl:    melatonin 3 MG TABS tablet, Take 3 mg by mouth at bedtime., Disp: , Rfl:    metFORMIN (GLUCOPHAGE) 500 MG tablet, Take 500 mg by mouth daily with breakfast., Disp: , Rfl:    modafinil (PROVIGIL) 100 MG tablet, Take 1-2 tablets (100-200 mg total) by mouth See admin instructions. 200 mg in the morning 100 mg  at noon (Patient taking differently: Take 100-200 mg by mouth See admin instructions. Take 200 mg in the morning 100 mg  at noon), Disp: 90 tablet, Rfl: 2   NON FORMULARY, BIPAP at bedtime, Disp: , Rfl:    omeprazole (PRILOSEC OTC) 20 MG tablet, Take 1 tablet (20 mg total) by mouth daily., Disp: 30 tablet, Rfl: 2   PARoxetine (PAXIL) 40 MG tablet, Take 40 mg by mouth at bedtime., Disp: , Rfl:    pravastatin (PRAVACHOL) 40 MG tablet, Take 40 mg by mouth at bedtime., Disp: , Rfl:    Probiotic Product (PROBIOTIC DAILY PO), Take 1 capsule by mouth daily., Disp: , Rfl:    QUEtiapine (SEROQUEL) 25 MG tablet, Take 50 mg by mouth at bedtime., Disp: , Rfl:    spironolactone (ALDACTONE) 25 MG tablet, Take 50 mg by mouth daily. , Disp: , Rfl:    tamsulosin (FLOMAX) 0.4 MG CAPS capsule, Take 0.4 mg by mouth every morning., Disp: , Rfl:    triamcinolone ointment (KENALOG) 0.1 %, Apply 1 application  topically daily as needed (rash)., Disp: , Rfl:    Turmeric 400 MG CAPS, Take 400 mg by mouth daily., Disp: , Rfl:    vitamin E 400 UNIT capsule, Take 400 Units by mouth daily., Disp: , Rfl:   Clinical Interview:   The following information was obtained during a clinical interview with  Mr. Rummer and his wife prior to cognitive testing.  Cognitive Symptoms: Decreased short-term memory: Endorsed. When asked about current memory concerns, Mr. Swarr generally minimized concerns, stating "I think I do." He described some trouble recalling names and perhaps occasional difficulties misplacing objects in his environment. His wife did express greater memory concerns, highlighting that he is repetitive in conversation at times and has trouble recalling details of past conversations. They both stated that memory dysfunction has been present for many years and has seemed stable over time.  Decreased long-term memory: Denied. Decreased attention/concentration: Endorsed. He reported a longstanding history of trouble with sustained attention and being easily distracted, dating back to childhood and early adolescence. He did not report a formal ADHD diagnosis or previous testing. He alluded to these difficulties potentially worsening over time.  Reduced processing speed: Variable. He noted some periods of time where his thinking may feel rapid and over-stimulated, while other times it may seem slowed and foggy.  Difficulties with executive functions: Endorsed. He reported ongoing trouble with organization, as well as some difficulties with multi-tasking. They denied trouble with impulsivity or any severe personality changes.  Difficulties with  emotion regulation: Endorsed. They described a history of angry outbursts over the years, generally isolated to periods of significant stress. His most recent "meltdown" was said to have occurred around 2015-2016. He described significant stressors surrounding his wife's health and cancer diagnosis at that time, as well as concerns for his brother having ALS. He noted a tendency to become over-protective and belligerent towards others. However, no similar experiences were described by his wife since that time.   Difficulties with receptive language:  Denied. Difficulties with word finding: Endorsed. Decreased visuoperceptual ability: Denied.  Difficulties completing ADLs: Denied. He manages his medications independently. His wife manages finances and bill paying which is fairly longstanding in nature. He continues to drive without difficulty. His wife did note that he does not like to drive in traffic or on busier roads and generally sticks to known and familiar locations.   Additional Medical History: History of traumatic brain injury/concussion: Denied. History of stroke: Denied. History of seizure activity: Denied. History of known exposure to toxins: Unclear. Medical records do suggest prior potential asbestos exposure.  Symptoms of chronic pain: Endorsed. He described ongoing neck and back pain which can impact his day-to-day functioning and balance. Medical records also suggest some concerns surrounding neuropathy.  Experience of frequent headaches/migraines: Denied. He did report a remote history of migraine headaches which subsided naturally over time.  Frequent instances of dizziness/vertigo: Denied.  Sensory changes: He wears glasses with benefit. Other sensory changes/difficulties (e.g., hearing, taste, smell) were denied.  Balance/coordination difficulties: Endorsed. He reported mild balance concerns, generally attributed to a combination of neck/back pain and neuropathy. He denied any recent falls and one side of the body was not said to be worse than the other.  Other motor difficulties: Largely denied. His wife reported very rare action tremors involving his left hand.  Sleep History: Estimated hours obtained each night: 7-8 hours.  Difficulties falling asleep: Denied. He does take medication to assist with sleep initiation and sustainment throughout the night.  Difficulties staying asleep: Denied. Feels rested and refreshed upon awakening: Endorsed.  History of snoring: Endorsed. History of waking up gasping for air:  Endorsed. Witnessed breath cessation while asleep: Endorsed. He has a history of obstructive sleep apnea and uses his BiPAP machine nightly.   History of vivid dreaming: Endorsed. Excessive movement while asleep: Endorsed. Instances of acting out his dreams: Endorsed. His wife reported Mr. Chevere talking in his sleep, as well as infrequent instances where he will kick or fight in his sleep. However, since starting various medications to help with sleep and mood, these experiences have greatly dissipated in frequency.   Psychiatric/Behavioral Health History: Depression: Endorsed. He acknowledged a long history of depressive symptoms dating back many years. He currently described his mood as "all right." His wife was in agreement with this, noting that since his "meltdown" in 2015-2016 as mentioned above, he has been stable and doing quite well from a stress reactivity standpoint. He continues to work with an individual therapist regularly and mood-related medications have been at least somewhat helpful over the years. Current or remote suicidal ideation, intent, or plan was denied.  Anxiety: Endorsed. He described a longstanding history of largely generalized anxious distress.  Mania: Denied. Trauma History: Endorsed. He has been formally diagnosed with PTSD (see above).  Visual/auditory hallucinations: Denied. Delusional thoughts: Denied.  Tobacco: Denied. Alcohol: He denied current alcohol consumption as well as a history of problematic alcohol abuse or dependence.  Recreational drugs: Denied.  Family History: Problem Relation Age of  Onset   Diabetes Mother    Alzheimer's disease Mother    Depression Father    ALS Father    ALS Brother    Hypertension Brother    Hypertension Brother    Cancer Brother    This information was confirmed by Mr. Splitt.  Academic/Vocational History: Highest level of educational attainment: 14 years. He completed high school and reported earning two  Associate's degrees in business administration and accounting. He described himself as an average (C) student in academic settings. Reading and reading comprehension may have represented a relative weakness in earlier academic settings. He described himself as a very "hands on" learner in academic settings.  History of developmental delay: Denied. History of grade repetition: Denied. Enrollment in special education courses: Denied. History of LD/ADHD: Denied. However, previous records do suggest a longstanding history of an unspecified reading-based language disability.   Employment: Retired. 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   Evaluation Results:   Behavioral Observations: Mr. Therriault was accompanied by his wife, arrived to his appointment on time, and was appropriately dressed and groomed. He appeared alert and oriented. Observed gait and station were within normal limits. Gross motor functioning appeared intact upon informal observation and no abnormal movements (e.g., tremors) were noted. His affect was generally relaxed and positive. Spontaneous speech was fluent and word finding difficulties were not observed during the clinical interview. Thought processes were coherent, organized, and normal in content. Insight into his cognitive difficulties appeared adequate.   During testing, sustained attention was appropriate. Task engagement was adequate and he persisted when challenged. He appeared to struggle with reading, word pronunciation, and verbal/language abilities in generally throughout the evaluation. This did appear to impact performances across several tasks (e.g., TOPF, Similarities, verbal memory, verbal fluency). Overall, Mr. Spinelli was cooperative  with the clinical interview and subsequent testing procedures.   Adequacy of Effort: The validity of neuropsychological testing is limited by the extent to which the individual being tested may be assumed to have exerted adequate effort during testing. Mr. Mccleary expressed his intention to perform to the best of his abilities and exhibited adequate task engagement and persistence. Scores across stand-alone and embedded performance validity measures were variable but generally within expectation. As such, the results of the current evaluation are believed to be a largely valid representation of Mr. Slee current cognitive functioning.  Test Results: Mr. Natoli was fully oriented at the time of the current evaluation.  Intellectual abilities based upon educational and vocational attainment were estimated to be in the below average to average range. 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   Processing speed was variable, ranging from the well below average to average normative ranges. Basic attention was below average. More complex attention (e.g., working memory) was average. Executive functioning was variable, ranging from the exceptionally low to average normative ranges. He performed in the average range across a task assessing safety and judgment.   While not directly assessed, receptive language abilities were believed to be intact. Mr. Genrich did not exhibit any difficulties comprehending task instructions and answered all questions asked of him appropriately. Assessed expressive language was somewhat variable. Phonemic fluency was exceptionally low while  both semantic fluency and confrontation naming were average.    Assessed visuospatial/visuoconstructional abilities were below average to average.    Learning (i.e., encoding) of novel verbal and visual information  was below average to average. Spontaneous delayed recall (i.e., retrieval) of previously learned information was also below average to average. Retention rates were 100% across a story learning task, 140% across a list learning task, and 83% across a shape learning task. Performance across recognition tasks was exceptionally low across a list learning task but below average to average across all other tasks, suggesting evidence for information consolidation. He appeared to conflate the initial list and the distractor list across this task, likely resulting in a lowered overall performance.    Results of emotional screening instruments suggested that recent symptoms of generalized anxiety were in the moderate range, while symptoms of depression were within the severe range. A screening instrument assessing recent sleep quality suggested the presence of minimal sleep dysfunction.  Tables of Scores:   Note: This summary of test scores accompanies the interpretive report and should not be considered in isolation without reference to the appropriate sections in the text. Descriptors are based on appropriate normative data and may be adjusted based on clinical judgment. Terms such as "Within Normal Limits" and "Outside Normal Limits" are used when a more specific description of the test score cannot be determined.       Percentile - Normative Descriptor > 98 - Exceptionally High 91-97 - Well Above Average 75-90 - Above Average 25-74 - Average 9-24 - Below Average 2-8 - Well Below Average < 2 - Exceptionally Low       Validity:   DESCRIPTOR       Dot Counting Test: --- --- Within Normal Limits  NAB EVI: --- --- Outside Normal Limits  D-KEFS Color Word Effort Index: --- --- Within Normal Limits       Orientation:      Raw Score Percentile   NAB Orientation, Form 1 29/29 --- ---       Cognitive Screening:      Raw Score Percentile   SLUMS: 23/30 --- ---       Intellectual Functioning:       Standard Score Percentile   Barona Formula Estimated Premorbid IQ: 99 47 Average        Standard Score Percentile   Test of Premorbid Functioning: 72 3 Well Below Average       Memory:     NAB Memory Module, Form 1: Standard Score/ T Score Percentile   Total Memory Index 87 19 Below Average  List Learning       Total Trials 1-3 17/36 (43) 25 Average    List B 4/12 (52) 58 Average    Short Delay Free Recall 5/12 (44) 27 Average    Long Delay Free Recall 7/12 (54) 66 Average    Retention Percentage 140 (64) 92 Well Above Average    Recognition Discriminability -1 (27) 1 Exceptionally Low  Shape Learning       Total Trials 1-3 14/27 (49) 46 Average    Delayed Recall 5/9 (49) 46 Average    Retention Percentage 83 (45) 31 Average    Recognition Discriminability 5 (42) 21 Below Average  Story Learning       Immediate Recall 46/80 (40) 16 Below Average    Delayed Recall 26/40 (44) 27 Average    Retention Percentage 100 (53) 62 Average  Daily Living Memory       Immediate  Recall 33/51 (39) 14 Below Average    Delayed Recall 9/17 (38) 12 Below Average    Retention Percentage 64 (37) 9 Below Average    Recognition Hits 9/10 (53) 62 Average       Attention/Executive Function:     Trail Making Test (TMT): Raw Score (T Score) Percentile     Part A 56 secs.,  1 error (43) 25 Average    Part B 213 secs.,  4 errors (45) 31 Average         Scaled Score Percentile   WAIS-IV Coding: 7 16 Below Average       NAB Attention Module, Form 1: T Score Percentile     Digits Forward 40 16 Below Average    Digits Backwards 43 25 Average        Scaled Score Percentile   WAIS-IV Similarities: 4 2 Well Below Average       D-KEFS Color-Word Interference Test: Raw Score (Scaled Score) Percentile     Color Naming 45 secs. (5) 5 Well Below Average    Word Reading 29 secs. (8) 25 Average    Inhibition 91 secs. (7) 16 Below Average      Total Errors 6 errors (7) 16 Below Average     Inhibition/Switching 119 secs. (5) 5 Well Below Average      Total Errors 19 errors (1) <1 Exceptionally Low       D-KEFS Verbal Fluency Test: Raw Score (Scaled Score) Percentile     Letter Total Correct 11 (3) 1 Exceptionally Low    Category Total Correct 36 (11) 63 Average    Category Switching Total Correct 9 (6) 9 Below Average    Category Switching Accuracy 7 (6) 9 Below Average      Total Set Loss Errors 5 (6) 9 Below Average      Total Repetition Errors 1 (12) 75 Above Average       NAB Executive Functions Module, Form 1: T Score Percentile     Judgment 47 38 Average       Language:     Verbal Fluency Test: Raw Score (T Score) Percentile     Phonemic Fluency (FAS) 11 (26) 1 Exceptionally Low    Animal Fluency 16 (51) 54 Average        NAB Language Module, Form 1: T Score Percentile     Naming 29/31 (46) 34 Average       Visuospatial/Visuoconstruction:      Raw Score Percentile   Clock Drawing: 10/10 --- Within Normal Limits       NAB Spatial Module, Form 1: T Score Percentile     Figure Drawing Copy 44 27 Average        Scaled Score Percentile   WAIS-IV Block Design: 6 9 Below Average       Mood and Personality:      Raw Score Percentile   [name] Depression Inventory - II: 36 --- Severe  PROMIS Anxiety Questionnaire: 21 --- Moderate       Additional Questionnaires:      Raw Score Percentile   PROMIS Sleep Disturbance Questionnaire: 9 --- None to Slight   Informed Consent and Coding/Compliance:   The current evaluation represents a clinical evaluation for the purposes previously outlined by the referral source and is in no way reflective of a forensic evaluation.   Mr. Wehe was provided with a verbal description of the nature and purpose of the present neuropsychological evaluation. Also reviewed were the foreseeable  risks and/or discomforts and benefits of the procedure, limits of confidentiality, and mandatory reporting requirements of this provider. The  patient was given the opportunity to ask questions and receive answers about the evaluation. Oral consent to participate was provided by the patient.   This evaluation was conducted by Christia Reading, Ph.D., ABPP-CN, board certified clinical neuropsychologist. Mr. Bonifacio completed a clinical interview with Dr. Melvyn Novas, billed as one unit [phone], and 130 minutes of cognitive testing and scoring, billed as one unit [phone] and three additional units [id number]. Psychometrist Cruzita Lederer, B.S., assisted Dr. Melvyn Novas with test administration and scoring procedures. As a separate and discrete service, Dr. Melvyn Novas spent a total of 160 minutes in interpretation and report writing billed as one unit [phone] and two units 96133.

## 2022-10-27 ENCOUNTER — Ambulatory Visit (INDEPENDENT_AMBULATORY_CARE_PROVIDER_SITE_OTHER): Payer: No Typology Code available for payment source | Admitting: Psychology

## 2022-10-27 DIAGNOSIS — F332 Major depressive disorder, recurrent severe without psychotic features: Secondary | ICD-10-CM

## 2022-10-27 DIAGNOSIS — G3184 Mild cognitive impairment, so stated: Secondary | ICD-10-CM

## 2022-10-27 DIAGNOSIS — F4310 Post-traumatic stress disorder, unspecified: Secondary | ICD-10-CM | POA: Diagnosis not present

## 2022-10-27 DIAGNOSIS — F411 Generalized anxiety disorder: Secondary | ICD-10-CM

## 2022-10-27 DIAGNOSIS — F819 Developmental disorder of scholastic skills, unspecified: Secondary | ICD-10-CM | POA: Diagnosis not present

## 2022-10-27 NOTE — Progress Notes (Signed)
   Neuropsychology Feedback Session [name]. [facility]  Reason for Referral:   [name] is a 72 y.o. left-handed African-American male referred by Sharene Butters, PA-C, to characterize his current cognitive functioning and assist with diagnostic clarity and treatment planning in the context of subjective cognitive decline.   Feedback:   [name] completed a comprehensive neuropsychological evaluation on 10/18/2022. Please refer to that encounter for the full report and recommendations. Briefly, results suggested an isolated impairment surrounding phonemic fluency. Further performance variability was exhibited across processing speed and executive functioning. While he did have trouble differentiating between words across a list-based recognition task, performances across all other aspects of memory testing were appropriate. The etiology for ongoing dysfunction is multifactorial in nature. As outlined in old records provided by his wife, concerns were expressed surrounding an undiagnosed learning disability as far back as the mid to late 1990s. Testing later revealed this to be true, describing an unspecified learning disability favoring trouble with reading and reading comprehension. Previous records also suggest concerns surrounding severe depressive experiences dating back to 1979, as well as post-traumatic stress disorder (PTSD) formally diagnosed in 2016 (but likely present for a far longer duration of time). Taken together, the combination of a reading/verbal based learning disability and severe psychiatric distress can certainly explain primary patterns of variability across the current neuropsychological evaluation. Difficulties could additionally be exacerbated by his numerous medical ailments (e.g., hypertension, coronary artery disease, type II diabetes, hypothyroidism, hyperlipidemia), chronic pain, and polypharmacy. While a prior [facility]-based  neurologist diagnosed him with memory loss and concerns for Alzheimer's disease back in 2015, this would appear an erroneous diagnosis.   [name] was accompanied by his wife during the current feedback session. Content of the current session focused on the results of his neuropsychological evaluation. [name] was given the opportunity to ask questions and his questions were answered. He was encouraged to reach out should additional questions arise. A copy of his report was provided at the conclusion of the visit.      Greater than 31 minutes were spent preparing for, conducting, and documenting the current feedback session with [name], billed as one unit [phone].

## 2022-11-04 ENCOUNTER — Telehealth: Payer: Self-pay | Admitting: Pulmonary Disease

## 2022-11-04 DIAGNOSIS — J449 Chronic obstructive pulmonary disease, unspecified: Secondary | ICD-10-CM

## 2022-11-04 MED ORDER — ALBUTEROL SULFATE (2.5 MG/3ML) 0.083% IN NEBU: 2.5000 mg | INHALATION_SOLUTION | RESPIRATORY_TRACT | 2 refills | Status: AC | PRN

## 2022-11-04 MED ORDER — ALBUTEROL SULFATE HFA 108 (90 BASE) MCG/ACT IN AERS: 2.0000 | INHALATION_SPRAY | Freq: Four times a day (QID) | RESPIRATORY_TRACT | 5 refills | Status: AC | PRN

## 2022-11-04 MED ORDER — FLUTICASONE-SALMETEROL 250-50 MCG/ACT IN AEPB: 1.0000 | INHALATION_SPRAY | Freq: Two times a day (BID) | RESPIRATORY_TRACT | 5 refills | Status: DC

## 2022-11-04 NOTE — Telephone Encounter (Signed)
Called and spoke to wife and she states that she is needing refill son husband inhalers. She has follow up with Dr Jenetta Downer in may but he is almost out of his inhalers and will need refills before he is seen. Verified medications and pharmacy. Nothing further needed

## 2022-12-29 ENCOUNTER — Telehealth: Payer: Self-pay | Admitting: Pulmonary Disease

## 2022-12-29 ENCOUNTER — Ambulatory Visit (INDEPENDENT_AMBULATORY_CARE_PROVIDER_SITE_OTHER): Payer: No Typology Code available for payment source | Admitting: Pulmonary Disease

## 2022-12-29 ENCOUNTER — Encounter: Payer: Self-pay | Admitting: Pulmonary Disease

## 2022-12-29 VITALS — BP 138/58 | HR 63 | Ht 73.5 in | Wt 248.2 lb

## 2022-12-29 DIAGNOSIS — J449 Chronic obstructive pulmonary disease, unspecified: Secondary | ICD-10-CM

## 2022-12-29 DIAGNOSIS — G4733 Obstructive sleep apnea (adult) (pediatric): Secondary | ICD-10-CM | POA: Diagnosis not present

## 2022-12-29 DIAGNOSIS — J410 Simple chronic bronchitis: Secondary | ICD-10-CM

## 2022-12-29 MED ORDER — SPIRIVA RESPIMAT 2.5 MCG/ACT IN AERS: 2.0000 | INHALATION_SPRAY | Freq: Every day | RESPIRATORY_TRACT | 5 refills | Status: DC

## 2022-12-29 NOTE — Progress Notes (Signed)
[name]    [id number]    1951/06/08  Primary Care Physician:Dean, Lind Guest, MD  Referring Physician: Gwenyth Bender, MD [address] [place],  [name] [address]  Chief complaint:   Patient with a history of narcolepsy, obstructive sleep apnea Compliant with BiPAP, compliant with stimulant medications In for follow-up visit today Does have some shortness of breath on exertion  HPI:  Shortness of breath on exertion  For his obstructive sleep apnea -Is compliant with BiPAP -Sleeps well -Still wakes up tired sometimes -Long-term use of modafinil for diagnosis of narcolepsy -This continues to help  Regarding his shortness of breath -This is likely multifactorial -Does try to stay active  Daytime fatigue  Has been on Wixela  Does take a regular daytime nap  He does have pleural plaques and rounded atelectasis on CT scan  His health has been generally well  History of PTSD  Exposures: No significant exposures Smoking history: Never smoker   Outpatient Encounter Medications as of 12/29/2022  Medication Sig   albuterol (PROVENTIL) (2.5 MG/3ML) 0.083% nebulizer solution Take 3 mLs (2.5 mg total) by nebulization every 4 (four) hours as needed for wheezing or shortness of breath.   albuterol (VENTOLIN HFA) 108 (90 Base) MCG/ACT inhaler Inhale 2 puffs into the lungs every 6 (six) hours as needed for wheezing or shortness of breath.   amLODipine (NORVASC) 10 MG tablet Take 10 mg by mouth daily.   aspirin EC 81 MG tablet Take 81 mg by mouth daily. Swallow whole.   b complex vitamins capsule Take 1 capsule by mouth daily.   carbamide peroxide (DEBROX) 6.5 % OTIC solution Place 5 drops into both ears as needed (ear wax).   Cholecalciferol (VITAMIN D3) 1000 UNITS CAPS Take 1,000 Units by mouth every morning.   FERGON 240 (27 Fe) MG tablet Take 240 mg by mouth daily.   fexofenadine (ALLEGRA) 180 MG tablet Take 180 mg by mouth daily.   fluticasone  (FLONASE) 50 MCG/ACT nasal spray Place 1 spray into both nostrils in the morning and at bedtime.   fluticasone-salmeterol (WIXELA INHUB) 250-50 MCG/ACT AEPB Inhale 1 puff into the lungs in the morning and at bedtime.   gabapentin (NEURONTIN) 400 MG capsule Take 400 mg by mouth 3 (three) times daily.   hydrALAZINE (APRESOLINE) 50 MG tablet Take 50 mg by mouth 3 (three) times daily.   hydrOXYzine (ATARAX/VISTARIL) 10 MG tablet Take 20 mg by mouth at bedtime.   losartan (COZAAR) 100 MG tablet Take 100 mg by mouth every evening.   melatonin 3 MG TABS tablet Take 3 mg by mouth at bedtime.   metFORMIN (GLUCOPHAGE) 500 MG tablet Take 500 mg by mouth daily with breakfast.   modafinil (PROVIGIL) 100 MG tablet Take 1-2 tablets (100-200 mg total) by mouth See admin instructions. 200 mg in the morning 100 mg  at noon (Patient taking differently: Take 100-200 mg by mouth See admin instructions. Take 200 mg in the morning 100 mg  at noon)   NON FORMULARY BIPAP at bedtime   omeprazole (PRILOSEC OTC) 20 MG tablet Take 1 tablet (20 mg total) by mouth daily.   PARoxetine (PAXIL) 40 MG tablet Take 40 mg by mouth at bedtime.   pravastatin (PRAVACHOL) 40 MG tablet Take 40 mg by mouth at bedtime.   Probiotic Product (PROBIOTIC DAILY PO) Take 1 capsule by mouth daily.   QUEtiapine (SEROQUEL) 25 MG tablet Take 50 mg by mouth  at bedtime.   spironolactone (ALDACTONE) 25 MG tablet Take 50 mg by mouth daily.    tamsulosin (FLOMAX) 0.4 MG CAPS capsule Take 0.4 mg by mouth every morning.   triamcinolone ointment (KENALOG) 0.1 % Apply 1 application  topically daily as needed (rash).   Turmeric 400 MG CAPS Take 400 mg by mouth daily.   vitamin E 400 UNIT capsule Take 400 Units by mouth daily.   No facility-administered encounter medications on file as of 12/29/2022.    Allergies as of 12/29/2022 - Review Complete 12/29/2022  Allergen Reaction Noted   Misc. sulfonamide containing compounds Other (See Comments) 07/23/2022    Ace inhibitors Swelling 08/27/2011   Sulfa antibiotics Swelling 08/27/2011    Past Medical History:  Diagnosis Date   Abnormal CT scan of lung 07/23/2021   Abnormality of gait    Allergic rhinitis due to pollen 11/25/2010   Bilateral primary osteoarthritis of hip 12/12/2019   Carpal tunnel syndrome on right    Contact with and (suspected) exposure to asbestos 07/23/2021   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   Hyperlipidemia    Hypothyroidism    Klippel-Feil sequence    Learning disability, unspecified 1997   Reading/reading comprehension   Left inguinal hernia    Low back pain 12/12/2019   Lumbar spondylosis    Major depressive disorder    Mild cognitive impairment 10/18/2022   Nonspecific abnormal electrocardiogram (ECG) (EKG)    Obstructive sleep apnea 12/15/2015   Osteoarthritis    Osteoporosis    Pain in joints of right hand    Peripheral neuropathy    Peripheral venous insufficiency    Pituitary abnormality    pituitary edema; takes bromocriptine   PTSD (post-traumatic stress disorder) 11/25/2010   Spinal stenosis of lumbar region 09/14/2018   Type 2 diabetes mellitus without complications 11/25/2010   Vitamin D deficiency    Weakness of left leg    Wears glasses     Past Surgical History:  Procedure Laterality Date   CARDIAC CATHETERIZATION     CARPAL TUNNEL RELEASE Right 10/12/2022   Procedure: Right CARPAL TUNNEL RELEASE;  Surgeon: Coletta Memos, MD;  Location: [facility] OR;  Service: Neurosurgery;  Laterality: Right;  RM  20   CERVICAL DISC SURGERY     x 2   COLONOSCOPY     HIP ARTHROPLASTY     right   INGUINAL HERNIA REPAIR Left 09/06/2019   Procedure: LAPAROSCOPIC LEFT INGUINAL HERNIA WITH MESH;  Surgeon: Gaynelle Adu, MD;  Location[facility];  Service: General;  Laterality: Left;   JOINT REPLACEMENT     KNEE ARTHROPLASTY     bilateral   LUMBAR LAMINECTOMY/DECOMPRESSION MICRODISCECTOMY  08/31/2011   Procedure: LUMBAR LAMINECTOMY/DECOMPRESSION MICRODISCECTOMY;  Surgeon: Clydene Fake, MD;  Location: [facility]UR[facility] OR[facility];  Service: Neurosurgery;  Laterality: N/A;  LumbarThree to Lumbar Five Laminectomy, possible Lumbar Four-Five Diskectomy   SHOULDER SURGERY     left   UPPER GI ENDOSCOPY      Family History  Problem Relation Age of Onset  Diabetes Mother    Alzheimer's disease Mother    Depression Father    ALS Father    ALS Brother    Hypertension Brother    Hypertension Brother    Cancer Brother     Social History   Socioeconomic History   Marital status: Married    Spouse name: Not on file   Number of children: 2   Years of education: 14   Highest education level: Associate degree: occupational, Scientist, product/process development, or vocational program  Occupational History   Occupation: Retired    Comment: [employer]  Tobacco Use   Smoking status: Never   Smokeless tobacco: Never  Building services engineer Use: Never used  Substance and Sexual Activity   Alcohol use: No   Drug use: No   Sexual activity: Not Currently  Other Topics Concern   Not on file  Social History Narrative   Left handed   Drinks caffeine, soda   Lives with wife   retired   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  HENT: Negative.    Eyes: Negative.   Respiratory:  Positive for apnea. Negative for cough, choking and shortness of breath.   Cardiovascular: Negative.   Endocrine: Negative.   Genitourinary: Negative.   Allergic/Immunologic: Negative.   Neurological: Negative.   Hematological: Negative.   Psychiatric/Behavioral:  Positive for sleep disturbance.     Vitals:   12/29/22 0902 12/29/22 0906  BP: (!) 142/60 (!) 138/58  Pulse: 63   SpO2: 98%     Physical Exam Constitutional:      General: He is not in acute distress.    Appearance: He is well-developed. He is not diaphoretic.  HENT:     Head: Normocephalic and atraumatic.     Mouth/Throat:     Mouth: Mucous membranes are moist.  Eyes:     General: No scleral icterus.    Pupils: Pupils are equal, round, and reactive to light.  Neck:     Thyroid: No thyromegaly.     Trachea: No tracheal deviation.  Cardiovascular:     Rate and Rhythm: Normal rate and regular rhythm.  Pulmonary:     Effort: Pulmonary effort is normal. No respiratory distress.     Breath sounds: Normal breath sounds. No stridor. No wheezing or rhonchi.  Musculoskeletal:        General: No deformity. Normal range of motion.     Cervical back: No rigidity or tenderness.  Skin:    General: Skin is warm and dry.     Findings: No erythema.  Neurological:     Mental Status: He is alert.     Coordination: Coordination normal.  Psychiatric:        Behavior: Behavior normal.    Data Reviewed: Sleep study from November 2007 noted Compliance data shows 98% compliance BiPAP V auto 18/14 residual AHI of 6.1  CT scan of the chest with pleural plaques and rounded atelectasis from 08/14/2021-reviewed  Most recent chest x-ray 12/11/2021-reviewed by myself with no acute infiltrative process  I do not see a PFT in the system -Stated he had had one done in [place]  Assessment:  1.  Obstructive sleep apnea-adequately treated -Continue  BiPAP  Shortness of breath on exertion -This is multifactorial -Currently on Wixela -Discussed addition of Spiriva, prescription provided -Uses rescue inhaler as needed  For deconditioning -Will obtain pulmonary function test -Referral for pulmonary rehab  Narcolepsy -Continue modafinil  PTSD history  Rounded atelectasis  Plan/Recommendations:  Continue graded activities as tolerated  Referral to pulmonary rehab  Schedule for pulmonary function test  Add Spiriva Respimat  Continue bronchodilators  Still not able to find a PFT on record on him  Schedule for CT scan of the chest at next visit to follow-up on rounded atelectasis at [place]   Virl Diamond MD [facility] 12/29/2022, 9:12 AM  CC: Gwenyth Bender, MD

## 2022-12-29 NOTE — Patient Instructions (Addendum)
Schedule for PFT  Add Spiriva to [facility] -This is because of the ongoing shortness of breath -Continue use of your rescue inhaler as needed  For your sleep apnea -Your BiPAP seems to be working well  Referral for pulmonary rehab  I will see you in 3 months

## 2022-12-29 NOTE — Telephone Encounter (Signed)
Spoke with patients wife advised script for Spiriva to [facility]. Wife states she will call and verify then give [place] a call back.

## 2022-12-29 NOTE — Telephone Encounter (Signed)
Pt meds were sent to incorrect pharmacy. Pls send Spiriva to [facility]

## 2022-12-29 NOTE — Addendum Note (Signed)
Addended by: Lanna Poche on: 12/29/2022 09:48 AM   Modules accepted: Orders

## 2023-01-05 ENCOUNTER — Encounter (HOSPITAL_BASED_OUTPATIENT_CLINIC_OR_DEPARTMENT_OTHER): Payer: Self-pay | Admitting: Emergency Medicine

## 2023-01-05 ENCOUNTER — Emergency Department (HOSPITAL_BASED_OUTPATIENT_CLINIC_OR_DEPARTMENT_OTHER): Payer: No Typology Code available for payment source

## 2023-01-05 ENCOUNTER — Emergency Department (HOSPITAL_BASED_OUTPATIENT_CLINIC_OR_DEPARTMENT_OTHER)
Admission: EM | Admit: 2023-01-05 | Discharge: 2023-01-05 | Disposition: A | Discharge status: Home / Self Care | Payer: No Typology Code available for payment source | Attending: Emergency Medicine | Admitting: Emergency Medicine

## 2023-01-05 ENCOUNTER — Other Ambulatory Visit: Payer: Self-pay

## 2023-01-05 DIAGNOSIS — Z7982 Long term (current) use of aspirin: Secondary | ICD-10-CM | POA: Diagnosis not present

## 2023-01-05 DIAGNOSIS — M79605 Pain in left leg: Secondary | ICD-10-CM

## 2023-01-05 DIAGNOSIS — Z7984 Long term (current) use of oral hypoglycemic drugs: Secondary | ICD-10-CM | POA: Insufficient documentation

## 2023-01-05 DIAGNOSIS — J449 Chronic obstructive pulmonary disease, unspecified: Secondary | ICD-10-CM | POA: Insufficient documentation

## 2023-01-05 DIAGNOSIS — M7989 Other specified soft tissue disorders: Secondary | ICD-10-CM | POA: Diagnosis not present

## 2023-01-05 DIAGNOSIS — Z79899 Other long term (current) drug therapy: Secondary | ICD-10-CM | POA: Insufficient documentation

## 2023-01-05 LAB — BASIC METABOLIC PANEL
Anion gap: 9 (ref 5–15)
BUN: 26 mg/dL — ABNORMAL HIGH (ref 8–23)
CO2: 27 mmol/L (ref 22–32)
Calcium: 9.7 mg/dL (ref 8.9–10.3)
Chloride: 104 mmol/L (ref 98–111)
Creatinine, Ser: 1.67 mg/dL — ABNORMAL HIGH (ref 0.61–1.24)
GFR, Estimated: 43 mL/min — ABNORMAL LOW (ref >60)
Glucose, Bld: 105 mg/dL — ABNORMAL HIGH (ref 70–99)
Potassium: 4.2 mmol/L (ref 3.5–5.1)
Sodium: 140 mmol/L (ref 135–145)

## 2023-01-05 LAB — CBC
HCT: 41.2 % (ref 39.0–52.0)
Hemoglobin: 13.2 g/dL (ref 13.0–17.0)
MCH: 24 pg — ABNORMAL LOW (ref 26.0–34.0)
MCHC: 32 g/dL (ref 30.0–36.0)
MCV: 74.9 fL — ABNORMAL LOW (ref 80.0–100.0)
Platelets: 277 10*3/uL (ref 150–400)
RBC: 5.5 MIL/uL (ref 4.22–5.81)
RDW: 14.6 % (ref 11.5–15.5)
WBC: 6.6 10*3/uL (ref 4.0–10.5)
nRBC: 0 % (ref 0.0–0.2)

## 2023-01-05 NOTE — Discharge Instructions (Addendum)
You were seen for your leg pain in the emergency department. Your ultrasound did not show a blood clot.   [name], please use tylenol and warm compresses for your leg.    Check your MyChart online for the results of any tests that had not resulted by the time you left the emergency department.   Follow-up with your primary doctor in 2-3 days regarding your visit.    Return immediately to the emergency department if you experience any of the following: chest pain, shortness of breath, or any other concerning symptoms.    Thank you for visiting our Emergency Department. It was a pleasure taking care of you today.

## 2023-01-05 NOTE — ED Triage Notes (Signed)
Pt via pv from home with left leg pain this morning; pt states he believes he may have a blood clot. No hx of the same; no thinners. Pt's left shin is warm and is painful when touched. Denies any sob. Pt alert & Oriented, nad noted.

## 2023-01-05 NOTE — ED Notes (Signed)
Patient transported to US 

## 2023-01-05 NOTE — ED Provider Notes (Signed)
[facility] AT [facility] Provider Note   CSN: [id number] Arrival date & time: 01/05/23  1126     History {Add pertinent medical, surgical, social history, OB history to HPI:1} Chief Complaint  Patient presents with   Leg Pain    [name] is a 72 y.o. male.  This morning veings and swelling to fron tof legs No ac Pain on front No pe sxs       Home Medications Prior to Admission medications   Medication Sig Start Date End Date Taking? Authorizing Provider  albuterol (PROVENTIL) (2.5 MG/3ML) 0.083% nebulizer solution Take 3 mLs (2.5 mg total) by nebulization every 4 (four) hours as needed for wheezing or shortness of breath. 11/04/22  Yes Olalere, Adewale A, MD  albuterol (VENTOLIN HFA) 108 (90 Base) MCG/ACT inhaler Inhale 2 puffs into the lungs every 6 (six) hours as needed for wheezing or shortness of breath. 11/04/22  Yes Olalere, Adewale A, MD  amLODipine (NORVASC) 10 MG tablet Take 10 mg by mouth daily. 01/09/19  Yes [provider]  aspirin EC 81 MG tablet Take 81 mg by mouth daily. Swallow whole.   Yes [provider]  b complex vitamins capsule Take 1 capsule by mouth daily.   Yes [provider]  carbamide peroxide (DEBROX) 6.5 % OTIC solution Place 5 drops into both ears as needed (ear wax).   Yes [provider]  Cholecalciferol (VITAMIN D3) 1000 UNITS CAPS Take 1,000 Units by mouth every morning.   Yes [provider]  FERGON 240 (27 Fe) MG tablet Take 240 mg by mouth daily. 08/25/21  Yes [provider]  fexofenadine (ALLEGRA) 180 MG tablet Take 180 mg by mouth daily.   Yes [provider]  fluticasone (FLONASE) 50 MCG/ACT nasal spray Place 1 spray into both nostrils in the morning and at bedtime.   Yes [provider]  fluticasone-salmeterol (WIXELA INHUB) 250-50 MCG/ACT AEPB Inhale 1 puff into the lungs in the morning and at bedtime. 11/04/22  Yes Olalere,  Adewale A, MD  gabapentin (NEURONTIN) 400 MG capsule Take 400 mg by mouth 3 (three) times daily.   Yes [provider]  hydrALAZINE (APRESOLINE) 50 MG tablet Take 50 mg by mouth 3 (three) times daily.   Yes [provider]  hydrOXYzine (ATARAX/VISTARIL) 10 MG tablet Take 20 mg by mouth at bedtime. 06/11/21  Yes [provider]  losartan (COZAAR) 100 MG tablet Take 100 mg by mouth every evening.   Yes [provider]  melatonin 3 MG TABS tablet Take 3 mg by mouth at bedtime.   Yes [provider]  metFORMIN (GLUCOPHAGE) 500 MG tablet Take 500 mg by mouth daily with breakfast.   Yes [provider]  modafinil (PROVIGIL) 100 MG tablet Take 1-2 tablets (100-200 mg total) by mouth See admin instructions. 200 mg in the morning 100 mg  at noon Patient taking differently: Take 100-200 mg by mouth See admin instructions. Take 200 mg in the morning 100 mg  at noon 08/23/22  Yes Olalere, Adewale A, MD  NON FORMULARY BIPAP at bedtime   Yes [provider]  omeprazole (PRILOSEC OTC) 20 MG tablet Take 1 tablet (20 mg total) by mouth daily. 01/30/20  Yes Meredith Pel, NP  PARoxetine (PAXIL) 40 MG tablet Take 40 mg by mouth at bedtime.   Yes [provider]  pravastatin (PRAVACHOL) 40 MG tablet Take 40 mg by mouth at bedtime.   Yes [provider]  Probiotic Product (PROBIOTIC DAILY PO) Take 1 capsule by mouth daily.   Yes [provider]  QUEtiapine (SEROQUEL) 25 MG tablet Take 50 mg by mouth at bedtime.   Yes [provider]  spironolactone (ALDACTONE) 25 MG tablet Take 50 mg by mouth daily.    Yes [provider]  tamsulosin (FLOMAX) 0.4 MG CAPS capsule Take 0.4 mg by mouth every morning.   Yes [provider]  Tiotropium Bromide Monohydrate (SPIRIVA RESPIMAT) 2.5 MCG/ACT AERS Inhale 2 puffs into the lungs daily. 12/29/22  Yes Olalere, Adewale A, MD  Turmeric 400 MG CAPS Take 400 mg by mouth  daily.   Yes [provider]  vitamin E 400 UNIT capsule Take 400 Units by mouth daily.   Yes [provider]  triamcinolone ointment (KENALOG) 0.1 % Apply 1 application  topically daily as needed (rash).    [provider]      Allergies    Misc. sulfonamide containing compounds, Ace inhibitors, and Sulfa antibiotics    Review of Systems   Review of Systems  Physical Exam Updated Vital Signs BP (!) 149/69 (BP Location: Right Arm)   Pulse 61   Temp 98.2 F (36.8 C) (Oral)   Resp 18   Ht 6' 1.5" (1.867 m)   Wt 109.8 kg   SpO2 98%   BMI 31.50 kg/m  Physical Exam  ED Results / Procedures / Treatments   Labs (all labs ordered are listed, but only abnormal results are displayed) Labs Reviewed  BASIC METABOLIC PANEL - Abnormal; Notable for the following components:      Result Value   Glucose, Bld 105 (*)    BUN 26 (*)    Creatinine, Ser 1.67 (*)    GFR, Estimated 43 (*)    All other components within normal limits  CBC - Abnormal; Notable for the following components:   MCV 74.9 (*)    MCH 24.0 (*)    All other components within normal limits    EKG None  Radiology US Venous Img Lower Unilateral Left  Result Date: 01/05/2023 CLINICAL DATA:  Acute left lower extremity pain. EXAM: Left LOWER EXTREMITY VENOUS DOPPLER ULTRASOUND TECHNIQUE: Gray-scale sonography with compression, as well as color and duplex ultrasound, were performed to evaluate the deep venous system(s) from the level of the common femoral vein through the popliteal and proximal calf veins. COMPARISON:  None Available. FINDINGS: VENOUS Normal compressibility of the common femoral, superficial femoral, and popliteal veins, as well as the visualized calf veins. Visualized portions of profunda femoral vein and great saphenous vein unremarkable. No filling defects to suggest DVT on grayscale or color Doppler imaging. Doppler waveforms show normal direction of venous flow, normal  respiratory plasticity and response to augmentation. Limited views of the contralateral common femoral vein are unremarkable. OTHER None. Limitations: none IMPRESSION: Negative. Electronically Signed   By: Lupita Raider M.D.   On: 01/05/2023 13:06    Procedures Procedures  {Document cardiac monitor, telemetry assessment procedure when appropriate:1}  Medications Ordered in ED Medications - No data to display  ED Course/ Medical Decision Making/ A&P   {   Click here for ABCD2, HEART and other calculatorsREFRESH Note before signing :1}                          Medical Decision Making Amount and/or Complexity of Data Reviewed Labs: ordered.   ***  {Document critical care time when  appropriate:1} {Document review of labs and clinical decision tools ie heart score, Chads2Vasc2 etc:1}  {Document your independent review of radiology images, and any outside records:1} {Document your discussion with family members, caretakers, and with consultants:1} {Document social determinants of health affecting pt's care:1} {Document your decision making why or why not admission, treatments were needed:1} Final Clinical Impression(s) / ED Diagnoses Final diagnoses:  None    Rx / DC Orders ED Discharge Orders     None

## 2023-01-12 ENCOUNTER — Encounter (HOSPITAL_COMMUNITY): Payer: Self-pay

## 2023-01-12 ENCOUNTER — Telehealth (HOSPITAL_COMMUNITY): Payer: Self-pay

## 2023-01-12 NOTE — Telephone Encounter (Signed)
Pt returned [facility] phone call and stated he is interested in PR. Will pass to RN for review.

## 2023-01-12 NOTE — Telephone Encounter (Signed)
Attempted to call patient in regards to Pulmonary Rehab - LM on VM Mailed letter 

## 2023-01-12 NOTE — Telephone Encounter (Signed)
Outside/paper referral received by Dr. Tristan Schroeder from the [place]. Will fax over Physician order and request further documents. Insurance benefits and eligibility to be determined.

## 2023-01-12 NOTE — Telephone Encounter (Signed)
[place], [place] [id number].

## 2023-01-17 ENCOUNTER — Telehealth: Payer: Self-pay | Admitting: Pulmonary Disease

## 2023-01-17 NOTE — Telephone Encounter (Signed)
[name] states patient needs refill for Modafinil and Stiolto Respimat. Pharmacy is [facility]. [name] phone number is [phone].

## 2023-01-19 ENCOUNTER — Encounter (HOSPITAL_COMMUNITY): Payer: Self-pay

## 2023-01-19 MED ORDER — MODAFINIL 100 MG PO TABS: ORAL_TABLET | ORAL | 2 refills | Status: DC

## 2023-01-19 NOTE — Telephone Encounter (Signed)
Will send request to Dr. Wynona Neat to sign for Modafinil rx.  Patient is not currently prescribed Stiolto.  Left message on patients voice mail to verify correct medication. (This may be Spiriva).  Have not sent in any refills yet.  Will wait for patient to call clinic to send in refills.

## 2023-01-19 NOTE — Telephone Encounter (Signed)
Patient called back.  Read message to patient and she said she needed the refill for the modafinil (PROVIGIL) 100 MG tablet  only.  Please send to [place], [place].  Any other questions, please call at [phone]

## 2023-01-19 NOTE — Telephone Encounter (Signed)
Dr. Wynona Neat, please advise if you are ok with refilling his modafinil. Thanks!

## 2023-01-19 NOTE — Telephone Encounter (Signed)
Modafinil refilled.

## 2023-01-20 NOTE — Telephone Encounter (Signed)
ATC X1 LVM for patient to call the office back. Please advise Modafinil has been refilled

## 2023-01-20 NOTE — Telephone Encounter (Signed)
Spoke with patient. Advised refill of modafinl has been sent to pharmacy. NFN

## 2023-01-20 NOTE — Telephone Encounter (Signed)
Patient is returning a call. Please call to discuss further at [phone]

## 2023-01-24 ENCOUNTER — Encounter (HOSPITAL_COMMUNITY): Payer: Self-pay

## 2023-01-24 ENCOUNTER — Encounter (HOSPITAL_COMMUNITY)
Admission: RE | Admit: 2023-01-24 | Discharge: 2023-01-24 | Disposition: A | Discharge status: Home / Self Care | Payer: No Typology Code available for payment source | Source: Ambulatory Visit | Attending: Pulmonary Disease | Admitting: Pulmonary Disease

## 2023-01-24 VITALS — BP 164/50 | HR 70 | Wt 250.4 lb

## 2023-01-24 DIAGNOSIS — J449 Chronic obstructive pulmonary disease, unspecified: Secondary | ICD-10-CM | POA: Insufficient documentation

## 2023-01-24 NOTE — Progress Notes (Signed)
[name] 72 y.o. male Pulmonary Rehab Orientation Note This patient who was referred to Pulmonary Rehab by Dr. 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 He arrived ambulatory with slight limp gait. He does not carry portable oxygen.  Per patient, [name] uses oxygen never. Color good, skin warm and dry. Patient is oriented to time and place. Patient's medical history, psychosocial health, and medications reviewed. Psychosocial assessment reveals patient lives with spouse. [name] is currently retired. Patient hobbies include  [name] work, and wood working . Patient reports his stress level is high. Areas of stress/anxiety include family . Patient does exhibit signs of depression. Signs of depression include anxiety, hopelessness, and sadness and difficulty maintaining sleep and fatigue. PHQ2/9 score 2/14. [name] shows good  coping skills with positive outlook on life. Offered emotional support and reassurance. Will continue to monitor. Physical assessment performed by Nurse pick: Tim Sine RN. Please see their orientation physical assessment note. [name] reports he  does take medications as prescribed. Patient states he  follows a regular  diet. The patient has been trying to lose weight through a healthy diet and exercise program.. Patient's weight will be monitored closely. Demonstration and practice of PLB using pulse oximeter. [name] able to return demonstration satisfactorily. Safety and hand hygiene in the exercise area reviewed with patient. [name] voices understanding of the information reviewed. Department expectations discussed with patient and achievable goals were set. The patient shows enthusiasm about attending the program and we look forward to working with [name]. [name] completed a 6 min walk test today and is scheduled to begin exercise on 6/18@ 10:15.   [id number] [name], [name]

## 2023-01-24 NOTE — Progress Notes (Signed)
Pulmonary Rehab Orientation Physical Assessment Note  Physical assessment reveals  Patient is alert and oriented.  Heart rate is normal, breath sounds assessed by Durel Salts RRT, who reports clear to auscultation, no wheezes, rales, or rhonchi.  Pt has an appetite and shared he wants to lose weight. No reports of nausea, vomiting or diarrhea. Strong bilateral radial pulses. No swelling to upper or lower extremities Patient shared he does have occasional swelling to lower left extremity.

## 2023-01-24 NOTE — Progress Notes (Signed)
[name] 72 y.o. male  Initial Psychosocial Assessment  Pt psychosocial assessment reveals pt lives with their spouse. Pt is currently retired. Pt hobbies include  leather work and wood working . Pt reports his  stress level is high. Areas of stress/anxiety include family .  Pt does exhibit signs of depression. Signs of depression include anxiety, hopelessness, and sadness and difficulty maintaining sleep and fatigue. Pt shows good  coping skills with positive outlook . Offered emotional support and reassurance. Monitor and evaluate progress toward psychosocial goal(s).  Goal(s): Improved management of stress, anxiety, and depression Improved coping skills Help patient work toward returning to meaningful activities that improve patient's QOL and are attainable with patient's lung disease   01/24/2023 12:09 PM

## 2023-01-24 NOTE — Progress Notes (Signed)
Pulmonary Individual Treatment Plan  Patient Details  Name: [name] MRN: [id number] Date of Birth: 1950-09-19 Referring Provider:   [name] Pulmonary Rehab Walk Test from 01/24/2023 in [facility]  Referring Provider [name]       Initial Encounter Date:  Flowsheet Row Pulmonary Rehab Walk Test from 01/24/2023 in [facility]  Date 01/24/23       Visit Diagnosis: Stage 2 moderate COPD by GOLD classification (HCC)  Patient's Home Medications on Admission:   Current Outpatient Medications:    albuterol (PROVENTIL) (2.5 MG/3ML) 0.083% nebulizer solution, Take 3 mLs (2.5 mg total) by nebulization every 4 (four) hours as needed for wheezing or shortness of breath., Disp: 1080 mL, Rfl: 2   albuterol (VENTOLIN HFA) 108 (90 Base) MCG/ACT inhaler, Inhale 2 puffs into the lungs every 6 (six) hours as needed for wheezing or shortness of breath., Disp: 8 g, Rfl: 5   amLODipine (NORVASC) 10 MG tablet, Take 10 mg by mouth daily., Disp: , Rfl:    aspirin EC 81 MG tablet, Take 81 mg by mouth daily. Swallow whole., Disp: , Rfl:    b complex vitamins capsule, Take 1 capsule by mouth daily., Disp: , Rfl:    buPROPion (WELLBUTRIN SR) 150 MG 12 hr tablet, Take 150 mg by mouth daily., Disp: , Rfl:    carbamide peroxide (DEBROX) 6.5 % OTIC solution, Place 5 drops into both ears as needed (ear wax)., Disp: , Rfl:    Cholecalciferol (VITAMIN D3) 1000 UNITS CAPS, Take 1,000 Units by mouth every morning., Disp: , Rfl:    empagliflozin (JARDIANCE) 25 MG TABS tablet, Take 25 mg by mouth daily. Take 1/2 tablet every morning, Disp: , Rfl:    FERGON 240 (27 Fe) MG tablet, Take 240 mg by mouth daily., Disp: , Rfl:    fexofenadine (ALLEGRA) 180 MG tablet, Take 180 mg by mouth daily., Disp: , Rfl:    fluticasone (FLONASE) 50 MCG/ACT nasal spray, Place 1 spray into both nostrils in the  morning and at bedtime., Disp: , Rfl:    fluticasone-salmeterol (WIXELA INHUB) 250-50 MCG/ACT AEPB, Inhale 1 puff into the lungs in the morning and at bedtime., Disp: 1 each, Rfl: 5   gabapentin (NEURONTIN) 400 MG capsule, Take 400 mg by mouth 3 (three) times daily., Disp: , Rfl:    hydrALAZINE (APRESOLINE) 50 MG tablet, Take 50 mg by mouth 3 (three) times daily., Disp: , Rfl:    hydrOXYzine (ATARAX/VISTARIL) 10 MG tablet, Take 20 mg by mouth at bedtime., Disp: , Rfl:    losartan (COZAAR) 100 MG tablet, Take 100 mg by mouth every evening., Disp: , Rfl:    melatonin 3 MG TABS tablet, Take 3 mg by mouth at bedtime., Disp: , Rfl:    metFORMIN (GLUCOPHAGE) 500 MG tablet, Take 500 mg by mouth daily with breakfast., Disp: , Rfl:    modafinil (PROVIGIL) 100 MG tablet, Take 200 mg (2 tablets) in the morning and 100 mg (one tablet) at noon as directed, Disp: 90 tablet, Rfl: 2   NON FORMULARY, BIPAP at bedtime, Disp: , Rfl:    omeprazole (PRILOSEC OTC) 20 MG tablet, Take 1 tablet (20 mg total) by mouth daily., Disp: 30 tablet, Rfl: 2   PARoxetine (PAXIL) 40 MG tablet, Take 40 mg by mouth at bedtime., Disp: , Rfl:    pravastatin (PRAVACHOL) 40 MG tablet, Take 40 mg by  mouth at bedtime., Disp: , Rfl:    Probiotic Product (PROBIOTIC DAILY PO), Take 1 capsule by mouth daily., Disp: , Rfl:    QUEtiapine (SEROQUEL) 25 MG tablet, Take 50 mg by mouth at bedtime., Disp: , Rfl:    spironolactone (ALDACTONE) 25 MG tablet, Take 50 mg by mouth daily. , Disp: , Rfl:    tamsulosin (FLOMAX) 0.4 MG CAPS capsule, Take 0.4 mg by mouth every morning., Disp: , Rfl:    Tiotropium Bromide Monohydrate (SPIRIVA RESPIMAT) 2.5 MCG/ACT AERS, Inhale 2 puffs into the lungs daily., Disp: 1 g, Rfl: 5   triamcinolone ointment (KENALOG) 0.1 %, Apply 1 application  topically daily as needed (rash)., Disp: , Rfl:    Turmeric 400 MG CAPS, Take 400 mg by mouth daily., Disp: , Rfl:    vitamin E 400 UNIT capsule, Take 400 Units by mouth daily.,  Disp: , Rfl:   Past Medical History: Past Medical History:  Diagnosis Date   Abnormal CT scan of lung 07/23/2021   Abnormality of gait    Allergic rhinitis due to pollen 11/25/2010   Bilateral primary osteoarthritis of hip 12/12/2019   Carpal tunnel syndrome on right    Contact with and (suspected) exposure to asbestos 07/23/2021   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   Hyperlipidemia    Hypothyroidism    Klippel-Feil sequence    Learning disability, unspecified 1997   Reading/reading comprehension   Left inguinal hernia    Low back pain 12/12/2019   Lumbar spondylosis    Major depressive disorder    Mild cognitive impairment 10/18/2022   Nonspecific abnormal electrocardiogram (ECG) (EKG)    Obstructive sleep apnea 12/15/2015   Osteoarthritis    Osteoporosis    Pain in joints of right hand    Peripheral neuropathy    Peripheral venous insufficiency    Pituitary abnormality    pituitary edema; takes bromocriptine   PTSD (post-traumatic stress disorder) 11/25/2010   Spinal stenosis of lumbar region 09/14/2018   Type 2 diabetes mellitus without complications 11/25/2010   Vitamin D deficiency    Weakness of left leg    Wears glasses     Tobacco Use: Social History   Tobacco Use  Smoking Status Never  Smokeless Tobacco Never    Labs: Review Flowsheet       Latest Ref Rng & Units 08/06/2008 07/18/2010 09/05/2012  Labs for ITP Cardiac and Pulmonary Rehab  Hemoglobin A1c 4.0 - 6.0 % 6.7 (NOTE)   The ADA recommends the  following therapeutic goal for glycemic   control related to Hgb A1C measurement:   Goal of Therapy:   < 7.0% Hgb A1C   Reference: American Diabetes Association: Clinical Practice   Recommendations 2008, Diabetes Care,  2008, 31:(Suppl 1).  - 6.3   TCO2 0 - 100 mmol/L - 30  -    Capillary Blood Glucose: Lab Results  Component Value Date   GLUCAP 120 (H) 10/12/2022   GLUCAP 114 (H) 10/12/2022   GLUCAP 108 (H) 10/12/2022   GLUCAP 134 (H) 09/06/2019  GLUCAP 105 (H) 09/06/2019    POCT Glucose     Row Name 01/24/23 1156             POCT Blood Glucose   Pre-Exercise 157 mg/dL                Pulmonary Assessment Scores:  Pulmonary Assessment Scores     Row Name 01/24/23 1153         ADL UCSD   ADL Phase Entry     SOB Score total 56       CAT Score   CAT Score 22       mMRC Score   mMRC Score 3             UCSD: Self-administered rating of dyspnea associated with activities of daily living (ADLs) 6-point scale (0 = "not at all" to 5 = "maximal or unable to do because of breathlessness")  Scoring Scores range from 0 to 120.  Minimally important difference is 5 units  CAT: CAT can identify the health impairment of COPD patients and is better correlated with disease progression.  CAT has a scoring range of zero to 40. The CAT score is classified into four groups of low (less than 10), medium (10 - 20), high (21-30) and very high (31-40) based on the impact level of disease on health status. A CAT score over 10 suggests significant symptoms.  A worsening CAT score could be explained by an exacerbation, poor medication adherence, poor inhaler technique, or progression of COPD or comorbid conditions.  CAT MCID is 2 points  mMRC: mMRC (Modified Medical Research Council) Dyspnea Scale is used to assess the degree of baseline functional disability in patients of respiratory disease due to dyspnea. No minimal important difference is established. A decrease in score of  1 point or greater is considered a positive change.   Pulmonary Function Assessment:  Pulmonary Function Assessment - 01/24/23 1213       Breath   Bilateral Breath Sounds Clear    Shortness of Breath Yes             Exercise Target Goals: Exercise Program Goal: Individual exercise prescription set using results from initial 6 min walk test and THRR while considering  patient's activity barriers and safety.   Exercise Prescription Goal: Initial exercise prescription builds to 30-45 minutes a day of aerobic activity, 2-3 days per week.  Home exercise guidelines will be given to patient during program as part of exercise prescription that the participant will acknowledge.  Activity Barriers & Risk Stratification:  Activity Barriers & Cardiac Risk Stratification - 01/24/23 1135       Activity Barriers & Cardiac Risk Stratification   Activity Barriers Muscular Weakness;Arthritis;Deconditioning;Shortness of Breath;Left Knee Replacement;Right Knee Replacement;Right Hip Replacement;Back Problems;Neck/Spine Problems;Balance Concerns    Cardiac Risk Stratification Moderate             6 Minute Walk:   Oxygen Initial Assessment:  Oxygen Initial Assessment - 01/24/23 1137       Home Oxygen   Home Oxygen Device None    Sleep Oxygen Prescription BiPAP    Home Exercise Oxygen Prescription None    Home Resting Oxygen Prescription None      Initial 6 min Walk   Oxygen Used None      Program Oxygen Prescription   Program Oxygen Prescription None      Intervention   Short Term Goals To learn and exhibit compliance with exercise, home and travel O2  prescription;To learn and understand importance of maintaining oxygen saturations>88%;To learn and demonstrate proper use of respiratory medications;To learn and understand importance of monitoring SPO2 with pulse oximeter and demonstrate accurate use of the pulse oximeter.;To learn and demonstrate proper pursed lip breathing  techniques or other breathing techniques.     Long  Term Goals Exhibits compliance with exercise, home  and travel O2 prescription;Maintenance of O2 saturations>88%;Compliance with respiratory medication;Verbalizes importance of monitoring SPO2 with pulse oximeter and return demonstration;Exhibits proper breathing techniques, such as pursed lip breathing or other method taught during program session;Demonstrates proper use of MDI's             Oxygen Re-Evaluation:   Oxygen Discharge (Final Oxygen Re-Evaluation):   Initial Exercise Prescription:  Initial Exercise Prescription - 01/24/23 1200       Date of Initial Exercise RX and Referring Provider   Date 01/24/23    Referring Provider [name]    Expected Discharge Date 04/21/23      Recumbant Bike   Level 1    RPM 60    Minutes 15    METs 2      Recumbant Elliptical   Level 1    RPM 60    Minutes 15    METs 2      Prescription Details   Frequency (times per week) 2    Duration Progress to 30 minutes of continuous aerobic without signs/symptoms of physical distress      Intensity   THRR 40-80% of Max Heartrate 57-114    Ratings of Perceived Exertion 11-13    Perceived Dyspnea 0-4      Progression   Progression Continue progressive overload as per policy without signs/symptoms or physical distress.      Resistance Training   Training Prescription Yes    Weight blue bands    Reps 10-15             Perform Capillary Blood Glucose checks as needed.  Exercise Prescription Changes:   Exercise Comments:   Exercise Goals and Review:   Exercise Goals     Row Name 01/24/23 1137             Exercise Goals   Increase Physical Activity Yes       Intervention Provide advice, education, support and counseling about physical activity/exercise needs.;Develop an individualized exercise prescription for aerobic and resistive training based on initial evaluation findings, risk stratification, comorbidities  and participant's personal goals.       Expected Outcomes Short Term: Attend rehab on a regular basis to increase amount of physical activity.;Long Term: Exercising regularly at least 3-5 days a week.;Long Term: Add in home exercise to make exercise part of routine and to increase amount of physical activity.       Increase Strength and Stamina Yes       Intervention Provide advice, education, support and counseling about physical activity/exercise needs.;Develop an individualized exercise prescription for aerobic and resistive training based on initial evaluation findings, risk stratification, comorbidities and participant's personal goals.       Expected Outcomes Short Term: Increase workloads from initial exercise prescription for resistance, speed, and METs.;Short Term: Perform resistance training exercises routinely during rehab and add in resistance training at home;Long Term: Improve cardiorespiratory fitness, muscular endurance and strength as measured by increased METs and functional capacity ( )       Able to understand and use rate of perceived exertion (RPE) scale Yes       Intervention Provide education  and explanation on how to use RPE scale       Expected Outcomes Short Term: Able to use RPE daily in rehab to express subjective intensity level;Long Term:  Able to use RPE to guide intensity level when exercising independently       Able to understand and use Dyspnea scale Yes       Intervention Provide education and explanation on how to use Dyspnea scale       Expected Outcomes Short Term: Able to use Dyspnea scale daily in rehab to express subjective sense of shortness of breath during exertion;Long Term: Able to use Dyspnea scale to guide intensity level when exercising independently       Knowledge and understanding of Target Heart Rate Range (THRR) Yes       Intervention Provide education and explanation of THRR including how the numbers were predicted and where they are located for  reference       Expected Outcomes Short Term: Able to state/look up THRR;Long Term: Able to use THRR to govern intensity when exercising independently;Short Term: Able to use daily as guideline for intensity in rehab       Understanding of Exercise Prescription Yes       Intervention Provide education, explanation, and written materials on patient's individual exercise prescription       Expected Outcomes Short Term: Able to explain program exercise prescription;Long Term: Able to explain home exercise prescription to exercise independently                Exercise Goals Re-Evaluation :   Discharge Exercise Prescription (Final Exercise Prescription Changes):   Nutrition:  Target Goals: Understanding of nutrition guidelines, daily intake of sodium 1500mg , cholesterol 200mg , calories 30% from fat and 7% or less from saturated fats, daily to have 5 or more servings of fruits and vegetables.  Biometrics:  Pre Biometrics - 01/24/23 1258       Pre Biometrics   Grip Strength 28 kg              Nutrition Therapy Plan and Nutrition Goals:   Nutrition Assessments:  MEDIFICTS Score Key: ?70 Need to make dietary changes  40-70 Heart Healthy Diet ? 40 Therapeutic Level Cholesterol Diet   Picture Your Plate Scores: <16 Unhealthy dietary pattern with much room for improvement. 41-50 Dietary pattern unlikely to meet recommendations for good health and room for improvement. 51-60 More healthful dietary pattern, with some room for improvement.  >60 Healthy dietary pattern, although there may be some specific behaviors that could be improved.    Nutrition Goals Re-Evaluation:   Nutrition Goals Discharge (Final Nutrition Goals Re-Evaluation):   Psychosocial: Target Goals: Acknowledge presence or absence of significant depression and/or stress, maximize coping skills, provide positive support system. Participant is able to verbalize types and ability to use techniques and  skills needed for reducing stress and depression.  Initial Review & Psychosocial Screening:  Initial Psych Review & Screening - 01/24/23 1128       Initial Review   Current issues with Current Depression;Current Psychotropic Meds;Current Anxiety/Panic;Current Stress Concerns;Current Sleep Concerns    Source of Stress Concerns Chronic Illness;Family    Comments Pt's is currently under stress due to his brother being recently diagnosed with ALS and also his wife has multiple myeloma. Pt also has sleep concerns due to sleep apnea and narcolepsy. Pt does see a therapist and is on medications for depression at this time.      Family Dynamics   Good  Support System? Yes      Barriers   Psychosocial barriers to participate in program The patient should benefit from training in stress management and relaxation.;Psychosocial barriers identified (see note)      Screening Interventions   Interventions Encouraged to exercise    Expected Outcomes Short Term goal: Utilizing psychosocial counselor, staff and physician to assist with identification of specific Stressors or current issues interfering with healing process. Setting desired goal for each stressor or current issue identified.;Long Term Goal: Stressors or current issues are controlled or eliminated.;Short Term goal: Identification and review with participant of any Quality of Life or Depression concerns found by scoring the questionnaire.;Long Term goal: The participant improves quality of Life and PHQ9 Scores as seen by post scores and/or verbalization of changes             Quality of Life Scores:  Scores of 19 and below usually indicate a poorer quality of life in these areas.  A difference of  2-3 points is a clinically meaningful difference.  A difference of 2-3 points in the total score of the Quality of Life Index has been associated with significant improvement in overall quality of life, self-image, physical symptoms, and general  health in studies assessing change in quality of life.  PHQ-9: Review Flowsheet       01/24/2023  Depression screen PHQ 2/9  Decreased Interest 2  Down, Depressed, Hopeless 0  PHQ - 2 Score 2  Altered sleeping 3  Tired, decreased energy 2  Change in appetite 1  Feeling bad or failure about yourself  2  Trouble concentrating 3  Moving slowly or fidgety/restless 1  Suicidal thoughts 0  PHQ-9 Score 14  Difficult doing work/chores Somewhat difficult   Interpretation of Total Score  Total Score Depression Severity:  1-4 = Minimal depression, 5-9 = Mild depression, 10-14 = Moderate depression, 15-19 = Moderately severe depression, 20-27 = Severe depression   Psychosocial Evaluation and Intervention:  Psychosocial Evaluation - 01/24/23 1131       Psychosocial Evaluation & Interventions   Interventions Stress management education;Relaxation education;Encouraged to exercise with the program and follow exercise prescription    Comments Linford is currently stressed over his brother's recent diagnosis of ALS. He's also dealing with his wifes diagnosis of multiple myeloma.    Expected Outcomes For Robson to participate in PR free of any psychosocial barriers or concerns.    Continue Psychosocial Services  Follow up required by staff             Psychosocial Re-Evaluation:   Psychosocial Discharge (Final Psychosocial Re-Evaluation):   Education: Education Goals: Education classes will be provided on a weekly basis, covering required topics. Participant will state understanding/return demonstration of topics presented.  Learning Barriers/Preferences:  Learning Barriers/Preferences - 01/24/23 1132       Learning Barriers/Preferences   Learning Barriers Sight;Reading   pt is dyslexic   Learning Preferences Skilled Demonstration             Education Topics: Introduction to Pulmonary Rehab Group instruction provided by PowerPoint, verbal discussion, and written material  to support subject matter. Instructor reviews what Pulmonary Rehab is, the purpose of the program, and how patients are referred.     Know Your Numbers Group instruction that is supported by a PowerPoint presentation. Instructor discusses importance of knowing and understanding resting, exercise, and post-exercise oxygen saturation, heart rate, and blood pressure. Oxygen saturation, heart rate, blood pressure, rating of perceived exertion, and dyspnea are reviewed along  with a normal range for these values.    Exercise for the Pulmonary Patient Group instruction that is supported by a PowerPoint presentation. Instructor discusses benefits of exercise, core components of exercise, frequency, duration, and intensity of an exercise routine, importance of utilizing pulse oximetry during exercise, safety while exercising, and options of places to exercise outside of rehab.       MET Level  Group instruction provided by PowerPoint, verbal discussion, and written material to support subject matter. Instructor reviews what METs are and how to increase METs.    Pulmonary Medications Verbally interactive group education provided by instructor with focus on inhaled medications and proper administration.   Anatomy and Physiology of the Respiratory System Group instruction provided by PowerPoint, verbal discussion, and written material to support subject matter. Instructor reviews respiratory cycle and anatomical components of the respiratory system and their functions. Instructor also reviews differences in obstructive and restrictive respiratory diseases with examples of each.    Oxygen Safety Group instruction provided by PowerPoint, verbal discussion, and written material to support subject matter. There is an overview of "What is Oxygen" and "Why do we need it".  Instructor also reviews how to create a safe environment for oxygen use, the importance of using oxygen as prescribed, and the risks of  noncompliance. There is a brief discussion on traveling with oxygen and resources the patient may utilize.   Oxygen Use Group instruction provided by PowerPoint, verbal discussion, and written material to discuss how supplemental oxygen is prescribed and different types of oxygen supply systems. Resources for more information are provided.    Breathing Techniques Group instruction that is supported by demonstration and informational handouts. Instructor discusses the benefits of pursed lip and diaphragmatic breathing and detailed demonstration on how to perform both.     Risk Factor Reduction Group instruction that is supported by a PowerPoint presentation. Instructor discusses the definition of a risk factor, different risk factors for pulmonary disease, and how the heart and lungs work together.   MD Day A group question and answer session with a medical doctor that allows participants to ask questions that relate to their pulmonary disease state.   Nutrition for the Pulmonary Patient Group instruction provided by PowerPoint slides, verbal discussion, and written materials to support subject matter. The instructor gives an explanation and review of healthy diet recommendations, which includes a discussion on weight management, recommendations for fruit and vegetable consumption, as well as protein, fluid, caffeine, fiber, sodium, sugar, and alcohol. Tips for eating when patients are short of breath are discussed.    Other Education Group or individual verbal, written, or video instructions that support the educational goals of the pulmonary rehab program.    Knowledge Questionnaire Score:  Knowledge Questionnaire Score - 01/24/23 1155       Knowledge Questionnaire Score   Pre Score 13/18             Core Components/Risk Factors/Patient Goals at Admission:  Personal Goals and Risk Factors at Admission - 01/24/23 1133       Core Components/Risk Factors/Patient Goals on  Admission    Weight Management Weight Loss    Improve shortness of breath with ADL's Yes    Intervention Provide education, individualized exercise plan and daily activity instruction to help decrease symptoms of SOB with activities of daily living.    Expected Outcomes Short Term: Improve cardiorespiratory fitness to achieve a reduction of symptoms when performing ADLs    Hypertension Yes    Intervention Provide  education on lifestyle modifcations including regular physical activity/exercise, weight management, moderate sodium restriction and increased consumption of fresh fruit, vegetables, and low fat dairy, alcohol moderation, and smoking cessation.;Monitor prescription use compliance.    Expected Outcomes Short Term: Continued assessment and intervention until BP is < 140/57mm HG in hypertensive participants. < 130/18mm HG in hypertensive participants with diabetes, heart failure or chronic kidney disease.;Long Term: Maintenance of blood pressure at goal levels.    Stress Yes    Intervention Offer individual and/or small group education and counseling on adjustment to heart disease, stress management and health-related lifestyle change. Teach and support self-help strategies.;Refer participants experiencing significant psychosocial distress to appropriate mental health specialists for further evaluation and treatment. When possible, include family members and significant others in education/counseling sessions.    Expected Outcomes Short Term: Participant demonstrates changes in health-related behavior, relaxation and other stress management skills, ability to obtain effective social support, and compliance with psychotropic medications if prescribed.;Long Term: Emotional wellbeing is indicated by absence of clinically significant psychosocial distress or social isolation.             Core Components/Risk Factors/Patient Goals Review:    Core Components/Risk Factors/Patient Goals at  Discharge (Final Review):    ITP Comments:   Comments: Dr. Mechele Collin is Medical Director for Pulmonary Rehab at [facility].

## 2023-01-26 NOTE — Progress Notes (Signed)
Pulmonary Individual Treatment Plan  Patient Details  Name: [name] MRN: [id number] Date of Birth: 06-19-1951 Referring Provider:   [name] Pulmonary Rehab Walk Test from 01/24/2023 in [facility]  Referring Provider [name]       Initial Encounter Date:  Flowsheet Row Pulmonary Rehab Walk Test from 01/24/2023 in [facility]  Date 01/24/23       Visit Diagnosis: Stage 2 moderate COPD by GOLD classification (HCC)  Patient's Home Medications on Admission:   Current Outpatient Medications:    albuterol (PROVENTIL) (2.5 MG/3ML) 0.083% nebulizer solution, Take 3 mLs (2.5 mg total) by nebulization every 4 (four) hours as needed for wheezing or shortness of breath., Disp: 1080 mL, Rfl: 2   albuterol (VENTOLIN HFA) 108 (90 Base) MCG/ACT inhaler, Inhale 2 puffs into the lungs every 6 (six) hours as needed for wheezing or shortness of breath., Disp: 8 g, Rfl: 5   amLODipine (NORVASC) 10 MG tablet, Take 10 mg by mouth daily., Disp: , Rfl:    aspirin EC 81 MG tablet, Take 81 mg by mouth daily. Swallow whole., Disp: , Rfl:    b complex vitamins capsule, Take 1 capsule by mouth daily., Disp: , Rfl:    buPROPion (WELLBUTRIN SR) 150 MG 12 hr tablet, Take 150 mg by mouth daily., Disp: , Rfl:    carbamide peroxide (DEBROX) 6.5 % OTIC solution, Place 5 drops into both ears as needed (ear wax)., Disp: , Rfl:    Cholecalciferol (VITAMIN D3) 1000 UNITS CAPS, Take 1,000 Units by mouth every morning., Disp: , Rfl:    empagliflozin (JARDIANCE) 25 MG TABS tablet, Take 25 mg by mouth daily. Take 1/2 tablet every morning, Disp: , Rfl:    FERGON 240 (27 Fe) MG tablet, Take 240 mg by mouth daily., Disp: , Rfl:    fexofenadine (ALLEGRA) 180 MG tablet, Take 180 mg by mouth daily., Disp: , Rfl:    fluticasone (FLONASE) 50 MCG/ACT nasal spray, Place 1 spray into both nostrils in the  morning and at bedtime., Disp: , Rfl:    fluticasone-salmeterol (WIXELA INHUB) 250-50 MCG/ACT AEPB, Inhale 1 puff into the lungs in the morning and at bedtime., Disp: 1 each, Rfl: 5   gabapentin (NEURONTIN) 400 MG capsule, Take 400 mg by mouth 3 (three) times daily., Disp: , Rfl:    hydrALAZINE (APRESOLINE) 50 MG tablet, Take 50 mg by mouth 3 (three) times daily., Disp: , Rfl:    hydrOXYzine (ATARAX/VISTARIL) 10 MG tablet, Take 20 mg by mouth at bedtime., Disp: , Rfl:    losartan (COZAAR) 100 MG tablet, Take 100 mg by mouth every evening., Disp: , Rfl:    melatonin 3 MG TABS tablet, Take 3 mg by mouth at bedtime., Disp: , Rfl:    metFORMIN (GLUCOPHAGE) 500 MG tablet, Take 500 mg by mouth daily with breakfast., Disp: , Rfl:    modafinil (PROVIGIL) 100 MG tablet, Take 200 mg (2 tablets) in the morning and 100 mg (one tablet) at noon as directed, Disp: 90 tablet, Rfl: 2   NON FORMULARY, BIPAP at bedtime, Disp: , Rfl:    omeprazole (PRILOSEC OTC) 20 MG tablet, Take 1 tablet (20 mg total) by mouth daily., Disp: 30 tablet, Rfl: 2   PARoxetine (PAXIL) 40 MG tablet, Take 40 mg by mouth at bedtime., Disp: , Rfl:    pravastatin (PRAVACHOL) 40 MG tablet, Take 40 mg by  mouth at bedtime., Disp: , Rfl:    Probiotic Product (PROBIOTIC DAILY PO), Take 1 capsule by mouth daily., Disp: , Rfl:    QUEtiapine (SEROQUEL) 25 MG tablet, Take 50 mg by mouth at bedtime., Disp: , Rfl:    spironolactone (ALDACTONE) 25 MG tablet, Take 50 mg by mouth daily. , Disp: , Rfl:    tamsulosin (FLOMAX) 0.4 MG CAPS capsule, Take 0.4 mg by mouth every morning., Disp: , Rfl:    Tiotropium Bromide Monohydrate (SPIRIVA RESPIMAT) 2.5 MCG/ACT AERS, Inhale 2 puffs into the lungs daily., Disp: 1 g, Rfl: 5   triamcinolone ointment (KENALOG) 0.1 %, Apply 1 application  topically daily as needed (rash)., Disp: , Rfl:    Turmeric 400 MG CAPS, Take 400 mg by mouth daily., Disp: , Rfl:    vitamin E 400 UNIT capsule, Take 400 Units by mouth daily.,  Disp: , Rfl:   Past Medical History: Past Medical History:  Diagnosis Date   Abnormal CT scan of lung 07/23/2021   Abnormality of gait    Allergic rhinitis due to pollen 11/25/2010   Bilateral primary osteoarthritis of hip 12/12/2019   Carpal tunnel syndrome on right    Contact with and (suspected) exposure to asbestos 07/23/2021   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   Hyperlipidemia    Hypothyroidism    Klippel-Feil sequence    Learning disability, unspecified 1997   Reading/reading comprehension   Left inguinal hernia    Low back pain 12/12/2019   Lumbar spondylosis    Major depressive disorder    Mild cognitive impairment 10/18/2022   Nonspecific abnormal electrocardiogram (ECG) (EKG)    Obstructive sleep apnea 12/15/2015   Osteoarthritis    Osteoporosis    Pain in joints of right hand    Peripheral neuropathy    Peripheral venous insufficiency    Pituitary abnormality    pituitary edema; takes bromocriptine   PTSD (post-traumatic stress disorder) 11/25/2010   Spinal stenosis of lumbar region 09/14/2018   Type 2 diabetes mellitus without complications 11/25/2010   Vitamin D deficiency    Weakness of left leg    Wears glasses     Tobacco Use: Social History   Tobacco Use  Smoking Status Never  Smokeless Tobacco Never    Labs: Review Flowsheet       Latest Ref Rng & Units 08/06/2008 07/18/2010 09/05/2012  Labs for ITP Cardiac and Pulmonary Rehab  Hemoglobin A1c 4.0 - 6.0 % 6.7 (NOTE)   The ADA recommends the  following therapeutic goal for glycemic   control related to Hgb A1C measurement:   Goal of Therapy:   < 7.0% Hgb A1C   Reference: American Diabetes Association: Clinical Practice   Recommendations 2008, Diabetes Care,  2008, 31:(Suppl 1).  - 6.3   TCO2 0 - 100 mmol/L - 30  -    Capillary Blood Glucose: Lab Results  Component Value Date   GLUCAP 120 (H) 10/12/2022   GLUCAP 114 (H) 10/12/2022   GLUCAP 108 (H) 10/12/2022   GLUCAP 134 (H) 09/06/2019  GLUCAP 105 (H) 09/06/2019    POCT Glucose     Row Name 01/24/23 1156             POCT Blood Glucose   Pre-Exercise 157 mg/dL                Pulmonary Assessment Scores:  Pulmonary Assessment Scores     Row Name 01/24/23 1153         ADL UCSD   ADL Phase Entry     SOB Score total 56       CAT Score   CAT Score 22       mMRC Score   mMRC Score 3             UCSD: Self-administered rating of dyspnea associated with activities of daily living (ADLs) 6-point scale (0 = "not at all" to 5 = "maximal or unable to do because of breathlessness")  Scoring Scores range from 0 to 120.  Minimally important difference is 5 units  CAT: CAT can identify the health impairment of COPD patients and is better correlated with disease progression.  CAT has a scoring range of zero to 40. The CAT score is classified into four groups of low (less than 10), medium (10 - 20), high (21-30) and very high (31-40) based on the impact level of disease on health status. A CAT score over 10 suggests significant symptoms.  A worsening CAT score could be explained by an exacerbation, poor medication adherence, poor inhaler technique, or progression of COPD or comorbid conditions.  CAT MCID is 2 points  mMRC: mMRC (Modified Medical Research Council) Dyspnea Scale is used to assess the degree of baseline functional disability in patients of respiratory disease due to dyspnea. No minimal important difference is established. A decrease in score of  1 point or greater is considered a positive change.   Pulmonary Function Assessment:  Pulmonary Function Assessment - 01/24/23 1213       Breath   Bilateral Breath Sounds Clear    Shortness of Breath Yes             Exercise Target Goals: Exercise Program Goal: Individual exercise prescription set using results from initial 6 min walk test and THRR while considering  patient's activity barriers and safety.   Exercise Prescription Goal: Initial exercise prescription builds to 30-45 minutes a day of aerobic activity, 2-3 days per week.  Home exercise guidelines will be given to patient during program as part of exercise prescription that the participant will acknowledge.  Activity Barriers & Risk Stratification:  Activity Barriers & Cardiac Risk Stratification - 01/24/23 1135       Activity Barriers & Cardiac Risk Stratification   Activity Barriers Muscular Weakness;Arthritis;Deconditioning;Shortness of Breath;Left Knee Replacement;Right Knee Replacement;Right Hip Replacement;Back Problems;Neck/Spine Problems;Balance Concerns    Cardiac Risk Stratification Moderate             6 Minute Walk:  6 Minute Walk     Row Name 01/26/23 0839         6 Minute Walk   Phase Initial     Distance 735 feet     Walk Time 6 minutes     # of Rest Breaks 0     MPH 1.39     METS 2.02     RPE 11     Perceived Dyspnea  1     VO2 Peak 7.05     Symptoms No     Resting HR 58 bpm  Resting BP 164/50     Resting Oxygen Saturation  98 %     Exercise Oxygen Saturation  during 6 min walk 96 %     Max Ex. HR 112 bpm     Max Ex. BP 166/58     2 Minute Post BP 158/58       Interval HR   1 Minute HR 103     2 Minute HR 107     3 Minute HR 107     4 Minute HR 108     5 Minute HR 109     6 Minute HR 112     2 Minute Post HR 65     Interval Heart Rate? Yes       Interval Oxygen   Interval Oxygen? Yes     Baseline Oxygen Saturation % 98 %     1 Minute Oxygen Saturation % 96 %      1 Minute Liters of Oxygen 0 L     2 Minute Oxygen Saturation % 96 %     2 Minute Liters of Oxygen 0 L     3 Minute Oxygen Saturation % 97 %     3 Minute Liters of Oxygen 0 L     4 Minute Oxygen Saturation % 96 %     4 Minute Liters of Oxygen 0 L     5 Minute Oxygen Saturation % 97 %     5 Minute Liters of Oxygen 0 L     6 Minute Oxygen Saturation % 97 %     6 Minute Liters of Oxygen 0 L     2 Minute Post Oxygen Saturation % 98 %     2 Minute Post Liters of Oxygen 0 L              Oxygen Initial Assessment:  Oxygen Initial Assessment - 01/24/23 1137       Home Oxygen   Home Oxygen Device None    Sleep Oxygen Prescription BiPAP    Home Exercise Oxygen Prescription None    Home Resting Oxygen Prescription None      Initial 6 min Walk   Oxygen Used None      Program Oxygen Prescription   Program Oxygen Prescription None      Intervention   Short Term Goals To learn and exhibit compliance with exercise, home and travel O2 prescription;To learn and understand importance of maintaining oxygen saturations>88%;To learn and demonstrate proper use of respiratory medications;To learn and understand importance of monitoring SPO2 with pulse oximeter and demonstrate accurate use of the pulse oximeter.;To learn and demonstrate proper pursed lip breathing techniques or other breathing techniques.     Long  Term Goals Exhibits compliance with exercise, home  and travel O2 prescription;Maintenance of O2 saturations>88%;Compliance with respiratory medication;Verbalizes importance of monitoring SPO2 with pulse oximeter and return demonstration;Exhibits proper breathing techniques, such as pursed lip breathing or other method taught during program session;Demonstrates proper use of MDI's             Oxygen Re-Evaluation:  Oxygen Re-Evaluation     Row Name 01/25/23 0753             Program Oxygen Prescription   Program Oxygen Prescription None         Home Oxygen   Home Oxygen  Device None       Sleep Oxygen Prescription BiPAP       Home Exercise Oxygen Prescription None  Home Resting Oxygen Prescription None         Goals/Expected Outcomes   Short Term Goals To learn and exhibit compliance with exercise, home and travel O2 prescription;To learn and understand importance of maintaining oxygen saturations>88%;To learn and demonstrate proper use of respiratory medications;To learn and understand importance of monitoring SPO2 with pulse oximeter and demonstrate accurate use of the pulse oximeter.;To learn and demonstrate proper pursed lip breathing techniques or other breathing techniques.        Long  Term Goals Exhibits compliance with exercise, home  and travel O2 prescription;Maintenance of O2 saturations>88%;Compliance with respiratory medication;Verbalizes importance of monitoring SPO2 with pulse oximeter and return demonstration;Exhibits proper breathing techniques, such as pursed lip breathing or other method taught during program session;Demonstrates proper use of MDI's       Goals/Expected Outcomes Compliance and understanding of oxygen saturation monitoring and breathing techniques to decrease shortness of breath.                Oxygen Discharge (Final Oxygen Re-Evaluation):  Oxygen Re-Evaluation - 01/25/23 0753       Program Oxygen Prescription   Program Oxygen Prescription None      Home Oxygen   Home Oxygen Device None    Sleep Oxygen Prescription BiPAP    Home Exercise Oxygen Prescription None    Home Resting Oxygen Prescription None      Goals/Expected Outcomes   Short Term Goals To learn and exhibit compliance with exercise, home and travel O2 prescription;To learn and understand importance of maintaining oxygen saturations>88%;To learn and demonstrate proper use of respiratory medications;To learn and understand importance of monitoring SPO2 with pulse oximeter and demonstrate accurate use of the pulse oximeter.;To learn and demonstrate  proper pursed lip breathing techniques or other breathing techniques.     Long  Term Goals Exhibits compliance with exercise, home  and travel O2 prescription;Maintenance of O2 saturations>88%;Compliance with respiratory medication;Verbalizes importance of monitoring SPO2 with pulse oximeter and return demonstration;Exhibits proper breathing techniques, such as pursed lip breathing or other method taught during program session;Demonstrates proper use of MDI's    Goals/Expected Outcomes Compliance and understanding of oxygen saturation monitoring and breathing techniques to decrease shortness of breath.             Initial Exercise Prescription:  Initial Exercise Prescription - 01/24/23 1200       Date of Initial Exercise RX and Referring Provider   Date 01/24/23    Referring Provider [name]    Expected Discharge Date 04/21/23      Recumbant Bike   Level 1    RPM 60    Minutes 15    METs 2      Recumbant Elliptical   Level 1    RPM 60    Minutes 15    METs 2      Prescription Details   Frequency (times per week) 2    Duration Progress to 30 minutes of continuous aerobic without signs/symptoms of physical distress      Intensity   THRR 40-80% of Max Heartrate 57-114    Ratings of Perceived Exertion 11-13    Perceived Dyspnea 0-4      Progression   Progression Continue progressive overload as per policy without signs/symptoms or physical distress.      Resistance Training   Training Prescription Yes    Weight blue bands    Reps 10-15             Perform Capillary Blood Glucose  checks as needed.  Exercise Prescription Changes:   Exercise Comments:   Exercise Goals and Review:   Exercise Goals     Row Name 01/24/23 1137 01/25/23 0751           Exercise Goals   Increase Physical Activity Yes Yes      Intervention Provide advice, education, support and counseling about physical activity/exercise needs.;Develop an individualized exercise prescription  for aerobic and resistive training based on initial evaluation findings, risk stratification, comorbidities and participant's personal goals. Provide advice, education, support and counseling about physical activity/exercise needs.;Develop an individualized exercise prescription for aerobic and resistive training based on initial evaluation findings, risk stratification, comorbidities and participant's personal goals.      Expected Outcomes Short Term: Attend rehab on a regular basis to increase amount of physical activity.;Long Term: Exercising regularly at least 3-5 days a week.;Long Term: Add in home exercise to make exercise part of routine and to increase amount of physical activity. Short Term: Attend rehab on a regular basis to increase amount of physical activity.;Long Term: Exercising regularly at least 3-5 days a week.;Long Term: Add in home exercise to make exercise part of routine and to increase amount of physical activity.      Increase Strength and Stamina Yes Yes      Intervention Provide advice, education, support and counseling about physical activity/exercise needs.;Develop an individualized exercise prescription for aerobic and resistive training based on initial evaluation findings, risk stratification, comorbidities and participant's personal goals. Provide advice, education, support and counseling about physical activity/exercise needs.;Develop an individualized exercise prescription for aerobic and resistive training based on initial evaluation findings, risk stratification, comorbidities and participant's personal goals.      Expected Outcomes Short Term: Increase workloads from initial exercise prescription for resistance, speed, and METs.;Short Term: Perform resistance training exercises routinely during rehab and add in resistance training at home;Long Term: Improve cardiorespiratory fitness, muscular endurance and strength as measured by increased METs and functional capacity ( )  Short Term: Increase workloads from initial exercise prescription for resistance, speed, and METs.;Short Term: Perform resistance training exercises routinely during rehab and add in resistance training at home;Long Term: Improve cardiorespiratory fitness, muscular endurance and strength as measured by increased METs and functional capacity ( )      Able to understand and use rate of perceived exertion (RPE) scale Yes Yes      Intervention Provide education and explanation on how to use RPE scale Provide education and explanation on how to use RPE scale      Expected Outcomes Short Term: Able to use RPE daily in rehab to express subjective intensity level;Long Term:  Able to use RPE to guide intensity level when exercising independently Short Term: Able to use RPE daily in rehab to express subjective intensity level;Long Term:  Able to use RPE to guide intensity level when exercising independently      Able to understand and use Dyspnea scale Yes Yes      Intervention Provide education and explanation on how to use Dyspnea scale Provide education and explanation on how to use Dyspnea scale      Expected Outcomes Short Term: Able to use Dyspnea scale daily in rehab to express subjective sense of shortness of breath during exertion;Long Term: Able to use Dyspnea scale to guide intensity level when exercising independently Short Term: Able to use Dyspnea scale daily in rehab to express subjective sense of shortness of breath during exertion;Long Term: Able to use Dyspnea scale to guide intensity level  when exercising independently      Knowledge and understanding of Target Heart Rate Range (THRR) Yes Yes      Intervention Provide education and explanation of THRR including how the numbers were predicted and where they are located for reference Provide education and explanation of THRR including how the numbers were predicted and where they are located for reference      Expected Outcomes Short Term: Able to  state/look up THRR;Long Term: Able to use THRR to govern intensity when exercising independently;Short Term: Able to use daily as guideline for intensity in rehab Short Term: Able to state/look up THRR;Long Term: Able to use THRR to govern intensity when exercising independently;Short Term: Able to use daily as guideline for intensity in rehab      Understanding of Exercise Prescription Yes Yes      Intervention Provide education, explanation, and written materials on patient's individual exercise prescription Provide education, explanation, and written materials on patient's individual exercise prescription      Expected Outcomes Short Term: Able to explain program exercise prescription;Long Term: Able to explain home exercise prescription to exercise independently Short Term: Able to explain program exercise prescription;Long Term: Able to explain home exercise prescription to exercise independently               Exercise Goals Re-Evaluation :  Exercise Goals Re-Evaluation     Row Name 01/25/23 0752             Exercise Goal Re-Evaluation   Exercise Goals Review Increase Physical Activity;Able to understand and use Dyspnea scale;Understanding of Exercise Prescription;Increase Strength and Stamina;Knowledge and understanding of Target Heart Rate Range (THRR);Able to understand and use rate of perceived exertion (RPE) scale       Comments Pt is scheduled to begin exercise next week. Will continue to monitor and progress as able.       Expected Outcomes Through exercise at rehab and home, the patient will decrease shortness of breath with daily activities and feel confident in carrying out an exercise regimen at home.                Discharge Exercise Prescription (Final Exercise Prescription Changes):   Nutrition:  Target Goals: Understanding of nutrition guidelines, daily intake of sodium 1500mg , cholesterol 200mg , calories 30% from fat and 7% or less from saturated fats, daily  to have 5 or more servings of fruits and vegetables.  Biometrics:  Pre Biometrics - 01/24/23 1258       Pre Biometrics   Grip Strength 28 kg              Nutrition Therapy Plan and Nutrition Goals:   Nutrition Assessments:  MEDIFICTS Score Key: ?70 Need to make dietary changes  40-70 Heart Healthy Diet ? 40 Therapeutic Level Cholesterol Diet   Picture Your Plate Scores: <14 Unhealthy dietary pattern with much room for improvement. 41-50 Dietary pattern unlikely to meet recommendations for good health and room for improvement. 51-60 More healthful dietary pattern, with some room for improvement.  >60 Healthy dietary pattern, although there may be some specific behaviors that could be improved.    Nutrition Goals Re-Evaluation:   Nutrition Goals Discharge (Final Nutrition Goals Re-Evaluation):   Psychosocial: Target Goals: Acknowledge presence or absence of significant depression and/or stress, maximize coping skills, provide positive support system. Participant is able to verbalize types and ability to use techniques and skills needed for reducing stress and depression.  Initial Review & Psychosocial Screening:  Initial Psych Review &  Screening - 01/24/23 1128       Initial Review   Current issues with Current Depression;Current Psychotropic Meds;Current Anxiety/Panic;Current Stress Concerns;Current Sleep Concerns    Source of Stress Concerns Chronic Illness;Family    Comments Pt's is currently under stress due to his brother being recently diagnosed with ALS and also his wife has multiple myeloma. Pt also has sleep concerns due to sleep apnea and narcolepsy. Pt does see a therapist and is on medications for depression at this time.      Family Dynamics   Good Support System? Yes      Barriers   Psychosocial barriers to participate in program The patient should benefit from training in stress management and relaxation.;Psychosocial barriers identified (see note)       Screening Interventions   Interventions Encouraged to exercise    Expected Outcomes Short Term goal: Utilizing psychosocial counselor, staff and physician to assist with identification of specific Stressors or current issues interfering with healing process. Setting desired goal for each stressor or current issue identified.;Long Term Goal: Stressors or current issues are controlled or eliminated.;Short Term goal: Identification and review with participant of any Quality of Life or Depression concerns found by scoring the questionnaire.;Long Term goal: The participant improves quality of Life and PHQ9 Scores as seen by post scores and/or verbalization of changes             Quality of Life Scores:  Scores of 19 and below usually indicate a poorer quality of life in these areas.  A difference of  2-3 points is a clinically meaningful difference.  A difference of 2-3 points in the total score of the Quality of Life Index has been associated with significant improvement in overall quality of life, self-image, physical symptoms, and general health in studies assessing change in quality of life.  PHQ-9: Review Flowsheet       01/24/2023  Depression screen PHQ 2/9  Decreased Interest 2  Down, Depressed, Hopeless 0  PHQ - 2 Score 2  Altered sleeping 3  Tired, decreased energy 2  Change in appetite 1  Feeling bad or failure about yourself  2  Trouble concentrating 3  Moving slowly or fidgety/restless 1  Suicidal thoughts 0  PHQ-9 Score 14  Difficult doing work/chores Somewhat difficult   Interpretation of Total Score  Total Score Depression Severity:  1-4 = Minimal depression, 5-9 = Mild depression, 10-14 = Moderate depression, 15-19 = Moderately severe depression, 20-27 = Severe depression   Psychosocial Evaluation and Intervention:  Psychosocial Evaluation - 01/24/23 1131       Psychosocial Evaluation & Interventions   Interventions Stress management education;Relaxation  education;Encouraged to exercise with the program and follow exercise prescription    Comments Harbert is currently stressed over his brother's recent diagnosis of ALS. He's also dealing with his wifes diagnosis of multiple myeloma.    Expected Outcomes For Muhanad to participate in PR free of any psychosocial barriers or concerns.    Continue Psychosocial Services  Follow up required by staff             Psychosocial Re-Evaluation:  Psychosocial Re-Evaluation     Row Name 01/24/23 1301             Psychosocial Re-Evaluation   Current issues with Current Depression;Current Anxiety/Panic;Current Psychotropic Meds;Current Stress Concerns;Current Sleep Concerns       Comments No new psychosocial barriers or concerns since orientation on 5/10.       Expected Outcomes For pt to  partcipate in PR free of any psychosocial barriers or concerns.       Interventions Encouraged to attend Pulmonary Rehabilitation for the exercise;Stress management education       Continue Psychosocial Services  Follow up required by staff                Psychosocial Discharge (Final Psychosocial Re-Evaluation):  Psychosocial Re-Evaluation - 01/24/23 1301       Psychosocial Re-Evaluation   Current issues with Current Depression;Current Anxiety/Panic;Current Psychotropic Meds;Current Stress Concerns;Current Sleep Concerns    Comments No new psychosocial barriers or concerns since orientation on 5/10.    Expected Outcomes For pt to partcipate in PR free of any psychosocial barriers or concerns.    Interventions Encouraged to attend Pulmonary Rehabilitation for the exercise;Stress management education    Continue Psychosocial Services  Follow up required by staff             Education: Education Goals: Education classes will be provided on a weekly basis, covering required topics. Participant will state understanding/return demonstration of topics presented.  Learning Barriers/Preferences:   Learning Barriers/Preferences - 01/24/23 1132       Learning Barriers/Preferences   Learning Barriers Sight;Reading   pt is dyslexic   Learning Preferences Skilled Demonstration             Education Topics: Introduction to Pulmonary Rehab Group instruction provided by PowerPoint, verbal discussion, and written material to support subject matter. Instructor reviews what Pulmonary Rehab is, the purpose of the program, and how patients are referred.     Know Your Numbers Group instruction that is supported by a PowerPoint presentation. Instructor discusses importance of knowing and understanding resting, exercise, and post-exercise oxygen saturation, heart rate, and blood pressure. Oxygen saturation, heart rate, blood pressure, rating of perceived exertion, and dyspnea are reviewed along with a normal range for these values.    Exercise for the Pulmonary Patient Group instruction that is supported by a PowerPoint presentation. Instructor discusses benefits of exercise, core components of exercise, frequency, duration, and intensity of an exercise routine, importance of utilizing pulse oximetry during exercise, safety while exercising, and options of places to exercise outside of rehab.       MET Level  Group instruction provided by PowerPoint, verbal discussion, and written material to support subject matter. Instructor reviews what METs are and how to increase METs.    Pulmonary Medications Verbally interactive group education provided by instructor with focus on inhaled medications and proper administration.   Anatomy and Physiology of the Respiratory System Group instruction provided by PowerPoint, verbal discussion, and written material to support subject matter. Instructor reviews respiratory cycle and anatomical components of the respiratory system and their functions. Instructor also reviews differences in obstructive and restrictive respiratory diseases with examples of  each.    Oxygen Safety Group instruction provided by PowerPoint, verbal discussion, and written material to support subject matter. There is an overview of "What is Oxygen" and "Why do we need it".  Instructor also reviews how to create a safe environment for oxygen use, the importance of using oxygen as prescribed, and the risks of noncompliance. There is a brief discussion on traveling with oxygen and resources the patient may utilize.   Oxygen Use Group instruction provided by PowerPoint, verbal discussion, and written material to discuss how supplemental oxygen is prescribed and different types of oxygen supply systems. Resources for more information are provided.    Breathing Techniques Group instruction that is supported by demonstration and informational  handouts. Instructor discusses the benefits of pursed lip and diaphragmatic breathing and detailed demonstration on how to perform both.     Risk Factor Reduction Group instruction that is supported by a PowerPoint presentation. Instructor discusses the definition of a risk factor, different risk factors for pulmonary disease, and how the heart and lungs work together.   MD Day A group question and answer session with a medical doctor that allows participants to ask questions that relate to their pulmonary disease state.   Nutrition for the Pulmonary Patient Group instruction provided by PowerPoint slides, verbal discussion, and written materials to support subject matter. The instructor gives an explanation and review of healthy diet recommendations, which includes a discussion on weight management, recommendations for fruit and vegetable consumption, as well as protein, fluid, caffeine, fiber, sodium, sugar, and alcohol. Tips for eating when patients are short of breath are discussed.    Other Education Group or individual verbal, written, or video instructions that support the educational goals of the pulmonary rehab program.     Knowledge Questionnaire Score:  Knowledge Questionnaire Score - 01/24/23 1155       Knowledge Questionnaire Score   Pre Score 13/18             Core Components/Risk Factors/Patient Goals at Admission:  Personal Goals and Risk Factors at Admission - 01/24/23 1133       Core Components/Risk Factors/Patient Goals on Admission    Weight Management Weight Loss    Improve shortness of breath with ADL's Yes    Intervention Provide education, individualized exercise plan and daily activity instruction to help decrease symptoms of SOB with activities of daily living.    Expected Outcomes Short Term: Improve cardiorespiratory fitness to achieve a reduction of symptoms when performing ADLs    Hypertension Yes    Intervention Provide education on lifestyle modifcations including regular physical activity/exercise, weight management, moderate sodium restriction and increased consumption of fresh fruit, vegetables, and low fat dairy, alcohol moderation, and smoking cessation.;Monitor prescription use compliance.    Expected Outcomes Short Term: Continued assessment and intervention until BP is < 140/29mm HG in hypertensive participants. < 130/51mm HG in hypertensive participants with diabetes, heart failure or chronic kidney disease.;Long Term: Maintenance of blood pressure at goal levels.    Stress Yes    Intervention Offer individual and/or small group education and counseling on adjustment to heart disease, stress management and health-related lifestyle change. Teach and support self-help strategies.;Refer participants experiencing significant psychosocial distress to appropriate mental health specialists for further evaluation and treatment. When possible, include family members and significant others in education/counseling sessions.    Expected Outcomes Short Term: Participant demonstrates changes in health-related behavior, relaxation and other stress management skills, ability to obtain  effective social support, and compliance with psychotropic medications if prescribed.;Long Term: Emotional wellbeing is indicated by absence of clinically significant psychosocial distress or social isolation.             Core Components/Risk Factors/Patient Goals Review:   Goals and Risk Factor Review     Row Name 01/24/23 1302             Core Components/Risk Factors/Patient Goals Review   Personal Goals Review Weight Management/Obesity;Improve shortness of breath with ADL's;Develop more efficient breathing techniques such as purse lipped breathing and diaphragmatic breathing and practicing self-pacing with activity.;Hypertension       Review Pt is scheduled to start PR on 6/18       Expected Outcomes See admission goals  Core Components/Risk Factors/Patient Goals at Discharge (Final Review):   Goals and Risk Factor Review - 01/24/23 1302       Core Components/Risk Factors/Patient Goals Review   Personal Goals Review Weight Management/Obesity;Improve shortness of breath with ADL's;Develop more efficient breathing techniques such as purse lipped breathing and diaphragmatic breathing and practicing self-pacing with activity.;Hypertension    Review Pt is scheduled to start PR on 6/18    Expected Outcomes See admission goals             ITP Comments:Pt is making expected progress toward Pulmonary Rehab goals. Recommend continued exercise, life style modification, education, and utilization of breathing techniques to increase stamina and strength, while also decreasing shortness of breath with exertion.  Dr. Mechele Collin is Medical Director for Pulmonary Rehab at [facility].

## 2023-02-01 ENCOUNTER — Encounter (HOSPITAL_COMMUNITY)
Admission: RE | Admit: 2023-02-01 | Discharge: 2023-02-01 | Disposition: A | Discharge status: Home / Self Care | Payer: No Typology Code available for payment source | Source: Ambulatory Visit | Attending: Pulmonary Disease

## 2023-02-01 DIAGNOSIS — J449 Chronic obstructive pulmonary disease, unspecified: Secondary | ICD-10-CM

## 2023-02-01 LAB — GLUCOSE, CAPILLARY: Glucose-Capillary: 74 mg/dL (ref 70–99)

## 2023-02-01 NOTE — Progress Notes (Addendum)
Daily Session Note  Patient Details  Name: [name] MRN: [id number] Date of Birth: [birth year] Referring Provider:   [name] Pulmonary Rehab Walk Test from 01/24/2023 in [facility]  Referring Provider [name]       Encounter Date: 02/01/2023  Check In:  Session Check In - 02/01/23 1026       Check-In   Supervising physician immediately available to respond to emergencies [facility] MD immediately available    Physician(s) Bernadene Person, NP    Location [facility]    Staff Present Raford Pitcher, MS, ACSM-CEP, Exercise Physiologist;Casey Synthia Innocent, RN, BSN;Randi Reeve BS, ACSM-CEP, Exercise Physiologist    Virtual Visit No    Medication changes reported     No    Fall or balance concerns reported    No    Tobacco Cessation No Change    Warm-up and Cool-down Not performed (comment)    Resistance Training Performed No    VAD Patient? No    PAD/SET Patient? No      Pain Assessment   Currently in Pain? No/denies    Pain Score 0-No pain    Multiple Pain Sites No             Capillary Blood Glucose: No results found for this or any previous visit (from the past 24 hour(s)).    Social History   Tobacco Use  Smoking Status Never  Smokeless Tobacco Never    Goals Met:  Exercise tolerated well Strength training completed today  Goals Unmet:  BP was high during exercise at 212/78, had patient slow his speed and workload. Recheck BP after decreasing was 182/72.   Pre-exercise BP was 152/58, Post exercise BP was 146/64. Pt states his BP has highs and lows. Pt is taking his BP at home daily and is checking his Blood Sugars with his Freestyle. Asked pt to bring his log into class with him on Thursday.   Pre-exercise blood sugar was 152. Pt ate oatmeal for breakfast. Post exercise Blood sugar was 87 but dropping. Pt went to 79, wife stated he's never been that low. Juice, crackers,  and peanut butter provided. Recheck after 10-15 min was 71. Finger stick was 73. Glucose gel provided. Recheck was 88 and rising. Pt asymptomatic, wife drove patient home. Encouraged to eat lunch. Pt and wife understand without assistance.   Comments: Service time is from 1012 to 1211    Dr. Mechele Collin is Medical Director for Pulmonary Rehab at [facility].

## 2023-02-03 ENCOUNTER — Encounter (HOSPITAL_COMMUNITY)
Admission: RE | Admit: 2023-02-03 | Discharge: 2023-02-03 | Disposition: A | Discharge status: Home / Self Care | Payer: No Typology Code available for payment source | Source: Ambulatory Visit | Attending: Pulmonary Disease | Admitting: Pulmonary Disease

## 2023-02-03 DIAGNOSIS — J449 Chronic obstructive pulmonary disease, unspecified: Secondary | ICD-10-CM | POA: Diagnosis not present

## 2023-02-03 NOTE — Progress Notes (Signed)
Daily Session Note  Patient Details  Name: [name] MRN: [id number] Date of Birth: 1951/08/02 Referring Provider:   [name] Pulmonary Rehab Walk Test from 01/24/2023 in [facility] for Heart, V[facility], & Lung [facility]  Referring Provider [name]       Encounter Date: 02/03/2023  Check In:  Session Check In - 02/03/23 1115       Check-In   Supervising physician immediately available to respond to emergencies [facility] MD immediately available    Physician(s) Edd Fabian, NP    Location [facility]    Staff Present Raford Pitcher, MS, ACSM-CEP, Exercise Physiologist;Milos Milligan Synthia Innocent, RN, BSN    Virtual Visit No    Medication changes reported     No    Fall or balance concerns reported    No    Tobacco Cessation No Change    Warm-up and Cool-down Performed as group-led instruction    Resistance Training Performed No    VAD Patient? No    PAD/SET Patient? No      Pain Assessment   Currently in Pain? No/denies    Pain Score 0-No pain    Multiple Pain Sites No             Capillary Blood Glucose: No results found for this or any previous visit (from the past 24 hour(s)).    Social History   Tobacco Use  Smoking Status Never  Smokeless Tobacco Never    Goals Met:  Proper associated with RPD/PD & O2 Sat Independence with exercise equipment Exercise tolerated well No report of concerns or symptoms today Strength training completed today  Goals Unmet:  Not Applicable  Comments: Service time is from 1012 to 1140.    Dr. Mechele Collin is Medical Director for Pulmonary Rehab at [facility].

## 2023-02-08 ENCOUNTER — Encounter (HOSPITAL_COMMUNITY)
Admission: RE | Admit: 2023-02-08 | Discharge: 2023-02-08 | Disposition: A | Discharge status: Home / Self Care | Payer: No Typology Code available for payment source | Source: Ambulatory Visit | Attending: Pulmonary Disease | Admitting: Pulmonary Disease

## 2023-02-08 ENCOUNTER — Encounter (HOSPITAL_COMMUNITY): Payer: Self-pay

## 2023-02-08 VITALS — Wt 250.0 lb

## 2023-02-08 DIAGNOSIS — J449 Chronic obstructive pulmonary disease, unspecified: Secondary | ICD-10-CM

## 2023-02-08 NOTE — Progress Notes (Signed)
Daily Session Note  Patient Details  Name: [name] MRN: [id number] Date of Birth: [birth year] Referring Provider:   [name] Pulmonary Rehab Walk Test from 01/24/2023 in [facility], V[facility], [facility]  Referring Provider [name]       Encounter Date: 02/08/2023  Check In:  Session Check In - 02/08/23 1030       Check-In   Supervising physician immediately available to respond to emergencies [facility] MD immediately available    Physician(s) Edd Fabian, NP    [facility]    Staff Present Samantha Belarus, RD, Dutch Gray, RN, BSN;Randi Reeve BS, ACSM-CEP, Exercise Physiologist;Kaylee Earlene Plater, MS, ACSM-CEP, Exercise Physiologist;Casey Katrinka Blazing, RT    Virtual Visit No    Medication changes reported     No    Fall or balance concerns reported    No    Tobacco Cessation No Change    Warm-up and Cool-down Performed as group-led instruction    Resistance Training Performed Yes    VAD Patient? No    PAD/SET Patient? No      Pain Assessment   Currently in Pain? No/denies    Multiple Pain Sites No             Capillary Blood Glucose: No results found for this or any previous visit (from the past 24 hour(s)).   Exercise Prescription Changes - 02/08/23 1200       Response to Exercise   Blood Pressure (Admit) 172/50    Blood Pressure (Exercise) 198/64    Blood Pressure (Exit) 142/58    Heart Rate (Admit) 90 bpm    Heart Rate (Exercise) 124 bpm    Heart Rate (Exit) 96 bpm    Oxygen Saturation (Admit) 98 %    Oxygen Saturation (Exercise) 95 %    Oxygen Saturation (Exit) 97 %    Rating of Perceived Exertion (Exercise) 15    Perceived Dyspnea (Exercise) 3    Duration Continue with 30 min of aerobic exercise without signs/symptoms of physical distress.    Intensity THRR unchanged      Resistance Training   Training Prescription Yes    Weight blue bands    Reps 10-15    Time 10 Minutes       Recumbant Bike   Level 4    Minutes 15    METs 3      Recumbant Elliptical   Level 4    Minutes 15    METs 2             Social History   Tobacco Use  Smoking Status Never  Smokeless Tobacco Never    Goals Met:  Independence with exercise equipment Exercise tolerated well No report of concerns or symptoms today Strength training completed today  Goals Unmet:  Not Applicable  Comments: Service time is from 1026 to 1142    Dr. Mechele Collin is Medical Director for Pulmonary Rehab at [facility].

## 2023-02-10 ENCOUNTER — Telehealth: Payer: Self-pay

## 2023-02-10 ENCOUNTER — Encounter (HOSPITAL_COMMUNITY)
Admission: RE | Admit: 2023-02-10 | Discharge: 2023-02-10 | Disposition: A | Discharge status: Home / Self Care | Payer: No Typology Code available for payment source | Source: Ambulatory Visit | Attending: Pulmonary Disease | Admitting: Pulmonary Disease

## 2023-02-10 DIAGNOSIS — J449 Chronic obstructive pulmonary disease, unspecified: Secondary | ICD-10-CM | POA: Diagnosis not present

## 2023-02-10 NOTE — Progress Notes (Signed)
Daily Session Note  Patient Details  Name: LOUIS IVERY MRN: [id number] Date of Birth: [birth year] Referring Provider:   [name] Pulmonary Rehab Walk Test from 01/24/2023 in [facility]  Referring Provider [name]       Encounter Date: 02/10/2023  Check In:  Session Check In - 02/10/23 1030       Check-In   Supervising physician immediately available to respond to emergencies [facility] MD immediately available    Physician(s) Robin Searing, NP    [facility]    Staff Present Samantha Belarus, RD, Dutch Gray, RN, BSN;Randi Idelle Crouch BS, ACSM-CEP, Exercise Physiologist;Avedis Bevis Earlene Plater, MS, ACSM-CEP, Exercise Physiologist;Casey Katrinka Blazing, RT    Virtual Visit No    Medication changes reported     No    Fall or balance concerns reported    No    Tobacco Cessation No Change    Warm-up and Cool-down Performed as group-led instruction    Resistance Training Performed Yes    VAD Patient? No    PAD/SET Patient? No      Pain Assessment   Currently in Pain? No/denies    Multiple Pain Sites No             Capillary Blood Glucose: No results found for this or any previous visit (from the past 24 hour(s)).    Social History   Tobacco Use  Smoking Status Never  Smokeless Tobacco Never    Goals Met:  Proper associated with RPD/PD & O2 Sat Exercise tolerated well No report of concerns or symptoms today Strength training completed today  Goals Unmet:  Not Applicable  Comments: Service time is from 1020 to 1152.    Dr. Mechele Collin is Medical Director for Pulmonary Rehab at [facility].

## 2023-02-10 NOTE — Telephone Encounter (Signed)
[facility]@ Pulm/cardiac rehab pre exercise bp 160/58, 172/50, 134/52, during exercise  220/72, 190's /60, each session he can't push himself. Looking for approval for heart rate to be 130-140 while exercising, patient is taking is bp meds, ST recommended hydralazine 100mg  TID but also he does not follow him for his BP, nurse will reach out to PCP at the [facility] hospital

## 2023-02-11 ENCOUNTER — Telehealth (HOSPITAL_COMMUNITY): Payer: Self-pay

## 2023-02-11 NOTE — Telephone Encounter (Signed)
Placed call to the [place] regarding pt's BP, pre & during exercise at [facility]. LVM with nurse for a return call. Also asked for a THR increase. Awaiting call back.

## 2023-02-15 ENCOUNTER — Encounter (HOSPITAL_COMMUNITY)
Admission: RE | Admit: 2023-02-15 | Discharge: 2023-02-15 | Disposition: A | Discharge status: Home / Self Care | Payer: No Typology Code available for payment source | Source: Ambulatory Visit | Attending: Pulmonary Disease | Admitting: Pulmonary Disease

## 2023-02-15 VITALS — Wt 244.3 lb

## 2023-02-15 DIAGNOSIS — J449 Chronic obstructive pulmonary disease, unspecified: Secondary | ICD-10-CM | POA: Diagnosis not present

## 2023-02-15 NOTE — Progress Notes (Signed)
Daily Session Note  Patient Details  Name: [name] MRN: [id number] Date of Birth: 03-12-1951 Referring Provider:   [name] Pulmonary Rehab Walk Test from 01/24/2023 in [facility] for Heart, Vascular, & Lung Health  Referring Provider [name]       Encounter Date: 02/15/2023  Check In:  Session Check In - 02/15/23 1125       Check-In   Supervising physician immediately available to respond to emergencies [facility] MD immediately available    Physician(s) Robin Searing, NP    [facility]    Staff Present Samantha Belarus, RD, Dutch Gray, RN, BSN;Randi Reeve BS, ACSM-CEP, Exercise Physiologist;Giovannie Scerbo Earlene Plater, MS, ACSM-CEP, Exercise Physiologist    Virtual Visit No    Medication changes reported     No    Fall or balance concerns reported    No    Tobacco Cessation No Change    Warm-up and Cool-down Performed as group-led instruction    Resistance Training Performed Yes    VAD Patient? No    PAD/SET Patient? No      Pain Assessment   Currently in Pain? No/denies             Capillary Blood Glucose: No results found for this or any previous visit (from the past 24 hour(s)).    Social History   Tobacco Use  Smoking Status Never  Smokeless Tobacco Never    Goals Met:  Proper associated with RPD/PD & O2 Sat Exercise tolerated well No report of concerns or symptoms today Strength training completed today  Goals Unmet:  Not Applicable  Comments: Service time is from 1016 to 1143.    Dr. Mechele Collin is Medical Director for Pulmonary Rehab at [facility].

## 2023-02-16 ENCOUNTER — Telehealth: Payer: Self-pay | Admitting: Pulmonary Disease

## 2023-02-18 ENCOUNTER — Telehealth: Payer: Self-pay | Admitting: Pulmonary Disease

## 2023-02-18 ENCOUNTER — Other Ambulatory Visit: Payer: Self-pay | Admitting: Pulmonary Disease

## 2023-02-18 MED ORDER — AZITHROMYCIN 250 MG PO TABS: ORAL_TABLET | ORAL | 0 refills | Status: DC

## 2023-02-18 MED ORDER — PREDNISONE 20 MG PO TABS: 40.0000 mg | ORAL_TABLET | Freq: Every day | ORAL | 0 refills | Status: AC

## 2023-02-18 NOTE — Telephone Encounter (Signed)
Pt. Wife called back and let her know the meds were sent to pharmacy

## 2023-02-18 NOTE — Telephone Encounter (Signed)
Prescription for prednisone and azithromycin sent to pharmacy 

## 2023-02-18 NOTE — Telephone Encounter (Signed)
ATC X1 LVM  Please advise antibiotic and prednisone have been sent to pharmacy

## 2023-02-18 NOTE — Telephone Encounter (Signed)
Pt. Wife calling back and needs med advise

## 2023-02-18 NOTE — Telephone Encounter (Signed)
[place]

## 2023-02-18 NOTE — Telephone Encounter (Signed)
Called and spoke with patient's wife Massie Bougie. She stated that she believes the patient picked up a cold from their grandson on 02/12/23. He has a productive cough with thick green phlegm. He woke up with a fever this morning of 24F. Increased SOB and wheezing. He does have a runny nose but his nasal discharge has been clear. Denied having a sore throat.   She confirmed that he has been using his Wixela and Spiriva inhalers daily as well as the albuterol inhaler and nebulizer as needed.   She wanted to know if AO would be willing to send something in for him.   Pharmacy is [facility] on [address].   AO, can you please advise? Thanks!

## 2023-02-18 NOTE — Telephone Encounter (Signed)
PT called in on the after hours line. Notes state: He has COPD. A lot of congestion and low grade temp of 99 deg.  Pls call @ [phone]

## 2023-02-22 ENCOUNTER — Encounter (HOSPITAL_COMMUNITY): Payer: No Typology Code available for payment source

## 2023-02-23 NOTE — Progress Notes (Signed)
Pulmonary Individual Treatment Plan  Patient Details  Name: [name] MRN: [id number] Date of Birth: 09-Dec-1950 Referring Provider:   [name] Pulmonary Rehab Walk Test from 01/24/2023 in [facility]  Referring Provider [name]       Initial Encounter Date:  Flowsheet Row Pulmonary Rehab Walk Test from 01/24/2023 in [facility]  Date 01/24/23       Visit Diagnosis: Stage 2 moderate COPD by GOLD classification (HCC)  Patient's Home Medications on Admission:   Current Outpatient Medications:    albuterol (PROVENTIL) (2.5 MG/3ML) 0.083% nebulizer solution, Take 3 mLs (2.5 mg total) by nebulization every 4 (four) hours as needed for wheezing or shortness of breath., Disp: 1080 mL, Rfl: 2   albuterol (VENTOLIN HFA) 108 (90 Base) MCG/ACT inhaler, Inhale 2 puffs into the lungs every 6 (six) hours as needed for wheezing or shortness of breath., Disp: 8 g, Rfl: 5   amLODipine (NORVASC) 10 MG tablet, Take 10 mg by mouth daily., Disp: , Rfl:    aspirin EC 81 MG tablet, Take 81 mg by mouth daily. Swallow whole., Disp: , Rfl:    azithromycin (ZITHROMAX Z-PAK) 250 MG tablet, Take 2 tablets day 1 and then 1 daily for 4 days, Disp: 6 each, Rfl: 0   b complex vitamins capsule, Take 1 capsule by mouth daily., Disp: , Rfl:    buPROPion (WELLBUTRIN SR) 150 MG 12 hr tablet, Take 150 mg by mouth daily., Disp: , Rfl:    carbamide peroxide (DEBROX) 6.5 % OTIC solution, Place 5 drops into both ears as needed (ear wax)., Disp: , Rfl:    Cholecalciferol (VITAMIN D3) 1000 UNITS CAPS, Take 1,000 Units by mouth every morning., Disp: , Rfl:    empagliflozin (JARDIANCE) 25 MG TABS tablet, Take 25 mg by mouth daily. Take 1/2 tablet every morning, Disp: , Rfl:    FERGON 240 (27 Fe) MG tablet, Take 240 mg by mouth daily., Disp: , Rfl:    fexofenadine (ALLEGRA) 180 MG tablet, Take 180 mg by  mouth daily., Disp: , Rfl:    fluticasone (FLONASE) 50 MCG/ACT nasal spray, Place 1 spray into both nostrils in the morning and at bedtime., Disp: , Rfl:    fluticasone-salmeterol (WIXELA INHUB) 250-50 MCG/ACT AEPB, Inhale 1 puff into the lungs in the morning and at bedtime., Disp: 1 each, Rfl: 5   gabapentin (NEURONTIN) 400 MG capsule, Take 400 mg by mouth 3 (three) times daily., Disp: , Rfl:    hydrALAZINE (APRESOLINE) 50 MG tablet, Take 50 mg by mouth 3 (three) times daily., Disp: , Rfl:    hydrOXYzine (ATARAX/VISTARIL) 10 MG tablet, Take 20 mg by mouth at bedtime., Disp: , Rfl:    losartan (COZAAR) 100 MG tablet, Take 100 mg by mouth every evening., Disp: , Rfl:    melatonin 3 MG TABS tablet, Take 3 mg by mouth at bedtime., Disp: , Rfl:    metFORMIN (GLUCOPHAGE) 500 MG tablet, Take 500 mg by mouth daily with breakfast., Disp: , Rfl:    modafinil (PROVIGIL) 100 MG tablet, Take 200 mg (2 tablets) in the morning and 100 mg (one tablet) at noon as directed, Disp: 90 tablet, Rfl: 2   NON FORMULARY, BIPAP at bedtime, Disp: , Rfl:    omeprazole (PRILOSEC OTC) 20 MG tablet, Take 1 tablet (20 mg total) by mouth daily., Disp: 30 tablet, Rfl: 2   PARoxetine (PAXIL)  40 MG tablet, Take 40 mg by mouth at bedtime., Disp: , Rfl:    pravastatin (PRAVACHOL) 40 MG tablet, Take 40 mg by mouth at bedtime., Disp: , Rfl:    predniSONE (DELTASONE) 20 MG tablet, Take 2 tablets (40 mg total) by mouth daily with breakfast for 5 days., Disp: 10 tablet, Rfl: 0   Probiotic Product (PROBIOTIC DAILY PO), Take 1 capsule by mouth daily., Disp: , Rfl:    QUEtiapine (SEROQUEL) 25 MG tablet, Take 50 mg by mouth at bedtime., Disp: , Rfl:    spironolactone (ALDACTONE) 25 MG tablet, Take 50 mg by mouth daily. , Disp: , Rfl:    tamsulosin (FLOMAX) 0.4 MG CAPS capsule, Take 0.4 mg by mouth every morning., Disp: , Rfl:    Tiotropium Bromide Monohydrate (SPIRIVA RESPIMAT) 2.5 MCG/ACT AERS, Inhale 2 puffs into the lungs daily., Disp: 1  g, Rfl: 5   triamcinolone ointment (KENALOG) 0.1 %, Apply 1 application  topically daily as needed (rash)., Disp: , Rfl:    Turmeric 400 MG CAPS, Take 400 mg by mouth daily., Disp: , Rfl:    vitamin E 400 UNIT capsule, Take 400 Units by mouth daily., Disp: , Rfl:   Past Medical History: Past Medical History:  Diagnosis Date   Abnormal CT scan of lung 07/23/2021   Abnormality of gait    Allergic rhinitis due to pollen 11/25/2010   Bilateral primary osteoarthritis of hip 12/12/2019   Carpal tunnel syndrome on right    Contact with and (suspected) exposure to asbestos 07/23/2021   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   Hyperlipidemia    Hypothyroidism    Klippel-Feil sequence    Learning disability, unspecified 1997   Reading/reading comprehension   Left inguinal hernia    Low back pain 12/12/2019   Lumbar spondylosis    Major depressive disorder    Mild cognitive impairment 10/18/2022   Nonspecific abnormal electrocardiogram (ECG) (EKG)    Obstructive sleep apnea 12/15/2015   Osteoarthritis    Osteoporosis    Pain in joints of right hand    Peripheral neuropathy    Peripheral venous insufficiency    Pituitary abnormality    pituitary edema; takes bromocriptine   PTSD (post-traumatic stress disorder) 11/25/2010   Spinal stenosis of lumbar region 09/14/2018   Type 2 diabetes mellitus without complications 11/25/2010   Vitamin D deficiency    Weakness of left leg    Wears glasses     Tobacco Use: Social History   Tobacco Use   Smoking Status Never  Smokeless Tobacco Never    Labs: Review Flowsheet       Latest Ref Rng & Units 08/06/2008 07/18/2010 09/05/2012  Labs for ITP Cardiac and Pulmonary Rehab  Hemoglobin A1c 4.0 - 6.0 % 6.7 (NOTE)   The ADA recommends the following therapeutic goal for glycemic   control related to Hgb A1C measurement:   Goal of Therapy:   < 7.0% Hgb A1C   Reference: American Diabetes Association: Clinical Practice   Recommendations 2008, Diabetes Care,  2008, 31:(Suppl  1).  - 6.3   TCO2 0 - 100 mmol/L - 30  -    Capillary Blood Glucose: Lab Results  Component Value Date   GLUCAP 74 02/01/2023   GLUCAP 120 (H) 10/12/2022   GLUCAP 114 (H) 10/12/2022   GLUCAP 108 (H) 10/12/2022   GLUCAP 134 (H) 09/06/2019    POCT Glucose     Row Name 01/24/23 1156             POCT Blood Glucose   Pre-Exercise 157 mg/dL                Pulmonary Assessment Scores:  Pulmonary Assessment Scores     Row Name 01/24/23 1153 02/01/23 1122       ADL UCSD   ADL Phase Entry --    SOB Score total 56 --      CAT Score   CAT Score 22 --      mMRC Score   mMRC Score 3 --            UCSD: Self-administered rating of dyspnea associated with activities of daily living (ADLs) 6-point scale (0 = "not at all" to 5 = "maximal or unable to do because of breathlessness")  Scoring Scores range from 0 to 120.  Minimally important difference is 5 units  CAT: CAT can identify the health impairment of COPD patients and is better correlated with disease progression.  CAT has a scoring range of zero to 40. The CAT score is classified into four groups of low (less than 10), medium (10 - 20), high (21-30) and very high (31-40) based on the impact level of disease on health status. A CAT score over 10 suggests significant symptoms.  A worsening CAT score could be explained by an exacerbation, poor medication adherence, poor inhaler technique, or progression of COPD or comorbid conditions.  CAT  MCID is 2 points  mMRC: mMRC (Modified Medical Research Council) Dyspnea Scale is used to assess the degree of baseline functional disability in patients of respiratory disease due to dyspnea. No minimal important difference is established. A decrease in score of 1 point or greater is considered a positive change.   Pulmonary Function Assessment:  Pulmonary Function Assessment - 01/24/23 1213       Breath   Bilateral Breath Sounds Clear    Shortness of Breath Yes             Exercise Target Goals: Exercise Program Goal: Individual exercise prescription set using results from initial 6 min walk test and THRR while considering  patient's activity barriers and safety.   Exercise Prescription Goal: Initial exercise prescription builds to 30-45 minutes a day of aerobic activity, 2-3 days per week.  Home exercise guidelines will be given to patient during program as part of exercise prescription that the participant will acknowledge.  Activity Barriers & Risk Stratification:  Activity Barriers & Cardiac Risk Stratification - 01/24/23 1135       Activity Barriers & Cardiac Risk Stratification   Activity Barriers Muscular Weakness;Arthritis;Deconditioning;Shortness of Breath;Left Knee Replacement;Right Knee Replacement;Right Hip Replacement;Back Problems;Neck/Spine Problems;Balance Concerns    Cardiac Risk Stratification Moderate             6 Minute Walk:  6 Minute Walk     Row Name 01/26/23 0839         6 Minute Walk   Phase Initial     Distance 735 feet     Walk Time 6 minutes     #  of Rest Breaks 0     MPH 1.39     METS 2.02     RPE 11     Perceived Dyspnea  1     VO2 Peak 7.05     Symptoms No     Resting HR 58 bpm     Resting BP 164/50     Resting Oxygen Saturation  98 %     Exercise Oxygen Saturation  during 6 min walk 96 %     Max Ex. HR 112 bpm     Max Ex. BP 166/58     2 Minute Post BP 158/58       Interval HR   1 Minute HR 103     2 Minute HR 107      3 Minute HR 107     4 Minute HR 108     5 Minute HR 109     6 Minute HR 112     2 Minute Post HR 65     Interval Heart Rate? Yes       Interval Oxygen   Interval Oxygen? Yes     Baseline Oxygen Saturation % 98 %     1 Minute Oxygen Saturation % 96 %     1 Minute Liters of Oxygen 0 L     2 Minute Oxygen Saturation % 96 %     2 Minute Liters of Oxygen 0 L     3 Minute Oxygen Saturation % 97 %     3 Minute Liters of Oxygen 0 L     4 Minute Oxygen Saturation % 96 %     4 Minute Liters of Oxygen 0 L     5 Minute Oxygen Saturation % 97 %     5 Minute Liters of Oxygen 0 L     6 Minute Oxygen Saturation % 97 %     6 Minute Liters of Oxygen 0 L     2 Minute Post Oxygen Saturation % 98 %     2 Minute Post Liters of Oxygen 0 L              Oxygen Initial Assessment:  Oxygen Initial Assessment - 01/24/23 1137       Home Oxygen   Home Oxygen Device None    Sleep Oxygen Prescription BiPAP    Home Exercise Oxygen Prescription None    Home Resting Oxygen Prescription None      Initial 6 min Walk   Oxygen Used None      Program Oxygen Prescription   Program Oxygen Prescription None      Intervention   Short Term Goals To learn and exhibit compliance with exercise, home and travel O2 prescription;To learn and understand importance of maintaining oxygen saturations>88%;To learn and demonstrate proper use of respiratory medications;To learn and understand importance of monitoring SPO2 with pulse oximeter and demonstrate accurate use of the pulse oximeter.;To learn and demonstrate proper pursed lip breathing techniques or other breathing techniques.     Long  Term Goals Exhibits compliance with exercise, home  and travel O2 prescription;Maintenance of O2 saturations>88%;Compliance with respiratory medication;Verbalizes importance of monitoring SPO2 with pulse oximeter and return demonstration;Exhibits proper breathing techniques, such as pursed lip breathing or other method taught  during program session;Demonstrates proper use of MDI's             Oxygen Re-Evaluation:  Oxygen Re-Evaluation     Row Name 01/25/23 0753 02/18/23 0954  Program Oxygen Prescription   Program Oxygen Prescription None None        Home Oxygen   Home Oxygen Device None None      Sleep Oxygen Prescription BiPAP BiPAP      Home Exercise Oxygen Prescription None None      Home Resting Oxygen Prescription None None        Goals/Expected Outcomes   Short Term Goals To learn and exhibit compliance with exercise, home and travel O2 prescription;To learn and understand importance of maintaining oxygen saturations>88%;To learn and demonstrate proper use of respiratory medications;To learn and understand importance of monitoring SPO2 with pulse oximeter and demonstrate accurate use of the pulse oximeter.;To learn and demonstrate proper pursed lip breathing techniques or other breathing techniques.  To learn and exhibit compliance with exercise, home and travel O2 prescription;To learn and understand importance of maintaining oxygen saturations>88%;To learn and demonstrate proper use of respiratory medications;To learn and understand importance of monitoring SPO2 with pulse oximeter and demonstrate accurate use of the pulse oximeter.;To learn and demonstrate proper pursed lip breathing techniques or other breathing techniques.       Long  Term Goals Exhibits compliance with exercise, home  and travel O2 prescription;Maintenance of O2 saturations>88%;Compliance with respiratory medication;Verbalizes importance of monitoring SPO2 with pulse oximeter and return demonstration;Exhibits proper breathing techniques, such as pursed lip breathing or other method taught during program session;Demonstrates proper use of MDI's Exhibits compliance with exercise, home  and travel O2 prescription;Maintenance of O2 saturations>88%;Compliance with respiratory medication;Verbalizes importance of monitoring SPO2  with pulse oximeter and return demonstration;Exhibits proper breathing techniques, such as pursed lip breathing or other method taught during program session;Demonstrates proper use of MDI's      Goals/Expected Outcomes Compliance and understanding of oxygen saturation monitoring and breathing techniques to decrease shortness of breath. Compliance and understanding of oxygen saturation monitoring and breathing techniques to decrease shortness of breath.               Oxygen Discharge (Final Oxygen Re-Evaluation):  Oxygen Re-Evaluation - 02/18/23 0954       Program Oxygen Prescription   Program Oxygen Prescription None      Home Oxygen   Home Oxygen Device None    Sleep Oxygen Prescription BiPAP    Home Exercise Oxygen Prescription None    Home Resting Oxygen Prescription None      Goals/Expected Outcomes   Short Term Goals To learn and exhibit compliance with exercise, home and travel O2 prescription;To learn and understand importance of maintaining oxygen saturations>88%;To learn and demonstrate proper use of respiratory medications;To learn and understand importance of monitoring SPO2 with pulse oximeter and demonstrate accurate use of the pulse oximeter.;To learn and demonstrate proper pursed lip breathing techniques or other breathing techniques.     Long  Term Goals Exhibits compliance with exercise, home  and travel O2 prescription;Maintenance of O2 saturations>88%;Compliance with respiratory medication;Verbalizes importance of monitoring SPO2 with pulse oximeter and return demonstration;Exhibits proper breathing techniques, such as pursed lip breathing or other method taught during program session;Demonstrates proper use of MDI's    Goals/Expected Outcomes Compliance and understanding of oxygen saturation monitoring and breathing techniques to decrease shortness of breath.             Initial Exercise Prescription:  Initial Exercise Prescription - 01/24/23 1200       Date  of Initial Exercise RX and Referring Provider   Date 01/24/23    Referring Provider [name]    Expected Discharge Date 04/21/23  Recumbant Bike   Level 1    RPM 60    Minutes 15    METs 2      Recumbant Elliptical   Level 1    RPM 60    Minutes 15    METs 2      Prescription Details   Frequency (times per week) 2    Duration Progress to 30 minutes of continuous aerobic without signs/symptoms of physical distress      Intensity   THRR 40-80% of Max Heartrate 57-114    Ratings of Perceived Exertion 11-13    Perceived Dyspnea 0-4      Progression   Progression Continue progressive overload as per policy without signs/symptoms or physical distress.      Resistance Training   Training Prescription Yes    Weight blue bands    Reps 10-15             Perform Capillary Blood Glucose checks as needed.  Exercise Prescription Changes:   Exercise Prescription Changes     Row Name 02/08/23 1200 02/15/23 1217 02/22/23 1200         Response to Exercise   Blood Pressure (Admit) 172/50 144/60 --     Blood Pressure (Exercise) 198/64 182/68 --     Blood Pressure (Exit) 142/58 140/50 --     Heart Rate (Admit) 90 bpm 92 bpm --     Heart Rate (Exercise) 124 bpm 121 bpm --     Heart Rate (Exit) 96 bpm 95 bpm --     Oxygen Saturation (Admit) 98 % 98 % --     Oxygen Saturation (Exercise) 95 % 96 % --     Oxygen Saturation (Exit) 97 % 97 % --     Rating of Perceived Exertion (Exercise) 15 12 --     Perceived Dyspnea (Exercise) 3 2 --     Duration Continue with 30 min of aerobic exercise without signs/symptoms of physical distress. Continue with 30 min of aerobic exercise without signs/symptoms of physical distress. --     Intensity THRR unchanged THRR unchanged --       Progression   Average METs -- 2.6 --       Resistance Training   Training Prescription Yes Yes --     Weight blue bands blue bands --     Reps 10-15 10-15 --     Time 10 Minutes 10 Minutes --        Recumbant Bike   Level 4 4 --     RPM -- 60 --     Minutes 15 15 --     METs 3 2.8 --       Recumbant Elliptical   Level 4 4 --     Minutes 15 15 --     METs 2 2.4 --              Exercise Comments:   Exercise Comments     Row Name 02/01/23 1127           Exercise Comments Pt completed first day of group exercise. Pt exercised on the recumbent bike, level 3, for 15 min, METs 2.3. He then exercised on the recumbent elliptical for 15 min, level 4, METs 1.8. His BP and HR were elevated therefore had to slow down. Pt performed warm up and cool down without limitation, including squats. Discussed METs with good reception.  Exercise Goals and Review:   Exercise Goals     Row Name 01/24/23 1137 01/25/23 0751 02/18/23 0949         Exercise Goals   Increase Physical Activity Yes Yes Yes     Intervention Provide advice, education, support and counseling about physical activity/exercise needs.;Develop an individualized exercise prescription for aerobic and resistive training based on initial evaluation findings, risk stratification, comorbidities and participant's personal goals. Provide advice, education, support and counseling about physical activity/exercise needs.;Develop an individualized exercise prescription for aerobic and resistive training based on initial evaluation findings, risk stratification, comorbidities and participant's personal goals. Provide advice, education, support and counseling about physical activity/exercise needs.;Develop an individualized exercise prescription for aerobic and resistive training based on initial evaluation findings, risk stratification, comorbidities and participant's personal goals.     Expected Outcomes Short Term: Attend rehab on a regular basis to increase amount of physical activity.;Long Term: Exercising regularly at least 3-5 days a week.;Long Term: Add in home exercise to make exercise part of routine and to  increase amount of physical activity. Short Term: Attend rehab on a regular basis to increase amount of physical activity.;Long Term: Exercising regularly at least 3-5 days a week.;Long Term: Add in home exercise to make exercise part of routine and to increase amount of physical activity. Short Term: Attend rehab on a regular basis to increase amount of physical activity.;Long Term: Exercising regularly at least 3-5 days a week.;Long Term: Add in home exercise to make exercise part of routine and to increase amount of physical activity.     Increase Strength and Stamina Yes Yes Yes     Intervention Provide advice, education, support and counseling about physical activity/exercise needs.;Develop an individualized exercise prescription for aerobic and resistive training based on initial evaluation findings, risk stratification, comorbidities and participant's personal goals. Provide advice, education, support and counseling about physical activity/exercise needs.;Develop an individualized exercise prescription for aerobic and resistive training based on initial evaluation findings, risk stratification, comorbidities and participant's personal goals. Provide advice, education, support and counseling about physical activity/exercise needs.;Develop an individualized exercise prescription for aerobic and resistive training based on initial evaluation findings, risk stratification, comorbidities and participant's personal goals.     Expected Outcomes Short Term: Increase workloads from initial exercise prescription for resistance, speed, and METs.;Short Term: Perform resistance training exercises routinely during rehab and add in resistance training at home;Long Term: Improve cardiorespiratory fitness, muscular endurance and strength as measured by increased METs and functional capacity ( ) Short Term: Increase workloads from initial exercise prescription for resistance, speed, and METs.;Short Term: Perform  resistance training exercises routinely during rehab and add in resistance training at home;Long Term: Improve cardiorespiratory fitness, muscular endurance and strength as measured by increased METs and functional capacity ( ) Short Term: Increase workloads from initial exercise prescription for resistance, speed, and METs.;Short Term: Perform resistance training exercises routinely during rehab and add in resistance training at home;Long Term: Improve cardiorespiratory fitness, muscular endurance and strength as measured by increased METs and functional capacity ( )     Able to understand and use rate of perceived exertion (RPE) scale Yes Yes Yes     Intervention Provide education and explanation on how to use RPE scale Provide education and explanation on how to use RPE scale Provide education and explanation on how to use RPE scale     Expected Outcomes Short Term: Able to use RPE daily in rehab to express subjective intensity level;Long Term:  Able to use RPE to guide intensity level  when exercising independently Short Term: Able to use RPE daily in rehab to express subjective intensity level;Long Term:  Able to use RPE to guide intensity level when exercising independently Short Term: Able to use RPE daily in rehab to express subjective intensity level;Long Term:  Able to use RPE to guide intensity level when exercising independently     Able to understand and use Dyspnea scale Yes Yes Yes     Intervention Provide education and explanation on how to use Dyspnea scale Provide education and explanation on how to use Dyspnea scale Provide education and explanation on how to use Dyspnea scale     Expected Outcomes Short Term: Able to use Dyspnea scale daily in rehab to express subjective sense of shortness of breath during exertion;Long Term: Able to use Dyspnea scale to guide intensity level when exercising independently Short Term: Able to use Dyspnea scale daily in rehab to express subjective sense  of shortness of breath during exertion;Long Term: Able to use Dyspnea scale to guide intensity level when exercising independently Short Term: Able to use Dyspnea scale daily in rehab to express subjective sense of shortness of breath during exertion;Long Term: Able to use Dyspnea scale to guide intensity level when exercising independently     Knowledge and understanding of Target Heart Rate Range (THRR) Yes Yes Yes     Intervention Provide education and explanation of THRR including how the numbers were predicted and where they are located for reference Provide education and explanation of THRR including how the numbers were predicted and where they are located for reference Provide education and explanation of THRR including how the numbers were predicted and where they are located for reference     Expected Outcomes Short Term: Able to state/look up THRR;Long Term: Able to use THRR to govern intensity when exercising independently;Short Term: Able to use daily as guideline for intensity in rehab Short Term: Able to state/look up THRR;Long Term: Able to use THRR to govern intensity when exercising independently;Short Term: Able to use daily as guideline for intensity in rehab Short Term: Able to state/look up THRR;Long Term: Able to use THRR to govern intensity when exercising independently;Short Term: Able to use daily as guideline for intensity in rehab     Understanding of Exercise Prescription Yes Yes Yes     Intervention Provide education, explanation, and written materials on patient's individual exercise prescription Provide education, explanation, and written materials on patient's individual exercise prescription Provide education, explanation, and written materials on patient's individual exercise prescription     Expected Outcomes Short Term: Able to explain program exercise prescription;Long Term: Able to explain home exercise prescription to exercise independently Short Term: Able to explain  program exercise prescription;Long Term: Able to explain home exercise prescription to exercise independently Short Term: Able to explain program exercise prescription;Long Term: Able to explain home exercise prescription to exercise independently              Exercise Goals Re-Evaluation :  Exercise Goals Re-Evaluation     Row Name 01/25/23 0752 02/18/23 0949           Exercise Goal Re-Evaluation   Exercise Goals Review Increase Physical Activity;Able to understand and use Dyspnea scale;Understanding of Exercise Prescription;Increase Strength and Stamina;Knowledge and understanding of Target Heart Rate Range (THRR);Able to understand and use rate of perceived exertion (RPE) scale Increase Physical Activity;Able to understand and use Dyspnea scale;Understanding of Exercise Prescription;Increase Strength and Stamina;Knowledge and understanding of Target Heart Rate Range (THRR);Able to understand  and use rate of perceived exertion (RPE) scale      Comments Pt is scheduled to begin exercise next week. Will continue to monitor and progress as able. Alejos has completed 5 exercise sessions. He exercises for 15 min on the recumbent bike and recumbent elliptical. Pt averages 2.8 METs at level 4 on the recumbent bike and 2.4 MEts at level 4 on the recumbent elliptical. He performs the warmup and cooldown standing without limitations. Michio greatly increased his workload for both exercise modes on his first day. This caused an elevated BP and drop in his CBG. He was decreased then slowly increased.  He tolerated the slower increase in workload better. Kitai seems very motivated to exercise. Will continue to monitor and progress as able.      Expected Outcomes Through exercise at rehab and home, the patient will decrease shortness of breath with daily activities and feel confident in carrying out an exercise regimen at home. Through exercise at rehab and home, the patient will decrease shortness of breath  with daily activities and feel confident in carrying out an exercise regimen at home.               Discharge Exercise Prescription (Final Exercise Prescription Changes):  Exercise Prescription Changes - 02/22/23 1200       Response to Exercise   Blood Pressure (Admit) --    Blood Pressure (Exercise) --    Blood Pressure (Exit) --    Heart Rate (Admit) --    Heart Rate (Exercise) --    Heart Rate (Exit) --    Oxygen Saturation (Admit) --    Oxygen Saturation (Exercise) --    Oxygen Saturation (Exit) --    Rating of Perceived Exertion (Exercise) --    Perceived Dyspnea (Exercise) --    Duration --    Intensity --      Progression   Average METs --      Resistance Training   Training Prescription --    Weight --    Reps --    Time --      Recumbant Bike   Level --    RPM --    Minutes --    METs --      Recumbant Elliptical   Level --    Minutes --    METs --             Nutrition:  Target Goals: Understanding of nutrition guidelines, daily intake of sodium 1500mg , cholesterol 200mg , calories 30% from fat and 7% or less from saturated fats, daily to have 5 or more servings of fruits and vegetables.  Biometrics:  Pre Biometrics - 01/24/23 1258       Pre Biometrics   Grip Strength 28 kg              Nutrition Therapy Plan and Nutrition Goals:  Nutrition Therapy & Goals - 02/01/23 1141       Nutrition Therapy   Diet Heart Healthy Diet    Drug/Food Interactions Statins/Certain Fruits      Personal Nutrition Goals   Nutrition Goal Patient to improve diet quality by using the plate method as a guide for meal planning to include lean protein/plant protein, fruits, vegetables, whole grains, nonfat dairy as part of a well-balanced diet.    Personal Goal #2 Patient to reduce sodium intake to 1500mg  per day    Comments Jaivian presented today with elevated blood pressure (152/88); nursing asked him to bring in his  blood pressure log. His  wife, Massie Bougie, is also a patient in pulmonary rehab and she does the majority of the grocery shopping and cooking. Will continue to discuss reduction of sodium intake and increased high fiber/high potassium foods to support blood pressure control. They eat a wide variety of foods.Suhaan will benefit from participation in pulmonary rehab for nutrition, exercise, and lifestyle changes.      Intervention Plan   Intervention Prescribe, educate and counsel regarding individualized specific dietary modifications aiming towards targeted core components such as weight, hypertension, lipid management, diabetes, heart failure and other comorbidities.;Nutrition handout(s) given to patient.    Expected Outcomes Short Term Goal: Understand basic principles of dietary content, such as calories, fat, sodium, cholesterol and nutrients.;Long Term Goal: Adherence to prescribed nutrition plan.             Nutrition Assessments:  Nutrition Assessments - 02/01/23 1148       Rate Your Plate Scores   Pre Score 59            MEDIFICTS Score Key: ?70 Need to make dietary changes  40-70 Heart Healthy Diet ? 40 Therapeutic Level Cholesterol Diet  Flowsheet Row PULMONARY REHAB CHRONIC OBSTRUCTIVE PULMONARY DISEASE from 02/01/2023 in [facility]  Picture Your Plate Total Score on Admission 59      Picture Your Plate Scores: <16 Unhealthy dietary pattern with much room for improvement. 41-50 Dietary pattern unlikely to meet recommendations for good health and room for improvement. 51-60 More healthful dietary pattern, with some room for improvement.  >60 Healthy dietary pattern, although there may be some specific behaviors that could be improved.    Nutrition Goals Re-Evaluation:  Nutrition Goals Re-Evaluation     Row Name 02/01/23 1141             Goals   Current Weight 247 lb 12.8 oz (112.4 kg)       Comment A1c 6.7, lipids WNL, AST  35, ALT 34, GFR 43, CR 1.67       Expected Outcome Iseah presented today with elevated blood pressure (152/88); nursing asked him to bring in his blood pressure log. His wife, Massie Bougie, is also a patient in pulmonary rehab and she does the majority of the grocery shopping and cooking. Will continue to discuss reduction of sodium intake and increased high fiber/high potassium foods to support blood pressure control. They eat a wide variety of foods.Cleavon will benefit from participation in pulmonary rehab for nutrition, exercise, and lifestyle changes.                Nutrition Goals Discharge (Final Nutrition Goals Re-Evaluation):  Nutrition Goals Re-Evaluation - 02/01/23 1141       Goals   Current Weight 247 lb 12.8 oz (112.4 kg)    Comment A1c 6.7, lipids WNL, AST 35, ALT 34, GFR 43, CR 1.67    Expected Outcome Leandre presented today with elevated blood pressure (152/88); nursing asked him to bring in his blood pressure log. His wife, Massie Bougie, is also a patient in pulmonary rehab and she does the majority of the grocery shopping and cooking. Will continue to discuss reduction of sodium intake and increased high fiber/high potassium foods to support blood pressure control. They eat a wide variety of foods.Dodd will benefit from participation in pulmonary rehab for nutrition, exercise, and lifestyle changes.             Psychosocial: Target Goals: Acknowledge presence or absence  of significant depression and/or stress, maximize coping skills, provide positive support system. Participant is able to verbalize types and ability to use techniques and skills needed for reducing stress and depression.  Initial Review & Psychosocial Screening:  Initial Psych Review & Screening - 01/24/23 1128       Initial Review   Current issues with Current Depression;Current Psychotropic Meds;Current Anxiety/Panic;Current Stress Concerns;Current Sleep Concerns    Source of Stress Concerns Chronic  Illness;Family    Comments Pt's is currently under stress due to his brother being recently diagnosed with ALS and also his wife has multiple myeloma. Pt also has sleep concerns due to sleep apnea and narcolepsy. Pt does see a therapist and is on medications for depression at this time.      Family Dynamics   Good Support System? Yes      Barriers   Psychosocial barriers to participate in program The patient should benefit from training in stress management and relaxation.;Psychosocial barriers identified (see note)      Screening Interventions   Interventions Encouraged to exercise    Expected Outcomes Short Term goal: Utilizing psychosocial counselor, staff and physician to assist with identification of specific Stressors or current issues interfering with healing process. Setting desired goal for each stressor or current issue identified.;Long Term Goal: Stressors or current issues are controlled or eliminated.;Short Term goal: Identification and review with participant of any Quality of Life or Depression concerns found by scoring the questionnaire.;Long Term goal: The participant improves quality of Life and PHQ9 Scores as seen by post scores and/or verbalization of changes             Quality of Life Scores:  Scores of 19 and below usually indicate a poorer quality of life in these areas.  A difference of  2-3 points is a clinically meaningful difference.  A difference of 2-3 points in the total score of the Quality of Life Index has been associated with significant improvement in overall quality of life, self-image, physical symptoms, and general health in studies assessing change in quality of life.  PHQ-9: Review Flowsheet       01/24/2023  Depression screen PHQ 2/9  Decreased Interest 2  Down, Depressed, Hopeless 0  PHQ - 2 Score 2  Altered sleeping 3  Tired, decreased energy 2  Change in appetite 1  Feeling bad or failure about yourself  2  Trouble concentrating 3   Moving slowly or fidgety/restless 1  Suicidal thoughts 0  PHQ-9 Score 14  Difficult doing work/chores Somewhat difficult   Interpretation of Total Score  Total Score Depression Severity:  1-4 = Minimal depression, 5-9 = Mild depression, 10-14 = Moderate depression, 15-19 = Moderately severe depression, 20-27 = Severe depression   Psychosocial Evaluation and Intervention:  Psychosocial Evaluation - 01/24/23 1131       Psychosocial Evaluation & Interventions   Interventions Stress management education;Relaxation education;Encouraged to exercise with the program and follow exercise prescription    Comments Stillman is currently stressed over his brother's recent diagnosis of ALS. He's also dealing with his wifes diagnosis of multiple myeloma.    Expected Outcomes For Diesel to participate in PR free of any psychosocial barriers or concerns.    Continue Psychosocial Services  Follow up required by staff             Psychosocial Re-Evaluation:  Psychosocial Re-Evaluation     Row Name 01/24/23 1301 02/21/23 0933           Psychosocial Re-Evaluation  Current issues with Current Depression;Current Anxiety/Panic;Current Psychotropic Meds;Current Stress Concerns;Current Sleep Concerns Current Depression;Current Anxiety/Panic;Current Psychotropic Meds;Current Stress Concerns;Current Sleep Concerns      Comments No new psychosocial barriers or concerns since orientation on 5/10. Zakkery is still processing his brother's ALS diagnosis as well as his wife's multiple myeloma diagnosis. He is uncertain of the future and worries about his brother and his wife. Rob feels his mental health is stable and is compliant with taking his psychotropic medications. He also sees a therapist through the [place]. He states that if he needs mental health assistance, he can reach out to the [place]. Chancy's BP and HR has been elevated throughout the program. We have reached out to the [place] for assistance, and he has  followed up.      Expected Outcomes For pt to partcipate in PR free of any psychosocial barriers or concerns. For Lonn to continue to partcipate in PR free of any psychosocial barriers or concerns.      Interventions Encouraged to attend Pulmonary Rehabilitation for the exercise;Stress management education Encouraged to attend Pulmonary Rehabilitation for the exercise;Stress management education;Relaxation education      Continue Psychosocial Services  Follow up required by staff Follow up required by staff               Psychosocial Discharge (Final Psychosocial Re-Evaluation):  Psychosocial Re-Evaluation - 02/21/23 0933       Psychosocial Re-Evaluation   Current issues with Current Depression;Current Anxiety/Panic;Current Psychotropic Meds;Current Stress Concerns;Current Sleep Concerns    Comments Tukker is still processing his brother's ALS diagnosis as well as his wife's multiple myeloma diagnosis. He is uncertain of the future and worries about his brother and his wife. Aman feels his mental health is stable and is compliant with taking his psychotropic medications. He also sees a therapist through the [place]. He states that if he needs mental health assistance, he can reach out to the [place]. Amariyon's BP and HR has been elevated throughout the program. We have reached out to the [place] for assistance, and he has followed up.    Expected Outcomes For Yahir to continue to partcipate in PR free of any psychosocial barriers or concerns.    Interventions Encouraged to attend Pulmonary Rehabilitation for the exercise;Stress management education;Relaxation education    Continue Psychosocial Services  Follow up required by staff             Education: Education Goals: Education classes will be provided on a weekly basis, covering required topics. Participant will state understanding/return demonstration of topics presented.  Learning Barriers/Preferences:  Learning Barriers/Preferences -  01/24/23 1132       Learning Barriers/Preferences   Learning Barriers Sight;Reading   pt is dyslexic   Learning Preferences Skilled Demonstration             Education Topics: Introduction to Pulmonary Rehab Group instruction provided by PowerPoint, verbal discussion, and written material to support subject matter. Instructor reviews what Pulmonary Rehab is, the purpose of the program, and how patients are referred.     Know Your Numbers Group instruction that is supported by a PowerPoint presentation. Instructor discusses importance of knowing and understanding resting, exercise, and post-exercise oxygen saturation, heart rate, and blood pressure. Oxygen saturation, heart rate, blood pressure, rating of perceived exertion, and dyspnea are reviewed along with a normal range for these values.    Exercise for the Pulmonary Patient Group instruction that is supported by a PowerPoint presentation. Instructor discusses benefits of exercise, core components  of exercise, frequency, duration, and intensity of an exercise routine, importance of utilizing pulse oximetry during exercise, safety while exercising, and options of places to exercise outside of rehab.  Flowsheet Row PULMONARY REHAB CHRONIC OBSTRUCTIVE PULMONARY DISEASE from 02/10/2023 in [facility]  Date 02/10/23  Educator EP  Instruction Review Code 1- Verbalizes Understanding          MET Level  Group instruction provided by PowerPoint, verbal discussion, and written material to support subject matter. Instructor reviews what METs are and how to increase METs.    Pulmonary Medications Verbally interactive group education provided by instructor with focus on inhaled medications and proper administration. Flowsheet Row PULMONARY REHAB CHRONIC OBSTRUCTIVE PULMONARY DISEASE from 02/03/2023 in [facility]  Date  02/03/23  Educator RT  Instruction Review Code 1- Verbalizes Understanding       Anatomy and Physiology of the Respiratory System Group instruction provided by PowerPoint, verbal discussion, and written material to support subject matter. Instructor reviews respiratory cycle and anatomical components of the respiratory system and their functions. Instructor also reviews differences in obstructive and restrictive respiratory diseases with examples of each.    Oxygen Safety Group instruction provided by PowerPoint, verbal discussion, and written material to support subject matter. There is an overview of "What is Oxygen" and "Why do we need it".  Instructor also reviews how to create a safe environment for oxygen use, the importance of using oxygen as prescribed, and the risks of noncompliance. There is a brief discussion on traveling with oxygen and resources the patient may utilize.   Oxygen Use Group instruction provided by PowerPoint, verbal discussion, and written material to discuss how supplemental oxygen is prescribed and different types of oxygen supply systems. Resources for more information are provided.    Breathing Techniques Group instruction that is supported by demonstration and informational handouts. Instructor discusses the benefits of pursed lip and diaphragmatic breathing and detailed demonstration on how to perform both.     Risk Factor Reduction Group instruction that is supported by a PowerPoint presentation. Instructor discusses the definition of a risk factor, different risk factors for pulmonary disease, and how the heart and lungs work together.   MD Day A group question and answer session with a medical doctor that allows participants to ask questions that relate to their pulmonary disease state.   Nutrition for the Pulmonary Patient Group instruction provided by PowerPoint slides, verbal discussion, and written materials to support subject matter. The  instructor gives an explanation and review of healthy diet recommendations, which includes a discussion on weight management, recommendations for fruit and vegetable consumption, as well as protein, fluid, caffeine, fiber, sodium, sugar, and alcohol. Tips for eating when patients are short of breath are discussed.    Other Education Group or individual verbal, written, or video instructions that support the educational goals of the pulmonary rehab program.    Knowledge Questionnaire Score:  Knowledge Questionnaire Score - 02/01/23 1122       Knowledge Questionnaire Score   Post Score --             Core Components/Risk Factors/Patient Goals at Admission:  Personal Goals and Risk Factors at Admission - 01/24/23 1133       Core Components/Risk Factors/Patient Goals on Admission    Weight Management Weight Loss    Improve shortness of breath with ADL's Yes    Intervention Provide education,  individualized exercise plan and daily activity instruction to help decrease symptoms of SOB with activities of daily living.    Expected Outcomes Short Term: Improve cardiorespiratory fitness to achieve a reduction of symptoms when performing ADLs    Hypertension Yes    Intervention Provide education on lifestyle modifcations including regular physical activity/exercise, weight management, moderate sodium restriction and increased consumption of fresh fruit, vegetables, and low fat dairy, alcohol moderation, and smoking cessation.;Monitor prescription use compliance.    Expected Outcomes Short Term: Continued assessment and intervention until BP is < 140/27mm HG in hypertensive participants. < 130/50mm HG in hypertensive participants with diabetes, heart failure or chronic kidney disease.;Long Term: Maintenance of blood pressure at goal levels.    Stress Yes    Intervention Offer individual and/or small group education and counseling on adjustment to heart disease, stress management and  health-related lifestyle change. Teach and support self-help strategies.;Refer participants experiencing significant psychosocial distress to appropriate mental health specialists for further evaluation and treatment. When possible, include family members and significant others in education/counseling sessions.    Expected Outcomes Short Term: Participant demonstrates changes in health-related behavior, relaxation and other stress management skills, ability to obtain effective social support, and compliance with psychotropic medications if prescribed.;Long Term: Emotional wellbeing is indicated by absence of clinically significant psychosocial distress or social isolation.             Core Components/Risk Factors/Patient Goals Review:   Goals and Risk Factor Review     Row Name 01/24/23 1302 02/21/23 0945           Core Components/Risk Factors/Patient Goals Review   Personal Goals Review Weight Management/Obesity;Improve shortness of breath with ADL's;Develop more efficient breathing techniques such as purse lipped breathing and diaphragmatic breathing and practicing self-pacing with activity.;Hypertension Weight Management/Obesity;Improve shortness of breath with ADL's;Develop more efficient breathing techniques such as purse lipped breathing and diaphragmatic breathing and practicing self-pacing with activity.;Hypertension      Review Pt is scheduled to start PR on 6/18 Goal in progress for weight loss. Lou's wife is beneficial in assisting him to reduce his sodium intake and eat a wide variety of foods. His wife Massie Bougie, who is also in the program, have been working with our dietician to make changes. Goal progressing on improving his shortness of breath with ADLs. His oxygen saturation has maintained on room air. Goal progressing on developing more efficient breathing techniques such as purse lipped breathing and diaphragmatic breathing; and practicing self-pacing with activity. Danni  needs to be prompted with he becomes dyspneic. Goal progressing on blood pressure control. Tamim followed up with his PCP about his high BP and they are going to continue to watch it until his follow up again in 3 months. Abby is keeping a BP log to take to his doctor's appointment. Crystian tolerates coming to class. He gets discouraged when he can't put forth his full effort due to his high BP and/or HR. Cadden will continue to benefit from participation in PR for nutrition, education, exercise and lifestyle modification.      Expected Outcomes See admission goals See admission goals               Core Components/Risk Factors/Patient Goals at Discharge (Final Review):   Goals and Risk Factor Review - 02/21/23 0945       Core Components/Risk Factors/Patient Goals Review   Personal Goals Review Weight Management/Obesity;Improve shortness of breath with ADL's;Develop more efficient breathing techniques such as purse lipped breathing and diaphragmatic breathing  and practicing self-pacing with activity.;Hypertension    Review Goal in progress for weight loss. Ruxin's wife is beneficial in assisting him to reduce his sodium intake and eat a wide variety of foods. His wife Massie Bougie, who is also in the program, have been working with our dietician to make changes. Goal progressing on improving his shortness of breath with ADLs. His oxygen saturation has maintained on room air. Goal progressing on developing more efficient breathing techniques such as purse lipped breathing and diaphragmatic breathing; and practicing self-pacing with activity. Draedyn needs to be prompted with he becomes dyspneic. Goal progressing on blood pressure control. Jasman followed up with his PCP about his high BP and they are going to continue to watch it until his follow up again in 3 months. Ankur is keeping a BP log to take to his doctor's appointment. Dorothy tolerates coming to class. He gets discouraged when he can't put  forth his full effort due to his high BP and/or HR. Samuel will continue to benefit from participation in PR for nutrition, education, exercise and lifestyle modification.    Expected Outcomes See admission goals             ITP Comments: Pt is making expected progress toward Pulmonary Rehab goals after completing 5 sessions. Recommend continued exercise, life style modification, education, and utilization of breathing techniques to increase stamina and strength, while also decreasing shortness of breath with exertion.  Dr. Mechele Collin is Medical Director for Pulmonary Rehab at [facility].

## 2023-02-24 ENCOUNTER — Encounter (HOSPITAL_COMMUNITY): Payer: No Typology Code available for payment source

## 2023-02-25 ENCOUNTER — Telehealth (HOSPITAL_COMMUNITY): Payer: Self-pay

## 2023-02-25 NOTE — Telephone Encounter (Signed)
LVM for patient. Calling to see how patient is feeling after being ill. Calling to see when patient is coming back to [facility]. Awaiting call back.

## 2023-03-01 ENCOUNTER — Encounter (HOSPITAL_COMMUNITY)
Admission: RE | Admit: 2023-03-01 | Discharge: 2023-03-01 | Disposition: A | Discharge status: Home / Self Care | Payer: No Typology Code available for payment source | Source: Ambulatory Visit | Attending: Pulmonary Disease | Admitting: Pulmonary Disease

## 2023-03-01 DIAGNOSIS — J449 Chronic obstructive pulmonary disease, unspecified: Secondary | ICD-10-CM

## 2023-03-01 LAB — GLUCOSE, CAPILLARY
Glucose-Capillary: 196 mg/dL — ABNORMAL HIGH (ref 70–99)
Glucose-Capillary: 101 mg/dL — ABNORMAL HIGH (ref 70–99)

## 2023-03-01 NOTE — Progress Notes (Signed)
Daily Session Note  Patient Details  Name: [name] MRN: [id number] Date of Birth: 09-Mar-1951 Referring Provider:   Doristine Odonnell Pulmonary Rehab Walk Test from 01/24/2023 in [facility]  Referring Provider [name]       Encounter Date: 03/01/2023  Check In:  Session Check In - 03/01/23 1059       Check-In   Supervising physician immediately available to respond to emergencies [facility] MD immediately available    Physician(s) Tim Fabian, NP    Location [facility]    Staff Present [name], RD, Tim Gray, RN, BSN;[name] BS, ACSM-CEP, Exercise Physiologist;[name] Tim Plater, MS, ACSM-CEP, Exercise Physiologist;[name], RT    Virtual Visit No    Medication changes reported     No    Fall or balance concerns reported    No    Tobacco Cessation No Change    Warm-up and Cool-down Performed as group-led instruction    Resistance Training Performed Yes    VAD Patient? No    PAD/SET Patient? No      Pain Assessment   Currently in Pain? No/denies    Multiple Pain Sites No             Capillary Blood Glucose: Results for orders placed or performed during the hospital encounter of 02/15/23 (from the past 24 hour(s))  Glucose, capillary     Status: Abnormal   Collection Time: 03/01/23 10:19 AM  Result Value Ref Range   Glucose-Capillary 196 (H) 70 - 99 mg/dL      Social History   Tobacco Use  Smoking Status Never  Smokeless Tobacco Never    Goals Met:  Proper associated with RPD/PD & O2 Sat Exercise tolerated well No report of concerns or symptoms today Strength training completed today  Goals Unmet:  Not Applicable  Comments: Service time is from 1013 to 1134.    Dr. Mechele Odonnell is Medical Director for Pulmonary Rehab at [facility].

## 2023-03-02 ENCOUNTER — Encounter (HOSPITAL_COMMUNITY): Payer: Self-pay | Admitting: Internal Medicine

## 2023-03-02 ENCOUNTER — Other Ambulatory Visit (HOSPITAL_COMMUNITY): Payer: Self-pay | Admitting: Internal Medicine

## 2023-03-02 DIAGNOSIS — R609 Edema, unspecified: Secondary | ICD-10-CM

## 2023-03-03 ENCOUNTER — Ambulatory Visit (HOSPITAL_COMMUNITY)
Admission: RE | Admit: 2023-03-03 | Discharge: 2023-03-03 | Disposition: A | Discharge status: Home / Self Care | Payer: No Typology Code available for payment source | Source: Ambulatory Visit | Attending: Vascular Surgery | Admitting: Vascular Surgery

## 2023-03-03 ENCOUNTER — Encounter (HOSPITAL_COMMUNITY)
Admission: RE | Admit: 2023-03-03 | Discharge: 2023-03-03 | Disposition: A | Discharge status: Home / Self Care | Payer: No Typology Code available for payment source | Source: Ambulatory Visit | Attending: Pulmonary Disease | Admitting: Pulmonary Disease

## 2023-03-03 DIAGNOSIS — J449 Chronic obstructive pulmonary disease, unspecified: Secondary | ICD-10-CM

## 2023-03-03 DIAGNOSIS — R609 Edema, unspecified: Secondary | ICD-10-CM | POA: Insufficient documentation

## 2023-03-03 NOTE — Progress Notes (Signed)
Daily Session Note  Patient Details  Name: [name] MRN: [id number] Date of Birth: 04-15-1951 Referring Provider:   [name] Pulmonary Rehab Walk Test from 01/24/2023 in [facility]  Referring Provider [name]       Encounter Date: 03/03/2023  Check In:  Session Check In - 03/03/23 1028       Check-In   Supervising physician immediately available to respond to emergencies [facility] immediately available    Physician(s) Bernadene Person, NP    [facility]    Staff Present Samantha Belarus, RD, Dutch Gray, RN, BSN;Randi Reeve BS, ACSM-CEP, Exercise Physiologist;Kaylee Earlene Plater, MS, ACSM-CEP, Exercise Physiologist;Aylah Yeary Katrinka Blazing, RT    Virtual Visit No    Medication changes reported     No    Fall or balance concerns reported    No    Tobacco Cessation No Change    Warm-up and Cool-down Performed as group-led instruction    Resistance Training Performed Yes    VAD Patient? No    PAD/SET Patient? No      Pain Assessment   Currently in Pain? No/denies    Multiple Pain Sites No             Capillary Blood Glucose: No results found for this or any previous visit (from the past 24 hour(s)).    Social History   Tobacco Use  Smoking Status Never  Smokeless Tobacco Never    Goals Met:  Proper associated with RPD/PD & O2 Sat Independence with exercise equipment Exercise tolerated well No report of concerns or symptoms today Strength training completed today  Goals Unmet:  Not Applicable  Comments: Service time is from 1010 to 1144.    Dr. Mechele Collin is Medical Director for Pulmonary Rehab at [facility].

## 2023-03-08 ENCOUNTER — Encounter (HOSPITAL_COMMUNITY): Payer: Self-pay

## 2023-03-08 ENCOUNTER — Encounter (HOSPITAL_COMMUNITY)
Admission: RE | Admit: 2023-03-08 | Discharge: 2023-03-08 | Disposition: A | Discharge status: Home / Self Care | Payer: No Typology Code available for payment source | Source: Ambulatory Visit | Attending: Pulmonary Disease | Admitting: Pulmonary Disease

## 2023-03-08 VITALS — Wt 249.8 lb

## 2023-03-08 DIAGNOSIS — J449 Chronic obstructive pulmonary disease, unspecified: Secondary | ICD-10-CM | POA: Diagnosis not present

## 2023-03-08 LAB — GLUCOSE, CAPILLARY: Glucose-Capillary: 158 mg/dL — ABNORMAL HIGH (ref 70–99)

## 2023-03-08 NOTE — Progress Notes (Signed)
Daily Session Note  Patient Details  Name: [name] MRN: [id number] Date of Birth: Jan 08, 1951 Referring Provider:   [name] Pulmonary Rehab Walk Test from 01/24/2023 in [facility]  Referring Provider [name]       Encounter Date: 03/08/2023  Check In:  Session Check In - 03/08/23 1254       Check-In   Supervising physician immediately available to respond to emergencies [facility] immediately available    Physician(s) Bernadene Person, NP    [facility]    Staff Present Essie Hart, RN, Doris Cheadle, MS, ACSM-CEP, Exercise Physiologist;Casey Katrinka Blazing, RT    Virtual Visit No    Medication changes reported     No    Fall or balance concerns reported    No    Tobacco Cessation No Change    Warm-up and Cool-down Performed as group-led instruction    Resistance Training Performed Yes    VAD Patient? No      Pain Assessment   Currently in Pain? No/denies    Pain Score 0-No pain    Multiple Pain Sites No             Capillary Blood Glucose: Results for orders placed or performed during the hospital encounter of 03/03/23 (from the past 24 hour(s))  Glucose, capillary     Status: Abnormal   Collection Time: 03/08/23 10:33 AM  Result Value Ref Range   Glucose-Capillary 158 (H) 70 - 99 mg/dL     Exercise Prescription Changes - 03/08/23 1200       Response to Exercise   Blood Pressure (Admit) 154/58    Blood Pressure (Exercise) 170/60    Blood Pressure (Exit) 140/60    Heart Rate (Admit) 97 bpm    Heart Rate (Exercise) 121 bpm    Heart Rate (Exit) 98 bpm    Oxygen Saturation (Admit) 98 %    Oxygen Saturation (Exercise) 99 %    Oxygen Saturation (Exit) 998 %    Rating of Perceived Exertion (Exercise) 13    Perceived Dyspnea (Exercise) 2    Duration Continue with 30 min of aerobic exercise without signs/symptoms of physical distress.    Intensity THRR unchanged       Resistance Training   Training Prescription Yes    Weight blue bands    Reps 10-15    Time 10 Minutes      Recumbant Bike   Level 3    RPM 60    Minutes 15    METs 2.4      Recumbant Elliptical   Level 5    Minutes 15    METs 2.1             Social History   Tobacco Use  Smoking Status Never  Smokeless Tobacco Never    Goals Met:  Exercise tolerated well No report of concerns or symptoms today Strength training completed today  Goals Unmet:  Not Applicable  Comments: Service time is from 1024 to 1154    Dr. Mechele Collin is Medical Director for Pulmonary Rehab at [facility].

## 2023-03-10 ENCOUNTER — Encounter (HOSPITAL_COMMUNITY)
Admission: RE | Admit: 2023-03-10 | Discharge: 2023-03-10 | Disposition: A | Discharge status: Home / Self Care | Payer: No Typology Code available for payment source | Source: Ambulatory Visit | Attending: Pulmonary Disease | Admitting: Pulmonary Disease

## 2023-03-10 DIAGNOSIS — J449 Chronic obstructive pulmonary disease, unspecified: Secondary | ICD-10-CM

## 2023-03-10 NOTE — Progress Notes (Signed)
Daily Session Note  Patient Details  Name: [name] MRN: [id number] Date of Birth: 12-28-1950 Referring Provider:   [name] Pulmonary Rehab Walk Test from 01/24/2023 in [facility], [facility]  Referring Provider [name]       Encounter Date: 03/10/2023  Check In:  Session Check In - 03/10/23 1134       Check-In   Supervising physician immediately available to respond to emergencies [facility] immediately available    Physician(s) Eligha Bridegroom, NP    [facility]    Staff Present Essie Hart, RN, Doris Cheadle, MS, ACSM-CEP, Exercise Physiologist;Willer Osorno Hermine Messick Belarus, RD, LDN;Carlette Les Pou, RN, BSN    Virtual Visit No    Medication changes reported     No    Fall or balance concerns reported    No    Tobacco Cessation No Change    Warm-up and Cool-down Performed as group-led Writer Performed Yes    VAD Patient? No    PAD/SET Patient? No      Pain Assessment   Currently in Pain? No/denies    Pain Score 0-No pain    Multiple Pain Sites No             Capillary Blood Glucose: No results found for this or any previous visit (from the past 24 hour(s)).    Social History   Tobacco Use  Smoking Status Never  Smokeless Tobacco Never    Goals Met:  Proper associated with RPD/PD & O2 Sat Independence with exercise equipment Exercise tolerated well No report of concerns or symptoms today Strength training completed today  Goals Unmet:  Not Applicable  Comments: Service time is from 1013 to 1141.    Dr. Mechele Collin is Medical Director for Pulmonary Rehab at [facility].

## 2023-03-15 ENCOUNTER — Encounter (HOSPITAL_COMMUNITY)
Admission: RE | Admit: 2023-03-15 | Discharge: 2023-03-15 | Disposition: A | Discharge status: Home / Self Care | Payer: No Typology Code available for payment source | Source: Ambulatory Visit | Attending: Pulmonary Disease

## 2023-03-15 DIAGNOSIS — J449 Chronic obstructive pulmonary disease, unspecified: Secondary | ICD-10-CM

## 2023-03-15 NOTE — Progress Notes (Signed)
Daily Session Note  Patient Details  Name: [name] MRN: [id number] Date of Birth: Dec 20, 1950 Referring Provider:   [name] Pulmonary Rehab Walk Test from 01/24/2023 in [facility], [facility]  Referring Provider [name]       Encounter Date: 03/15/2023  Check In:  Session Check In - 03/15/23 1217       Check-In   Supervising physician immediately available to respond to emergencies [facility] immediately available    Physician(s) Robin Searing, NP    [facility]    Staff Present Essie Hart, RN, BSN;Casey Katrinka Blazing, RT;Samantha Belarus, RD, LDN;Carlette Les Pou, RN, BSN;Owin Vignola BS, ACSM-CEP, Exercise Physiologist    Virtual Visit No    Medication changes reported     No    Fall or balance concerns reported    No    Tobacco Cessation No Change    Warm-up and Cool-down Performed as group-led instruction    Resistance Training Performed Yes    VAD Patient? No    PAD/SET Patient? No      Pain Assessment   Currently in Pain? No/denies             Capillary Blood Glucose: No results found for this or any previous visit (from the past 24 hour(s)).    Social History   Tobacco Use  Smoking Status Never  Smokeless Tobacco Never    Goals Met:  Independence with exercise equipment Exercise tolerated well No report of concerns or symptoms today Strength training completed today  Goals Unmet:  Not Applicable  Comments: Service time is from 1017 to 1150.    Dr. Mechele Collin is Medical Director for Pulmonary Rehab at [facility].

## 2023-03-17 ENCOUNTER — Encounter (HOSPITAL_COMMUNITY)
Admission: RE | Admit: 2023-03-17 | Discharge: 2023-03-17 | Disposition: A | Discharge status: Home / Self Care | Payer: No Typology Code available for payment source | Source: Ambulatory Visit | Attending: Pulmonary Disease | Admitting: Pulmonary Disease

## 2023-03-17 DIAGNOSIS — Z79899 Other long term (current) drug therapy: Secondary | ICD-10-CM | POA: Diagnosis not present

## 2023-03-17 DIAGNOSIS — J449 Chronic obstructive pulmonary disease, unspecified: Secondary | ICD-10-CM | POA: Diagnosis present

## 2023-03-17 DIAGNOSIS — R0602 Shortness of breath: Secondary | ICD-10-CM | POA: Diagnosis present

## 2023-03-17 NOTE — Progress Notes (Signed)
Daily Session Note  Patient Details  Name: [name] MRN: [id number] Date of Birth: Dec 17, 1950 Referring Provider:   [name] Pulmonary Rehab Walk Test from 01/24/2023 in [facility] for Heart, Vascular, & Lung [facility]  Referring Provider [name]       Encounter Date: 03/17/2023  Check In:  Session Check In - 03/17/23 1140       Check-In   Supervising physician immediately available to respond to emergencies [facility] immediately available    Physician(s) Carlyon Shadow, NP    [facility]    Staff Present Samantha Belarus, RD, LDN;Randi Dionisio Paschal, ACSM-CEP, Exercise Physiologist;Orlondo Holycross Gerre Scull, RN, BSN;Casey Smith, RT;Jetta Walker BS, ACSM-CEP, Exercise Physiologist    Virtual Visit No    Medication changes reported     No    Fall or balance concerns reported    No    Tobacco Cessation No Change    Warm-up and Cool-down Performed as group-led instruction    Resistance Training Performed Yes    VAD Patient? No    PAD/SET Patient? No      Pain Assessment   Currently in Pain? No/denies    Multiple Pain Sites No             Capillary Blood Glucose: No results found for this or any previous visit (from the past 24 hour(s)).    Social History   Tobacco Use  Smoking Status Never  Smokeless Tobacco Never    Goals Met:  Independence with exercise equipment Exercise tolerated well No report of concerns or symptoms today Strength training completed today  Goals Unmet:  Not Applicable  Comments: Service time is from 1012 to 1153    Dr. Mechele Collin is Medical Director for Pulmonary Rehab at [facility].

## 2023-03-22 ENCOUNTER — Encounter (HOSPITAL_COMMUNITY)
Admission: RE | Admit: 2023-03-22 | Discharge: 2023-03-22 | Disposition: A | Discharge status: Home / Self Care | Payer: No Typology Code available for payment source | Source: Ambulatory Visit | Attending: Pulmonary Disease

## 2023-03-22 VITALS — Wt 246.0 lb

## 2023-03-22 DIAGNOSIS — J449 Chronic obstructive pulmonary disease, unspecified: Secondary | ICD-10-CM | POA: Diagnosis not present

## 2023-03-22 LAB — GLUCOSE, CAPILLARY: Glucose-Capillary: 147 mg/dL — ABNORMAL HIGH (ref 70–99)

## 2023-03-22 NOTE — Progress Notes (Signed)
Daily Session Note  Patient Details  Name: [name] MRN: [id number] Date of Birth: 1951/01/26 Referring Provider:   [name] Pulmonary Rehab Walk Test from 01/24/2023 in [facility]  Referring Provider [name]       Encounter Date: 03/22/2023  Check In:  Session Check In - 03/22/23 1107       Check-In   Supervising physician immediately available to respond to emergencies [facility] MD immediately available    Physician(s) Carlyon Shadow, NP    [facility]    Staff Present Samantha Belarus, RD, Rexene Agent, MS, ACSM-CEP, Exercise Physiologist;Randi Dionisio Paschal, ACSM-CEP, Exercise Physiologist;Mary Gerre Scull, RN, BSN;Johnny Porter, MS, Exercise Physiologist;Boone Gear Katrinka Blazing, RT    Virtual Visit No    Medication changes reported     No    Fall or balance concerns reported    No    Tobacco Cessation No Change    Warm-up and Cool-down Performed as group-led instruction    Resistance Training Performed Yes    VAD Patient? No    PAD/SET Patient? No      Pain Assessment   Currently in Pain? Yes    Pain Score 9     Pain Location Leg    Pain Orientation Left;Right    Pain Descriptors / Indicators Dull    Pain Type Chronic pain    Pain Onset More than a month ago    Pain Frequency Intermittent    Pain Relieving Factors tylenol    Multiple Pain Sites No             Capillary Blood Glucose: Results for orders placed or performed during the hospital encounter of 03/17/23 (from the past 24 hour(s))  Glucose, capillary     Status: Abnormal   Collection Time: 03/22/23 10:12 AM  Result Value Ref Range   Glucose-Capillary 147 (H) 70 - 99 mg/dL     Exercise Prescription Changes - 03/22/23 1200       Response to Exercise   Blood Pressure (Admit) 150/56    Blood Pressure (Exercise) 192/68    Blood Pressure (Exit) 150/60    Heart Rate (Admit) 87 bpm    Heart Rate (Exercise) 129 bpm     Heart Rate (Exit) 103 bpm    Oxygen Saturation (Admit) 97 %    Oxygen Saturation (Exercise) 97 %    Oxygen Saturation (Exit) 96 %    Rating of Perceived Exertion (Exercise) 13    Perceived Dyspnea (Exercise) 3    Duration Continue with 30 min of aerobic exercise without signs/symptoms of physical distress.    Intensity THRR unchanged      Resistance Training   Training Prescription Yes    Weight blue bands    Reps 10-15    Time 10 Minutes      Recumbant Bike   Level 5    Minutes 15    METs 3.4      Recumbant Elliptical   Level 5    Minutes 15    METs 4.5             Social History   Tobacco Use  Smoking Status Never  Smokeless Tobacco Never    Goals Met:  Proper associated with RPD/PD & O2 Sat Independence with exercise equipment Exercise tolerated well No report of concerns or symptoms today Strength training completed today  Goals Unmet:  Not Applicable  Comments: Service time is from 1007 to  1137.    Dr. Mechele Collin is Medical Director for Pulmonary Rehab at [facility].

## 2023-03-23 ENCOUNTER — Telehealth (HOSPITAL_COMMUNITY): Payer: Self-pay

## 2023-03-23 NOTE — Telephone Encounter (Signed)
Dr. Wynona Neat, Can we get a target heart rate increase to 130 for [name] when exercising in pulmonary rehab?

## 2023-03-23 NOTE — Progress Notes (Signed)
Pulmonary Individual Treatment Plan  Patient Details  Name: [name] MRN: [id number] Date of Birth: Aug 10, 1951 Referring Provider:   [name] Pulmonary Rehab Walk Test from 01/24/2023 in [facility]  Referring Provider [name]       Initial Encounter Date:  Flowsheet Row Pulmonary Rehab Walk Test from 01/24/2023 in [facility]  Date 01/24/23       Visit Diagnosis: Stage 2 moderate COPD by GOLD classification (HCC)  Patient's Home Medications on Admission:   Current Outpatient Medications:    albuterol (PROVENTIL) (2.5 MG/3ML) 0.083% nebulizer solution, Take 3 mLs (2.5 mg total) by nebulization every 4 (four) hours as needed for wheezing or shortness of breath., Disp: 1080 mL, Rfl: 2   albuterol (VENTOLIN HFA) 108 (90 Base) MCG/ACT inhaler, Inhale 2 puffs into the lungs every 6 (six) hours as needed for wheezing or shortness of breath., Disp: 8 g, Rfl: 5   amLODipine (NORVASC) 10 MG tablet, Take 10 mg by mouth daily., Disp: , Rfl:    aspirin EC 81 MG tablet, Take 81 mg by mouth daily. Swallow whole., Disp: , Rfl:    azithromycin (ZITHROMAX Z-PAK) 250 MG tablet, Take 2 tablets day 1 and then 1 daily for 4 days, Disp: 6 each, Rfl: 0   b complex vitamins capsule, Take 1 capsule by mouth daily., Disp: , Rfl:    buPROPion (WELLBUTRIN SR) 150 MG 12 hr tablet, Take 150 mg by mouth daily., Disp: , Rfl:    carbamide peroxide (DEBROX) 6.5 % OTIC solution, Place 5 drops into both ears as needed (ear wax)., Disp: , Rfl:    Cholecalciferol (VITAMIN D3) 1000 UNITS CAPS, Take 1,000 Units by mouth every morning., Disp: , Rfl:    empagliflozin (JARDIANCE) 25 MG TABS tablet, Take 25 mg by mouth daily. Take 1/2 tablet every morning, Disp: , Rfl:    FERGON 240 (27 Fe) MG tablet, Take 240 mg by mouth daily., Disp: , Rfl:    fexofenadine (ALLEGRA) 180 MG tablet, Take 180 mg by  mouth daily., Disp: , Rfl:    fluticasone (FLONASE) 50 MCG/ACT nasal spray, Place 1 spray into both nostrils in the morning and at bedtime., Disp: , Rfl:    fluticasone-salmeterol (WIXELA INHUB) 250-50 MCG/ACT AEPB, Inhale 1 puff into the lungs in the morning and at bedtime., Disp: 1 each, Rfl: 5   gabapentin (NEURONTIN) 400 MG capsule, Take 400 mg by mouth 3 (three) times daily., Disp: , Rfl:    hydrALAZINE (APRESOLINE) 50 MG tablet, Take 50 mg by mouth 3 (three) times daily., Disp: , Rfl:    hydrOXYzine (ATARAX/VISTARIL) 10 MG tablet, Take 20 mg by mouth at bedtime., Disp: , Rfl:    losartan (COZAAR) 100 MG tablet, Take 100 mg by mouth every evening., Disp: , Rfl:    melatonin 3 MG TABS tablet, Take 3 mg by mouth at bedtime., Disp: , Rfl:    metFORMIN (GLUCOPHAGE) 500 MG tablet, Take 500 mg by mouth daily with breakfast., Disp: , Rfl:    modafinil (PROVIGIL) 100 MG tablet, Take 200 mg (2 tablets) in the morning and 100 mg (one tablet) at noon as directed, Disp: 90 tablet, Rfl: 2   NON FORMULARY, BIPAP at bedtime, Disp: , Rfl:    omeprazole (PRILOSEC OTC) 20 MG tablet, Take 1 tablet (20 mg total) by mouth daily., Disp: 30 tablet, Rfl: 2   PARoxetine (PAXIL)  40 MG tablet, Take 40 mg by mouth at bedtime., Disp: , Rfl:    pravastatin (PRAVACHOL) 40 MG tablet, Take 40 mg by mouth at bedtime., Disp: , Rfl:    Probiotic Product (PROBIOTIC DAILY PO), Take 1 capsule by mouth daily., Disp: , Rfl:    QUEtiapine (SEROQUEL) 25 MG tablet, Take 50 mg by mouth at bedtime., Disp: , Rfl:    spironolactone (ALDACTONE) 25 MG tablet, Take 50 mg by mouth daily. , Disp: , Rfl:    tamsulosin (FLOMAX) 0.4 MG CAPS capsule, Take 0.4 mg by mouth every morning., Disp: , Rfl:    Tiotropium Bromide Monohydrate (SPIRIVA RESPIMAT) 2.5 MCG/ACT AERS, Inhale 2 puffs into the lungs daily., Disp: 1 g, Rfl: 5   triamcinolone ointment (KENALOG) 0.1 %, Apply 1 application  topically daily as needed (rash)., Disp: , Rfl:    Turmeric  400 MG CAPS, Take 400 mg by mouth daily., Disp: , Rfl:    vitamin E 400 UNIT capsule, Take 400 Units by mouth daily., Disp: , Rfl:   Past Medical History: Past Medical History:  Diagnosis Date   Abnormal CT scan of lung 07/23/2021   Abnormality of gait    Allergic rhinitis due to pollen 11/25/2010   Bilateral primary osteoarthritis of hip 12/12/2019   Carpal tunnel syndrome on right    Contact with and (suspected) exposure to asbestos 07/23/2021   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   Hyperlipidemia    Hypothyroidism    Klippel-Feil sequence    Learning disability, unspecified 1997   Reading/reading comprehension   Left inguinal hernia    Low back pain 12/12/2019   Lumbar spondylosis    Major depressive disorder    Mild cognitive impairment 10/18/2022   Nonspecific abnormal electrocardiogram (ECG) (EKG)    Obstructive sleep apnea 12/15/2015   Osteoarthritis    Osteoporosis    Pain in joints of right hand    Peripheral neuropathy    Peripheral venous insufficiency    Pituitary abnormality    pituitary edema; takes bromocriptine   PTSD (post-traumatic stress disorder) 11/25/2010   Spinal stenosis of lumbar region 09/14/2018   Type 2 diabetes mellitus without complications 11/25/2010   Vitamin D deficiency    Weakness of left leg    Wears glasses     Tobacco Use: Social History   Tobacco Use  Smoking Status Never  Smokeless Tobacco Never    Labs: Review Flowsheet       Latest Ref Rng & Units 08/06/2008 07/18/2010  09/05/2012  Labs for ITP Cardiac and Pulmonary Rehab  Hemoglobin A1c 4.0 - 6.0 % 6.7 (NOTE)   The ADA recommends the following therapeutic goal for glycemic   control related to Hgb A1C measurement:   Goal of Therapy:   < 7.0% Hgb A1C   Reference: American Diabetes Association: Clinical Practice   Recommendations 2008, Diabetes Care,  2008, 31:(Suppl 1).  - 6.3   TCO2 0 - 100 mmol/L - 30  -    Details  Capillary Blood Glucose: Lab Results  Component Value Date   GLUCAP 147 (H) 03/22/2023   GLUCAP 158 (H) 03/08/2023   GLUCAP 101 (H) 03/01/2023   GLUCAP 196 (H) 03/01/2023   GLUCAP 74 02/01/2023    POCT Glucose     Row Name 01/24/23 1156             POCT Blood Glucose   Pre-Exercise 157 mg/dL                Pulmonary Assessment Scores:  Pulmonary Assessment Scores     Row Name 01/24/23 1153 02/01/23 1122       ADL UCSD   ADL Phase Entry --    SOB Score total 56 --      CAT Score   CAT Score 22 --      mMRC Score   mMRC Score 3 --            UCSD: Self-administered rating of dyspnea associated with activities of daily living (ADLs) 6-point scale (0 = "not at all" to 5 = "maximal or unable to do because of breathlessness")  Scoring Scores range from 0 to 120.  Minimally important difference is 5 units  CAT: CAT can identify the health impairment of COPD patients and is better correlated with disease progression.  CAT has a scoring range of zero to 40. The CAT score is classified into four groups of low (less than 10), medium (10 - 20), high (21-30) and very high (31-40) based on the impact level of disease on health status. A CAT score over 10 suggests significant symptoms.  A worsening CAT score could be explained by an exacerbation, poor medication adherence, poor inhaler technique, or progression of COPD or comorbid conditions.  CAT MCID is 2 points  mMRC: mMRC (Modified Medical Research Council) Dyspnea Scale is used to assess the degree  of baseline functional disability in patients of respiratory disease due to dyspnea. No minimal important difference is established. A decrease in score of 1 point or greater is considered a positive change.   Pulmonary Function Assessment:  Pulmonary Function Assessment - 01/24/23 1213       Breath   Bilateral Breath Sounds Clear    Shortness of Breath Yes             Exercise Target Goals: Exercise Program Goal: Individual exercise prescription set using results from initial 6 min walk test and THRR while considering  patient's activity barriers and safety.   Exercise Prescription Goal: Initial exercise prescription builds to 30-45 minutes a day of aerobic activity, 2-3 days per week.  Home exercise guidelines will be given to patient during program as part of exercise prescription that the participant will acknowledge.  Activity Barriers & Risk Stratification:  Activity Barriers & Cardiac Risk Stratification - 01/24/23 1135       Activity Barriers & Cardiac Risk Stratification   Activity Barriers Muscular Weakness;Arthritis;Deconditioning;Shortness of Breath;Left Knee Replacement;Right Knee Replacement;Right Hip Replacement;Back Problems;Neck/Spine Problems;Balance Concerns    Cardiac Risk Stratification Moderate             6 Minute Walk:  6 Minute Walk     Row Name 01/26/23 0839         6 Minute Walk   Phase Initial     Distance 735 feet     Walk Time 6 minutes     # of Rest Breaks 0     MPH 1.39     METS 2.02  RPE 11     Perceived Dyspnea  1     VO2 Peak 7.05     Symptoms No     Resting HR 58 bpm     Resting BP 164/50     Resting Oxygen Saturation  98 %     Exercise Oxygen Saturation  during 6 min walk 96 %     Max Ex. HR 112 bpm     Max Ex. BP 166/58     2 Minute Post BP 158/58       Interval HR   1 Minute HR 103     2 Minute HR 107     3 Minute HR 107     4 Minute HR 108     5 Minute HR 109     6 Minute HR 112     2 Minute Post HR 65      Interval Heart Rate? Yes       Interval Oxygen   Interval Oxygen? Yes     Baseline Oxygen Saturation % 98 %     1 Minute Oxygen Saturation % 96 %     1 Minute Liters of Oxygen 0 L     2 Minute Oxygen Saturation % 96 %     2 Minute Liters of Oxygen 0 L     3 Minute Oxygen Saturation % 97 %     3 Minute Liters of Oxygen 0 L     4 Minute Oxygen Saturation % 96 %     4 Minute Liters of Oxygen 0 L     5 Minute Oxygen Saturation % 97 %     5 Minute Liters of Oxygen 0 L     6 Minute Oxygen Saturation % 97 %     6 Minute Liters of Oxygen 0 L     2 Minute Post Oxygen Saturation % 98 %     2 Minute Post Liters of Oxygen 0 L              Oxygen Initial Assessment:  Oxygen Initial Assessment - 01/24/23 1137       Home Oxygen   Home Oxygen Device None    Sleep Oxygen Prescription BiPAP    Home Exercise Oxygen Prescription None    Home Resting Oxygen Prescription None      Initial 6 min Walk   Oxygen Used None      Program Oxygen Prescription   Program Oxygen Prescription None      Intervention   Short Term Goals To learn and exhibit compliance with exercise, home and travel O2 prescription;To learn and understand importance of maintaining oxygen saturations>88%;To learn and demonstrate proper use of respiratory medications;To learn and understand importance of monitoring SPO2 with pulse oximeter and demonstrate accurate use of the pulse oximeter.;To learn and demonstrate proper pursed lip breathing techniques or other breathing techniques.     Long  Term Goals Exhibits compliance with exercise, home  and travel O2 prescription;Maintenance of O2 saturations>88%;Compliance with respiratory medication;Verbalizes importance of monitoring SPO2 with pulse oximeter and return demonstration;Exhibits proper breathing techniques, such as pursed lip breathing or other method taught during program session;Demonstrates proper use of MDI's             Oxygen Re-Evaluation:  Oxygen  Re-Evaluation     Row Name 01/25/23 0753 02/18/23 0954 03/11/23 1346         Program Oxygen Prescription   Program Oxygen Prescription None None None  Home Oxygen   Home Oxygen Device None None None     Sleep Oxygen Prescription BiPAP BiPAP BiPAP     Home Exercise Oxygen Prescription None None None     Home Resting Oxygen Prescription None None None       Goals/Expected Outcomes   Short Term Goals To learn and exhibit compliance with exercise, home and travel O2 prescription;To learn and understand importance of maintaining oxygen saturations>88%;To learn and demonstrate proper use of respiratory medications;To learn and understand importance of monitoring SPO2 with pulse oximeter and demonstrate accurate use of the pulse oximeter.;To learn and demonstrate proper pursed lip breathing techniques or other breathing techniques.  To learn and exhibit compliance with exercise, home and travel O2 prescription;To learn and understand importance of maintaining oxygen saturations>88%;To learn and demonstrate proper use of respiratory medications;To learn and understand importance of monitoring SPO2 with pulse oximeter and demonstrate accurate use of the pulse oximeter.;To learn and demonstrate proper pursed lip breathing techniques or other breathing techniques.  To learn and exhibit compliance with exercise, home and travel O2 prescription;To learn and understand importance of maintaining oxygen saturations>88%;To learn and demonstrate proper use of respiratory medications;To learn and understand importance of monitoring SPO2 with pulse oximeter and demonstrate accurate use of the pulse oximeter.;To learn and demonstrate proper pursed lip breathing techniques or other breathing techniques.      Long  Term Goals Exhibits compliance with exercise, home  and travel O2 prescription;Maintenance of O2 saturations>88%;Compliance with respiratory medication;Verbalizes importance of monitoring SPO2 with pulse  oximeter and return demonstration;Exhibits proper breathing techniques, such as pursed lip breathing or other method taught during program session;Demonstrates proper use of MDI's Exhibits compliance with exercise, home  and travel O2 prescription;Maintenance of O2 saturations>88%;Compliance with respiratory medication;Verbalizes importance of monitoring SPO2 with pulse oximeter and return demonstration;Exhibits proper breathing techniques, such as pursed lip breathing or other method taught during program session;Demonstrates proper use of MDI's Exhibits compliance with exercise, home  and travel O2 prescription;Maintenance of O2 saturations>88%;Compliance with respiratory medication;Verbalizes importance of monitoring SPO2 with pulse oximeter and return demonstration;Exhibits proper breathing techniques, such as pursed lip breathing or other method taught during program session;Demonstrates proper use of MDI's     Goals/Expected Outcomes Compliance and understanding of oxygen saturation monitoring and breathing techniques to decrease shortness of breath. Compliance and understanding of oxygen saturation monitoring and breathing techniques to decrease shortness of breath. Compliance and understanding of oxygen saturation monitoring and breathing techniques to decrease shortness of breath.              Oxygen Discharge (Final Oxygen Re-Evaluation):  Oxygen Re-Evaluation - 03/11/23 1346       Program Oxygen Prescription   Program Oxygen Prescription None      Home Oxygen   Home Oxygen Device None    Sleep Oxygen Prescription BiPAP    Home Exercise Oxygen Prescription None    Home Resting Oxygen Prescription None      Goals/Expected Outcomes   Short Term Goals To learn and exhibit compliance with exercise, home and travel O2 prescription;To learn and understand importance of maintaining oxygen saturations>88%;To learn and demonstrate proper use of respiratory medications;To learn and understand  importance of monitoring SPO2 with pulse oximeter and demonstrate accurate use of the pulse oximeter.;To learn and demonstrate proper pursed lip breathing techniques or other breathing techniques.     Long  Term Goals Exhibits compliance with exercise, home  and travel O2 prescription;Maintenance of O2 saturations>88%;Compliance with respiratory medication;Verbalizes importance of monitoring  SPO2 with pulse oximeter and return demonstration;Exhibits proper breathing techniques, such as pursed lip breathing or other method taught during program session;Demonstrates proper use of MDI's    Goals/Expected Outcomes Compliance and understanding of oxygen saturation monitoring and breathing techniques to decrease shortness of breath.             Initial Exercise Prescription:  Initial Exercise Prescription - 01/24/23 1200       Date of Initial Exercise RX and Referring Provider   Date 01/24/23    Referring Provider [name]    Expected Discharge Date 04/21/23      Recumbant Bike   Level 1    RPM 60    Minutes 15    METs 2      Recumbant Elliptical   Level 1    RPM 60    Minutes 15    METs 2      Prescription Details   Frequency (times per week) 2    Duration Progress to 30 minutes of continuous aerobic without signs/symptoms of physical distress      Intensity   THRR 40-80% of Max Heartrate 57-114    Ratings of Perceived Exertion 11-13    Perceived Dyspnea 0-4      Progression   Progression Continue progressive overload as per policy without signs/symptoms or physical distress.      Resistance Training   Training Prescription Yes    Weight blue bands    Reps 10-15             Perform Capillary Blood Glucose checks as needed.  Exercise Prescription Changes:   Exercise Prescription Changes     Row Name 02/08/23 1200 02/15/23 1217 02/22/23 1200 03/08/23 1200 03/22/23 1200     Response to Exercise   Blood Pressure (Admit) 172/50 144/60 -- 154/58 150/56   Blood  Pressure (Exercise) 198/64 182/68 -- 170/60 192/68   Blood Pressure (Exit) 142/58 140/50 -- 140/60 150/60   Heart Rate (Admit) 90 bpm 92 bpm -- 97 bpm 87 bpm   Heart Rate (Exercise) 124 bpm 121 bpm -- 121 bpm 129 bpm   Heart Rate (Exit) 96 bpm 95 bpm -- 98 bpm 103 bpm   Oxygen Saturation (Admit) 98 % 98 % -- 98 % 97 %   Oxygen Saturation (Exercise) 95 % 96 % -- 99 % 97 %   Oxygen Saturation (Exit) 97 % 97 % -- 998 % 96 %   Rating of Perceived Exertion (Exercise) 15 12 -- 13 13   Perceived Dyspnea (Exercise) 3 2 -- 2 3   Duration Continue with 30 min of aerobic exercise without signs/symptoms of physical distress. Continue with 30 min of aerobic exercise without signs/symptoms of physical distress. -- Continue with 30 min of aerobic exercise without signs/symptoms of physical distress. Continue with 30 min of aerobic exercise without signs/symptoms of physical distress.   Intensity THRR unchanged THRR unchanged -- THRR unchanged THRR unchanged     Progression   Average METs -- 2.6 -- -- --     Resistance Training   Training Prescription Yes Yes -- Yes Yes   Weight blue bands blue bands -- blue bands blue bands   Reps 10-15 10-15 -- 10-15 10-15   Time 10 Minutes 10 Minutes -- 10 Minutes 10 Minutes     Recumbant Bike   Level 4 4 -- 3 5   RPM -- 60 -- 60 --   Minutes 15 15 -- 15 15   METs 3 2.8 --  2.4 3.4     Recumbant Elliptical   Level 4 4 -- 5 5   Minutes 15 15 -- 15 15   METs 2 2.4 -- 2.1 4.5            Exercise Comments:   Exercise Comments     Row Name 02/01/23 1127           Exercise Comments Pt completed first day of group exercise. Pt exercised on the recumbent bike, level 3, for 15 min, METs 2.3. He then exercised on the recumbent elliptical for 15 min, level 4, METs 1.8. His BP and HR were elevated therefore had to slow down. Pt performed warm up and cool down without limitation, including squats. Discussed METs with good reception.                 Exercise Goals and Review:   Exercise Goals     Row Name 01/24/23 1137 01/25/23 0751 02/18/23 0949 03/11/23 1343       Exercise Goals   Increase Physical Activity Yes Yes Yes Yes    Intervention Provide advice, education, support and counseling about physical activity/exercise needs.;Develop an individualized exercise prescription for aerobic and resistive training based on initial evaluation findings, risk stratification, comorbidities and participant's personal goals. Provide advice, education, support and counseling about physical activity/exercise needs.;Develop an individualized exercise prescription for aerobic and resistive training based on initial evaluation findings, risk stratification, comorbidities and participant's personal goals. Provide advice, education, support and counseling about physical activity/exercise needs.;Develop an individualized exercise prescription for aerobic and resistive training based on initial evaluation findings, risk stratification, comorbidities and participant's personal goals. Provide advice, education, support and counseling about physical activity/exercise needs.;Develop an individualized exercise prescription for aerobic and resistive training based on initial evaluation findings, risk stratification, comorbidities and participant's personal goals.    Expected Outcomes Short Term: Attend rehab on a regular basis to increase amount of physical activity.;Long Term: Exercising regularly at least 3-5 days a week.;Long Term: Add in home exercise to make exercise part of routine and to increase amount of physical activity. Short Term: Attend rehab on a regular basis to increase amount of physical activity.;Long Term: Exercising regularly at least 3-5 days a week.;Long Term: Add in home exercise to make exercise part of routine and to increase amount of physical activity. Short Term: Attend rehab on a regular basis to increase amount of physical activity.;Long  Term: Exercising regularly at least 3-5 days a week.;Long Term: Add in home exercise to make exercise part of routine and to increase amount of physical activity. Short Term: Attend rehab on a regular basis to increase amount of physical activity.;Long Term: Exercising regularly at least 3-5 days a week.;Long Term: Add in home exercise to make exercise part of routine and to increase amount of physical activity.    Increase Strength and Stamina Yes Yes Yes Yes    Intervention Provide advice, education, support and counseling about physical activity/exercise needs.;Develop an individualized exercise prescription for aerobic and resistive training based on initial evaluation findings, risk stratification, comorbidities and participant's personal goals. Provide advice, education, support and counseling about physical activity/exercise needs.;Develop an individualized exercise prescription for aerobic and resistive training based on initial evaluation findings, risk stratification, comorbidities and participant's personal goals. Provide advice, education, support and counseling about physical activity/exercise needs.;Develop an individualized exercise prescription for aerobic and resistive training based on initial evaluation findings, risk stratification, comorbidities and participant's personal goals. Provide advice, education, support and counseling about physical activity/exercise  needs.;Develop an individualized exercise prescription for aerobic and resistive training based on initial evaluation findings, risk stratification, comorbidities and participant's personal goals.    Expected Outcomes Short Term: Increase workloads from initial exercise prescription for resistance, speed, and METs.;Short Term: Perform resistance training exercises routinely during rehab and add in resistance training at home;Long Term: Improve cardiorespiratory fitness, muscular endurance and strength as measured by increased METs and  functional capacity ( ) Short Term: Increase workloads from initial exercise prescription for resistance, speed, and METs.;Short Term: Perform resistance training exercises routinely during rehab and add in resistance training at home;Long Term: Improve cardiorespiratory fitness, muscular endurance and strength as measured by increased METs and functional capacity ( ) Short Term: Increase workloads from initial exercise prescription for resistance, speed, and METs.;Short Term: Perform resistance training exercises routinely during rehab and add in resistance training at home;Long Term: Improve cardiorespiratory fitness, muscular endurance and strength as measured by increased METs and functional capacity ( ) Short Term: Increase workloads from initial exercise prescription for resistance, speed, and METs.;Short Term: Perform resistance training exercises routinely during rehab and add in resistance training at home;Long Term: Improve cardiorespiratory fitness, muscular endurance and strength as measured by increased METs and functional capacity ( )    Able to understand and use rate of perceived exertion (RPE) scale Yes Yes Yes Yes    Intervention Provide education and explanation on how to use RPE scale Provide education and explanation on how to use RPE scale Provide education and explanation on how to use RPE scale Provide education and explanation on how to use RPE scale    Expected Outcomes Short Term: Able to use RPE daily in rehab to express subjective intensity level;Long Term:  Able to use RPE to guide intensity level when exercising independently Short Term: Able to use RPE daily in rehab to express subjective intensity level;Long Term:  Able to use RPE to guide intensity level when exercising independently Short Term: Able to use RPE daily in rehab to express subjective intensity level;Long Term:  Able to use RPE to guide intensity level when exercising independently Short Term: Able to use  RPE daily in rehab to express subjective intensity level;Long Term:  Able to use RPE to guide intensity level when exercising independently    Able to understand and use Dyspnea scale Yes Yes Yes Yes    Intervention Provide education and explanation on how to use Dyspnea scale Provide education and explanation on how to use Dyspnea scale Provide education and explanation on how to use Dyspnea scale Provide education and explanation on how to use Dyspnea scale    Expected Outcomes Short Term: Able to use Dyspnea scale daily in rehab to express subjective sense of shortness of breath during exertion;Long Term: Able to use Dyspnea scale to guide intensity level when exercising independently Short Term: Able to use Dyspnea scale daily in rehab to express subjective sense of shortness of breath during exertion;Long Term: Able to use Dyspnea scale to guide intensity level when exercising independently Short Term: Able to use Dyspnea scale daily in rehab to express subjective sense of shortness of breath during exertion;Long Term: Able to use Dyspnea scale to guide intensity level when exercising independently Short Term: Able to use Dyspnea scale daily in rehab to express subjective sense of shortness of breath during exertion;Long Term: Able to use Dyspnea scale to guide intensity level when exercising independently    Knowledge and understanding of Target Heart Rate Range (THRR) Yes Yes Yes Yes    Intervention Provide  education and explanation of THRR including how the numbers were predicted and where they are located for reference Provide education and explanation of THRR including how the numbers were predicted and where they are located for reference Provide education and explanation of THRR including how the numbers were predicted and where they are located for reference Provide education and explanation of THRR including how the numbers were predicted and where they are located for reference    Expected  Outcomes Short Term: Able to state/look up THRR;Long Term: Able to use THRR to govern intensity when exercising independently;Short Term: Able to use daily as guideline for intensity in rehab Short Term: Able to state/look up THRR;Long Term: Able to use THRR to govern intensity when exercising independently;Short Term: Able to use daily as guideline for intensity in rehab Short Term: Able to state/look up THRR;Long Term: Able to use THRR to govern intensity when exercising independently;Short Term: Able to use daily as guideline for intensity in rehab Short Term: Able to state/look up THRR;Long Term: Able to use THRR to govern intensity when exercising independently;Short Term: Able to use daily as guideline for intensity in rehab    Understanding of Exercise Prescription Yes Yes Yes Yes    Intervention Provide education, explanation, and written materials on patient's individual exercise prescription Provide education, explanation, and written materials on patient's individual exercise prescription Provide education, explanation, and written materials on patient's individual exercise prescription Provide education, explanation, and written materials on patient's individual exercise prescription    Expected Outcomes Short Term: Able to explain program exercise prescription;Long Term: Able to explain home exercise prescription to exercise independently Short Term: Able to explain program exercise prescription;Long Term: Able to explain home exercise prescription to exercise independently Short Term: Able to explain program exercise prescription;Long Term: Able to explain home exercise prescription to exercise independently Short Term: Able to explain program exercise prescription;Long Term: Able to explain home exercise prescription to exercise independently             Exercise Goals Re-Evaluation :  Exercise Goals Re-Evaluation     Row Name 01/25/23 0752 02/18/23 0949 03/11/23 1343          Exercise Goal Re-Evaluation   Exercise Goals Review Increase Physical Activity;Able to understand and use Dyspnea scale;Understanding of Exercise Prescription;Increase Strength and Stamina;Knowledge and understanding of Target Heart Rate Range (THRR);Able to understand and use rate of perceived exertion (RPE) scale Increase Physical Activity;Able to understand and use Dyspnea scale;Understanding of Exercise Prescription;Increase Strength and Stamina;Knowledge and understanding of Target Heart Rate Range (THRR);Able to understand and use rate of perceived exertion (RPE) scale Increase Physical Activity;Able to understand and use Dyspnea scale;Understanding of Exercise Prescription;Increase Strength and Stamina;Knowledge and understanding of Target Heart Rate Range (THRR);Able to understand and use rate of perceived exertion (RPE) scale     Comments Pt is scheduled to begin exercise next week. Will continue to monitor and progress as able. Nishil has completed 5 exercise sessions. He exercises for 15 min on the recumbent bike and recumbent elliptical. Pt averages 2.8 METs at level 4 on the recumbent bike and 2.4 MEts at level 4 on the recumbent elliptical. He performs the warmup and cooldown standing without limitations. Jeremi greatly increased his workload for both exercise modes on his first day. This caused an elevated BP and drop in his CBG. He was decreased then slowly increased.  He tolerated the slower increase in workload better. Kourtland seems very motivated to exercise. Will continue to monitor and progress  as able. Algene has completed 9 exercise sessions. He exercises for 15 min on the recumbent bike and recumbent elliptical. Pt averages 2.8 METs at ;evel 5 on the recumbent bike and 2.2 METs at level 5 on the recumbent elliptical. He performs the warmup and cooldown standing without limitations. Marbin has increased his workloads for both exercise modes as METs have increased. He BP and CBG are better  managed with exercise. He is understanding pacing strategies when exercising. Lino seems motivated to exercise and improve his functional capacity. Will continue to monitor and progress as able.     Expected Outcomes Through exercise at rehab and home, the patient will decrease shortness of breath with daily activities and feel confident in carrying out an exercise regimen at home. Through exercise at rehab and home, the patient will decrease shortness of breath with daily activities and feel confident in carrying out an exercise regimen at home. Through exercise at rehab and home, the patient will decrease shortness of breath with daily activities and feel confident in carrying out an exercise regimen at home.              Discharge Exercise Prescription (Final Exercise Prescription Changes):  Exercise Prescription Changes - 03/22/23 1200       Response to Exercise   Blood Pressure (Admit) 150/56    Blood Pressure (Exercise) 192/68    Blood Pressure (Exit) 150/60    Heart Rate (Admit) 87 bpm    Heart Rate (Exercise) 129 bpm    Heart Rate (Exit) 103 bpm    Oxygen Saturation (Admit) 97 %    Oxygen Saturation (Exercise) 97 %    Oxygen Saturation (Exit) 96 %    Rating of Perceived Exertion (Exercise) 13    Perceived Dyspnea (Exercise) 3    Duration Continue with 30 min of aerobic exercise without signs/symptoms of physical distress.    Intensity THRR unchanged      Resistance Training   Training Prescription Yes    Weight blue bands    Reps 10-15    Time 10 Minutes      Recumbant Bike   Level 5    Minutes 15    METs 3.4      Recumbant Elliptical   Level 5    Minutes 15    METs 4.5             Nutrition:  Target Goals: Understanding of nutrition guidelines, daily intake of sodium 1500mg , cholesterol 200mg , calories 30% from fat and 7% or less from saturated fats, daily to have 5 or more servings of fruits and vegetables.  Biometrics:  Pre Biometrics - 01/24/23  1258       Pre Biometrics   Grip Strength 28 kg              Nutrition Therapy Plan and Nutrition Goals:  Nutrition Therapy & Goals - 03/03/23 1108       Nutrition Therapy   Diet Heart Healthy Diet    Drug/Food Interactions Statins/Certain Fruits      Personal Nutrition Goals   Nutrition Goal Patient to improve diet quality by using the plate method as a guide for meal planning to include lean protein/plant protein, fruits, vegetables, whole grains, nonfat dairy as part of a well-balanced diet.   goal in progress.   Personal Goal #2 Patient to reduce sodium intake to 1500mg  per day   goal in progress.   Personal Goal #3 Patient to identify strategies for weight loss of  0.5-2.0# per week.   goal in progress.   Comments Goals in action. Kamuela has excellent knowledge of strategies for blood pressure control including reduction of sodium intake and increased high fiber/high potassium. He has also previously completed nutrition education through the [place]. Breven does report motivation to lose weight to ~217# as recommended by his doctor; he is down ~2# since starting with our program. He has been working on increasing his physical activity and decreasing portion sizes. His wife remains a good support. Suchir will benefit from participation in pulmonary rehab for nutrition, exercise, and lifestyle changes.      Intervention Plan   Intervention Prescribe, educate and counsel regarding individualized specific dietary modifications aiming towards targeted core components such as weight, hypertension, lipid management, diabetes, heart failure and other comorbidities.;Nutrition handout(s) given to patient.    Expected Outcomes Short Term Goal: Understand basic principles of dietary content, such as calories, fat, sodium, cholesterol and nutrients.;Long Term Goal: Adherence to prescribed nutrition plan.             Nutrition Assessments:  Nutrition Assessments - 02/01/23 1148        Rate Your Plate Scores   Pre Score 59            MEDIFICTS Score Key: ?70 Need to make dietary changes  40-70 Heart Healthy Diet ? 40 Therapeutic Level Cholesterol Diet  Flowsheet Row PULMONARY REHAB CHRONIC OBSTRUCTIVE PULMONARY DISEASE from 02/01/2023 in [facility]  Picture Your Plate Total Score on Admission 59      Picture Your Plate Scores: <42 Unhealthy dietary pattern with much room for improvement. 41-50 Dietary pattern unlikely to meet recommendations for good health and room for improvement. 51-60 More healthful dietary pattern, with some room for improvement.  >60 Healthy dietary pattern, although there may be some specific behaviors that could be improved.    Nutrition Goals Re-Evaluation:  Nutrition Goals Re-Evaluation     Row Name 02/01/23 1141 03/03/23 1108           Goals   Current Weight 247 lb 12.8 oz (112.4 kg) 248 lb 7.3 oz (112.7 kg)      Comment A1c 6.7, lipids WNL, AST 35, ALT 34, GFR 43, CR 1.67 no new labs; most recent labs A1c 6.7, lipids WNL, AST 35, ALT 34, GFR 43, CR 1.67      Expected Outcome Ricki presented today with elevated blood pressure (152/88); nursing asked him to bring in his blood pressure log. His wife, Massie Bougie, is also a patient in pulmonary rehab and she does the majority of the grocery shopping and cooking. Will continue to discuss reduction of sodium intake and increased high fiber/high potassium foods to support blood pressure control. They eat a wide variety of foods.Dwright will benefit from participation in pulmonary rehab for nutrition, exercise, and lifestyle changes. Goals in action. Sheikh has excellent knowledge of strategies for blood pressure control including reduction of sodium intake and increased high fiber/high potassium. He has also previously completed nutrition education through the [place]. Garette does report motivation to lose weight to ~217# as recommended by  his doctor; he is down ~2# since starting with our program. He has been working on increasing his physical activity and decreasing portion sizes. His wife remains a good support. Irven will benefit from participation in pulmonary rehab for nutrition, exercise, and lifestyle changes.               Nutrition Goals  Discharge (Final Nutrition Goals Re-Evaluation):  Nutrition Goals Re-Evaluation - 03/03/23 1108       Goals   Current Weight 248 lb 7.3 oz (112.7 kg)    Comment no new labs; most recent labs A1c 6.7, lipids WNL, AST 35, ALT 34, GFR 43, CR 1.67    Expected Outcome Goals in action. Hutson has excellent knowledge of strategies for blood pressure control including reduction of sodium intake and increased high fiber/high potassium. He has also previously completed nutrition education through the [place]. Kahaan does report motivation to lose weight to ~217# as recommended by his doctor; he is down ~2# since starting with our program. He has been working on increasing his physical activity and decreasing portion sizes. His wife remains a good support. Teancum will benefit from participation in pulmonary rehab for nutrition, exercise, and lifestyle changes.             Psychosocial: Target Goals: Acknowledge presence or absence of significant depression and/or stress, maximize coping skills, provide positive support system. Participant is able to verbalize types and ability to use techniques and skills needed for reducing stress and depression.  Initial Review & Psychosocial Screening:  Initial Psych Review & Screening - 01/24/23 1128       Initial Review   Current issues with Current Depression;Current Psychotropic Meds;Current Anxiety/Panic;Current Stress Concerns;Current Sleep Concerns    Source of Stress Concerns Chronic Illness;Family    Comments Pt's is currently under stress due to his brother being recently diagnosed with ALS and also his wife has multiple myeloma. Pt also has  sleep concerns due to sleep apnea and narcolepsy. Pt does see a therapist and is on medications for depression at this time.      Family Dynamics   Good Support System? Yes      Barriers   Psychosocial barriers to participate in program The patient should benefit from training in stress management and relaxation.;Psychosocial barriers identified (see note)      Screening Interventions   Interventions Encouraged to exercise    Expected Outcomes Short Term goal: Utilizing psychosocial counselor, staff and physician to assist with identification of specific Stressors or current issues interfering with healing process. Setting desired goal for each stressor or current issue identified.;Long Term Goal: Stressors or current issues are controlled or eliminated.;Short Term goal: Identification and review with participant of any Quality of Life or Depression concerns found by scoring the questionnaire.;Long Term goal: The participant improves quality of Life and PHQ9 Scores as seen by post scores and/or verbalization of changes             Quality of Life Scores:  Scores of 19 and below usually indicate a poorer quality of life in these areas.  A difference of  2-3 points is a clinically meaningful difference.  A difference of 2-3 points in the total score of the Quality of Life Index has been associated with significant improvement in overall quality of life, self-image, physical symptoms, and general health in studies assessing change in quality of life.  PHQ-9: Review Flowsheet       01/24/2023  Depression screen PHQ 2/9  Decreased Interest 2  Down, Depressed, Hopeless 0  PHQ - 2 Score 2  Altered sleeping 3  Tired, decreased energy 2  Change in appetite 1  Feeling bad or failure about yourself  2  Trouble concentrating 3  Moving slowly or fidgety/restless 1  Suicidal thoughts 0  PHQ-9 Score 14  Difficult doing work/chores Somewhat difficult  Details           Interpretation  of Total Score  Total Score Depression Severity:  1-4 = Minimal depression, 5-9 = Mild depression, 10-14 = Moderate depression, 15-19 = Moderately severe depression, 20-27 = Severe depression   Psychosocial Evaluation and Intervention:  Psychosocial Evaluation - 01/24/23 1131       Psychosocial Evaluation & Interventions   Interventions Stress management education;Relaxation education;Encouraged to exercise with the program and follow exercise prescription    Comments Kyeson is currently stressed over his brother's recent diagnosis of ALS. He's also dealing with his wifes diagnosis of multiple myeloma.    Expected Outcomes For Othel to participate in PR free of any psychosocial barriers or concerns.    Continue Psychosocial Services  Follow up required by staff             Psychosocial Re-Evaluation:  Psychosocial Re-Evaluation     Row Name 01/24/23 1301 02/21/23 0933 03/16/23 1532         Psychosocial Re-Evaluation   Current issues with Current Depression;Current Anxiety/Panic;Current Psychotropic Meds;Current Stress Concerns;Current Sleep Concerns Current Depression;Current Anxiety/Panic;Current Psychotropic Meds;Current Stress Concerns;Current Sleep Concerns Current Depression;Current Anxiety/Panic;Current Psychotropic Meds;Current Stress Concerns;Current Sleep Concerns     Comments No new psychosocial barriers or concerns since orientation on 5/10. Loucas is still processing his brother's ALS diagnosis as well as his wife's multiple myeloma diagnosis. He is uncertain of the future and worries about his brother and his wife. Zecharia feels his mental health is stable and is compliant with taking his psychotropic medications. He also sees a therapist through the [place]. He states that if he needs mental health assistance, he can reach out to the [place]. Traci's BP and HR has been elevated throughout the program. We have reached out to the [place] for assistance, and he has followed up. Benjimen is  still processing his brother's ALS diagnosis. His wife's cancer is in remission so he feels grateful for that. Edrian feels his mental health is stable and is compliant with taking his psychotropic medications. He also sees a therapist through the [place]. He states that if he needs mental health assistance, he can reach out to the [place].     Expected Outcomes For pt to partcipate in PR free of any psychosocial barriers or concerns. For Levan to continue to partcipate in PR free of any psychosocial barriers or concerns. For Damarri to continue to partcipate in PR free of any psychosocial barriers or concerns.     Interventions Encouraged to attend Pulmonary Rehabilitation for the exercise;Stress management education Encouraged to attend Pulmonary Rehabilitation for the exercise;Stress management education;Relaxation education Encouraged to attend Pulmonary Rehabilitation for the exercise;Stress management education     Continue Psychosocial Services  Follow up required by staff Follow up required by staff Follow up required by staff              Psychosocial Discharge (Final Psychosocial Re-Evaluation):  Psychosocial Re-Evaluation - 03/16/23 1532       Psychosocial Re-Evaluation   Current issues with Current Depression;Current Anxiety/Panic;Current Psychotropic Meds;Current Stress Concerns;Current Sleep Concerns    Comments Cade is still processing his brother's ALS diagnosis. His wife's cancer is in remission so he feels grateful for that. Ekrem feels his mental health is stable and is compliant with taking his psychotropic medications. He also sees a therapist through the [place]. He states that if he needs mental health assistance, he can reach out to the [place].    Expected Outcomes For Domanick to  continue to partcipate in PR free of any psychosocial barriers or concerns.    Interventions Encouraged to attend Pulmonary Rehabilitation for the exercise;Stress management education    Continue Psychosocial  Services  Follow up required by staff             Education: Education Goals: Education classes will be provided on a weekly basis, covering required topics. Participant will state understanding/return demonstration of topics presented.  Learning Barriers/Preferences:  Learning Barriers/Preferences - 01/24/23 1132       Learning Barriers/Preferences   Learning Barriers Sight;Reading   pt is dyslexic   Learning Preferences Skilled Demonstration             Education Topics: Introduction to Pulmonary Rehab Group instruction provided by PowerPoint, verbal discussion, and written material to support subject matter. Instructor reviews what Pulmonary Rehab is, the purpose of the program, and how patients are referred.     Know Your Numbers Group instruction that is supported by a PowerPoint presentation. Instructor discusses importance of knowing and understanding resting, exercise, and post-exercise oxygen saturation, heart rate, and blood pressure. Oxygen saturation, heart rate, blood pressure, rating of perceived exertion, and dyspnea are reviewed along with a normal range for these values.    Exercise for the Pulmonary Patient Group instruction that is supported by a PowerPoint presentation. Instructor discusses benefits of exercise, core components of exercise, frequency, duration, and intensity of an exercise routine, importance of utilizing pulse oximetry during exercise, safety while exercising, and options of places to exercise outside of rehab.  Flowsheet Row PULMONARY REHAB CHRONIC OBSTRUCTIVE PULMONARY DISEASE from 02/10/2023 in [facility]  Date 02/10/23  Educator EP  Instruction Review Code 1- Verbalizes Understanding          MET Level  Group instruction provided by PowerPoint, verbal discussion, and written material to support subject matter. Instructor reviews what METs are and how to increase METs.     Pulmonary Medications Verbally interactive group education provided by instructor with focus on inhaled medications and proper administration. Flowsheet Row PULMONARY REHAB CHRONIC OBSTRUCTIVE PULMONARY DISEASE from 02/03/2023 in [facility], V[facility], [facility]  Date 02/03/23  Educator RT  Instruction Review Code 1- Verbalizes Understanding       Anatomy and Physiology of the Respiratory System Group instruction provided by PowerPoint, verbal discussion, and written material to support subject matter. Instructor reviews respiratory cycle and anatomical components of the respiratory system and their functions. Instructor also reviews differences in obstructive and restrictive respiratory diseases with examples of each.    Oxygen Safety Group instruction provided by PowerPoint, verbal discussion, and written material to support subject matter. There is an overview of "What is Oxygen" and "Why do we need it".  Instructor also reviews how to create a safe environment for oxygen use, the importance of using oxygen as prescribed, and the risks of noncompliance. There is a brief discussion on traveling with oxygen and resources the patient may utilize. Flowsheet Row PULMONARY REHAB CHRONIC OBSTRUCTIVE PULMONARY DISEASE from 03/03/2023 in [facility] V[facility], [facility] Lung [facility]  Date 03/03/23  Educator RN  Instruction Review Code 1- Verbalizes Understanding       Oxygen Use Group instruction provided by PowerPoint, verbal discussion, and written material to discuss how supplemental oxygen is prescribed and different types of oxygen supply systems. Resources for more information are provided.  Flowsheet Row PULMONARY REHAB CHRONIC OBSTRUCTIVE  PULMONARY DISEASE from 03/10/2023 in [facility], Vascular, & Lung [facility]  Date 03/10/23  Educator RT  Instruction Review Code 1- Verbalizes  Understanding       Breathing Techniques Group instruction that is supported by demonstration and informational handouts. Instructor discusses the benefits of pursed lip and diaphragmatic breathing and detailed demonstration on how to perform both.  Flowsheet Row PULMONARY REHAB CHRONIC OBSTRUCTIVE PULMONARY DISEASE from 03/17/2023 in [facility] for Heart, Vascular, & Lung Health  Date 03/17/23  Educator RN  Instruction Review Code 1- Verbalizes Understanding        Risk Factor Reduction Group instruction that is supported by a PowerPoint presentation. Instructor discusses the definition of a risk factor, different risk factors for pulmonary disease, and how the heart and lungs work together.   MD Day A group question and answer session with a medical doctor that allows participants to ask questions that relate to their pulmonary disease state.   Nutrition for the Pulmonary Patient Group instruction provided by PowerPoint slides, verbal discussion, and written materials to support subject matter. The instructor gives an explanation and review of healthy diet recommendations, which includes a discussion on weight management, recommendations for fruit and vegetable consumption, as well as protein, fluid, caffeine, fiber, sodium, sugar, and alcohol. Tips for eating when patients are short of breath are discussed.    Other Education Group or individual verbal, written, or video instructions that support the educational goals of the pulmonary rehab program.    Knowledge Questionnaire Score:  Knowledge Questionnaire Score - 02/01/23 1122       Knowledge Questionnaire Score   Post Score --             Core Components/Risk Factors/Patient Goals at Admission:  Personal Goals and Risk Factors at Admission - 01/24/23 1133       Core Components/Risk Factors/Patient Goals on Admission    Weight Management Weight Loss    Improve shortness of breath with  ADL's Yes    Intervention Provide education, individualized exercise plan and daily activity instruction to help decrease symptoms of SOB with activities of daily living.    Expected Outcomes Short Term: Improve cardiorespiratory fitness to achieve a reduction of symptoms when performing ADLs    Hypertension Yes    Intervention Provide education on lifestyle modifcations including regular physical activity/exercise, weight management, moderate sodium restriction and increased consumption of fresh fruit, vegetables, and low fat dairy, alcohol moderation, and smoking cessation.;Monitor prescription use compliance.    Expected Outcomes Short Term: Continued assessment and intervention until BP is < 140/27mm HG in hypertensive participants. < 130/60mm HG in hypertensive participants with diabetes, heart failure or chronic kidney disease.;Long Term: Maintenance of blood pressure at goal levels.    Stress Yes    Intervention Offer individual and/or small group education and counseling on adjustment to heart disease, stress management and health-related lifestyle change. Teach and support self-help strategies.;Refer participants experiencing significant psychosocial distress to appropriate mental health specialists for further evaluation and treatment. When possible, include family members and significant others in education/counseling sessions.    Expected Outcomes Short Term: Participant demonstrates changes in health-related behavior, relaxation and other stress management skills, ability to obtain effective social support, and compliance with psychotropic medications if prescribed.;Long Term: Emotional wellbeing is indicated by absence of clinically significant psychosocial distress or social isolation.             Core Components/Risk Factors/Patient Goals Review:   Goals  and Risk Factor Review     Row Name 01/24/23 1302 02/21/23 0945 03/16/23 1534         Core Components/Risk Factors/Patient  Goals Review   Personal Goals Review Weight Management/Obesity;Improve shortness of breath with ADL's;Develop more efficient breathing techniques such as purse lipped breathing and diaphragmatic breathing and practicing self-pacing with activity.;Hypertension Weight Management/Obesity;Improve shortness of breath with ADL's;Develop more efficient breathing techniques such as purse lipped breathing and diaphragmatic breathing and practicing self-pacing with activity.;Hypertension Weight Management/Obesity;Improve shortness of breath with ADL's;Develop more efficient breathing techniques such as purse lipped breathing and diaphragmatic breathing and practicing self-pacing with activity.;Hypertension     Review Pt is scheduled to start PR on 6/18 Goal in progress for weight loss. Haneef's wife is beneficial in assisting him to reduce his sodium intake and eat a wide variety of foods. His wife Massie Bougie, who is also in the program, have been working with our dietician to make changes. Goal progressing on improving his shortness of breath with ADLs. His oxygen saturation has maintained on room air. Goal progressing on developing more efficient breathing techniques such as purse lipped breathing and diaphragmatic breathing; and practicing self-pacing with activity. Kayaan needs to be prompted with he becomes dyspneic. Goal progressing on blood pressure control. Less followed up with his PCP about his high BP and they are going to continue to watch it until his follow up again in 3 months. Henrie is keeping a BP log to take to his doctor's appointment. Bravery tolerates coming to class. He gets discouraged when he can't put forth his full effort due to his high BP and/or HR. Nobel will continue to benefit from participation in PR for nutrition, education, exercise and lifestyle modification. Goal in progress for weight loss.Cahill is working with staff dietician to achieve his weight loss goals. Goal progressing on  improving his shortness of breath with ADLs. His oxygen saturation has maintained on room air. Goal met on developing more efficient breathing techniques such as purse lipped breathing and diaphragmatic breathing; and practicing self-pacing with activity. Goal progressing on blood pressure control. Tymel followed up with his PCP about his high BP and they are going to continue to watch it until his follow up again in 3 months. Ladislav is keeping a BP log to take to his doctor's appointment. Josearmando tolerates coming to class. He gets discouraged when he can't put forth his full effort due to his high BP and/or HR. Cainan will continue to benefit from participation in PR for nutrition, education, exercise and lifestyle modification.     Expected Outcomes See admission goals See admission goals See admission goals              Core Components/Risk Factors/Patient Goals at Discharge (Final Review):   Goals and Risk Factor Review - 03/16/23 1534       Core Components/Risk Factors/Patient Goals Review   Personal Goals Review Weight Management/Obesity;Improve shortness of breath with ADL's;Develop more efficient breathing techniques such as purse lipped breathing and diaphragmatic breathing and practicing self-pacing with activity.;Hypertension    Review Goal in progress for weight loss.Jerit is working with staff dietician to achieve his weight loss goals. Goal progressing on improving his shortness of breath with ADLs. His oxygen saturation has maintained on room air. Goal met on developing more efficient breathing techniques such as purse lipped breathing and diaphragmatic breathing; and practicing self-pacing with activity. Goal progressing on blood pressure control. Marcel followed up with his PCP about his high BP and they  are going to continue to watch it until his follow up again in 3 months. Rahn is keeping a BP log to take to his doctor's appointment. Eirik tolerates coming to class. He gets  discouraged when he can't put forth his full effort due to his high BP and/or HR. Jordie will continue to benefit from participation in PR for nutrition, education, exercise and lifestyle modification.    Expected Outcomes See admission goals             ITP Comments:Pt is making expected progress toward Pulmonary Rehab goals after completing 12 sessions. Recommend continued exercise, life style modification, education, and utilization of breathing techniques to increase stamina and strength, while also decreasing shortness of breath with exertion.  Dr. Mechele Collin is Medical Director for Pulmonary Rehab at [facility].     Comments: Dr. Mechele Collin is Medical Director for Pulmonary Rehab at [facility].

## 2023-03-23 NOTE — Telephone Encounter (Signed)
Okay to liberalize target heart rate during exercise.  May allow to exercise with heart rate up to 130

## 2023-03-24 ENCOUNTER — Encounter (HOSPITAL_COMMUNITY)
Admission: RE | Admit: 2023-03-24 | Discharge: 2023-03-24 | Disposition: A | Discharge status: Home / Self Care | Payer: No Typology Code available for payment source | Source: Ambulatory Visit | Attending: Pulmonary Disease | Admitting: Pulmonary Disease

## 2023-03-24 DIAGNOSIS — J449 Chronic obstructive pulmonary disease, unspecified: Secondary | ICD-10-CM

## 2023-03-24 NOTE — Progress Notes (Signed)
Daily Session Note  Patient Details  Name: [name] MRN: [id number] Date of Birth: 22-Jul-1951 Referring Provider:   [name] Pulmonary Rehab Walk Test from 01/24/2023 in [facility] for Heart, Vascular, & Lung Health  Referring Provider [name]       Encounter Date: 03/24/2023  Check In:  Session Check In - 03/24/23 1041       Check-In   Supervising physician immediately available to respond to emergencies [facility] MD immediately available    Physician(s) Joni Reining, NP    Location [facility]    Staff Present Raford Pitcher, MS, ACSM-CEP, Exercise Physiologist;Randi Dionisio Paschal, ACSM-CEP, Exercise Physiologist;Mary Gerre Scull, RN, BSN;Casey Glenetta Borg, MS, Exercise Physiologist    Virtual Visit No    Medication changes reported     No    Fall or balance concerns reported    No    Tobacco Cessation No Change    Warm-up and Cool-down Performed as group-led instruction    Resistance Training Performed Yes    VAD Patient? No    PAD/SET Patient? No      Pain Assessment   Currently in Pain? No/denies    Pain Score 0-No pain    Pain Onset More than a month ago    Multiple Pain Sites No             Capillary Blood Glucose: No results found for this or any previous visit (from the past 24 hour(s)).    Social History   Tobacco Use  Smoking Status Never  Smokeless Tobacco Never    Goals Met:  Proper associated with RPD/PD & O2 Sat Independence with exercise equipment Exercise tolerated well No report of concerns or symptoms today Strength training completed today  Goals Unmet:  Not Applicable  Comments: Service time is from 1012 to 1141.    Dr. Mechele Collin is Medical Director for Pulmonary Rehab at [facility].

## 2023-03-29 ENCOUNTER — Encounter (HOSPITAL_COMMUNITY)
Admission: RE | Admit: 2023-03-29 | Discharge: 2023-03-29 | Disposition: A | Discharge status: Home / Self Care | Payer: No Typology Code available for payment source | Source: Ambulatory Visit | Attending: Pulmonary Disease | Admitting: Pulmonary Disease

## 2023-03-29 DIAGNOSIS — J449 Chronic obstructive pulmonary disease, unspecified: Secondary | ICD-10-CM

## 2023-03-29 NOTE — Progress Notes (Signed)
Daily Session Note  Patient Details  Name: [name] MRN: [id number] Date of Birth: [birth year] Referring Provider:   [name] Pulmonary Rehab Walk Test [facility] for Heart, Vascular, & Lung Health  Referring Provider [name]       Encounter Date: 03/29/2023  Check In:  Session Check In - 03/29/23 1103       Check-In   Supervising physician immediately available to respond to emergencies [facility] MD immediately available    Physician(s) Jari Favre, PA    Location [facility]    Staff Present Raford Pitcher, MS, ACSM-CEP, Exercise Physiologist;Randi Dionisio Paschal, ACSM-CEP, Exercise Physiologist;Raford Brissett Gerre Scull, RN, Fuller Plan, RT    Virtual Visit No    Medication changes reported     No    Fall or balance concerns reported    No    Tobacco Cessation No Change    Warm-up and Cool-down Performed as group-led instruction    Resistance Training Performed Yes    VAD Patient? No    PAD/SET Patient? No      Pain Assessment   Currently in Pain? No/denies    Multiple Pain Sites No             Capillary Blood Glucose: No results found for this or any previous visit (from the past 24 hour(s)).    Social History   Tobacco Use  Smoking Status Never  Smokeless Tobacco Never    Goals Met:  Improved SOB with ADL's Exercise tolerated well No report of concerns or symptoms today Strength training completed today  Goals Unmet:  Not Applicable  Comments: Service time is from 1004 to 1133    Dr. Mechele Collin is Medical Director for Pulmonary Rehab at [facility].

## 2023-03-31 ENCOUNTER — Encounter (HOSPITAL_COMMUNITY)
Admission: RE | Admit: 2023-03-31 | Discharge: 2023-03-31 | Disposition: A | Discharge status: Home / Self Care | Payer: No Typology Code available for payment source | Source: Ambulatory Visit | Attending: Pulmonary Disease | Admitting: Pulmonary Disease

## 2023-03-31 DIAGNOSIS — J449 Chronic obstructive pulmonary disease, unspecified: Secondary | ICD-10-CM | POA: Diagnosis not present

## 2023-03-31 NOTE — Progress Notes (Signed)
Daily Session Note  Patient Details  Name: [name] MRN: [id number] Date of Birth: [birth year] Referring Provider:   [name] Pulmonary Rehab Walk Test from 01/24/2023 in [facility] for Heart, Vascular, & Lung Health  Referring Provider [name]       Encounter Date: 03/31/2023  Check In:  Session Check In - 03/31/23 1135       Check-In   Supervising physician immediately available to respond to emergencies [facility] MD immediately available    Physician(s) Tim Fabian, NP    Location [facility]    Staff Present Tim Pitcher, MS, ACSM-CEP, Exercise Physiologist;[name], ACSM-CEP, Exercise Physiologist;Tim Gerre Scull, RN, BSN;[name], RT;[name] BS, ACSM-CEP, Exercise Physiologist;[name], RD, LDN    Virtual Visit No    Medication changes reported     No    Fall or balance concerns reported    No    Tobacco Cessation No Change    Warm-up and Cool-down Performed as group-led instruction    Resistance Training Performed Yes    VAD Patient? No    PAD/SET Patient? No      Pain Assessment   Currently in Pain? No/denies             Capillary Blood Glucose: No results found for this or any previous visit (from the past 24 hour(s)).    Social History   Tobacco Use  Smoking Status Never  Smokeless Tobacco Never    Goals Met:  Proper associated with RPD/PD & O2 Sat Independence with exercise equipment Exercise tolerated well No report of concerns or symptoms today Strength training completed today  Goals Unmet:  Not Applicable  Comments: Service time is from 1012 to 1141.    Dr. Mechele Odonnell is Medical Director for Pulmonary Rehab at [facility].

## 2023-04-05 ENCOUNTER — Encounter (HOSPITAL_COMMUNITY)
Admission: RE | Admit: 2023-04-05 | Discharge: 2023-04-05 | Disposition: A | Discharge status: Home / Self Care | Payer: No Typology Code available for payment source | Source: Ambulatory Visit | Attending: Pulmonary Disease | Admitting: Pulmonary Disease

## 2023-04-05 VITALS — Wt 247.4 lb

## 2023-04-05 DIAGNOSIS — J449 Chronic obstructive pulmonary disease, unspecified: Secondary | ICD-10-CM | POA: Diagnosis not present

## 2023-04-05 NOTE — Progress Notes (Signed)
Daily Session Note  Patient Details  Name: [name] MRN: [id number] Date of Birth: February 08, 1951 Referring Provider:   [name] Pulmonary Rehab Walk Test from 01/24/2023 in [facility] for Heart, Vascular, & Lung Health  Referring Provider [name]       Encounter Date: 04/05/2023  Check In:  Session Check In - 04/05/23 1029       Check-In   Supervising physician immediately available to respond to emergencies [facility] MD immediately available    Physician(s) Edd Fabian, NP    [facility]    Staff Present Raford Pitcher, MS, ACSM-CEP, Exercise Physiologist;Mary Gerre Scull, RN, BSN;Samantha Belarus, RD, Debbora Dus, RT    Virtual Visit No    Medication changes reported     No    Fall or balance concerns reported    No    Tobacco Cessation No Change    Warm-up and Cool-down Performed as group-led instruction    Resistance Training Performed Yes    VAD Patient? No    PAD/SET Patient? No      Pain Assessment   Currently in Pain? No/denies    Multiple Pain Sites No             Capillary Blood Glucose: No results found for this or any previous visit (from the past 24 hour(s)).   Exercise Prescription Changes - 04/05/23 1200       Response to Exercise   Blood Pressure (Admit) 140/52    Blood Pressure (Exercise) 180/66    Blood Pressure (Exit) 150/58    Heart Rate (Admit) 100 bpm    Heart Rate (Exercise) 129 bpm    Heart Rate (Exit) 102 bpm    Oxygen Saturation (Admit) 98 %    Oxygen Saturation (Exercise) 94 %    Oxygen Saturation (Exit) 97 %    Rating of Perceived Exertion (Exercise) 13    Perceived Dyspnea (Exercise) 2    Duration Continue with 30 min of aerobic exercise without signs/symptoms of physical distress.    Intensity THRR unchanged      Resistance Training   Training Prescription Yes    Weight blue bands    Reps 10-15    Time 10 Minutes      Recumbant Bike   Level 5    Minutes 15    METs  3.4      Recumbant Elliptical   Level 5    Minutes 15    METs 3.5             Social History   Tobacco Use  Smoking Status Never  Smokeless Tobacco Never    Goals Met:  Proper associated with RPD/PD & O2 Sat Independence with exercise equipment Exercise tolerated well No report of concerns or symptoms today Strength training completed today  Goals Unmet:  Not Applicable  Comments: Service time is from 1014 to 1140.    Dr. Mechele Collin is Medical Director for Pulmonary Rehab at [facility].

## 2023-04-07 ENCOUNTER — Encounter (HOSPITAL_COMMUNITY)
Admission: RE | Admit: 2023-04-07 | Discharge: 2023-04-07 | Disposition: A | Discharge status: Home / Self Care | Payer: No Typology Code available for payment source | Source: Ambulatory Visit | Attending: Pulmonary Disease | Admitting: Pulmonary Disease

## 2023-04-07 DIAGNOSIS — J449 Chronic obstructive pulmonary disease, unspecified: Secondary | ICD-10-CM

## 2023-04-07 LAB — GLUCOSE, CAPILLARY: Glucose-Capillary: 88 mg/dL (ref 70–99)

## 2023-04-07 NOTE — Progress Notes (Signed)
Daily Session Note  Patient Details  Name: [name] MRN: [id number] Date of Birth: 06-23-1951 Referring Provider:   [name] Pulmonary Rehab Walk Test from 01/24/2023 in [facility] for Heart, Vascular, & Lung Health  Referring Provider [name]       Encounter Date: 04/07/2023  Check In:  Session Check In - 04/07/23 1025       Check-In   Supervising physician immediately available to respond to emergencies [facility] MD immediately available    Physician(s) Robin Searing, NP    Location [facility]    Staff Present Raford Pitcher, MS, ACSM-CEP, Exercise Physiologist;Mary Gerre Scull, RN, BSN;Samantha Belarus, RD, Debbora Dus, RT    Virtual Visit No    Medication changes reported     No    Fall or balance concerns reported    No    Tobacco Cessation No Change    Warm-up and Cool-down Performed as group-led instruction    Resistance Training Performed Yes    VAD Patient? No    PAD/SET Patient? No      Pain Assessment   Currently in Pain? No/denies    Multiple Pain Sites No             Capillary Blood Glucose: Results for orders placed or performed during the hospital encounter of 04/07/23 (from the past 24 hour(s))  Glucose, capillary     Status: None   Collection Time: 04/07/23 11:16 AM  Result Value Ref Range   Glucose-Capillary 88 70 - 99 mg/dL      Social History   Tobacco Use  Smoking Status Never  Smokeless Tobacco Never    Goals Met:  Proper associated with RPD/PD & O2 Sat Independence with exercise equipment Exercise tolerated well No report of concerns or symptoms today Strength training completed today  Goals Unmet:  Not Applicable  Comments: Service time is from 1007 to 1135.    Dr. Mechele Collin is Medical Director for Pulmonary Rehab at [facility].

## 2023-04-07 NOTE — Progress Notes (Signed)
Home Exercise Prescription I have reviewed a Home Exercise Prescription with [name]. [name] is not currently doing aerobic exercise at home. I encouraged him to walk, cycle, or attend a gym 2 non-rehab days/wk for 30 min/day. I recommended exercising with his spouse since the exercises on a regular basis. He agreed with my recommendations. [name] does have access to a fitness facility and other exercise places. [name] seems motivated to exercise and improve his functional capacity. The patient stated that their goals were to lose weight. We reviewed exercise guidelines, target heart rate during exercise, RPE Scale, weather conditions, endpoints for exercise, warmup and cool down. The patient is encouraged to come to me with any questions. I will continue to follow up with the patient to assist them with progression and safety. Spent 15 min with patient discussing home exercise plan and goals  Joya San, MS, ACSM-CEP 04/07/2023 3:41 PM

## 2023-04-12 ENCOUNTER — Ambulatory Visit (INDEPENDENT_AMBULATORY_CARE_PROVIDER_SITE_OTHER): Payer: No Typology Code available for payment source | Admitting: Physician Assistant

## 2023-04-12 ENCOUNTER — Encounter (HOSPITAL_COMMUNITY)
Admission: RE | Admit: 2023-04-12 | Discharge: 2023-04-12 | Disposition: A | Discharge status: Home / Self Care | Payer: No Typology Code available for payment source | Source: Ambulatory Visit | Attending: Pulmonary Disease

## 2023-04-12 VITALS — BP 126/69 | HR 109 | Resp 20 | Ht 73.5 in | Wt 244.0 lb

## 2023-04-12 DIAGNOSIS — J449 Chronic obstructive pulmonary disease, unspecified: Secondary | ICD-10-CM

## 2023-04-12 DIAGNOSIS — G3184 Mild cognitive impairment, so stated: Secondary | ICD-10-CM | POA: Diagnosis not present

## 2023-04-12 DIAGNOSIS — F819 Developmental disorder of scholastic skills, unspecified: Secondary | ICD-10-CM

## 2023-04-12 NOTE — Progress Notes (Signed)
Assessment/Plan:   Memory difficulty of multiple etiologies Learning disability  [name] is a very pleasant 72 y.o. RH male with a history of hypertension, hyperlipidemia, OSA with a history of narcolepsy, compliant with BiPAP, GERD, microcytic anemia, ADD per patient report, diabetes with neuropathy, DJD, vitamin D deficiency, hypothyroidism, history of pituitary edema treated with bromocriptine, PTSD, major depressive disorder, anxiety followed by [facility] and psychotherapy,  presenting today in follow-up for evaluation of memory concerns.  He had neuropsychological evaluation in March 2024, which concluded that his memory difficulties were of multiple etiologies, but of neurodegenerative etiology "taken together, the combination of a reading/verbal based learning disability and severe psychiatric distress can certainly explain primary patterns of variability across the current neuropsychological evaluation. Difficulties could additionally be exacerbated by his numerous medical ailments (e.g., hypertension, coronary artery disease, type II diabetes, hypothyroidism, hyperlipidemia), chronic pain, and polypharmacy. While a prior [facility]-based neurologist diagnosed him with memory loss and concerns for Alzheimer's disease back in 2015, this would appear an erroneous diagnosis ".   In today's visit, the patient reports that he is "feeling better, and he is making changes to his life.  His mood is better controlled, especially after learning that Alzheimer's disease or other neurodegenerative disease is not present during his last testing.  Recommendations:   Repeat neuropsych evaluation between 1 and 2 years to assess the trajectory of future cognitive decline should it occur and to aid in diagnostic clarity Follow-up in 1 year No indication for antidementia medication at this time. Monitor driving Continue management for sleep disorder, COPD, OSA with BiPAP as per pulmonology Recommend  good control of cardiovascular risk factors Continue to control mood as per PCP and psychiatry    Subjective:   This patient is accompanied in the office by his wife who supplements the history. Previous records as well as any outside records available were reviewed prior to todays visit.   Patient was last seen on February 2024     Any changes in memory since last visit? " About the same".  He may forget recent information recent conversations, but not worse than prior. repeats oneself?  "This is better than before" Disoriented when walking into a room?  Patient denies except occasionally not remembering what patient came to the room for Leaving objects in unusual places?  Patient denies   Wandering behavior?   denies   Any personality changes since last visit? Better controlled  Any worsening depression?:"Some days better than others, trying to balance depression and anxiety" Hallucinations or paranoia?  "Not recently, not since they changed medicines ".  Seizures?   denies    Any sleep changes? Sleeps well , Reports vivid dreams but not sleeptalking or other REM behavior or sleepwalking since changing the medications (Bupropion)    Sleep apnea? Endorsed, uses BiPAP   Any hygiene concerns?   denies   Independent of bathing and dressing?  Endorsed  Does the patient needs help with medications? Patient is in charge   Who is in charge of the finances?  Patient is in charge     Any changes in appetite?  Denies.     Patient have trouble swallowing?  Denies.   Does the patient cook?  Any kitchen accidents such as leaving the stove on? denies   Any headaches?    denies   Vision changes? denies Chronic pain?  denies   Ambulates with difficulty?  denies    Recent falls or head injuries?    denies  Unilateral weakness, numbness or tingling? denies   Any tremors?  denies   Any anosmia?    denies   Any incontinence of urine?  denies   Any bowel dysfunction?  denies      Patient lives  with wife  Does the patient drive? Yes, denies any issues    Initial evaluation 08/20/2022 How long did patient have memory difficulties?  He has been diagnosed with MCI" likely due to Alzheimer's Disease " ( per Neuropsych evaluation at least dating back to 2015)- but his wife reports that this may be longer about 2013 when he began forgetting recent conversations or events, asking the same question and repeating a story.  Wife asks him to repeat things, he may be experiencing disorganized thoughts. "He uses [place] to communicate". "A caregiver has to help him get organized with things around the house more often"-she says. To what he answers "I have been always been disorganized" . Apparently, he has been on disability because these cognitive and behavioral issues were affecting his work "was having problems at [employer] office, had a breakdown".   Disoriented when walking into a room?  Patient denies. "He has a man cave where he is free to do anything he wants to do without disorganizing the whole house".   Leaving objects in unusual places?  Endorsed by his wife.   Wandering behavior? denies   Any personality changes since last visit?  He is very stressed out when she overprotects him.  She had surgery and was stressful for him; he was "lashing out to anybody, because everyone was trying to tell me what to do".  He has a  psychiatrist, and a psychotherapist whom he sees every 3 months  Any worsening depression?:  Patient is more depressed recently after his brother was diagnosed with Doreatha Martin disease and also he is experiencing stress due to wife having a diagnosis of multiple myeloma and Lupus    Hallucinations or paranoia? "For at least 50 years".  "When I am very tired, I see cars, bicycle,  and money. " "I know they are not there"-he says  Seizures?   denies    Any sleep changes?  Sleeps w BiPAP and takes meds for mental health and "sleep hard about 4 h a night" "Nice vivid  dreams", Denies REM behavior or sleepwalking. Takes melatonin and Seroquel.  Sleep apnea? BiPAP , compliant Any hygiene concerns?  denies   Independent of bathing and dressing?  Endorsed  Does the patient need help with medications? Wife is in charge   Who is in charge of the finances?  Wife is in charge     Any changes in appetite?   denies     Patient have trouble swallowing?  denies   Does the patient cook? Wife since "I got booted out because of the COPD and the neuropathy and I drop heavy things"-he says   Any headaches?  denies   Chronic back pain? Endorsed, sees Dr. Mikal Plane, NS, and attends pain clinic. Ambulates with difficulty?  Uses a cane to ambulate and ankle braces for stability  Recent falls or head injuries?  denies     Unilateral weakness, numbness or tingling?He has known neuropathy, scheduled for [facility] 09/08/22 Dr Mikal Plane.  Any tremors?"On the R hand he has mil tremors, chronic.    Any anosmia?  denies   Any incontinence of urine?  denies   Any bowel dysfunction?    denies      Patient lives  with wife History of heavy alcohol intake? denies   History of heavy tobacco use?   denies   Family history of dementia?     Mother had AD, GrandUncle (paternal) AD  Dose patient drive? No issues' " he does not like traffic, he does not like to go outside of what he knows, sense of direction sometimes is an issue"   Neuropsych evaluation 10/27/2022 Briefly, results suggested an isolated impairment surrounding phonemic fluency. Further performance variability was exhibited across processing speed and executive functioning. While he did have trouble differentiating between words across a list-based recognition task, performances across all other aspects of memory testing were appropriate. The etiology for ongoing dysfunction is multifactorial in nature. As outlined in old records provided by his wife, concerns were expressed surrounding an undiagnosed learning disability as far back as the mid  to late 1990s. Testing later revealed this to be true, describing an unspecified learning disability favoring trouble with reading and reading comprehension. Previous records also suggest concerns surrounding severe depressive experiences dating back to 1979, as well as post-traumatic stress disorder (PTSD) formally diagnosed in 2016 (but likely present for a far longer duration of time). Taken together, the combination of a reading/verbal based learning disability and severe psychiatric distress can certainly explain primary patterns of variability across the current neuropsychological evaluation. Difficulties could additionally be exacerbated by his numerous medical ailments (e.g., hypertension, coronary artery disease, type II diabetes, hypothyroidism, hyperlipidemia), chronic pain, and polypharmacy. While a prior [facility]-based neurologist diagnosed him with memory loss and concerns for Alzheimer's disease back in 2015, this would appear an erroneous diagnosis.     Past Medical History:  Diagnosis Date   Abnormal CT scan of lung 07/23/2021   Abnormality of gait    Allergic rhinitis due to pollen 11/25/2010   Bilateral primary osteoarthritis of hip 12/12/2019   Carpal tunnel syndrome on right    Contact with and (suspected) exposure to asbestos 07/23/2021   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   Hyperlipidemia    Hypothyroidism    Klippel-Feil sequence    Learning disability, unspecified 1997   Reading/reading comprehension   Left inguinal hernia     Low back pain 12/12/2019   Lumbar spondylosis    Major depressive disorder    Mild cognitive impairment 10/18/2022   Nonspecific abnormal electrocardiogram (ECG) (EKG)    Obstructive sleep apnea 12/15/2015   Osteoarthritis    Osteoporosis    Pain in joints of right hand    Peripheral neuropathy    Peripheral venous insufficiency    Pituitary abnormality    pituitary edema; takes bromocriptine   PTSD (post-traumatic stress disorder) 11/25/2010   Spinal stenosis of lumbar region 09/14/2018   Type 2 diabetes mellitus without complications 11/25/2010   Vitamin D deficiency    Weakness of left leg    Wears glasses      Past Surgical History:  Procedure Laterality Date   CARDIAC CATHETERIZATION     CARPAL TUNNEL RELEASE Right 10/12/2022   Procedure: Right CARPAL TUNNEL RELEASE;  Surgeon: Franky Macho,  Ronaldo Miyamoto, MD;  Location: [facility] OR;  Service: Neurosurgery;  Laterality: Right;  RM 20   CERVICAL DISC SURGERY     x 2   COLONOSCOPY     HIP ARTHROPLASTY     right   INGUINAL HERNIA REPAIR Left 09/06/2019   Procedure: LAPAROSCOPIC LEFT INGUINAL HERNIA WITH MESH;  Surgeon: Gaynelle Adu, MD;  Location: [facility];  Service: General;  Laterality: Left;   JOINT REPLACEMENT     KNEE ARTHROPLASTY     bilateral   LUMBAR LAMINECTOMY/DECOMPRESSION MICRODISCECTOMY  08/31/2011   Procedure: LUMBAR LAMINECTOMY/DECOMPRESSION MICRODISCECTOMY;  Surgeon: Clydene Fake, MD;  Location: [facility];  Service: Neurosurgery;  Laterality: N/A;  LumbarThree to Lumbar Five Laminectomy, possible Lumbar Four-Five Diskectomy   SHOULDER SURGERY     left   UPPER GI ENDOSCOPY       PREVIOUS MEDICATIONS:   CURRENT MEDICATIONS:  Outpatient Encounter Medications as of 04/12/2023  Medication Sig   albuterol (PROVENTIL) (2.5 MG/3ML) 0.083% nebulizer solution Take 3 mLs (2.5 mg total) by nebulization every 4 (four) hours as needed for wheezing or shortness of breath.   albuterol (VENTOLIN HFA) 108 (90 Base)  MCG/ACT inhaler Inhale 2 puffs into the lungs every 6 (six) hours as needed for wheezing or shortness of breath.   amLODipine (NORVASC) 10 MG tablet Take 10 mg by mouth daily.   aspirin EC 81 MG tablet Take 81 mg by mouth daily. Swallow whole.   azithromycin (ZITHROMAX Z-PAK) 250 MG tablet Take 2 tablets day 1 and then 1 daily for 4 days   b complex vitamins capsule Take 1 capsule by mouth daily.   buPROPion (WELLBUTRIN SR) 150 MG 12 hr tablet Take 150 mg by mouth daily.   carbamide peroxide (DEBROX) 6.5 % OTIC solution Place 5 drops into both ears as needed (ear wax).   Cholecalciferol (VITAMIN D3) 1000 UNITS CAPS Take 1,000 Units by mouth every morning.   empagliflozin (JARDIANCE) 25 MG TABS tablet Take 25 mg by mouth daily. Take 1/2 tablet every morning   FERGON 240 (27 Fe) MG tablet Take 240 mg by mouth daily.   fexofenadine (ALLEGRA) 180 MG tablet Take 180 mg by mouth daily.   fluticasone (FLONASE) 50 MCG/ACT nasal spray Place 1 spray into both nostrils in the morning and at bedtime.   fluticasone-salmeterol (WIXELA INHUB) 250-50 MCG/ACT AEPB Inhale 1 puff into the lungs in the morning and at bedtime.   gabapentin (NEURONTIN) 400 MG capsule Take 400 mg by mouth 3 (three) times daily.   hydrALAZINE (APRESOLINE) 50 MG tablet Take 50 mg by mouth 3 (three) times daily.   hydrOXYzine (ATARAX/VISTARIL) 10 MG tablet Take 20 mg by mouth at bedtime.   losartan (COZAAR) 100 MG tablet Take 100 mg by mouth every evening.   melatonin 3 MG TABS tablet Take 3 mg by mouth at bedtime.   metFORMIN (GLUCOPHAGE) 500 MG tablet Take 500 mg by mouth daily with breakfast.   modafinil (PROVIGIL) 100 MG tablet Take 200 mg (2 tablets) in the morning and 100 mg (one tablet) at noon as directed   NON FORMULARY BIPAP at bedtime   omeprazole (PRILOSEC OTC) 20 MG tablet Take 1 tablet (20 mg total) by mouth daily.   PARoxetine (PAXIL) 40 MG tablet Take 40 mg by mouth at bedtime.   pravastatin (PRAVACHOL) 40 MG tablet  Take 40 mg by mouth at bedtime.   Probiotic Product (PROBIOTIC DAILY PO) Take 1 capsule by mouth daily.   QUEtiapine (  SEROQUEL) 25 MG tablet Take 50 mg by mouth at bedtime.   spironolactone (ALDACTONE) 25 MG tablet Take 50 mg by mouth daily.    tamsulosin (FLOMAX) 0.4 MG CAPS capsule Take 0.4 mg by mouth every morning.   Tiotropium Bromide Monohydrate (SPIRIVA RESPIMAT) 2.5 MCG/ACT AERS Inhale 2 puffs into the lungs daily.   triamcinolone ointment (KENALOG) 0.1 % Apply 1 application  topically daily as needed (rash).   Turmeric 400 MG CAPS Take 400 mg by mouth daily.   vitamin E 400 UNIT capsule Take 400 Units by mouth daily.   No facility-administered encounter medications on file as of 04/12/2023.     Objective:     PHYSICAL EXAMINATION:    VITALS:   Vitals:   04/12/23 1128  BP: 126/69  Pulse: (!) 109  Resp: 20  SpO2: 96%  Weight: 244 lb (110.7 kg)  Height: 6' 1.5" (1.867 m)    GEN:  The patient appears stated age and is in NAD. HEENT:  Normocephalic, atraumatic.   Neurological examination:  General: NAD, well-groomed, appears stated age. Orientation: The patient is alert. Oriented to person, place and date Cranial nerves: There is good facial symmetry.The speech is fluent and clear. No aphasia or dysarthria. Fund of knowledge is appropriate. Recent memory impaired and remote memory is normal.  Attention and concentration are normal.  Able to name objects and repeat phrases.  Hearing is intact to conversational tone .  Sensation: Sensation is intact to light touch throughout Motor: Strength is at least antigravity x4. DTR's 2/4 in UE/LE      08/21/2022   10:00 AM  Montreal Cognitive Assessment   Visuospatial/ Executive (0/5) 4  Naming (0/3) 3  Attention: Read list of digits (0/2) 1  Attention: Read list of letters (0/1) 0  Attention: Serial 7 subtraction starting at 100 (0/3) 3  Language: Repeat phrase (0/2) 1  Language : Fluency (0/1) 0  Abstraction (0/2) 1   Delayed Recall (0/5) 5  Orientation (0/6) 6  Total 24  Adjusted Score (based on education) 24        No data to display             Movement examination: Tone: There is normal tone in the UE/LE Abnormal movements:  no tremor.  No myoclonus.  No asterixis.   Coordination:  There is no decremation with RAM's. Normal finger to nose  Gait and Station: The patient has no difficulty arising out of a deep-seated chair without the use of the hands. The patient's stride length is good.  Gait is cautious and narrow.   Thank you for allowing [place] the opportunity to participate in the care of this nice patient. Please do not hesitate to contact us for any questions or concerns.   Total time spent on today's visit was 26 minutes dedicated to this patient today, preparing to see patient, examining the patient, ordering tests and/or medications and counseling the patient, documenting clinical information in the EHR or other health record, independently interpreting results and communicating results to the patient/family, discussing treatment and goals, answering patient's questions and coordinating care.  Cc:  Gwenyth Bender, MD  Marlowe Kays 04/12/2023 12:26 PM

## 2023-04-12 NOTE — Patient Instructions (Addendum)
It was a pleasure to see you today at our office.   Recommendations:  Return in 1 year, August 27 at 11:30     Whom to call:  Memory  decline, memory medications: Call our office [phone]   For psychiatric meds, mood meds: Please have your primary care physician manage these medications.      For assessment of decision of mental capacity and competency:  Call Dr. Erick Blinks, geriatric psychiatrist at [phone]  For guidance in geriatric dementia issues please call [facility] [phone]     If you have any severe symptoms of a stroke, or other severe issues such as confusion,severe chills or fever, etc call 911 or go to the ER as you may need to be evaluated further       RECOMMENDATIONS FOR ALL PATIENTS WITH MEMORY PROBLEMS: 1. Continue to exercise (Recommend 30 minutes of walking everyday, or 3 hours every week) 2. Increase social interactions - continue going to [place] and enjoy social gatherings with friends and family 3. Eat healthy, avoid fried foods and eat more fruits and vegetables 4. Maintain adequate blood pressure, blood sugar, and blood cholesterol level. Reducing the risk of stroke and cardiovascular disease also helps promoting better memory. 5. Avoid stressful situations. Live a simple life and avoid aggravations. Organize your time and prepare for the next day in anticipation. 6. Sleep well, avoid any interruptions of sleep and avoid any distractions in the bedroom that may interfere with adequate sleep quality 7. Avoid sugar, avoid sweets as there is a strong link between excessive sugar intake, diabetes, and cognitive impairment We discussed the Mediterranean diet, which has been shown to help patients reduce the risk of progressive memory disorders and reduces cardiovascular risk. This includes eating fish, eat fruits and green leafy vegetables, nuts like almonds and hazelnuts, walnuts, and also use olive oil. Avoid fast foods and fried  foods as much as possible. Avoid sweets and sugar as sugar use has been linked to worsening of memory function.  There is always a concern of gradual progression of memory problems. If this is the case, then we may need to adjust level of care according to patient needs. Support, both to the patient and caregiver, should then be put into place.    The Alzheimer[employer] Association is here all day, every day for people facing Alzheimer's disease through our free 24/7 Helpline: [phone]. The Helpline provides reliable information and support to all those who need assistance, such as individuals living with memory loss, Alzheimer's or other dementia, caregivers, health care professionals and the public.  Our highly trained and knowledgeable staff can help you with: Understanding memory loss, dementia and Alzheimer's  Medications and other treatment options  General information about aging and brain health  Skills to provide quality care and to find the best care from professionals  Legal, financial and living-arrangement decisions Our Helpline also features: Confidential care consultation provided by master's level clinicians who can help with decision-making support, crisis assistance and education on issues families face every day  Help in a caller's preferred language using our translation service that features more than 200 languages and dialects  Referrals to local community programs, services and ongoing support     FALL PRECAUTIONS: Be cautious when walking. Scan the area for obstacles that may increase the risk of trips and falls. When getting up in the mornings, sit up at the edge of the bed for a few minutes before getting out of bed. Consider elevating  the bed at the head end to avoid drop of blood pressure when getting up. Walk always in a well-lit room (use night lights in the walls). Avoid area rugs or power cords from appliances in the middle of the walkways. Use a walker or a cane if  necessary and consider physical therapy for balance exercise. Get your eyesight checked regularly.  FINANCIAL OVERSIGHT: Supervision, especially oversight when making financial decisions or transactions is also recommended.  HOME SAFETY: Consider the safety of the kitchen when operating appliances like stoves, microwave oven, and blender. Consider having supervision and share cooking responsibilities until no longer able to participate in those. Accidents with firearms and other hazards in the house should be identified and addressed as well.   ABILITY TO BE LEFT ALONE: If patient is unable to contact 911 operator, consider using LifeLine, or when the need is there, arrange for someone to stay with patients. Smoking is a fire hazard, consider supervision or cessation. Risk of wandering should be assessed by caregiver and if detected at any point, supervision and safe proof recommendations should be instituted.  MEDICATION SUPERVISION: Inability to self-administer medication needs to be constantly addressed. Implement a mechanism to ensure safe administration of the medications.   DRIVING: Regarding driving, in patients with progressive memory problems, driving will be impaired. We advise to have someone else do the driving if trouble finding directions or if minor accidents are reported. Independent driving assessment is available to determine safety of driving.   If you are interested in the driving assessment, you can contact the following:  [employer] in [place] [phone]  [facility] [phone]  [facility] [phone] or [phone]      Mediterranean Diet A Mediterranean diet refers to food and lifestyle choices that are based on the traditions of countries located on the [employer]. This way of eating has been shown to help prevent certain conditions and improve outcomes for people who have  chronic diseases, like kidney disease and heart disease. What are tips for following this plan? Lifestyle  Cook and eat meals together with your family, when possible. Drink enough fluid to keep your urine clear or pale yellow. Be physically active every day. This includes: Aerobic exercise like running or swimming. Leisure activities like gardening, walking, or housework. Get 7-8 hours of sleep each night. If recommended by your health care provider, drink red wine in moderation. This means 1 glass a day for nonpregnant women and 2 glasses a day for men. A glass of wine equals 5 oz (150 mL). Reading food labels  Check the serving size of packaged foods. For foods such as rice and pasta, the serving size refers to the amount of cooked product, not dry. Check the total fat in packaged foods. Avoid foods that have saturated fat or trans fats. Check the ingredients list for added sugars, such as corn syrup. Shopping  At the grocery store, buy most of your food from the areas near the walls of the store. This includes: Fresh fruits and vegetables (produce). Grains, beans, nuts, and seeds. Some of these may be available in unpackaged forms or large amounts (in bulk). Fresh seafood. Poultry and eggs. Low-fat dairy products. Buy whole ingredients instead of prepackaged foods. Buy fresh fruits and vegetables in-season from local farmers markets. Buy frozen fruits and vegetables in resealable bags. If you do not have access to quality fresh seafood, buy precooked frozen shrimp or canned fish, such as tuna,  salmon, or sardines. Buy small amounts of raw or cooked vegetables, salads, or olives from the deli or salad bar at your store. Stock your pantry so you always have certain foods on hand, such as olive oil, canned tuna, canned tomatoes, rice, pasta, and beans. Cooking  Cook foods with extra-virgin olive oil instead of using butter or other vegetable oils. Have meat as a side dish, and have  vegetables or grains as your main dish. This means having meat in small portions or adding small amounts of meat to foods like pasta or stew. Use beans or vegetables instead of meat in common dishes like chili or lasagna. Experiment with different cooking methods. Try roasting or broiling vegetables instead of steaming or sauteing them. Add frozen vegetables to soups, stews, pasta, or rice. Add nuts or seeds for added healthy fat at each meal. You can add these to yogurt, salads, or vegetable dishes. Marinate fish or vegetables using olive oil, lemon juice, garlic, and fresh herbs. Meal planning  Plan to eat 1 vegetarian meal one day each week. Try to work up to 2 vegetarian meals, if possible. Eat seafood 2 or more times a week. Have healthy snacks readily available, such as: Vegetable sticks with hummus. Greek yogurt. Fruit and nut trail mix. Eat balanced meals throughout the week. This includes: Fruit: 2-3 servings a day Vegetables: 4-5 servings a day Low-fat dairy: 2 servings a day Fish, poultry, or lean meat: 1 serving a day Beans and legumes: 2 or more servings a week Nuts and seeds: 1-2 servings a day Whole grains: 6-8 servings a day Extra-virgin olive oil: 3-4 servings a day Limit red meat and sweets to only a few servings a month What are my food choices? Mediterranean diet Recommended Grains: Whole-grain pasta. Brown rice. Bulgar wheat. Polenta. Couscous. Whole-wheat bread. Orpah Cobb. Vegetables: Artichokes. Beets. Broccoli. Cabbage. Carrots. Eggplant. Green beans. Chard. Kale. Spinach. Onions. Leeks. Peas. Squash. Tomatoes. Peppers. Radishes. Fruits: Apples. Apricots. Avocado. Berries. Bananas. Cherries. Dates. Figs. Grapes. Lemons. Melon. Oranges. Peaches. Plums. Pomegranate. Meats and other protein foods: Beans. Almonds. Sunflower seeds. Pine nuts. Peanuts. Cod. Salmon. Scallops. Shrimp. Tuna. Tilapia. Clams. Oysters. Eggs. Dairy: Low-fat milk. Cheese. Greek  yogurt. Beverages: Water. Red wine. Herbal tea. Fats and oils: Extra virgin olive oil. Avocado oil. Grape seed oil. Sweets and desserts: [place] yogurt with honey. Baked apples. Poached pears. Trail mix. Seasoning and other foods: Basil. Cilantro. Coriander. Cumin. Mint. Parsley. Sage. Rosemary. Tarragon. Garlic. Oregano. Thyme. Pepper. Balsalmic vinegar. Tahini. Hummus. Tomato sauce. Olives. Mushrooms. Limit these Grains: Prepackaged pasta or rice dishes. Prepackaged cereal with added sugar. Vegetables: Deep fried potatoes (french fries). Fruits: Fruit canned in syrup. Meats and other protein foods: Beef. Pork. Lamb. Poultry with skin. Hot dogs. Tomasa Blase. Dairy: Ice cream. Sour cream. Whole milk. Beverages: Juice. Sugar-sweetened soft drinks. Beer. Liquor and spirits. Fats and oils: Butter. Canola oil. Vegetable oil. Beef fat (tallow). Lard. Sweets and desserts: Cookies. Cakes. Pies. Candy. Seasoning and other foods: Mayonnaise. Premade sauces and marinades. The items listed may not be a complete list. Talk with your dietitian about what dietary choices are right for you. Summary The Mediterranean diet includes both food and lifestyle choices. Eat a variety of fresh fruits and vegetables, beans, nuts, seeds, and whole grains. Limit the amount of red meat and sweets that you eat. Talk with your health care provider about whether it is safe for you to drink red wine in moderation. This means 1 glass a day for nonpregnant women and  2 glasses a day for men. A glass of wine equals 5 oz (150 mL). This information is not intended to replace advice given to you by your health care provider. Make sure you discuss any questions you have with your health care provider. Document Released: 03/25/2016 Document Revised: 04/27/2016 Document Reviewed: 03/25/2016 Elsevier Interactive Patient Education  2017 [employer].   Labs today [address]  [facility] for MRI [phone]

## 2023-04-12 NOTE — Progress Notes (Signed)
Daily Session Note  Patient Details  Name: [name] MRN: [id number] Date of Birth: 06-Aug-1951 Referring Provider:   [name] Pulmonary Rehab Walk Test from 01/24/2023 in [facility]  Referring Provider [name]       Encounter Date: 04/12/2023  Check In:  Session Check In - 04/12/23 1214       Check-In   Supervising physician immediately available to respond to emergencies [facility] MD immediately available    Physician(s) Hadassah Pais, NP    Location [facility]    Staff Present Samantha Belarus, RD, Dutch Gray, RN, BSN;Randi Reeve BS, ACSM-CEP, Exercise Physiologist;Kaylee Earlene Plater, MS, ACSM-CEP, Exercise Physiologist;Whitaker Holderman Katrinka Blazing, RT    Virtual Visit No    Medication changes reported     No    Fall or balance concerns reported    No    Tobacco Cessation No Change    Warm-up and Cool-down Performed as group-led instruction    Resistance Training Performed Yes    VAD Patient? No    PAD/SET Patient? No      Pain Assessment   Currently in Pain? No/denies    Multiple Pain Sites No             Capillary Blood Glucose: No results found for this or any previous visit (from the past 24 hour(s)).    Social History   Tobacco Use  Smoking Status Never  Smokeless Tobacco Never    Goals Met:  Proper associated with RPD/PD & O2 Sat Independence with exercise equipment Exercise tolerated well No report of concerns or symptoms today Strength training completed today  Goals Unmet:  Not Applicable  Comments: Service time is from 1004 to 1105.    Dr. Mechele Collin is Medical Director for Pulmonary Rehab at [facility].

## 2023-04-14 ENCOUNTER — Encounter (HOSPITAL_COMMUNITY): Payer: No Typology Code available for payment source

## 2023-04-19 ENCOUNTER — Encounter (HOSPITAL_COMMUNITY)
Admission: RE | Admit: 2023-04-19 | Discharge: 2023-04-19 | Disposition: A | Discharge status: Home / Self Care | Payer: No Typology Code available for payment source | Source: Ambulatory Visit | Attending: Pulmonary Disease | Admitting: Pulmonary Disease

## 2023-04-19 VITALS — Wt 246.3 lb

## 2023-04-19 DIAGNOSIS — Z5189 Encounter for other specified aftercare: Secondary | ICD-10-CM | POA: Diagnosis not present

## 2023-04-19 DIAGNOSIS — J449 Chronic obstructive pulmonary disease, unspecified: Secondary | ICD-10-CM

## 2023-04-19 NOTE — Progress Notes (Signed)
Daily Session Note  Patient Details  Name: [name] MRN: [id number] Date of Birth: 09/30/1950 Referring Provider:   [name] Pulmonary Rehab Walk Test from 01/24/2023 in [facility]  Referring Provider [name]       Encounter Date: 04/19/2023  Check In:  Session Check In - 04/19/23 1032       Check-In   Supervising physician immediately available to respond to emergencies [facility] MD immediately available    Physician(s) Bernadene Person, NP    Location [facility]    Staff Present Essie Hart, RN, BSN;Randi Idelle Crouch BS, ACSM-CEP, Exercise Physiologist;Yarithza Mink Earlene Plater, MS, ACSM-CEP, Exercise Physiologist;Casey Hermine Messick Belarus, RD, LDN    Virtual Visit No    Medication changes reported     No    Fall or balance concerns reported    No    Tobacco Cessation No Change    Warm-up and Cool-down Performed as group-led instruction    Resistance Training Performed Yes    VAD Patient? No    PAD/SET Patient? No      Pain Assessment   Currently in Pain? No/denies    Multiple Pain Sites No             Capillary Blood Glucose: No results found for this or any previous visit (from the past 24 hour(s)).   Exercise Prescription Changes - 04/19/23 1200       Response to Exercise   Blood Pressure (Admit) 128/50    Blood Pressure (Exercise) 168/60    Blood Pressure (Exit) 122/54    Heart Rate (Admit) 88 bpm    Heart Rate (Exercise) 127 bpm    Heart Rate (Exit) 95 bpm    Oxygen Saturation (Admit) 98 %    Oxygen Saturation (Exercise) 97 %    Oxygen Saturation (Exit) 98 %    Rating of Perceived Exertion (Exercise) 13    Perceived Dyspnea (Exercise) 2    Duration Continue with 30 min of aerobic exercise without signs/symptoms of physical distress.    Intensity THRR unchanged      Resistance Training   Training Prescription Yes    Weight blue bands    Reps 10-15    Time 10 Minutes       Recumbant Bike   Level 5    Minutes 15    METs 3.6      Recumbant Elliptical   Level 5    Minutes 15    METs 3.4             Social History   Tobacco Use  Smoking Status Never  Smokeless Tobacco Never    Goals Met:  Proper associated with RPD/PD & O2 Sat Independence with exercise equipment Exercise tolerated well No report of concerns or symptoms today Strength training completed today  Goals Unmet:  Not Applicable  Comments: Service time is from 1015 to 1145.    Dr. Mechele Collin is Medical Director for Pulmonary Rehab at [facility].

## 2023-04-20 NOTE — Progress Notes (Signed)
Pulmonary Individual Treatment Plan  Patient Details  Name: [name] MRN: [id number] Date of Birth: 04/20/1951 Referring Provider:   [name] Pulmonary Rehab Walk Test from 01/24/2023 in [facility]  Referring Provider [name]       Initial Encounter Date:  Flowsheet Row Pulmonary Rehab Walk Test from 01/24/2023 in [facility]  Date 01/24/23       Visit Diagnosis: Stage 2 moderate COPD by GOLD classification (HCC)  Patient's Home Medications on Admission:   Current Outpatient Medications:    albuterol (PROVENTIL) (2.5 MG/3ML) 0.083% nebulizer solution, Take 3 mLs (2.5 mg total) by nebulization every 4 (four) hours as needed for wheezing or shortness of breath., Disp: 1080 mL, Rfl: 2   albuterol (VENTOLIN HFA) 108 (90 Base) MCG/ACT inhaler, Inhale 2 puffs into the lungs every 6 (six) hours as needed for wheezing or shortness of breath., Disp: 8 g, Rfl: 5   amLODipine (NORVASC) 10 MG tablet, Take 10 mg by mouth daily., Disp: , Rfl:    aspirin EC 81 MG tablet, Take 81 mg by mouth daily. Swallow whole., Disp: , Rfl:    azithromycin (ZITHROMAX Z-PAK) 250 MG tablet, Take 2 tablets day 1 and then 1 daily for 4 days, Disp: 6 each, Rfl: 0   b complex vitamins capsule, Take 1 capsule by mouth daily., Disp: , Rfl:    buPROPion (WELLBUTRIN SR) 150 MG 12 hr tablet, Take 150 mg by mouth daily., Disp: , Rfl:    carbamide peroxide (DEBROX) 6.5 % OTIC solution, Place 5 drops into both ears as needed (ear wax)., Disp: , Rfl:    Cholecalciferol (VITAMIN D3) 1000 UNITS CAPS, Take 1,000 Units by mouth every morning., Disp: , Rfl:    empagliflozin (JARDIANCE) 25 MG TABS tablet, Take 25 mg by mouth daily. Take 1/2 tablet every morning, Disp: , Rfl:    FERGON 240 (27 Fe) MG tablet, Take 240 mg by mouth daily., Disp: , Rfl:    fexofenadine (ALLEGRA) 180 MG tablet, Take 180 mg by  mouth daily., Disp: , Rfl:    fluticasone (FLONASE) 50 MCG/ACT nasal spray, Place 1 spray into both nostrils in the morning and at bedtime., Disp: , Rfl:    fluticasone-salmeterol (WIXELA INHUB) 250-50 MCG/ACT AEPB, Inhale 1 puff into the lungs in the morning and at bedtime., Disp: 1 each, Rfl: 5   gabapentin (NEURONTIN) 400 MG capsule, Take 400 mg by mouth 3 (three) times daily., Disp: , Rfl:    hydrALAZINE (APRESOLINE) 50 MG tablet, Take 50 mg by mouth 3 (three) times daily., Disp: , Rfl:    hydrOXYzine (ATARAX/VISTARIL) 10 MG tablet, Take 20 mg by mouth at bedtime., Disp: , Rfl:    losartan (COZAAR) 100 MG tablet, Take 100 mg by mouth every evening., Disp: , Rfl:    melatonin 3 MG TABS tablet, Take 3 mg by mouth at bedtime., Disp: , Rfl:    metFORMIN (GLUCOPHAGE) 500 MG tablet, Take 500 mg by mouth daily with breakfast., Disp: , Rfl:    modafinil (PROVIGIL) 100 MG tablet, Take 200 mg (2 tablets) in the morning and 100 mg (one tablet) at noon as directed, Disp: 90 tablet, Rfl: 2   NON FORMULARY, BIPAP at bedtime, Disp: , Rfl:    omeprazole (PRILOSEC OTC) 20 MG tablet, Take 1 tablet (20 mg total) by mouth daily., Disp: 30 tablet, Rfl: 2   PARoxetine (PAXIL)  40 MG tablet, Take 40 mg by mouth at bedtime., Disp: , Rfl:    pravastatin (PRAVACHOL) 40 MG tablet, Take 40 mg by mouth at bedtime., Disp: , Rfl:    Probiotic Product (PROBIOTIC DAILY PO), Take 1 capsule by mouth daily., Disp: , Rfl:    QUEtiapine (SEROQUEL) 25 MG tablet, Take 50 mg by mouth at bedtime., Disp: , Rfl:    spironolactone (ALDACTONE) 25 MG tablet, Take 50 mg by mouth daily. , Disp: , Rfl:    tamsulosin (FLOMAX) 0.4 MG CAPS capsule, Take 0.4 mg by mouth every morning., Disp: , Rfl:    Tiotropium Bromide Monohydrate (SPIRIVA RESPIMAT) 2.5 MCG/ACT AERS, Inhale 2 puffs into the lungs daily., Disp: 1 g, Rfl: 5   triamcinolone ointment (KENALOG) 0.1 %, Apply 1 application  topically daily as needed (rash)., Disp: , Rfl:    Turmeric  400 MG CAPS, Take 400 mg by mouth daily., Disp: , Rfl:    vitamin E 400 UNIT capsule, Take 400 Units by mouth daily., Disp: , Rfl:   Past Medical History: Past Medical History:  Diagnosis Date   Abnormal CT scan of lung 07/23/2021   Abnormality of gait    Allergic rhinitis due to pollen 11/25/2010   Bilateral primary osteoarthritis of hip 12/12/2019   Carpal tunnel syndrome on right    Contact with and (suspected) exposure to asbestos 07/23/2021   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   Hyperlipidemia    Hypothyroidism    Klippel-Feil sequence    Learning disability, unspecified 1997   Reading/reading comprehension   Left inguinal hernia    Low back pain 12/12/2019   Lumbar spondylosis    Major depressive disorder    Mild cognitive impairment 10/18/2022   Nonspecific abnormal electrocardiogram (ECG) (EKG)    Obstructive sleep apnea 12/15/2015   Osteoarthritis    Osteoporosis    Pain in joints of right hand    Peripheral neuropathy    Peripheral venous insufficiency    Pituitary abnormality    pituitary edema; takes bromocriptine   PTSD (post-traumatic stress disorder) 11/25/2010   Spinal stenosis of lumbar region 09/14/2018   Type 2 diabetes mellitus without complications 11/25/2010   Vitamin D deficiency    Weakness of left leg    Wears glasses     Tobacco Use: Social History   Tobacco Use  Smoking Status Never  Smokeless Tobacco Never    Labs: Review Flowsheet       Latest Ref Rng & Units 08/06/2008 07/18/2010  09/05/2012  Labs for ITP Cardiac and Pulmonary Rehab  Hemoglobin A1c 4.0 - 6.0 % 6.7 (NOTE)   The ADA recommends the following therapeutic goal for glycemic   control related to Hgb A1C measurement:   Goal of Therapy:   < 7.0% Hgb A1C   Reference: American Diabetes Association: Clinical Practice   Recommendations 2008, Diabetes Care,  2008, 31:(Suppl 1).  - 6.3   TCO2 0 - 100 mmol/L - 30  -    Details  Capillary Blood Glucose: Lab Results  Component Value Date   GLUCAP 88 04/07/2023   GLUCAP 147 (H) 03/22/2023   GLUCAP 158 (H) 03/08/2023   GLUCAP 101 (H) 03/01/2023   GLUCAP 196 (H) 03/01/2023    POCT Glucose     Row Name 01/24/23 1156             POCT Blood Glucose   Pre-Exercise 157 mg/dL                Pulmonary Assessment Scores:  Pulmonary Assessment Scores     Row Name 01/24/23 1153 02/01/23 1122       ADL UCSD   ADL Phase Entry --    SOB Score total 56 --      CAT Score   CAT Score 22 --      mMRC Score   mMRC Score 3 --            UCSD: Self-administered rating of dyspnea associated with activities of daily living (ADLs) 6-point scale (0 = "not at all" to 5 = "maximal or unable to do because of breathlessness")  Scoring Scores range from 0 to 120.  Minimally important difference is 5 units  CAT: CAT can identify the health impairment of COPD patients and is better correlated with disease progression.  CAT has a scoring range of zero to 40. The CAT score is classified into four groups of low (less than 10), medium (10 - 20), high (21-30) and very high (31-40) based on the impact level of disease on health status. A CAT score over 10 suggests significant symptoms.  A worsening CAT score could be explained by an exacerbation, poor medication adherence, poor inhaler technique, or progression of COPD or comorbid conditions.  CAT MCID is 2 points  mMRC: mMRC (Modified Medical Research Council) Dyspnea Scale is used to assess the degree  of baseline functional disability in patients of respiratory disease due to dyspnea. No minimal important difference is established. A decrease in score of 1 point or greater is considered a positive change.   Pulmonary Function Assessment:  Pulmonary Function Assessment - 01/24/23 1213       Breath   Bilateral Breath Sounds Clear    Shortness of Breath Yes             Exercise Target Goals: Exercise Program Goal: Individual exercise prescription set using results from initial 6 min walk test and THRR while considering  patient's activity barriers and safety.   Exercise Prescription Goal: Initial exercise prescription builds to 30-45 minutes a day of aerobic activity, 2-3 days per week.  Home exercise guidelines will be given to patient during program as part of exercise prescription that the participant will acknowledge.  Activity Barriers & Risk Stratification:  Activity Barriers & Cardiac Risk Stratification - 01/24/23 1135       Activity Barriers & Cardiac Risk Stratification   Activity Barriers Muscular Weakness;Arthritis;Deconditioning;Shortness of Breath;Left Knee Replacement;Right Knee Replacement;Right Hip Replacement;Back Problems;Neck/Spine Problems;Balance Concerns    Cardiac Risk Stratification Moderate             6 Minute Walk:  6 Minute Walk     Row Name 01/26/23 0839         6 Minute Walk   Phase Initial     Distance 735 feet     Walk Time 6 minutes     # of Rest Breaks 0     MPH 1.39     METS 2.02  RPE 11     Perceived Dyspnea  1     VO2 Peak 7.05     Symptoms No     Resting HR 58 bpm     Resting BP 164/50     Resting Oxygen Saturation  98 %     Exercise Oxygen Saturation  during 6 min walk 96 %     Max Ex. HR 112 bpm     Max Ex. BP 166/58     2 Minute Post BP 158/58       Interval HR   1 Minute HR 103     2 Minute HR 107     3 Minute HR 107     4 Minute HR 108     5 Minute HR 109     6 Minute HR 112     2 Minute Post HR 65      Interval Heart Rate? Yes       Interval Oxygen   Interval Oxygen? Yes     Baseline Oxygen Saturation % 98 %     1 Minute Oxygen Saturation % 96 %     1 Minute Liters of Oxygen 0 L     2 Minute Oxygen Saturation % 96 %     2 Minute Liters of Oxygen 0 L     3 Minute Oxygen Saturation % 97 %     3 Minute Liters of Oxygen 0 L     4 Minute Oxygen Saturation % 96 %     4 Minute Liters of Oxygen 0 L     5 Minute Oxygen Saturation % 97 %     5 Minute Liters of Oxygen 0 L     6 Minute Oxygen Saturation % 97 %     6 Minute Liters of Oxygen 0 L     2 Minute Post Oxygen Saturation % 98 %     2 Minute Post Liters of Oxygen 0 L              Oxygen Initial Assessment:  Oxygen Initial Assessment - 01/24/23 1137       Home Oxygen   Home Oxygen Device None    Sleep Oxygen Prescription BiPAP    Home Exercise Oxygen Prescription None    Home Resting Oxygen Prescription None      Initial 6 min Walk   Oxygen Used None      Program Oxygen Prescription   Program Oxygen Prescription None      Intervention   Short Term Goals To learn and exhibit compliance with exercise, home and travel O2 prescription;To learn and understand importance of maintaining oxygen saturations>88%;To learn and demonstrate proper use of respiratory medications;To learn and understand importance of monitoring SPO2 with pulse oximeter and demonstrate accurate use of the pulse oximeter.;To learn and demonstrate proper pursed lip breathing techniques or other breathing techniques.     Long  Term Goals Exhibits compliance with exercise, home  and travel O2 prescription;Maintenance of O2 saturations>88%;Compliance with respiratory medication;Verbalizes importance of monitoring SPO2 with pulse oximeter and return demonstration;Exhibits proper breathing techniques, such as pursed lip breathing or other method taught during program session;Demonstrates proper use of MDI's             Oxygen Re-Evaluation:  Oxygen  Re-Evaluation     Row Name 01/25/23 0753 02/18/23 0954 03/11/23 1346 04/14/23 1626       Program Oxygen Prescription   Program Oxygen Prescription None None None None  Home Oxygen   Home Oxygen Device None None None None    Sleep Oxygen Prescription BiPAP BiPAP BiPAP BiPAP    Home Exercise Oxygen Prescription None None None None    Home Resting Oxygen Prescription None None None None      Goals/Expected Outcomes   Short Term Goals To learn and exhibit compliance with exercise, home and travel O2 prescription;To learn and understand importance of maintaining oxygen saturations>88%;To learn and demonstrate proper use of respiratory medications;To learn and understand importance of monitoring SPO2 with pulse oximeter and demonstrate accurate use of the pulse oximeter.;To learn and demonstrate proper pursed lip breathing techniques or other breathing techniques.  To learn and exhibit compliance with exercise, home and travel O2 prescription;To learn and understand importance of maintaining oxygen saturations>88%;To learn and demonstrate proper use of respiratory medications;To learn and understand importance of monitoring SPO2 with pulse oximeter and demonstrate accurate use of the pulse oximeter.;To learn and demonstrate proper pursed lip breathing techniques or other breathing techniques.  To learn and exhibit compliance with exercise, home and travel O2 prescription;To learn and understand importance of maintaining oxygen saturations>88%;To learn and demonstrate proper use of respiratory medications;To learn and understand importance of monitoring SPO2 with pulse oximeter and demonstrate accurate use of the pulse oximeter.;To learn and demonstrate proper pursed lip breathing techniques or other breathing techniques.  To learn and exhibit compliance with exercise, home and travel O2 prescription;To learn and understand importance of maintaining oxygen saturations>88%;To learn and demonstrate proper  use of respiratory medications;To learn and understand importance of monitoring SPO2 with pulse oximeter and demonstrate accurate use of the pulse oximeter.;To learn and demonstrate proper pursed lip breathing techniques or other breathing techniques.     Long  Term Goals Exhibits compliance with exercise, home  and travel O2 prescription;Maintenance of O2 saturations>88%;Compliance with respiratory medication;Verbalizes importance of monitoring SPO2 with pulse oximeter and return demonstration;Exhibits proper breathing techniques, such as pursed lip breathing or other method taught during program session;Demonstrates proper use of MDI's Exhibits compliance with exercise, home  and travel O2 prescription;Maintenance of O2 saturations>88%;Compliance with respiratory medication;Verbalizes importance of monitoring SPO2 with pulse oximeter and return demonstration;Exhibits proper breathing techniques, such as pursed lip breathing or other method taught during program session;Demonstrates proper use of MDI's Exhibits compliance with exercise, home  and travel O2 prescription;Maintenance of O2 saturations>88%;Compliance with respiratory medication;Verbalizes importance of monitoring SPO2 with pulse oximeter and return demonstration;Exhibits proper breathing techniques, such as pursed lip breathing or other method taught during program session;Demonstrates proper use of MDI's Exhibits compliance with exercise, home  and travel O2 prescription;Maintenance of O2 saturations>88%;Compliance with respiratory medication;Verbalizes importance of monitoring SPO2 with pulse oximeter and return demonstration;Exhibits proper breathing techniques, such as pursed lip breathing or other method taught during program session;Demonstrates proper use of MDI's    Goals/Expected Outcomes Compliance and understanding of oxygen saturation monitoring and breathing techniques to decrease shortness of breath. Compliance and understanding of  oxygen saturation monitoring and breathing techniques to decrease shortness of breath. Compliance and understanding of oxygen saturation monitoring and breathing techniques to decrease shortness of breath. Compliance and understanding of oxygen saturation monitoring and breathing techniques to decrease shortness of breath.             Oxygen Discharge (Final Oxygen Re-Evaluation):  Oxygen Re-Evaluation - 04/14/23 1626       Program Oxygen Prescription   Program Oxygen Prescription None      Home Oxygen   Home Oxygen Device None    Sleep  Oxygen Prescription BiPAP    Home Exercise Oxygen Prescription None    Home Resting Oxygen Prescription None      Goals/Expected Outcomes   Short Term Goals To learn and exhibit compliance with exercise, home and travel O2 prescription;To learn and understand importance of maintaining oxygen saturations>88%;To learn and demonstrate proper use of respiratory medications;To learn and understand importance of monitoring SPO2 with pulse oximeter and demonstrate accurate use of the pulse oximeter.;To learn and demonstrate proper pursed lip breathing techniques or other breathing techniques.     Long  Term Goals Exhibits compliance with exercise, home  and travel O2 prescription;Maintenance of O2 saturations>88%;Compliance with respiratory medication;Verbalizes importance of monitoring SPO2 with pulse oximeter and return demonstration;Exhibits proper breathing techniques, such as pursed lip breathing or other method taught during program session;Demonstrates proper use of MDI's    Goals/Expected Outcomes Compliance and understanding of oxygen saturation monitoring and breathing techniques to decrease shortness of breath.             Initial Exercise Prescription:  Initial Exercise Prescription - 01/24/23 1200       Date of Initial Exercise RX and Referring Provider   Date 01/24/23    Referring Provider [name]    Expected Discharge Date 04/21/23       Recumbant Bike   Level 1    RPM 60    Minutes 15    METs 2      Recumbant Elliptical   Level 1    RPM 60    Minutes 15    METs 2      Prescription Details   Frequency (times per week) 2    Duration Progress to 30 minutes of continuous aerobic without signs/symptoms of physical distress      Intensity   THRR 40-80% of Max Heartrate 57-114    Ratings of Perceived Exertion 11-13    Perceived Dyspnea 0-4      Progression   Progression Continue progressive overload as per policy without signs/symptoms or physical distress.      Resistance Training   Training Prescription Yes    Weight blue bands    Reps 10-15             Perform Capillary Blood Glucose checks as needed.  Exercise Prescription Changes:   Exercise Prescription Changes     Row Name 02/08/23 1200 02/15/23 1217 02/22/23 1200 03/08/23 1200 03/22/23 1200     Response to Exercise   Blood Pressure (Admit) 172/50 144/60 -- 154/58 150/56   Blood Pressure (Exercise) 198/64 182/68 -- 170/60 192/68   Blood Pressure (Exit) 142/58 140/50 -- 140/60 150/60   Heart Rate (Admit) 90 bpm 92 bpm -- 97 bpm 87 bpm   Heart Rate (Exercise) 124 bpm 121 bpm -- 121 bpm 129 bpm   Heart Rate (Exit) 96 bpm 95 bpm -- 98 bpm 103 bpm   Oxygen Saturation (Admit) 98 % 98 % -- 98 % 97 %   Oxygen Saturation (Exercise) 95 % 96 % -- 99 % 97 %   Oxygen Saturation (Exit) 97 % 97 % -- 998 % 96 %   Rating of Perceived Exertion (Exercise) 15 12 -- 13 13   Perceived Dyspnea (Exercise) 3 2 -- 2 3   Duration Continue with 30 min of aerobic exercise without signs/symptoms of physical distress. Continue with 30 min of aerobic exercise without signs/symptoms of physical distress. -- Continue with 30 min of aerobic exercise without signs/symptoms of physical distress. Continue with 30 min  of aerobic exercise without signs/symptoms of physical distress.   Intensity THRR unchanged THRR unchanged -- THRR unchanged THRR unchanged     Progression    Average METs -- 2.6 -- -- --     Resistance Training   Training Prescription Yes Yes -- Yes Yes   Weight blue bands blue bands -- blue bands blue bands   Reps 10-15 10-15 -- 10-15 10-15   Time 10 Minutes 10 Minutes -- 10 Minutes 10 Minutes     Recumbant Bike   Level 4 4 -- 3 5   RPM -- 60 -- 60 --   Minutes 15 15 -- 15 15   METs 3 2.8 -- 2.4 3.4     Recumbant Elliptical   Level 4 4 -- 5 5   Minutes 15 15 -- 15 15   METs 2 2.4 -- 2.1 4.5    Row Name 04/05/23 1200 04/19/23 1200           Response to Exercise   Blood Pressure (Admit) 140/52 128/50      Blood Pressure (Exercise) 180/66 168/60      Blood Pressure (Exit) 150/58 122/54      Heart Rate (Admit) 100 bpm 88 bpm      Heart Rate (Exercise) 129 bpm 127 bpm      Heart Rate (Exit) 102 bpm 95 bpm      Oxygen Saturation (Admit) 98 % 98 %      Oxygen Saturation (Exercise) 94 % 97 %      Oxygen Saturation (Exit) 97 % 98 %      Rating of Perceived Exertion (Exercise) 13 13      Perceived Dyspnea (Exercise) 2 2      Duration Continue with 30 min of aerobic exercise without signs/symptoms of physical distress. Continue with 30 min of aerobic exercise without signs/symptoms of physical distress.      Intensity THRR unchanged THRR unchanged        Resistance Training   Training Prescription Yes Yes      Weight blue bands blue bands      Reps 10-15 10-15      Time 10 Minutes 10 Minutes        Recumbant Bike   Level 5 5      Minutes 15 15      METs 3.4 3.6        Recumbant Elliptical   Level 5 5      Minutes 15 15      METs 3.5 3.4               Exercise Comments:   Exercise Comments     Row Name 02/01/23 1127 04/07/23 1536         Exercise Comments Pt completed first day of group exercise. Pt exercised on the recumbent bike, level 3, for 15 min, METs 2.3. He then exercised on the recumbent elliptical for 15 min, level 4, METs 1.8. His BP and HR were elevated therefore had to slow down. Pt performed warm up  and cool down without limitation, including squats. Discussed METs with good reception. Completed home ExRx. Eligha is not currently doing aerobic exercise at home. I encouraged him to walk, cycle, or attend a gym 2 non-rehab days/wk for 30 min/day. I recommended exercising with his spouse since the exercises on a regular basis. He agreed with my recommendations. Alekxander does have access to a fitness facility and other exercise places. Manpreet seems motivated to  exercise and improve his functional capacity.               Exercise Goals and Review:   Exercise Goals     Row Name 01/24/23 1137 01/25/23 0751 02/18/23 0949 03/11/23 1343 04/14/23 1624     Exercise Goals   Increase Physical Activity Yes Yes Yes Yes Yes   Intervention Provide advice, education, support and counseling about physical activity/exercise needs.;Develop an individualized exercise prescription for aerobic and resistive training based on initial evaluation findings, risk stratification, comorbidities and participant's personal goals. Provide advice, education, support and counseling about physical activity/exercise needs.;Develop an individualized exercise prescription for aerobic and resistive training based on initial evaluation findings, risk stratification, comorbidities and participant's personal goals. Provide advice, education, support and counseling about physical activity/exercise needs.;Develop an individualized exercise prescription for aerobic and resistive training based on initial evaluation findings, risk stratification, comorbidities and participant's personal goals. Provide advice, education, support and counseling about physical activity/exercise needs.;Develop an individualized exercise prescription for aerobic and resistive training based on initial evaluation findings, risk stratification, comorbidities and participant's personal goals. Provide advice, education, support and counseling about physical  activity/exercise needs.;Develop an individualized exercise prescription for aerobic and resistive training based on initial evaluation findings, risk stratification, comorbidities and participant's personal goals.   Expected Outcomes Short Term: Attend rehab on a regular basis to increase amount of physical activity.;Long Term: Exercising regularly at least 3-5 days a week.;Long Term: Add in home exercise to make exercise part of routine and to increase amount of physical activity. Short Term: Attend rehab on a regular basis to increase amount of physical activity.;Long Term: Exercising regularly at least 3-5 days a week.;Long Term: Add in home exercise to make exercise part of routine and to increase amount of physical activity. Short Term: Attend rehab on a regular basis to increase amount of physical activity.;Long Term: Exercising regularly at least 3-5 days a week.;Long Term: Add in home exercise to make exercise part of routine and to increase amount of physical activity. Short Term: Attend rehab on a regular basis to increase amount of physical activity.;Long Term: Exercising regularly at least 3-5 days a week.;Long Term: Add in home exercise to make exercise part of routine and to increase amount of physical activity. Short Term: Attend rehab on a regular basis to increase amount of physical activity.;Long Term: Exercising regularly at least 3-5 days a week.;Long Term: Add in home exercise to make exercise part of routine and to increase amount of physical activity.   Increase Strength and Stamina Yes Yes Yes Yes Yes   Intervention Provide advice, education, support and counseling about physical activity/exercise needs.;Develop an individualized exercise prescription for aerobic and resistive training based on initial evaluation findings, risk stratification, comorbidities and participant's personal goals. Provide advice, education, support and counseling about physical activity/exercise needs.;Develop  an individualized exercise prescription for aerobic and resistive training based on initial evaluation findings, risk stratification, comorbidities and participant's personal goals. Provide advice, education, support and counseling about physical activity/exercise needs.;Develop an individualized exercise prescription for aerobic and resistive training based on initial evaluation findings, risk stratification, comorbidities and participant's personal goals. Provide advice, education, support and counseling about physical activity/exercise needs.;Develop an individualized exercise prescription for aerobic and resistive training based on initial evaluation findings, risk stratification, comorbidities and participant's personal goals. Provide advice, education, support and counseling about physical activity/exercise needs.;Develop an individualized exercise prescription for aerobic and resistive training based on initial evaluation findings, risk stratification, comorbidities and participant's personal goals.  Expected Outcomes Short Term: Increase workloads from initial exercise prescription for resistance, speed, and METs.;Short Term: Perform resistance training exercises routinely during rehab and add in resistance training at home;Long Term: Improve cardiorespiratory fitness, muscular endurance and strength as measured by increased METs and functional capacity ( ) Short Term: Increase workloads from initial exercise prescription for resistance, speed, and METs.;Short Term: Perform resistance training exercises routinely during rehab and add in resistance training at home;Long Term: Improve cardiorespiratory fitness, muscular endurance and strength as measured by increased METs and functional capacity ( ) Short Term: Increase workloads from initial exercise prescription for resistance, speed, and METs.;Short Term: Perform resistance training exercises routinely during rehab and add in resistance training at  home;Long Term: Improve cardiorespiratory fitness, muscular endurance and strength as measured by increased METs and functional capacity ( ) Short Term: Increase workloads from initial exercise prescription for resistance, speed, and METs.;Short Term: Perform resistance training exercises routinely during rehab and add in resistance training at home;Long Term: Improve cardiorespiratory fitness, muscular endurance and strength as measured by increased METs and functional capacity ( ) Short Term: Increase workloads from initial exercise prescription for resistance, speed, and METs.;Short Term: Perform resistance training exercises routinely during rehab and add in resistance training at home;Long Term: Improve cardiorespiratory fitness, muscular endurance and strength as measured by increased METs and functional capacity ( )   Able to understand and use rate of perceived exertion (RPE) scale Yes Yes Yes Yes Yes   Intervention Provide education and explanation on how to use RPE scale Provide education and explanation on how to use RPE scale Provide education and explanation on how to use RPE scale Provide education and explanation on how to use RPE scale Provide education and explanation on how to use RPE scale   Expected Outcomes Short Term: Able to use RPE daily in rehab to express subjective intensity level;Long Term:  Able to use RPE to guide intensity level when exercising independently Short Term: Able to use RPE daily in rehab to express subjective intensity level;Long Term:  Able to use RPE to guide intensity level when exercising independently Short Term: Able to use RPE daily in rehab to express subjective intensity level;Long Term:  Able to use RPE to guide intensity level when exercising independently Short Term: Able to use RPE daily in rehab to express subjective intensity level;Long Term:  Able to use RPE to guide intensity level when exercising independently Short Term: Able to use RPE daily  in rehab to express subjective intensity level;Long Term:  Able to use RPE to guide intensity level when exercising independently   Able to understand and use Dyspnea scale Yes Yes Yes Yes Yes   Intervention Provide education and explanation on how to use Dyspnea scale Provide education and explanation on how to use Dyspnea scale Provide education and explanation on how to use Dyspnea scale Provide education and explanation on how to use Dyspnea scale Provide education and explanation on how to use Dyspnea scale   Expected Outcomes Short Term: Able to use Dyspnea scale daily in rehab to express subjective sense of shortness of breath during exertion;Long Term: Able to use Dyspnea scale to guide intensity level when exercising independently Short Term: Able to use Dyspnea scale daily in rehab to express subjective sense of shortness of breath during exertion;Long Term: Able to use Dyspnea scale to guide intensity level when exercising independently Short Term: Able to use Dyspnea scale daily in rehab to express subjective sense of shortness of breath during exertion;Long Term: Able to use  Dyspnea scale to guide intensity level when exercising independently Short Term: Able to use Dyspnea scale daily in rehab to express subjective sense of shortness of breath during exertion;Long Term: Able to use Dyspnea scale to guide intensity level when exercising independently Short Term: Able to use Dyspnea scale daily in rehab to express subjective sense of shortness of breath during exertion;Long Term: Able to use Dyspnea scale to guide intensity level when exercising independently   Knowledge and understanding of Target Heart Rate Range (THRR) Yes Yes Yes Yes Yes   Intervention Provide education and explanation of THRR including how the numbers were predicted and where they are located for reference Provide education and explanation of THRR including how the numbers were predicted and where they are located for reference  Provide education and explanation of THRR including how the numbers were predicted and where they are located for reference Provide education and explanation of THRR including how the numbers were predicted and where they are located for reference Provide education and explanation of THRR including how the numbers were predicted and where they are located for reference   Expected Outcomes Short Term: Able to state/look up THRR;Long Term: Able to use THRR to govern intensity when exercising independently;Short Term: Able to use daily as guideline for intensity in rehab Short Term: Able to state/look up THRR;Long Term: Able to use THRR to govern intensity when exercising independently;Short Term: Able to use daily as guideline for intensity in rehab Short Term: Able to state/look up THRR;Long Term: Able to use THRR to govern intensity when exercising independently;Short Term: Able to use daily as guideline for intensity in rehab Short Term: Able to state/look up THRR;Long Term: Able to use THRR to govern intensity when exercising independently;Short Term: Able to use daily as guideline for intensity in rehab Short Term: Able to state/look up THRR;Long Term: Able to use THRR to govern intensity when exercising independently;Short Term: Able to use daily as guideline for intensity in rehab   Understanding of Exercise Prescription Yes Yes Yes Yes Yes   Intervention Provide education, explanation, and written materials on patient's individual exercise prescription Provide education, explanation, and written materials on patient's individual exercise prescription Provide education, explanation, and written materials on patient's individual exercise prescription Provide education, explanation, and written materials on patient's individual exercise prescription Provide education, explanation, and written materials on patient's individual exercise prescription   Expected Outcomes Short Term: Able to explain program  exercise prescription;Long Term: Able to explain home exercise prescription to exercise independently Short Term: Able to explain program exercise prescription;Long Term: Able to explain home exercise prescription to exercise independently Short Term: Able to explain program exercise prescription;Long Term: Able to explain home exercise prescription to exercise independently Short Term: Able to explain program exercise prescription;Long Term: Able to explain home exercise prescription to exercise independently Short Term: Able to explain program exercise prescription;Long Term: Able to explain home exercise prescription to exercise independently            Exercise Goals Re-Evaluation :  Exercise Goals Re-Evaluation     Row Name 01/25/23 0752 02/18/23 0949 03/11/23 1343 04/14/23 1624       Exercise Goal Re-Evaluation   Exercise Goals Review Increase Physical Activity;Able to understand and use Dyspnea scale;Understanding of Exercise Prescription;Increase Strength and Stamina;Knowledge and understanding of Target Heart Rate Range (THRR);Able to understand and use rate of perceived exertion (RPE) scale Increase Physical Activity;Able to understand and use Dyspnea scale;Understanding of Exercise Prescription;Increase Strength and Stamina;Knowledge and understanding  of Target Heart Rate Range (THRR);Able to understand and use rate of perceived exertion (RPE) scale Increase Physical Activity;Able to understand and use Dyspnea scale;Understanding of Exercise Prescription;Increase Strength and Stamina;Knowledge and understanding of Target Heart Rate Range (THRR);Able to understand and use rate of perceived exertion (RPE) scale Increase Physical Activity;Able to understand and use Dyspnea scale;Understanding of Exercise Prescription;Increase Strength and Stamina;Knowledge and understanding of Target Heart Rate Range (THRR);Able to understand and use rate of perceived exertion (RPE) scale    Comments Pt is  scheduled to begin exercise next week. Will continue to monitor and progress as able. Devon has completed 5 exercise sessions. He exercises for 15 min on the recumbent bike and recumbent elliptical. Pt averages 2.8 METs at level 4 on the recumbent bike and 2.4 MEts at level 4 on the recumbent elliptical. He performs the warmup and cooldown standing without limitations. Moosa greatly increased his workload for both exercise modes on his first day. This caused an elevated BP and drop in his CBG. He was decreased then slowly increased.  He tolerated the slower increase in workload better. Merle seems very motivated to exercise. Will continue to monitor and progress as able. Aloysius has completed 9 exercise sessions. He exercises for 15 min on the recumbent bike and recumbent elliptical. Pt averages 2.8 METs at ;evel 5 on the recumbent bike and 2.2 METs at level 5 on the recumbent elliptical. He performs the warmup and cooldown standing without limitations. Alexiz has increased his workloads for both exercise modes as METs have increased. He BP and CBG are better managed with exercise. He is understanding pacing strategies when exercising. Baljinder seems motivated to exercise and improve his functional capacity. Will continue to monitor and progress as able. Linford has completed 18 exercise sessions. He exercises for 15 min on the recumbent bike and recumbent elliptical. Pt averages 4 METs at level 5 on the recumbent bike and 4 METs at level 5 on the recumbent elliptical. He performs the warmup and cooldown standing without limitations. Veniamin's METs have increased despite not increasing his worklooad. He understands METs more and how to adjust his speed accordingly. We have recently discussed home exercise as Terron is not currenltly exercising at home. He hopes to start exercising with his spouse. Will continue to monitor and progress as able.    Expected Outcomes Through exercise at rehab and home, the patient will  decrease shortness of breath with daily activities and feel confident in carrying out an exercise regimen at home. Through exercise at rehab and home, the patient will decrease shortness of breath with daily activities and feel confident in carrying out an exercise regimen at home. Through exercise at rehab and home, the patient will decrease shortness of breath with daily activities and feel confident in carrying out an exercise regimen at home. Through exercise at rehab and home, the patient will decrease shortness of breath with daily activities and feel confident in carrying out an exercise regimen at home.             Discharge Exercise Prescription (Final Exercise Prescription Changes):  Exercise Prescription Changes - 04/19/23 1200       Response to Exercise   Blood Pressure (Admit) 128/50    Blood Pressure (Exercise) 168/60    Blood Pressure (Exit) 122/54    Heart Rate (Admit) 88 bpm    Heart Rate (Exercise) 127 bpm    Heart Rate (Exit) 95 bpm    Oxygen Saturation (Admit) 98 %  Oxygen Saturation (Exercise) 97 %    Oxygen Saturation (Exit) 98 %    Rating of Perceived Exertion (Exercise) 13    Perceived Dyspnea (Exercise) 2    Duration Continue with 30 min of aerobic exercise without signs/symptoms of physical distress.    Intensity THRR unchanged      Resistance Training   Training Prescription Yes    Weight blue bands    Reps 10-15    Time 10 Minutes      Recumbant Bike   Level 5    Minutes 15    METs 3.6      Recumbant Elliptical   Level 5    Minutes 15    METs 3.4             Nutrition:  Target Goals: Understanding of nutrition guidelines, daily intake of sodium 1500mg , cholesterol 200mg , calories 30% from fat and 7% or less from saturated fats, daily to have 5 or more servings of fruits and vegetables.  Biometrics:  Pre Biometrics - 01/24/23 1258       Pre Biometrics   Grip Strength 28 kg              Nutrition Therapy Plan and  Nutrition Goals:  Nutrition Therapy & Goals - 03/31/23 0911       Nutrition Therapy   Diet Heart Healthy Diet    Drug/Food Interactions Statins/Certain Fruits      Personal Nutrition Goals   Nutrition Goal Patient to improve diet quality by using the plate method as a guide for meal planning to include lean protein/plant protein, fruits, vegetables, whole grains, nonfat dairy as part of a well-balanced diet.   goal in progress.   Personal Goal #2 Patient to reduce sodium intake to 1500mg  per day   goal in progress.   Personal Goal #3 Patient to identify strategies for weight loss of 0.5-2.0# per week.   goal in progress.   Comments Goals in action. Shellie has excellent knowledge of strategies for blood pressure control including reduction of sodium intake and increased high fiber/high potassium. He has also previously completed nutrition education through the [place]. Ranier does report motivation to lose weight to ~217# as recommended by his doctor; he is down 4.2# since starting with our program. He has been working on increasing his physical activity and decreasing portion sizes. His wife remains a good support. Javarous will benefit from participation in pulmonary rehab for nutrition, exercise, and lifestyle changes.      Intervention Plan   Intervention Prescribe, educate and counsel regarding individualized specific dietary modifications aiming towards targeted core components such as weight, hypertension, lipid management, diabetes, heart failure and other comorbidities.;Nutrition handout(s) given to patient.    Expected Outcomes Short Term Goal: Understand basic principles of dietary content, such as calories, fat, sodium, cholesterol and nutrients.;Long Term Goal: Adherence to prescribed nutrition plan.             Nutrition Assessments:  Nutrition Assessments - 02/01/23 1148       Rate Your Plate Scores   Pre Score 59            MEDIFICTS Score Key: ?70 Need to make  dietary changes  40-70 Heart Healthy Diet ? 40 Therapeutic Level Cholesterol Diet  Flowsheet Row PULMONARY REHAB CHRONIC OBSTRUCTIVE PULMONARY DISEASE from 02/01/2023 in [facility]  Picture Your Plate Total Score on Admission 59      Picture Your Plate  Scores: <40 Unhealthy dietary pattern with much room for improvement. 41-50 Dietary pattern unlikely to meet recommendations for good health and room for improvement. 51-60 More healthful dietary pattern, with some room for improvement.  >60 Healthy dietary pattern, although there may be some specific behaviors that could be improved.    Nutrition Goals Re-Evaluation:  Nutrition Goals Re-Evaluation     Row Name 02/01/23 1141 03/03/23 1108 03/31/23 0911         Goals   Current Weight 247 lb 12.8 oz (112.4 kg) 248 lb 7.3 oz (112.7 kg) 246 lb 4.1 oz (111.7 kg)     Comment A1c 6.7, lipids WNL, AST 35, ALT 34, GFR 43, CR 1.67 no new labs; most recent labs A1c 6.7, lipids WNL, AST 35, ALT 34, GFR 43, CR 1.67 no new labs; most recent labs A1c 6.7, lipids WNL, AST 35, ALT 34, GFR 43, CR 1.67     Expected Outcome Nezar presented today with elevated blood pressure (152/88); nursing asked him to bring in his blood pressure log. His wife, Massie Bougie, is also a patient in pulmonary rehab and she does the majority of the grocery shopping and cooking. Will continue to discuss reduction of sodium intake and increased high fiber/high potassium foods to support blood pressure control. They eat a wide variety of foods.Chapin will benefit from participation in pulmonary rehab for nutrition, exercise, and lifestyle changes. Goals in action. Aniceto has excellent knowledge of strategies for blood pressure control including reduction of sodium intake and increased high fiber/high potassium. He has also previously completed nutrition education through the [place]. Ramzey does report motivation to lose weight to  ~217# as recommended by his doctor; he is down ~2# since starting with our program. He has been working on increasing his physical activity and decreasing portion sizes. His wife remains a good support. Taevion will benefit from participation in pulmonary rehab for nutrition, exercise, and lifestyle changes. Goals in action. Emiel has excellent knowledge of strategies for blood pressure control including reduction of sodium intake and increased high fiber/high potassium. He has also previously completed nutrition education through the [place]. Travious does report motivation to lose weight to ~217# as recommended by his doctor; he is down 4.2# since starting with our program. He has been working on increasing his physical activity and decreasing portion sizes. His wife remains a good support. Christen will benefit from participation in pulmonary rehab for nutrition, exercise, and lifestyle changes.              Nutrition Goals Discharge (Final Nutrition Goals Re-Evaluation):  Nutrition Goals Re-Evaluation - 03/31/23 0911       Goals   Current Weight 246 lb 4.1 oz (111.7 kg)    Comment no new labs; most recent labs A1c 6.7, lipids WNL, AST 35, ALT 34, GFR 43, CR 1.67    Expected Outcome Goals in action. Sirr has excellent knowledge of strategies for blood pressure control including reduction of sodium intake and increased high fiber/high potassium. He has also previously completed nutrition education through the [place]. Tarrin does report motivation to lose weight to ~217# as recommended by his doctor; he is down 4.2# since starting with our program. He has been working on increasing his physical activity and decreasing portion sizes. His wife remains a good support. Toney will benefit from participation in pulmonary rehab for nutrition, exercise, and lifestyle changes.             Psychosocial: Target Goals: Acknowledge presence or absence of significant  depression and/or stress, maximize coping  skills, provide positive support system. Participant is able to verbalize types and ability to use techniques and skills needed for reducing stress and depression.  Initial Review & Psychosocial Screening:  Initial Psych Review & Screening - 01/24/23 1128       Initial Review   Current issues with Current Depression;Current Psychotropic Meds;Current Anxiety/Panic;Current Stress Concerns;Current Sleep Concerns    Source of Stress Concerns Chronic Illness;Family    Comments Pt's is currently under stress due to his brother being recently diagnosed with ALS and also his wife has multiple myeloma. Pt also has sleep concerns due to sleep apnea and narcolepsy. Pt does see a therapist and is on medications for depression at this time.      Family Dynamics   Good Support System? Yes      Barriers   Psychosocial barriers to participate in program The patient should benefit from training in stress management and relaxation.;Psychosocial barriers identified (see note)      Screening Interventions   Interventions Encouraged to exercise    Expected Outcomes Short Term goal: Utilizing psychosocial counselor, staff and physician to assist with identification of specific Stressors or current issues interfering with healing process. Setting desired goal for each stressor or current issue identified.;Long Term Goal: Stressors or current issues are controlled or eliminated.;Short Term goal: Identification and review with participant of any Quality of Life or Depression concerns found by scoring the questionnaire.;Long Term goal: The participant improves quality of Life and PHQ9 Scores as seen by post scores and/or verbalization of changes             Quality of Life Scores:  Scores of 19 and below usually indicate a poorer quality of life in these areas.  A difference of  2-3 points is a clinically meaningful difference.  A difference of 2-3 points in the total score of the Quality of Life Index has been  associated with significant improvement in overall quality of life, self-image, physical symptoms, and general health in studies assessing change in quality of life.  PHQ-9: Review Flowsheet       01/24/2023  Depression screen PHQ 2/9  Decreased Interest 2  Down, Depressed, Hopeless 0  PHQ - 2 Score 2  Altered sleeping 3  Tired, decreased energy 2  Change in appetite 1  Feeling bad or failure about yourself  2  Trouble concentrating 3  Moving slowly or fidgety/restless 1  Suicidal thoughts 0  PHQ-9 Score 14  Difficult doing work/chores Somewhat difficult    Details           Interpretation of Total Score  Total Score Depression Severity:  1-4 = Minimal depression, 5-9 = Mild depression, 10-14 = Moderate depression, 15-19 = Moderately severe depression, 20-27 = Severe depression   Psychosocial Evaluation and Intervention:  Psychosocial Evaluation - 01/24/23 1131       Psychosocial Evaluation & Interventions   Interventions Stress management education;Relaxation education;Encouraged to exercise with the program and follow exercise prescription    Comments Kilik is currently stressed over his brother's recent diagnosis of ALS. He's also dealing with his wifes diagnosis of multiple myeloma.    Expected Outcomes For Klinton to participate in PR free of any psychosocial barriers or concerns.    Continue Psychosocial Services  Follow up required by staff             Psychosocial Re-Evaluation:  Psychosocial Re-Evaluation     Row Name 01/24/23 1301 02/21/23 0933 03/16/23 1532  04/11/23 1515       Psychosocial Re-Evaluation   Current issues with Current Depression;Current Anxiety/Panic;Current Psychotropic Meds;Current Stress Concerns;Current Sleep Concerns Current Depression;Current Anxiety/Panic;Current Psychotropic Meds;Current Stress Concerns;Current Sleep Concerns Current Depression;Current Anxiety/Panic;Current Psychotropic Meds;Current Stress Concerns;Current Sleep  Concerns Current Depression;Current Stress Concerns    Comments No new psychosocial barriers or concerns since orientation on 5/10. Justn is still processing his brother's ALS diagnosis as well as his wife's multiple myeloma diagnosis. He is uncertain of the future and worries about his brother and his wife. Jahan feels his mental health is stable and is compliant with taking his psychotropic medications. He also sees a therapist through the [place]. He states that if he needs mental health assistance, he can reach out to the [place]. Lewi's BP and HR has been elevated throughout the program. We have reached out to the [place] for assistance, and he has followed up. Rayyaan is still processing his brother's ALS diagnosis. His wife's cancer is in remission so he feels grateful for that. Gunter feels his mental health is stable and is compliant with taking his psychotropic medications. He also sees a therapist through the [place]. He states that if he needs mental health assistance, he can reach out to the [place]. Camon states his mental health is stable at this time. Alot of his depression was coming from his wifes cancer diagnosis and his brother's ALS diagnosis. His brother's health is doing better and his wifes cancer is in remission. He does have some new stress concerns with his recent leg pain. He is scheduled to see vascular for this issue.    Expected Outcomes For pt to partcipate in PR free of any psychosocial barriers or concerns. For Raygen to continue to partcipate in PR free of any psychosocial barriers or concerns. For Vedanth to continue to partcipate in PR free of any psychosocial barriers or concerns. For Heston to continue to partcipate in PR free of any psychosocial barriers or concerns.    Interventions Encouraged to attend Pulmonary Rehabilitation for the exercise;Stress management education Encouraged to attend Pulmonary Rehabilitation for the exercise;Stress management education;Relaxation education Encouraged  to attend Pulmonary Rehabilitation for the exercise;Stress management education Encouraged to attend Pulmonary Rehabilitation for the exercise    Continue Psychosocial Services  Follow up required by staff Follow up required by staff Follow up required by staff Follow up required by staff             Psychosocial Discharge (Final Psychosocial Re-Evaluation):  Psychosocial Re-Evaluation - 04/11/23 1515       Psychosocial Re-Evaluation   Current issues with Current Depression;Current Stress Concerns    Comments Jeshua states his mental health is stable at this time. Alot of his depression was coming from his wifes cancer diagnosis and his brother's ALS diagnosis. His brother's health is doing better and his wifes cancer is in remission. He does have some new stress concerns with his recent leg pain. He is scheduled to see vascular for this issue.    Expected Outcomes For Taylin to continue to partcipate in PR free of any psychosocial barriers or concerns.    Interventions Encouraged to attend Pulmonary Rehabilitation for the exercise    Continue Psychosocial Services  Follow up required by staff             Education: Education Goals: Education classes will be provided on a weekly basis, covering required topics. Participant will state understanding/return demonstration of topics presented.  Learning Barriers/Preferences:  Learning Barriers/Preferences - 01/24/23 1132  Learning Barriers/Preferences   Learning Barriers Sight;Reading   pt is dyslexic   Learning Preferences Skilled Demonstration             Education Topics: Introduction to Pulmonary Rehab Group instruction provided by PowerPoint, verbal discussion, and written material to support subject matter. Instructor reviews what Pulmonary Rehab is, the purpose of the program, and how patients are referred.     Know Your Numbers Group instruction that is supported by a PowerPoint presentation. Instructor  discusses importance of knowing and understanding resting, exercise, and post-exercise oxygen saturation, heart rate, and blood pressure. Oxygen saturation, heart rate, blood pressure, rating of perceived exertion, and dyspnea are reviewed along with a normal range for these values.    Exercise for the Pulmonary Patient Group instruction that is supported by a PowerPoint presentation. Instructor discusses benefits of exercise, core components of exercise, frequency, duration, and intensity of an exercise routine, importance of utilizing pulse oximetry during exercise, safety while exercising, and options of places to exercise outside of rehab.  Flowsheet Row PULMONARY REHAB CHRONIC OBSTRUCTIVE PULMONARY DISEASE from 02/10/2023 in [facility]  Date 02/10/23  Educator EP  Instruction Review Code 1- Verbalizes Understanding          MET Level  Group instruction provided by PowerPoint, verbal discussion, and written material to support subject matter. Instructor reviews what METs are and how to increase METs.    Pulmonary Medications Verbally interactive group education provided by instructor with focus on inhaled medications and proper administration. Flowsheet Row PULMONARY REHAB CHRONIC OBSTRUCTIVE PULMONARY DISEASE from 02/03/2023 in [facility] for Heart, Vascular, & Lung Health  Date 02/03/23  Educator RT  Instruction Review Code 1- Verbalizes Understanding       Anatomy and Physiology of the Respiratory System Group instruction provided by PowerPoint, verbal discussion, and written material to support subject matter. Instructor reviews respiratory cycle and anatomical components of the respiratory system and their functions. Instructor also reviews differences in obstructive and restrictive respiratory diseases with examples of each.    Oxygen Safety Group instruction provided by PowerPoint, verbal  discussion, and written material to support subject matter. There is an overview of "What is Oxygen" and "Why do we need it".  Instructor also reviews how to create a safe environment for oxygen use, the importance of using oxygen as prescribed, and the risks of noncompliance. There is a brief discussion on traveling with oxygen and resources the patient may utilize. Flowsheet Row PULMONARY REHAB CHRONIC OBSTRUCTIVE PULMONARY DISEASE from 03/03/2023 in [facility] for Heart, Vascular, & Lung Health  Date 03/03/23  Educator RN  Instruction Review Code 1- Verbalizes Understanding       Oxygen Use Group instruction provided by PowerPoint, verbal discussion, and written material to discuss how supplemental oxygen is prescribed and different types of oxygen supply systems. Resources for more information are provided.  Flowsheet Row PULMONARY REHAB CHRONIC OBSTRUCTIVE PULMONARY DISEASE from 03/10/2023 in [facility] for [facility], Vascular, & Lung Health  Date 03/10/23  Educator RT  Instruction Review Code 1- Verbalizes Understanding       Breathing Techniques Group instruction that is supported by demonstration and informational handouts. Instructor discusses the benefits of pursed lip and diaphragmatic breathing and detailed demonstration on how to perform both.  Flowsheet Row PULMONARY REHAB CHRONIC OBSTRUCTIVE PULMONARY DISEASE from 03/17/2023 in [facility] for Heart, Vascular, & Lung Health  Date 03/17/23  Educator RN  Instruction Review Code 1- Verbalizes Understanding        Risk Factor Reduction Group instruction that is supported by a PowerPoint presentation. Instructor discusses the definition of a risk factor, different risk factors for pulmonary disease, and how the heart and lungs work together. Flowsheet Row PULMONARY REHAB CHRONIC OBSTRUCTIVE PULMONARY DISEASE from 04/07/2023 in [facility]  for Heart, Vascular, & Lung Health  Date 04/07/23  Educator EP  Instruction Review Code 1- Verbalizes Understanding       MD Day A group question and answer session with a medical doctor that allows participants to ask questions that relate to their pulmonary disease state.   Nutrition for the Pulmonary Patient Group instruction provided by PowerPoint slides, verbal discussion, and written materials to support subject matter. The instructor gives an explanation and review of healthy diet recommendations, which includes a discussion on weight management, recommendations for fruit and vegetable consumption, as well as protein, fluid, caffeine, fiber, sodium, sugar, and alcohol. Tips for eating when patients are short of breath are discussed.    Other Education Group or individual verbal, written, or video instructions that support the educational goals of the pulmonary rehab program. Flowsheet Row PULMONARY REHAB CHRONIC OBSTRUCTIVE PULMONARY DISEASE from 03/31/2023 in [facility] for Heart, Vascular, & Lung Health  Date 03/31/23  Educator RN  Instruction Review Code 1- Verbalizes Understanding        Knowledge Questionnaire Score:  Knowledge Questionnaire Score - 02/01/23 1122       Knowledge Questionnaire Score   Post Score --             Core Components/Risk Factors/Patient Goals at Admission:  Personal Goals and Risk Factors at Admission - 01/24/23 1133       Core Components/Risk Factors/Patient Goals on Admission    Weight Management Weight Loss    Improve shortness of breath with ADL's Yes    Intervention Provide education, individualized exercise plan and daily activity instruction to help decrease symptoms of SOB with activities of daily living.    Expected Outcomes Short Term: Improve cardiorespiratory fitness to achieve a reduction of symptoms when performing ADLs    Hypertension Yes    Intervention Provide education on lifestyle  modifcations including regular physical activity/exercise, weight management, moderate sodium restriction and increased consumption of fresh fruit, vegetables, and low fat dairy, alcohol moderation, and smoking cessation.;Monitor prescription use compliance.    Expected Outcomes Short Term: Continued assessment and intervention until BP is < 140/52mm HG in hypertensive participants. < 130/35mm HG in hypertensive participants with diabetes, heart failure or chronic kidney disease.;Long Term: Maintenance of blood pressure at goal levels.    Stress Yes    Intervention Offer individual and/or small group education and counseling on adjustment to heart disease, stress management and health-related lifestyle change. Teach and support self-help strategies.;Refer participants experiencing significant psychosocial distress to appropriate mental health specialists for further evaluation and treatment. When possible, include family members and significant others in education/counseling sessions.    Expected Outcomes Short Term: Participant demonstrates changes in health-related behavior, relaxation and other stress management skills, ability to obtain effective social support, and compliance with psychotropic medications if prescribed.;Long Term: Emotional wellbeing is indicated by absence of clinically significant psychosocial distress or social isolation.             Core Components/Risk Factors/Patient Goals Review:   Goals and Risk Factor Review     Row Name 01/24/23  1302 02/21/23 0945 03/16/23 1534 04/11/23 1524       Core Components/Risk Factors/Patient Goals Review   Personal Goals Review Weight Management/Obesity;Improve shortness of breath with ADL's;Develop more efficient breathing techniques such as purse lipped breathing and diaphragmatic breathing and practicing self-pacing with activity.;Hypertension Weight Management/Obesity;Improve shortness of breath with ADL's;Develop more efficient  breathing techniques such as purse lipped breathing and diaphragmatic breathing and practicing self-pacing with activity.;Hypertension Weight Management/Obesity;Improve shortness of breath with ADL's;Develop more efficient breathing techniques such as purse lipped breathing and diaphragmatic breathing and practicing self-pacing with activity.;Hypertension Weight Management/Obesity;Improve shortness of breath with ADL's;Hypertension    Review Pt is scheduled to start PR on 6/18 Goal in progress for weight loss. Traeger's wife is beneficial in assisting him to reduce his sodium intake and eat a wide variety of foods. His wife Massie Bougie, who is also in the program, have been working with our dietician to make changes. Goal progressing on improving his shortness of breath with ADLs. His oxygen saturation has maintained on room air. Goal progressing on developing more efficient breathing techniques such as purse lipped breathing and diaphragmatic breathing; and practicing self-pacing with activity. Daiton needs to be prompted with he becomes dyspneic. Goal progressing on blood pressure control. Shariff followed up with his PCP about his high BP and they are going to continue to watch it until his follow up again in 3 months. Cavan is keeping a BP log to take to his doctor's appointment. Keldrick tolerates coming to class. He gets discouraged when he can't put forth his full effort due to his high BP and/or HR. Hipolito will continue to benefit from participation in PR for nutrition, education, exercise and lifestyle modification. Goal in progress for weight loss.Eason is working with staff dietician to achieve his weight loss goals. Goal progressing on improving his shortness of breath with ADLs. His oxygen saturation has maintained on room air. Goal met on developing more efficient breathing techniques such as purse lipped breathing and diaphragmatic breathing; and practicing self-pacing with activity. Goal progressing on  blood pressure control. Laddie followed up with his PCP about his high BP and they are going to continue to watch it until his follow up again in 3 months. Ikenna is keeping a BP log to take to his doctor's appointment. Melesio tolerates coming to class. He gets discouraged when he can't put forth his full effort due to his high BP and/or HR. Inioluwa will continue to benefit from participation in PR for nutrition, education, exercise and lifestyle modification. Goal in progress for weight loss.Toya is working with staff dietician to achieve his weight loss goals. Goal progressing on improving his shortness of breath with ADLs. Goal met on developing more efficient breathing techniques such as purse lipped breathing and diaphragmatic breathing; and practicing self-pacing with activity. Goal progressing on blood pressure control. Bearl followed up with his PCP about his high BP and they are going to continue to watch it until his follow up again in 3 months. Laudie is keeping a BP log to take to his doctor's appointment. Vonzell tolerates coming to class. He gets discouraged when he can't put forth his full effort due to his high BP and/or HR. Ravis will continue to benefit from participation in PR for nutrition, education, exercise and lifestyle modification.    Expected Outcomes See admission goals See admission goals See admission goals See admission goals             Core Components/Risk Factors/Patient Goals at Discharge (Final Review):  Goals and Risk Factor Review - 04/11/23 1524       Core Components/Risk Factors/Patient Goals Review   Personal Goals Review Weight Management/Obesity;Improve shortness of breath with ADL's;Hypertension    Review Goal in progress for weight loss.Azaryah is working with staff dietician to achieve his weight loss goals. Goal progressing on improving his shortness of breath with ADLs. Goal met on developing more efficient breathing techniques such as purse lipped  breathing and diaphragmatic breathing; and practicing self-pacing with activity. Goal progressing on blood pressure control. Weslee followed up with his PCP about his high BP and they are going to continue to watch it until his follow up again in 3 months. Jettie is keeping a BP log to take to his doctor's appointment. Raunel tolerates coming to class. He gets discouraged when he can't put forth his full effort due to his high BP and/or HR. Dayden will continue to benefit from participation in PR for nutrition, education, exercise and lifestyle modification.    Expected Outcomes See admission goals             ITP Comments:Pt is making expected progress toward Pulmonary Rehab goals after completing 19 sessions. Recommend continued exercise, life style modification, education, and utilization of breathing techniques to increase stamina and strength, while also decreasing shortness of breath with exertion.  Dr. Mechele Collin is Medical Director for Pulmonary Rehab at [facility].     Comments: Dr. Mechele Collin is Medical Director for Pulmonary Rehab at [facility].

## 2023-04-21 ENCOUNTER — Encounter (HOSPITAL_COMMUNITY): Payer: No Typology Code available for payment source

## 2023-04-26 ENCOUNTER — Encounter (HOSPITAL_COMMUNITY)
Admission: RE | Admit: 2023-04-26 | Discharge: 2023-04-26 | Disposition: A | Discharge status: Home / Self Care | Payer: No Typology Code available for payment source | Source: Ambulatory Visit | Attending: Pulmonary Disease

## 2023-04-26 DIAGNOSIS — J449 Chronic obstructive pulmonary disease, unspecified: Secondary | ICD-10-CM

## 2023-04-26 LAB — GLUCOSE, CAPILLARY
Glucose-Capillary: 189 mg/dL — ABNORMAL HIGH (ref 70–99)
Glucose-Capillary: 109 mg/dL — ABNORMAL HIGH (ref 70–99)

## 2023-04-26 NOTE — Progress Notes (Signed)
Daily Session Note  Patient Details  Name: [name] MRN: [id number] Date of Birth: 19-Dec-1950 Referring Provider:   [name] Pulmonary Rehab Walk Test from 01/24/2023 in [facility], [facility], [facility]  Referring Provider [name]       Encounter Date: 04/26/2023  Check In:  Session Check In - 04/26/23 1135       Check-In   Supervising physician immediately available to respond to emergencies [facility] MD immediately available    Physician(s) Joni Reining, NP    Location [facility]    Staff Present Essie Hart, RN, BSN;Randi Idelle Crouch BS, ACSM-CEP, Exercise Physiologist;Kaylee Earlene Plater, MS, ACSM-CEP, Exercise Physiologist;Taven Strite Hermine Messick Belarus, RD, LDN    Virtual Visit No    Medication changes reported     No    Fall or balance concerns reported    No    Tobacco Cessation No Change    Warm-up and Cool-down Performed as group-led instruction    Resistance Training Performed Yes    VAD Patient? No    PAD/SET Patient? No      Pain Assessment   Currently in Pain? No/denies             Capillary Blood Glucose: Results for orders placed or performed during the hospital encounter of 04/19/23 (from the past 24 hour(s))  Glucose, capillary     Status: Abnormal   Collection Time: 04/26/23 10:10 AM  Result Value Ref Range   Glucose-Capillary 189 (H) 70 - 99 mg/dL      Social History   Tobacco Use  Smoking Status Never  Smokeless Tobacco Never    Goals Met:  Proper associated with RPD/PD & O2 Sat Independence with exercise equipment Exercise tolerated well No report of concerns or symptoms today Strength training completed today  Goals Unmet:  Not Applicable  Comments: Service time is from 1009 to 1146.    Dr. Mechele Collin is Medical Director for Pulmonary Rehab at [facility].

## 2023-04-28 ENCOUNTER — Encounter (HOSPITAL_COMMUNITY)
Admission: RE | Admit: 2023-04-28 | Discharge: 2023-04-28 | Disposition: A | Discharge status: Home / Self Care | Payer: No Typology Code available for payment source | Source: Ambulatory Visit | Attending: Pulmonary Disease | Admitting: Pulmonary Disease

## 2023-04-28 DIAGNOSIS — J449 Chronic obstructive pulmonary disease, unspecified: Secondary | ICD-10-CM

## 2023-04-28 DIAGNOSIS — M7989 Other specified soft tissue disorders: Secondary | ICD-10-CM

## 2023-04-28 NOTE — Progress Notes (Signed)
Daily Session Note  Patient Details  Name: [name] MRN: [id number] Date of Birth: February 24, 1951 Referring Provider:   [name] Pulmonary Rehab Walk Test from 01/24/2023 in [facility], Vascular, & Lung [facility]  Referring Provider [name]       Encounter Date: 04/28/2023  Check In:  Session Check In - 04/28/23 1125       Check-In   Supervising physician immediately available to respond to emergencies [facility] MD immediately available    Physician(s) Bernadene Person, NP    Location [facility]    Staff Present Essie Hart, RN, BSN;Randi Idelle Crouch BS, ACSM-CEP, Exercise Physiologist;Ivet Guerrieri Earlene Plater, MS, ACSM-CEP, Exercise Physiologist;Casey Hermine Messick Belarus, RD, LDN    Virtual Visit No    Medication changes reported     No    Fall or balance concerns reported    No    Tobacco Cessation No Change    Warm-up and Cool-down Performed as group-led instruction    Resistance Training Performed Yes    VAD Patient? No    PAD/SET Patient? No      Pain Assessment   Currently in Pain? No/denies    Pain Score 0-No pain    Multiple Pain Sites No             Capillary Blood Glucose: No results found for this or any previous visit (from the past 24 hour(s)).    Social History   Tobacco Use  Smoking Status Never  Smokeless Tobacco Never    Goals Met:  Proper associated with RPD/PD & O2 Sat Exercise tolerated well No report of concerns or symptoms today Strength training completed today  Goals Unmet:  Not Applicable  Comments: Service time is from 1018 to 1123.    Dr. Mechele Collin is Medical Director for Pulmonary Rehab at [facility].

## 2023-04-29 ENCOUNTER — Ambulatory Visit (INDEPENDENT_AMBULATORY_CARE_PROVIDER_SITE_OTHER): Payer: No Typology Code available for payment source | Admitting: Physician Assistant

## 2023-04-29 VITALS — BP 156/63 | HR 76 | Temp 98.3°F | Resp 18 | Ht 73.0 in | Wt 243.0 lb

## 2023-04-29 DIAGNOSIS — I872 Venous insufficiency (chronic) (peripheral): Secondary | ICD-10-CM

## 2023-04-29 NOTE — Progress Notes (Signed)
[facility] [place]   Reason for referral: mild edema with visible LE anterior shin veins.  History of Present Illness  [name] is a 72 y.o. male who presents with chief complaint: swollen leg.  Patient notes, onset of swelling a few months ago with increased vein viability in B lower legs. He denies injury or surgeries.  The patient has had no history of DVT, no history of varicose vein, no history of venous stasis ulcers, no history of  Lymphedema and no history of skin changes in lower legs.  There is unknown family history of venous disorders.  The patient has not used compression stockings in the past.  He states a few months ago he had sudden swelling in the left LE> right for unknown cause.  He was seen in the ED on 01/05/23.  He had a negative DVT study.  The swelling has gone down and he feels his anterior shin veins have become more prominent.  He has recently finished pulmonary rehab and can now walk up to 1/2 mile before he gets winded.  He is not an active guy normally and is retired.  He denies claudication or rest pain in B LE.     He has  a history of COPD and Heart failure.    Past Medical History:  Diagnosis Date   Abnormal CT scan of lung 07/23/2021   Abnormality of gait    Allergic rhinitis due to pollen 11/25/2010   Bilateral primary osteoarthritis of hip 12/12/2019   Carpal tunnel syndrome on right    Contact with and (suspected) exposure to asbestos 07/23/2021   Controlled narcolepsy 12/15/2015   COPD (chronic obstructive pulmonary disease)    Coronary artery disease    Degeneration of cervical intervertebral disc    Degenerative joint disease involving multiple joints 11/25/2010   Dysphagia    Dyspnea on exertion    Dystrophia unguium    Essential (primary) hypertension 11/25/2010   Generalized anxiety disorder    GERD (gastroesophageal reflux disease)    History of total knee arthroplasty    Billateral total knee joint  arthroplasty.   Hyperlipidemia    Hypothyroidism    Klippel-Feil sequence    Learning disability, unspecified 1997   Reading/reading comprehension   Left inguinal hernia    Low back pain 12/12/2019   Lumbar spondylosis    Major depressive disorder    Mild cognitive impairment 10/18/2022   Nonspecific abnormal electrocardiogram (ECG) (EKG)    Obstructive sleep apnea 12/15/2015   Osteoarthritis    Osteoporosis    Pain in joints of right hand    Peripheral neuropathy    Peripheral venous insufficiency    Pituitary abnormality    pituitary edema; takes bromocriptine   PTSD (post-traumatic stress disorder) 11/25/2010   Spinal stenosis of lumbar region 09/14/2018   Type 2 diabetes mellitus without complications 11/25/2010   Vitamin D deficiency    Weakness of left leg    Wears glasses     Past Surgical History:  Procedure Laterality Date   CARDIAC CATHETERIZATION     CARPAL TUNNEL RELEASE Right 10/12/2022   Procedure: Right CARPAL TUNNEL RELEASE;  Surgeon: Coletta Memos, MD;  Location: [facility] OR;  Service: Neurosurgery;  Laterality: Right;  RM 20   CERVICAL DISC SURGERY     x 2   COLONOSCOPY     HIP ARTHROPLASTY     right   INGUINAL HERNIA REPAIR Left 09/06/2019   Procedure: LAPAROSCOPIC LEFT INGUINAL HERNIA  WITH MESH;  Surgeon: Gaynelle Adu, MD;  Location: [facility];  Service: General;  Laterality: Left;   JOINT REPLACEMENT     KNEE ARTHROPLASTY     bilateral   LUMBAR LAMINECTOMY/DECOMPRESSION MICRODISCECTOMY  08/31/2011   Procedure: LUMBAR LAMINECTOMY/DECOMPRESSION MICRODISCECTOMY;  Surgeon: Clydene Fake, MD;  Location: [facility];  Service: Neurosurgery;  Laterality: N/A;  LumbarThree to Lumbar Five Laminectomy, possible Lumbar Four-Five Diskectomy   SHOULDER SURGERY     left   UPPER GI ENDOSCOPY      Social History   Socioeconomic History   Marital status: Married    Spouse name: Not on file   Number of children: 2   Years of education: 14    Highest education level: Associate degree: occupational, Scientist, product/process development, or vocational program  Occupational History   Occupation: Retired    Comment: [employer]  Tobacco Use   Smoking status: Never   Smokeless tobacco: Never  Vaping Use   Vaping status: Never Used  Substance and Sexual Activity   Alcohol use: No   Drug use: No   Sexual activity: Not Currently  Other Topics Concern   Not on file  Social History Narrative   Left handed   Drinks caffeine, soda   Lives with wife   retired   Chief Executive Officer Determinants of Corporate investment banker Strain: Not on file  Food Insecurity: Not on file  Transportation Needs: Not on file  Physical Activity: Not on file  Stress: Not on file  Social Connections: Unknown (01/07/2022)   Received from [facility]   Social Network    Social Network: Not on file  Intimate Partner Violence: Unknown (01/07/2022)   Received from [facility]   HITS    Physically Hurt: Not on file    Insult or Talk Down To: Not on file    Threaten Physical Harm: Not on file    Scream or Curse: Not on file    Family History  Problem Relation Age of Onset   Diabetes Mother    Alzheimer's disease Mother    Depression Father    ALS Father    ALS Brother    Hypertension Brother    Hypertension Brother    Cancer Brother     Current Outpatient Medications on File Prior to Visit  Medication Sig Dispense Refill   albuterol (PROVENTIL) (2.5 MG/3ML) 0.083% nebulizer solution Take 3 mLs (2.5 mg total) by nebulization every 4 (four) hours as needed for wheezing or shortness of breath. 1080 mL 2   albuterol (VENTOLIN HFA) 108 (90 Base) MCG/ACT inhaler Inhale 2 puffs into the lungs every 6 (six) hours as needed for wheezing or shortness of breath. 8 g 5   amLODipine (NORVASC) 10 MG tablet Take 10 mg by mouth daily.     aspirin EC 81 MG tablet Take 81 mg by mouth daily. Swallow whole.     azithromycin (ZITHROMAX Z-PAK) 250 MG tablet Take 2  tablets day 1 and then 1 daily for 4 days (Patient not taking: Reported on 04/29/2023) 6 each 0   b complex vitamins capsule Take 1 capsule by mouth daily.     buPROPion (WELLBUTRIN SR) 150 MG 12 hr tablet Take 150 mg by mouth daily.     carbamide peroxide (DEBROX) 6.5 % OTIC solution Place 5 drops into both ears as needed (ear wax).     Cholecalciferol (VITAMIN D3) 1000 UNITS CAPS Take 1,000 Units by mouth every morning.  empagliflozin (JARDIANCE) 25 MG TABS tablet Take 25 mg by mouth daily. Take 1/2 tablet every morning     FERGON 240 (27 Fe) MG tablet Take 240 mg by mouth daily.     fexofenadine (ALLEGRA) 180 MG tablet Take 180 mg by mouth daily.     fluticasone (FLONASE) 50 MCG/ACT nasal spray Place 1 spray into both nostrils in the morning and at bedtime.     fluticasone-salmeterol (WIXELA INHUB) 250-50 MCG/ACT AEPB Inhale 1 puff into the lungs in the morning and at bedtime. 1 each 5   gabapentin (NEURONTIN) 400 MG capsule Take 400 mg by mouth 3 (three) times daily.     hydrALAZINE (APRESOLINE) 50 MG tablet Take 50 mg by mouth 3 (three) times daily.     hydrOXYzine (ATARAX/VISTARIL) 10 MG tablet Take 20 mg by mouth at bedtime.     losartan (COZAAR) 100 MG tablet Take 100 mg by mouth every evening.     melatonin 3 MG TABS tablet Take 3 mg by mouth at bedtime.     metFORMIN (GLUCOPHAGE) 500 MG tablet Take 500 mg by mouth daily with breakfast.     modafinil (PROVIGIL) 100 MG tablet Take 200 mg (2 tablets) in the morning and 100 mg (one tablet) at noon as directed 90 tablet 2   NON FORMULARY BIPAP at bedtime     omeprazole (PRILOSEC OTC) 20 MG tablet Take 1 tablet (20 mg total) by mouth daily. 30 tablet 2   PARoxetine (PAXIL) 40 MG tablet Take 40 mg by mouth at bedtime.     pravastatin (PRAVACHOL) 40 MG tablet Take 40 mg by mouth at bedtime.     Probiotic Product (PROBIOTIC DAILY PO) Take 1 capsule by mouth daily.     QUEtiapine (SEROQUEL) 25 MG tablet Take 50 mg by mouth at bedtime.      spironolactone (ALDACTONE) 25 MG tablet Take 50 mg by mouth daily.      tamsulosin (FLOMAX) 0.4 MG CAPS capsule Take 0.4 mg by mouth every morning.     Tiotropium Bromide Monohydrate (SPIRIVA RESPIMAT) 2.5 MCG/ACT AERS Inhale 2 puffs into the lungs daily. 1 g 5   triamcinolone ointment (KENALOG) 0.1 % Apply 1 application  topically daily as needed (rash).     Turmeric 400 MG CAPS Take 400 mg by mouth daily.     vitamin E 400 UNIT capsule Take 400 Units by mouth daily.     No current facility-administered medications on file prior to visit.    Allergies as of 04/29/2023 - Review Complete 04/29/2023  Allergen Reaction Noted   Misc. sulfonamide containing compounds Other (See Comments) 07/23/2022   Ace inhibitors Swelling 08/27/2011   Sulfa antibiotics Swelling 08/27/2011     ROS:   General:  No weight loss, Fever, chills  HEENT: No recent headaches, no nasal bleeding, no visual changes, no sore throat  Neurologic: No dizziness, blackouts, seizures. No recent symptoms of stroke or mini- stroke. No recent episodes of slurred speech, or temporary blindness.  Cardiac: No recent episodes of chest pain/pressure, no shortness of breath at rest.  No shortness of breath with exertion.  Denies history of atrial fibrillation or irregular heartbeat  Vascular: No history of rest pain in feet.  No history of claudication.  No history of non-healing ulcer, No history of DVT   Pulmonary: No home oxygen, no productive cough, no hemoptysis,  No asthma or wheezing [X]  COPD Musculoskeletal:  [x ] Arthritis, [ ]  Low back pain,  [ ]   Joint pain  Hematologic:No history of hypercoagulable state.  No history of easy bleeding.  No history of anemia  Gastrointestinal: No hematochezia or melena,  No gastroesophageal reflux, no trouble swallowing  Urinary: [ ]  chronic Kidney disease, [ ]  on HD - [ ]  MWF or [ ]  TTHS, [ ]  Burning with urination, [ ]  Frequent urination, [ ]  Difficulty urinating;   Skin: No  rashes  Psychological: No history of anxiety,  No history of depression  Physical Examination  Vitals:   04/29/23 1006  BP: (!) 156/63  Pulse: 76  Resp: 18  Temp: 98.3 F (36.8 C)  TempSrc: Temporal  SpO2: 95%  Weight: 243 lb (110.2 kg)  Height: 6\' 1"  (1.854 m)    Body mass index is 32.06 kg/m.  General:  Alert and oriented, no acute distress HEENT: Normal Neck: No bruit or JVD Pulmonary: Clear to auscultation bilaterally Cardiac: Regular Rate and Rhythm without murmur Abdomen: Soft, non-tender, non-distended, no mass, no scars Skin: No rash Extremity Pulses:   radial,  dorsalis pedis, posterior tibial pulses bilaterally Musculoskeletal: No deformity or edema  Neurologic: Upper and lower extremity motor 5/5 and symmetric  DATA:    Venous Reflux Times  +--------------+---------+------+-----------+------------+-------------+  RIGHT        Reflux NoRefluxReflux TimeDiameter cmsComments                               Yes                                        +--------------+---------+------+-----------+------------+-------------+  CFV          no                                                   +--------------+---------+------+-----------+------------+-------------+  FV mid        no                                                   +--------------+---------+------+-----------+------------+-------------+  Popliteal    no                                                   +--------------+---------+------+-----------+------------+-------------+  GSV at SFJ    no                            .547                   +--------------+---------+------+-----------+------------+-------------+  GSV prox thighno                            .311                   +--------------+---------+------+-----------+------------+-------------+  GSV mid thigh no                            .  371                    +--------------+---------+------+-----------+------------+-------------+  GSV dist thighno                            .367                   +--------------+---------+------+-----------+------------+-------------+  GSV at knee   no                            .366                   +--------------+---------+------+-----------+------------+-------------+  GSV prox calf                                       out of fascia  +--------------+---------+------+-----------+------------+-------------+  SSV Pop Fossa no                            .303                   +--------------+---------+------+-----------+------------+-------------+  SSV prox calf                                       out of fascia  +--------------+---------+------+-----------+------------+-------------+     +--------------+---------+------+-----------+------------+-------------+  LEFT         Reflux NoRefluxReflux TimeDiameter cmsComments                               Yes                                        +--------------+---------+------+-----------+------------+-------------+  CFV                    yes   >1 second                            +--------------+---------+------+-----------+------------+-------------+  FV mid        no                                                   +--------------+---------+------+-----------+------------+-------------+  Popliteal    no                                                   +--------------+---------+------+-----------+------------+-------------+  GSV at SFJ    no                            .861                   +--------------+---------+------+-----------+------------+-------------+  GSV prox thighno                            .  408                   +--------------+---------+------+-----------+------------+-------------+  GSV mid thigh no                            .432                    +--------------+---------+------+-----------+------------+-------------+  GSV dist thighno                            .566                   +--------------+---------+------+-----------+------------+-------------+  GSV at knee             yes    >500 ms      .390                   +--------------+---------+------+-----------+------------+-------------+  GSV prox calf                                       out of fascia  +--------------+---------+------+-----------+------------+-------------+  SSV Pop Fossa no                            .205                   +--------------+---------+------+-----------+------------+-------------+  SSV prox calf no                            .173                   +--------------+---------+------+-----------+------------+-------------+         Summary:  Right:  - No evidence of deep vein thrombosis seen in the right lower extremity,  from the common femoral through the popliteal veins.  - No evidence of superficial venous thrombosis in the right lower  extremity.  - There is no evidence of venous reflux seen in the right lower extremity.    Left:  - No evidence of deep vein thrombosis seen in the left lower extremity,  from the common femoral through the popliteal veins.  - No evidence of superficial venous thrombosis in the left lower  extremity.  - No evidence of superficial venous reflux seen in the left short  saphenous vein.  - Venous reflux is noted in the left common femoral vein.  - Venous reflux is noted in the left greater saphenous vein at the knee.     Assessment/Plan: Mild venous reflux without skin changes or evidence of chronic venous insufficiency on the left LE.   - Venous reflux is noted in the left common femoral vein.  - Venous reflux is noted in the left greater saphenous vein at the knee.   - There is no evidence of venous reflux seen in the right lower extremity.   His symptoms have  not returned and on exam today he has no edema.  He will be fitted with knee high mild compression 15-20 mm hg, elevation when at rest and continue with his walking program from pulmonary rehab.  No vascular intervention is indicated.  He will f/u as needed.        Mosetta Pigeon PA-C [facility] [place] Office: [phone]  MD in office [place]

## 2023-05-02 NOTE — Progress Notes (Signed)
Discharge Progress Report  Patient Details  Name: [name] MRN: [id number] Date of Birth: 24-Jan-1951 Referring Provider:   [name] Pulmonary Rehab Walk Test from 01/24/2023 in [facility]  Referring Provider [name]        Number of Visits: 21  Reason for Discharge:  Patient reached a stable level of exercise. Patient independent in their exercise. Patient has met program and personal goals.  Smoking History:  Social History   Tobacco Use  Smoking Status Never  Smokeless Tobacco Never    Diagnosis:  Stage 2 moderate COPD by GOLD classification (HCC)  ADL [facility]:  Pulmonary Assessment Scores     Row Name 01/24/23 1153 02/01/23 1122 04/26/23 1603     ADL [facility]   ADL Phase Entry -- Exit   SOB Score total 56 -- 38     CAT Score   CAT Score 22 -- 14     mMRC Score   mMRC Score 3 -- --    Row Name 04/28/23 1625         mMRC Score   mMRC Score 3              Initial Exercise Prescription:  Initial Exercise Prescription - 01/24/23 1200       Date of Initial Exercise RX and Referring Provider   Date 01/24/23    Referring Provider [name]    Expected Discharge Date 04/21/23      Recumbant Bike   Level 1    RPM 60    Minutes 15    METs 2      Recumbant Elliptical   Level 1    RPM 60    Minutes 15    METs 2      Prescription Details   Frequency (times per week) 2    Duration Progress to 30 minutes of continuous aerobic without signs/symptoms of physical distress      Intensity   THRR 40-80% of Max Heartrate 57-114    Ratings of Perceived Exertion 11-13    Perceived Dyspnea 0-4      Progression   Progression Continue progressive overload as per policy without signs/symptoms or physical distress.      Resistance Training   Training Prescription Yes    Weight blue bands    Reps 10-15             Discharge Exercise Prescription (Final Exercise Prescription  Changes):  Exercise Prescription Changes - 04/19/23 1200       Response to Exercise   Blood Pressure (Admit) 128/50    Blood Pressure (Exercise) 168/60    Blood Pressure (Exit) 122/54    Heart Rate (Admit) 88 bpm    Heart Rate (Exercise) 127 bpm    Heart Rate (Exit) 95 bpm    Oxygen Saturation (Admit) 98 %    Oxygen Saturation (Exercise) 97 %    Oxygen Saturation (Exit) 98 %    Rating of Perceived Exertion (Exercise) 13    Perceived Dyspnea (Exercise) 2    Duration Continue with 30 min of aerobic exercise without signs/symptoms of physical distress.    Intensity THRR unchanged      Resistance Training   Training Prescription Yes    Weight blue bands    Reps 10-15    Time 10 Minutes      Recumbant Bike   Level 5    Minutes 15    METs 3.6  Recumbant Elliptical   Level 5    Minutes 15    METs 3.4             Functional Capacity:  6 Minute Walk     Row Name 01/26/23 0839 04/28/23 1506       6 Minute Walk   Phase Initial Discharge    Distance 735 feet 1405 feet    Distance % Change -- 91.16 %    Distance Feet Change -- 670 ft    Walk Time 6 minutes 6 minutes    # of Rest Breaks 0 0    MPH 1.39 2.66    METS 2.02 3.69    RPE 11 13    Perceived Dyspnea  1 2    VO2 Peak 7.05 12.91    Symptoms No No    Resting HR 58 bpm 99 bpm    Resting BP 164/50 130/56    Resting Oxygen Saturation  98 % 97 %    Exercise Oxygen Saturation  during 6 min walk 96 % 96 %    Max Ex. HR 112 bpm 153 bpm    Max Ex. BP 166/58 160/50    2 Minute Post BP 158/58 148/50      Interval HR   1 Minute HR 103 127    2 Minute HR 107 1412    3 Minute HR 107 147    4 Minute HR 108 146    5 Minute HR 109 153    6 Minute HR 112 145    2 Minute Post HR 65 107    Interval Heart Rate? Yes Yes      Interval Oxygen   Interval Oxygen? Yes Yes    Baseline Oxygen Saturation % 98 % 97 %    1 Minute Oxygen Saturation % 96 % 97 %    1 Minute Liters of Oxygen 0 L 0 L    2 Minute Oxygen  Saturation % 96 % 96 %    2 Minute Liters of Oxygen 0 L 0 L    3 Minute Oxygen Saturation % 97 % 96 %    3 Minute Liters of Oxygen 0 L 0 L    4 Minute Oxygen Saturation % 96 % 96 %    4 Minute Liters of Oxygen 0 L 0 L    5 Minute Oxygen Saturation % 97 % 96 %    5 Minute Liters of Oxygen 0 L 0 L    6 Minute Oxygen Saturation % 97 % 96 %    6 Minute Liters of Oxygen 0 L 0 L    2 Minute Post Oxygen Saturation % 98 % 98 %    2 Minute Post Liters of Oxygen 0 L 0 L             Psychological, QOL, Others - Outcomes: PHQ 2/9:    04/26/2023    3:39 PM 01/24/2023   11:52 AM  Depression screen PHQ 2/9  Decreased Interest 2 2  Down, Depressed, Hopeless 1 0  PHQ - 2 Score 3 2  Altered sleeping 3 3  Tired, decreased energy 1 2  Change in appetite 0 1  Feeling bad or failure about yourself  0 2  Trouble concentrating 2 3  Moving slowly or fidgety/restless 1 1  Suicidal thoughts 0 0  PHQ-9 Score 10 14  Difficult doing work/chores Somewhat difficult Somewhat difficult    Quality of Life:   Personal Goals: Goals  established at orientation with interventions provided to work toward goal.  Personal Goals and Risk Factors at Admission - 01/24/23 1133       Core Components/Risk Factors/Patient Goals on Admission    Weight Management Weight Loss    Improve shortness of breath with ADL's Yes    Intervention Provide education, individualized exercise plan and daily activity instruction to help decrease symptoms of SOB with activities of daily living.    Expected Outcomes Short Term: Improve cardiorespiratory fitness to achieve a reduction of symptoms when performing ADLs    Hypertension Yes    Intervention Provide education on lifestyle modifcations including regular physical activity/exercise, weight management, moderate sodium restriction and increased consumption of fresh fruit, vegetables, and low fat dairy, alcohol moderation, and smoking cessation.;Monitor prescription use  compliance.    Expected Outcomes Short Term: Continued assessment and intervention until BP is < 140/56mm HG in hypertensive participants. < 130/74mm HG in hypertensive participants with diabetes, heart failure or chronic kidney disease.;Long Term: Maintenance of blood pressure at goal levels.    Stress Yes    Intervention Offer individual and/or small group education and counseling on adjustment to heart disease, stress management and health-related lifestyle change. Teach and support self-help strategies.;Refer participants experiencing significant psychosocial distress to appropriate mental health specialists for further evaluation and treatment. When possible, include family members and significant others in education/counseling sessions.    Expected Outcomes Short Term: Participant demonstrates changes in health-related behavior, relaxation and other stress management skills, ability to obtain effective social support, and compliance with psychotropic medications if prescribed.;Long Term: Emotional wellbeing is indicated by absence of clinically significant psychosocial distress or social isolation.              Personal Goals Discharge:  Goals and Risk Factor Review     Row Name 01/24/23 1302 02/21/23 0945 03/16/23 1534 04/11/23 1524 05/02/23 1224     Core Components/Risk Factors/Patient Goals Review   Personal Goals Review Weight Management/Obesity;Improve shortness of breath with ADL's;Develop more efficient breathing techniques such as purse lipped breathing and diaphragmatic breathing and practicing self-pacing with activity.;Hypertension Weight Management/Obesity;Improve shortness of breath with ADL's;Develop more efficient breathing techniques such as purse lipped breathing and diaphragmatic breathing and practicing self-pacing with activity.;Hypertension Weight Management/Obesity;Improve shortness of breath with ADL's;Develop more efficient breathing techniques such as purse lipped  breathing and diaphragmatic breathing and practicing self-pacing with activity.;Hypertension Weight Management/Obesity;Improve shortness of breath with ADL's;Hypertension Weight Management/Obesity;Improve shortness of breath with ADL's;Hypertension   Review Pt is scheduled to start PR on 6/18 Goal in progress for weight loss. Tomas's wife is beneficial in assisting him to reduce his sodium intake and eat a wide variety of foods. His wife Massie Bougie, who is also in the program, have been working with our dietician to make changes. Goal progressing on improving his shortness of breath with ADLs. His oxygen saturation has maintained on room air. Goal progressing on developing more efficient breathing techniques such as purse lipped breathing and diaphragmatic breathing; and practicing self-pacing with activity. Pearson needs to be prompted with he becomes dyspneic. Goal progressing on blood pressure control. Sho followed up with his PCP about his high BP and they are going to continue to watch it until his follow up again in 3 months. Cheron is keeping a BP log to take to his doctor's appointment. Sevag tolerates coming to class. He gets discouraged when he can't put forth his full effort due to his high BP and/or HR. Hilliard will continue to benefit from participation  in PR for nutrition, education, exercise and lifestyle modification. Goal in progress for weight loss.Lalo is working with staff dietician to achieve his weight loss goals. Goal progressing on improving his shortness of breath with ADLs. His oxygen saturation has maintained on room air. Goal met on developing more efficient breathing techniques such as purse lipped breathing and diaphragmatic breathing; and practicing self-pacing with activity. Goal progressing on blood pressure control. Shaunte followed up with his PCP about his high BP and they are going to continue to watch it until his follow up again in 3 months. Samauri is keeping a BP log to take  to his doctor's appointment. Milad tolerates coming to class. He gets discouraged when he can't put forth his full effort due to his high BP and/or HR. Bartolome will continue to benefit from participation in PR for nutrition, education, exercise and lifestyle modification. Goal in progress for weight loss.Khamden is working with staff dietician to achieve his weight loss goals. Goal progressing on improving his shortness of breath with ADLs. Goal met on developing more efficient breathing techniques such as purse lipped breathing and diaphragmatic breathing; and practicing self-pacing with activity. Goal progressing on blood pressure control. Luisangel followed up with his PCP about his high BP and they are going to continue to watch it until his follow up again in 3 months. Bairon is keeping a BP log to take to his doctor's appointment. Alfonzo tolerates coming to class. He gets discouraged when he can't put forth his full effort due to his high BP and/or HR. Barnes will continue to benefit from participation in PR for nutrition, education, exercise and lifestyle modification. Cedric graduated the Pulmonary Rehab program today, 04/28/23. Jobie completed 21 classes. At the time of graduation, Eyuel met his goal for weight loss during the program. He worked with our dietitian and successful lost ~7.5# during the program. His goal of BP control is still progressing. He is keeping a log if his BPs and will follow up with his MD. Wylie's goal of reduced stress was also met at graduation. He practices pursed lip breathing and has supportive family and friends to speak to. Jonnie's decreasing shortness of breath goal was also met. His shortness of breath scores decreased from 56 to 38, his CAT score decreased from a 22 to a 14, but his mMRC maintained at a 3. He maintained his oxygen saturation of 88% or higher on room air while exercising. His distance increased from 735 to 1405, a 91% increase, MPH from 1.39 to 2.66,  and METs from 2.02 to 3.69, and VO2 peak from 7.05 to 12.91. Waqas is confident in continue his exercise after graduation. We are proud of the work Harris has accomplished during his time in Pulmonary Rehab.   Expected Outcomes See admission goals See admission goals See admission goals See admission goals For K[place]ne to continue exercising, losing weight, decreasing his shortness of breath, decreasing his stress, and continuing to monitor his blood pressures post graduation.            Exercise Goals and Review:  Exercise Goals     Row Name 01/24/23 1137 01/25/23 0751 02/18/23 0949 03/11/23 1343 04/14/23 1624     Exercise Goals   Increase Physical Activity Yes Yes Yes Yes Yes   Intervention Provide advice, education, support and counseling about physical activity/exercise needs.;Develop an individualized exercise prescription for aerobic and resistive training based on initial evaluation findings, risk stratification, comorbidities and participant's personal goals. Provide advice, education, support  and counseling about physical activity/exercise needs.;Develop an individualized exercise prescription for aerobic and resistive training based on initial evaluation findings, risk stratification, comorbidities and participant's personal goals. Provide advice, education, support and counseling about physical activity/exercise needs.;Develop an individualized exercise prescription for aerobic and resistive training based on initial evaluation findings, risk stratification, comorbidities and participant's personal goals. Provide advice, education, support and counseling about physical activity/exercise needs.;Develop an individualized exercise prescription for aerobic and resistive training based on initial evaluation findings, risk stratification, comorbidities and participant's personal goals. Provide advice, education, support and counseling about physical activity/exercise needs.;Develop an  individualized exercise prescription for aerobic and resistive training based on initial evaluation findings, risk stratification, comorbidities and participant's personal goals.   Expected Outcomes Short Term: Attend rehab on a regular basis to increase amount of physical activity.;Long Term: Exercising regularly at least 3-5 days a week.;Long Term: Add in home exercise to make exercise part of routine and to increase amount of physical activity. Short Term: Attend rehab on a regular basis to increase amount of physical activity.;Long Term: Exercising regularly at least 3-5 days a week.;Long Term: Add in home exercise to make exercise part of routine and to increase amount of physical activity. Short Term: Attend rehab on a regular basis to increase amount of physical activity.;Long Term: Exercising regularly at least 3-5 days a week.;Long Term: Add in home exercise to make exercise part of routine and to increase amount of physical activity. Short Term: Attend rehab on a regular basis to increase amount of physical activity.;Long Term: Exercising regularly at least 3-5 days a week.;Long Term: Add in home exercise to make exercise part of routine and to increase amount of physical activity. Short Term: Attend rehab on a regular basis to increase amount of physical activity.;Long Term: Exercising regularly at least 3-5 days a week.;Long Term: Add in home exercise to make exercise part of routine and to increase amount of physical activity.   Increase Strength and Stamina Yes Yes Yes Yes Yes   Intervention Provide advice, education, support and counseling about physical activity/exercise needs.;Develop an individualized exercise prescription for aerobic and resistive training based on initial evaluation findings, risk stratification, comorbidities and participant's personal goals. Provide advice, education, support and counseling about physical activity/exercise needs.;Develop an individualized exercise  prescription for aerobic and resistive training based on initial evaluation findings, risk stratification, comorbidities and participant's personal goals. Provide advice, education, support and counseling about physical activity/exercise needs.;Develop an individualized exercise prescription for aerobic and resistive training based on initial evaluation findings, risk stratification, comorbidities and participant's personal goals. Provide advice, education, support and counseling about physical activity/exercise needs.;Develop an individualized exercise prescription for aerobic and resistive training based on initial evaluation findings, risk stratification, comorbidities and participant's personal goals. Provide advice, education, support and counseling about physical activity/exercise needs.;Develop an individualized exercise prescription for aerobic and resistive training based on initial evaluation findings, risk stratification, comorbidities and participant's personal goals.   Expected Outcomes Short Term: Increase workloads from initial exercise prescription for resistance, speed, and METs.;Short Term: Perform resistance training exercises routinely during rehab and add in resistance training at home;Long Term: Improve cardiorespiratory fitness, muscular endurance and strength as measured by increased METs and functional capacity ( ) Short Term: Increase workloads from initial exercise prescription for resistance, speed, and METs.;Short Term: Perform resistance training exercises routinely during rehab and add in resistance training at home;Long Term: Improve cardiorespiratory fitness, muscular endurance and strength as measured by increased METs and functional capacity ( ) Short Term: Increase workloads from initial  exercise prescription for resistance, speed, and METs.;Short Term: Perform resistance training exercises routinely during rehab and add in resistance training at home;Long Term: Improve  cardiorespiratory fitness, muscular endurance and strength as measured by increased METs and functional capacity ( ) Short Term: Increase workloads from initial exercise prescription for resistance, speed, and METs.;Short Term: Perform resistance training exercises routinely during rehab and add in resistance training at home;Long Term: Improve cardiorespiratory fitness, muscular endurance and strength as measured by increased METs and functional capacity ( ) Short Term: Increase workloads from initial exercise prescription for resistance, speed, and METs.;Short Term: Perform resistance training exercises routinely during rehab and add in resistance training at home;Long Term: Improve cardiorespiratory fitness, muscular endurance and strength as measured by increased METs and functional capacity ( )   Able to understand and use rate of perceived exertion (RPE) scale Yes Yes Yes Yes Yes   Intervention Provide education and explanation on how to use RPE scale Provide education and explanation on how to use RPE scale Provide education and explanation on how to use RPE scale Provide education and explanation on how to use RPE scale Provide education and explanation on how to use RPE scale   Expected Outcomes Short Term: Able to use RPE daily in rehab to express subjective intensity level;Long Term:  Able to use RPE to guide intensity level when exercising independently Short Term: Able to use RPE daily in rehab to express subjective intensity level;Long Term:  Able to use RPE to guide intensity level when exercising independently Short Term: Able to use RPE daily in rehab to express subjective intensity level;Long Term:  Able to use RPE to guide intensity level when exercising independently Short Term: Able to use RPE daily in rehab to express subjective intensity level;Long Term:  Able to use RPE to guide intensity level when exercising independently Short Term: Able to use RPE daily in rehab to express  subjective intensity level;Long Term:  Able to use RPE to guide intensity level when exercising independently   Able to understand and use Dyspnea scale Yes Yes Yes Yes Yes   Intervention Provide education and explanation on how to use Dyspnea scale Provide education and explanation on how to use Dyspnea scale Provide education and explanation on how to use Dyspnea scale Provide education and explanation on how to use Dyspnea scale Provide education and explanation on how to use Dyspnea scale   Expected Outcomes Short Term: Able to use Dyspnea scale daily in rehab to express subjective sense of shortness of breath during exertion;Long Term: Able to use Dyspnea scale to guide intensity level when exercising independently Short Term: Able to use Dyspnea scale daily in rehab to express subjective sense of shortness of breath during exertion;Long Term: Able to use Dyspnea scale to guide intensity level when exercising independently Short Term: Able to use Dyspnea scale daily in rehab to express subjective sense of shortness of breath during exertion;Long Term: Able to use Dyspnea scale to guide intensity level when exercising independently Short Term: Able to use Dyspnea scale daily in rehab to express subjective sense of shortness of breath during exertion;Long Term: Able to use Dyspnea scale to guide intensity level when exercising independently Short Term: Able to use Dyspnea scale daily in rehab to express subjective sense of shortness of breath during exertion;Long Term: Able to use Dyspnea scale to guide intensity level when exercising independently   Knowledge and understanding of Target Heart Rate Range (THRR) Yes Yes Yes Yes Yes   Intervention Provide education and explanation  of THRR including how the numbers were predicted and where they are located for reference Provide education and explanation of THRR including how the numbers were predicted and where they are located for reference Provide education  and explanation of THRR including how the numbers were predicted and where they are located for reference Provide education and explanation of THRR including how the numbers were predicted and where they are located for reference Provide education and explanation of THRR including how the numbers were predicted and where they are located for reference   Expected Outcomes Short Term: Able to state/look up THRR;Long Term: Able to use THRR to govern intensity when exercising independently;Short Term: Able to use daily as guideline for intensity in rehab Short Term: Able to state/look up THRR;Long Term: Able to use THRR to govern intensity when exercising independently;Short Term: Able to use daily as guideline for intensity in rehab Short Term: Able to state/look up THRR;Long Term: Able to use THRR to govern intensity when exercising independently;Short Term: Able to use daily as guideline for intensity in rehab Short Term: Able to state/look up THRR;Long Term: Able to use THRR to govern intensity when exercising independently;Short Term: Able to use daily as guideline for intensity in rehab Short Term: Able to state/look up THRR;Long Term: Able to use THRR to govern intensity when exercising independently;Short Term: Able to use daily as guideline for intensity in rehab   Understanding of Exercise Prescription Yes Yes Yes Yes Yes   Intervention Provide education, explanation, and written materials on patient's individual exercise prescription Provide education, explanation, and written materials on patient's individual exercise prescription Provide education, explanation, and written materials on patient's individual exercise prescription Provide education, explanation, and written materials on patient's individual exercise prescription Provide education, explanation, and written materials on patient's individual exercise prescription   Expected Outcomes Short Term: Able to explain program exercise  prescription;Long Term: Able to explain home exercise prescription to exercise independently Short Term: Able to explain program exercise prescription;Long Term: Able to explain home exercise prescription to exercise independently Short Term: Able to explain program exercise prescription;Long Term: Able to explain home exercise prescription to exercise independently Short Term: Able to explain program exercise prescription;Long Term: Able to explain home exercise prescription to exercise independently Short Term: Able to explain program exercise prescription;Long Term: Able to explain home exercise prescription to exercise independently            Exercise Goals Re-Evaluation:  Exercise Goals Re-Evaluation     Row Name 01/25/23 0752 02/18/23 0949 03/11/23 1343 04/14/23 1624 04/28/23 1531     Exercise Goal Re-Evaluation   Exercise Goals Review Increase Physical Activity;Able to understand and use Dyspnea scale;Understanding of Exercise Prescription;Increase Strength and Stamina;Knowledge and understanding of Target Heart Rate Range (THRR);Able to understand and use rate of perceived exertion (RPE) scale Increase Physical Activity;Able to understand and use Dyspnea scale;Understanding of Exercise Prescription;Increase Strength and Stamina;Knowledge and understanding of Target Heart Rate Range (THRR);Able to understand and use rate of perceived exertion (RPE) scale Increase Physical Activity;Able to understand and use Dyspnea scale;Understanding of Exercise Prescription;Increase Strength and Stamina;Knowledge and understanding of Target Heart Rate Range (THRR);Able to understand and use rate of perceived exertion (RPE) scale Increase Physical Activity;Able to understand and use Dyspnea scale;Understanding of Exercise Prescription;Increase Strength and Stamina;Knowledge and understanding of Target Heart Rate Range (THRR);Able to understand and use rate of perceived exertion (RPE) scale Increase Physical  Activity;Able to understand and use Dyspnea scale;Understanding of Exercise Prescription;Increase Strength and Stamina;Knowledge and  understanding of Target Heart Rate Range (THRR);Able to understand and use rate of perceived exertion (RPE) scale   Comments Pt is scheduled to begin exercise next week. Will continue to monitor and progress as able. Jaquaveon has completed 5 exercise sessions. He exercises for 15 min on the recumbent bike and recumbent elliptical. Pt averages 2.8 METs at level 4 on the recumbent bike and 2.4 MEts at level 4 on the recumbent elliptical. He performs the warmup and cooldown standing without limitations. Daily greatly increased his workload for both exercise modes on his first day. This caused an elevated BP and drop in his CBG. He was decreased then slowly increased.  He tolerated the slower increase in workload better. Lorance seems very motivated to exercise. Will continue to monitor and progress as able. Javahn has completed 9 exercise sessions. He exercises for 15 min on the recumbent bike and recumbent elliptical. Pt averages 2.8 METs at ;evel 5 on the recumbent bike and 2.2 METs at level 5 on the recumbent elliptical. He performs the warmup and cooldown standing without limitations. Azi has increased his workloads for both exercise modes as METs have increased. He BP and CBG are better managed with exercise. He is understanding pacing strategies when exercising. Brookson seems motivated to exercise and improve his functional capacity. Will continue to monitor and progress as able. Nabor has completed 18 exercise sessions. He exercises for 15 min on the recumbent bike and recumbent elliptical. Pt averages 4 METs at level 5 on the recumbent bike and 4 METs at level 5 on the recumbent elliptical. He performs the warmup and cooldown standing without limitations. Aries's METs have increased despite not increasing his worklooad. He understands METs more and how to adjust his speed  accordingly. We have recently discussed home exercise as Thaden is not currenltly exercising at home. He hopes to start exercising with his spouse. Will continue to monitor and progress as able. Jarrius has completed 21 exercise sessions. He consistently increased his workload for both exercise modes. His peak METs were 3.5 on the recumbent bike and 3 on the recumbent elliptical. Zohair understands how to check is heart rate and know if it is in his THR.   Expected Outcomes Through exercise at rehab and home, the patient will decrease shortness of breath with daily activities and feel confident in carrying out an exercise regimen at home. Through exercise at rehab and home, the patient will decrease shortness of breath with daily activities and feel confident in carrying out an exercise regimen at home. Through exercise at rehab and home, the patient will decrease shortness of breath with daily activities and feel confident in carrying out an exercise regimen at home. Through exercise at rehab and home, the patient will decrease shortness of breath with daily activities and feel confident in carrying out an exercise regimen at home. Through exercise at rehab and home, the patient will decrease shortness of breath with daily activities and feel confident in carrying out an exercise regimen at home.            Nutrition & Weight - Outcomes:  Pre Biometrics - 01/24/23 1258       Pre Biometrics   Grip Strength 28 kg              Nutrition:  Nutrition Therapy & Goals - 04/28/23 1158       Nutrition Therapy   Diet Heart Healthy Diet    Drug/Food Interactions Statins/Certain Fruits      Personal  Nutrition Goals   Nutrition Goal Patient to improve diet quality by using the plate method as a guide for meal planning to include lean protein/plant protein, fruits, vegetables, whole grains, nonfat dairy as part of a well-balanced diet.   goal in progress.   Personal Goal #2 Patient to reduce  sodium intake to 1500mg  per day   goal in progress.   Personal Goal #3 Patient to identify strategies for weight loss of 0.5-2.0# per week.   goal in progress.   Comments Goals in action. Karl has excellent knowledge of strategies for blood pressure control including reduction of sodium intake and increased high fiber/high potassium. He has also previously completed nutrition education through the [place]. Marris does report motivation to lose weight to ~217# as recommended by his doctor; he is down 7.5# since starting with our program. He has been working on increasing his physical activity and decreasing portion sizes. His wife remains a good support. Aston will benefit from implementation of dietary, exercise, and lifestyle changes.      Intervention Plan   Intervention Prescribe, educate and counsel regarding individualized specific dietary modifications aiming towards targeted core components such as weight, hypertension, lipid management, diabetes, heart failure and other comorbidities.;Nutrition handout(s) given to patient.    Expected Outcomes Short Term Goal: Understand basic principles of dietary content, such as calories, fat, sodium, cholesterol and nutrients.;Long Term Goal: Adherence to prescribed nutrition plan.             Nutrition Discharge:  Nutrition Assessments - 04/26/23 1212       Rate Your Plate Scores   Pre Score 59    Post Score 64             Education Questionnaire Score:  Knowledge Questionnaire Score - 04/26/23 1600       Knowledge Questionnaire Score   Post Score 15/18             Dwayn graduated the Pulmonary Rehab program today, 04/28/23. Maurilio completed 21 classes. At the time of graduation, Zaiven stated his mental health was stable. He is currently satisfied with his psychotropic meds and states they are helping. His wife's cancer is in remission and his brother with ALS is stable. He did state he was concerned about some appointments  coming up. He denied any additional mental health needs at stated he could get therapy through the [place] if needed. He has a great supportive wife who staff know as both Lawrene and his wife were in the same Pulmonary Rehab class.   Haidyn met his goal for weight loss during the program. He worked with our dietitian and successful lost ~7.5# during the program. His goal of BP control is still progressing. He is keeping a log if his BPs and will follow up with his MD. Marqueze's goal of reduced stress was also met at graduation. He practices pursed lip breathing and has supportive family and friends to speak to. Kimani's decreasing shortness of breath goal was also met. His shortness of breath scores decreased from 56 to 38, his CAT score decreased from a 22 to a 14, but his mMRC maintained at a 3. He maintained his oxygen saturation of 88% or higher on room air while exercising. His distance increased from 735 to 1405, a 91% increase, MPH from 1.39 to 2.66, and METs from 2.02 to 3.69, and VO2 peak from 7.05 to 12.91. Abdo is confident in continue his exercise after graduation. We are proud of the work [employer]  has accomplished during his time in Pulmonary Rehab.

## 2023-05-04 ENCOUNTER — Ambulatory Visit (INDEPENDENT_AMBULATORY_CARE_PROVIDER_SITE_OTHER): Payer: No Typology Code available for payment source | Admitting: Pulmonary Disease

## 2023-05-04 ENCOUNTER — Encounter: Payer: Self-pay | Admitting: Pulmonary Disease

## 2023-05-04 VITALS — BP 138/64 | HR 76 | Temp 97.2°F | Ht 73.5 in | Wt 244.0 lb

## 2023-05-04 DIAGNOSIS — J449 Chronic obstructive pulmonary disease, unspecified: Secondary | ICD-10-CM

## 2023-05-04 MED ORDER — MODAFINIL 100 MG PO TABS: ORAL_TABLET | ORAL | 2 refills | Status: DC

## 2023-05-04 NOTE — Progress Notes (Signed)
[name]    [id number]    [birth year]  Primary Care Physician:Dean, Lind Guest, MD  Referring Physician: Gwenyth Bender, MD [address] [place],  [name] [address]  Chief complaint:   Patient with a history of narcolepsy, obstructive sleep apnea Compliant with BiPAP, compliant with stimulant medications In for follow-up today, denies any significant symptoms  HPI:  His breathing has been relatively steady  Sleep quality is acceptable Daytime energy level is better  Continues to use BiPAP at night and compliant with use  Does use modafinil for daytime sleepiness and this seems to be helping  Still gets tired easily Takes daytime naps of about an hour to 2, this does not seem to affect his sleeping at night  Diagnosed with narcolepsy many years ago  He does have pleural plaques and rounded atelectasis on CT scan  His health has been generally well  History of PTSD  Exposures: No significant exposures Smoking history: Never smoker   Outpatient Encounter Medications as of 05/04/2023  Medication Sig   albuterol (PROVENTIL) (2.5 MG/3ML) 0.083% nebulizer solution Take 3 mLs (2.5 mg total) by nebulization every 4 (four) hours as needed for wheezing or shortness of breath.   albuterol (VENTOLIN HFA) 108 (90 Base) MCG/ACT inhaler Inhale 2 puffs into the lungs every 6 (six) hours as needed for wheezing or shortness of breath.   amLODipine (NORVASC) 10 MG tablet Take 10 mg by mouth daily.   aspirin EC 81 MG tablet Take 81 mg by mouth daily. Swallow whole.   b complex vitamins capsule Take 1 capsule by mouth daily.   buPROPion (WELLBUTRIN SR) 150 MG 12 hr tablet Take 150 mg by mouth daily.   carbamide peroxide (DEBROX) 6.5 % OTIC solution Place 5 drops into both ears as needed (ear wax).   Cholecalciferol (VITAMIN D3) 1000 UNITS CAPS Take 1,000 Units by mouth every morning.   empagliflozin (JARDIANCE) 25 MG TABS tablet Take 25 mg by mouth daily. Take  1/2 tablet every morning   FERGON 240 (27 Fe) MG tablet Take 240 mg by mouth daily.   fexofenadine (ALLEGRA) 180 MG tablet Take 180 mg by mouth daily.   fluticasone (FLONASE) 50 MCG/ACT nasal spray Place 1 spray into both nostrils in the morning and at bedtime.   fluticasone-salmeterol (WIXELA INHUB) 250-50 MCG/ACT AEPB Inhale 1 puff into the lungs in the morning and at bedtime.   gabapentin (NEURONTIN) 400 MG capsule Take 400 mg by mouth 3 (three) times daily.   hydrALAZINE (APRESOLINE) 50 MG tablet Take 50 mg by mouth 3 (three) times daily.   hydrOXYzine (ATARAX/VISTARIL) 10 MG tablet Take 20 mg by mouth at bedtime.   losartan (COZAAR) 100 MG tablet Take 100 mg by mouth every evening.   melatonin 3 MG TABS tablet Take 3 mg by mouth at bedtime.   metFORMIN (GLUCOPHAGE) 500 MG tablet Take 500 mg by mouth daily with breakfast.   modafinil (PROVIGIL) 100 MG tablet Take 200 mg (2 tablets) in the morning and 100 mg (one tablet) at noon as directed   NON FORMULARY BIPAP at bedtime   omeprazole (PRILOSEC OTC) 20 MG tablet Take 1 tablet (20 mg total) by mouth daily.   PARoxetine (PAXIL) 40 MG tablet Take 40 mg by mouth at bedtime.   pravastatin (PRAVACHOL) 40 MG tablet Take 40 mg by mouth at bedtime.   Probiotic Product (PROBIOTIC DAILY PO) Take 1 capsule by mouth daily.  QUEtiapine (SEROQUEL) 25 MG tablet Take 50 mg by mouth at bedtime.   spironolactone (ALDACTONE) 25 MG tablet Take 50 mg by mouth daily.    tamsulosin (FLOMAX) 0.4 MG CAPS capsule Take 0.4 mg by mouth every morning.   Tiotropium Bromide Monohydrate (SPIRIVA RESPIMAT) 2.5 MCG/ACT AERS Inhale 2 puffs into the lungs daily.   triamcinolone ointment (KENALOG) 0.1 % Apply 1 application  topically daily as needed (rash).   Turmeric 400 MG CAPS Take 400 mg by mouth daily.   vitamin E 400 UNIT capsule Take 400 Units by mouth daily.   [DISCONTINUED] azithromycin (ZITHROMAX Z-PAK) 250 MG tablet Take 2 tablets day 1 and then 1 daily for 4 days  (Patient not taking: Reported on 04/29/2023)   No facility-administered encounter medications on file as of 05/04/2023.    Allergies as of 05/04/2023 - Review Complete 05/04/2023  Allergen Reaction Noted   Misc. sulfonamide containing compounds Other (See Comments) 07/23/2022   Ace inhibitors Swelling 08/27/2011   Sulfa antibiotics Swelling 08/27/2011    Past Medical History:  Diagnosis Date   Abnormal CT scan of lung 07/23/2021   Abnormality of gait    Allergic rhinitis due to pollen 11/25/2010   Bilateral primary osteoarthritis of hip 12/12/2019   Carpal tunnel syndrome on right    Contact with and (suspected) exposure to asbestos 07/23/2021   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   Hyperlipidemia    Hypothyroidism    Klippel-Feil sequence    Learning disability, unspecified 1997   Reading/reading comprehension   Left inguinal hernia    Low back pain 12/12/2019   Lumbar spondylosis    Major depressive disorder    Mild cognitive impairment 10/18/2022   Nonspecific abnormal electrocardiogram (ECG) (EKG)    Obstructive sleep apnea 12/15/2015   Osteoarthritis    Osteoporosis    Pain in joints of right hand    Peripheral neuropathy    Peripheral venous insufficiency    Pituitary abnormality    pituitary edema; takes bromocriptine   PTSD (post-traumatic stress disorder) 11/25/2010   Spinal stenosis of lumbar region 09/14/2018   Type 2 diabetes mellitus without complications 11/25/2010   Vitamin D deficiency    Weakness of left leg     Wears glasses     Past Surgical History:  Procedure Laterality Date   CARDIAC CATHETERIZATION     CARPAL TUNNEL RELEASE Right 10/12/2022   Procedure: Right CARPAL TUNNEL RELEASE;  Surgeon: Coletta Memos, MD;  Location: [facility] OR;  Service: Neurosurgery;  Laterality: Right;  RM 20   CERVICAL DISC SURGERY     x 2   COLONOSCOPY     HIP ARTHROPLASTY     right   INGUINAL HERNIA REPAIR Left 09/06/2019   Procedure: LAPAROSCOPIC LEFT INGUINAL HERNIA WITH MESH;  Surgeon: Gaynelle Adu, MD;  Location: [facility];  Service: General;  Laterality: Left;   JOINT REPLACEMENT     KNEE ARTHROPLASTY     bilateral   LUMBAR LAMINECTOMY/DECOMPRESSION MICRODISCECTOMY  08/31/2011   Procedure: LUMBAR LAMINECTOMY/DECOMPRESSION MICRODISCECTOMY;  Surgeon: Clydene Fake, MD;  Location: [facility] NEURO ORS;  Service: Neurosurgery;  Laterality: N/A;  LumbarThree to Lumbar Five Laminectomy, possible Lumbar Four-Five Diskectomy   SHOULDER SURGERY     left   UPPER GI ENDOSCOPY      Family History  Problem Relation Age of Onset   Diabetes Mother    Alzheimer's disease Mother    Depression Father    ALS Father    ALS Brother    Hypertension Brother    Hypertension Brother    Cancer Brother     Social History   Socioeconomic History   Marital status: Married    Spouse name: Not on file   Number of children: 2   Years of education: 14   Highest education level: Associate degree: occupational, Scientist, product/process development, or vocational program  Occupational History   Occupation: Retired    Comment: [employer]  Tobacco Use   Smoking status: Never   Smokeless tobacco: Never  Vaping Use   Vaping status: Never Used  Substance and Sexual Activity   Alcohol use: No   Drug use: No   Sexual activity: Not Currently  Other Topics Concern   Not on file  Social History Narrative   Left handed   Drinks caffeine, soda   Lives with wife   retired   Chief Executive Officer Determinants of Corporate investment banker Strain: Not on file   Food Insecurity: Not on file  Transportation Needs: Not on file  Physical Activity: Not on file  Stress: Not on file  Social Connections: Unknown (01/07/2022)   [facility]   Social Network    Social Network: Not on file  Intimate Partner Violence: Unknown (01/07/2022)   Received from [facility]   HITS    Physically Hurt: Not on file    Insult or Talk Down To: Not on file    Threaten Physical Harm: Not on file    Scream or Curse: Not on file   Review of Systems  Constitutional:  Negative for activity change and appetite change.  HENT: Negative.    Eyes: Negative.   Respiratory:  Positive for apnea and shortness of breath. Negative for cough and choking.   Cardiovascular: Negative.   Endocrine: Negative.   Genitourinary: Negative.   Allergic/Immunologic: Negative.   Neurological: Negative.   Hematological: Negative.   Psychiatric/Behavioral:  Positive for sleep disturbance.     Vitals:   05/04/23 0914  BP: 138/64  Pulse: 76  Temp: (!) 97.2 F (36.2 C)  SpO2: 99%    Physical Exam Constitutional:      General: He is not in acute distress.    Appearance: He is well-developed. He is not diaphoretic.  HENT:     Head: Normocephalic and atraumatic.     Mouth/Throat:     Mouth: Mucous membranes are moist.  Eyes:     General: No scleral icterus.    Pupils: Pupils are equal, round, and reactive to light.  Neck:     Thyroid: No thyromegaly.     Trachea: No tracheal deviation.  Cardiovascular:     Rate and Rhythm: Normal rate and regular rhythm.  Pulmonary:     Effort: Pulmonary effort is normal. No respiratory distress.     Breath sounds: Normal breath sounds. No stridor. No wheezing or rhonchi.  Musculoskeletal:        General: No deformity. Normal range of motion.     Cervical back: No rigidity or tenderness.  Skin:    General: Skin is warm and dry.     Findings: No erythema.  Neurological:     Mental Status: He  is alert.     Coordination: Coordination normal.  Psychiatric:        Behavior: Behavior normal.    Data Reviewed: Sleep study from November 2007 noted Compliance data shows 100% compliance Residual AHI of 3.9  CT scan of the chest with pleural plaques and rounded atelectasis from 08/14/2021-reviewed most recent CT was 08/23/2022 which was reviewed showing rounded atelectasis at the base of the lung, calcified pleural plaques.  Most recent chest x-ray 12/11/2021-reviewed by myself with no acute infiltrative process  I do not see a PFT in the system -Stated he had had one done in [place]  Assessment:  1.  Obstructive sleep apnea-adequately treated -Encouraged to continue BiPAP -Compliance data shows BiPAP is working well  Shortness of breath on exertion -This is multifactorial -Currently on Wixela -Spiriva added during last visit -Has been doing well with his inhalers  For deconditioning -Continues to try to stay active and activity level has actually improved Narcolepsy -Continue modafinil  PTSD history  Rounded atelectasis  Plan/Recommendations:  Continue BiPAP  Refills of modafinil sent to pharmacy  Graded activities as tolerated  Continue current inhalers  Will repeat CT scan of the chest before next visit  Encouraged to call with significant concerns    Virl Diamond MD [facility] 05/04/2023, 9:19 AM  CC: Gwenyth Bender, MD

## 2023-05-04 NOTE — Patient Instructions (Signed)
I will see you back in 6 months  Refills for your modafinil sent to pharmacy  Continue using your inhalers  Call us with significant concerns  Continue graded activities as tolerated  Order CT scan of the chest without contrast to follow-up on rounded atelectasis at the base of the lung

## 2023-07-25 ENCOUNTER — Ambulatory Visit: Payer: Medicare PPO | Attending: Cardiology | Admitting: Cardiology

## 2023-07-26 ENCOUNTER — Encounter: Payer: Self-pay | Attending: Pulmonary Disease | Admitting: Cardiology

## 2023-07-26 ENCOUNTER — Encounter: Payer: Self-pay | Admitting: Cardiology

## 2023-07-26 VITALS — BP 138/66 | HR 92 | Ht 73.5 in | Wt 241.0 lb

## 2023-07-26 DIAGNOSIS — E66811 Obesity, class 1: Secondary | ICD-10-CM

## 2023-07-26 DIAGNOSIS — I10 Essential (primary) hypertension: Secondary | ICD-10-CM | POA: Diagnosis not present

## 2023-07-26 DIAGNOSIS — E1140 Type 2 diabetes mellitus with diabetic neuropathy, unspecified: Secondary | ICD-10-CM

## 2023-07-26 DIAGNOSIS — E782 Mixed hyperlipidemia: Secondary | ICD-10-CM

## 2023-07-26 DIAGNOSIS — Z6831 Body mass index (BMI) 31.0-31.9, adult: Secondary | ICD-10-CM

## 2023-07-26 DIAGNOSIS — I2510 Atherosclerotic heart disease of native coronary artery without angina pectoris: Secondary | ICD-10-CM | POA: Diagnosis not present

## 2023-07-26 DIAGNOSIS — E6609 Other obesity due to excess calories: Secondary | ICD-10-CM

## 2023-07-26 DIAGNOSIS — G4733 Obstructive sleep apnea (adult) (pediatric): Secondary | ICD-10-CM

## 2023-07-26 NOTE — Progress Notes (Signed)
Cardiology Office Note:  .   Date:  07/26/2023  ID:  [name], DOB May 04, 1951, MRN [id number] PCP:  Gwenyth Bender, MD ( also sees Dr. Reita May at [facility]).  Former Cardiology Providers: Dr. [facility] Providers Cardiologist:  Tessa Lerner, DO , [facility] (established care 07/23/2022) Electrophysiologist:  None  Click to update primary MD,subspecialty MD or APP then REFRESH:1}    Chief Complaint  Patient presents with   Follow-up    1 year follow-up    History of Present Illness: .   [name] is a 72 y.o. African-American male whose past medical history and cardiovascular risk factors includes: Nonobstructive coronary artery disease, hypertension, hyperlipidemia, non-insulin-dependent diabetes mellitus type 2, COPD, OSA on BiPAP, advanced age, obesity.   Initially referred to the practice for dyspnea on exertion evaluation in April 2021.  Since then he has undergone appropriate CAD workup including coronary CTA which noted mild CAC and CAD RADS 2 disease and CT FFR was negative for hemodynamically significant stenosis.  Patient presents today for 1 year follow-up visit.  Denies any anginal chest pain or heart failure symptoms.  He is currently working with pulmonary medicine with regards to his COPD management.  Review of Systems: .   Review of Systems  Cardiovascular:  Negative for chest pain, claudication, irregular heartbeat, leg swelling, near-syncope, orthopnea, palpitations, paroxysmal nocturnal dyspnea and syncope.  Respiratory:  Negative for shortness of breath.   Hematologic/Lymphatic: Negative for bleeding problem.    Studies Reviewed:   EKG: 07/26/2023: Sinus tachycardia, 102 bpm, without underlying ischemia injury pattern.  Echocardiogram: 12/18/2019: LVEF 55-60%, mild LVH, grade 2 diastolic impairment, mildly dilated left atrium, slightly dilated right atrium, mild MR, mild TR.   Stress Testing: Lexiscan/modified Bruce Tetrofosmin stress  test 12/24/2019: Normal stress myocardial perfusion. Stress LVEF calculated 46%, although visually appears normal.   CCTA 06/16/2021: 1. Total coronary calcium score of 89.6. This was 65th percentile  for age and sex matched control.   2. Normal coronary origin with right dominance.   3. CAD-RADS = 2 Mild non-obstructive CAD (25-49%). Minimal stenosis (0-24%) distal left main due to calcified plaque otherwise no significant stenosis.   Minimal stenosis (0-24%) due to mixed plaque in the proximal LAD, mild stenosis (25-49%) due to mixed plaque mid/distal LAD, otherwise no significant plaque or stenosis.   Ramus intermedius: Patent.   LCX is patent with no evidence of plaque or stenosis.   Minimal calcified plaque (0-24%) proximal RCA otherwise no significant evidence of plaque or stenosis.   4. CT-FFR negative for hemodynamically significant stenosis.    5. Moderate-sized hiatal hernia.   6. Evidence of prior asbestos exposure and related lung disease with multiple areas of calcified pleural plaque bilaterally and an area of elongated pulmonary density in the posteromedial left lower lobe with associated architectural distortion measuring 2 x 2.8 x 7.9 cm. 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 Given the impressive nature of this area of atelectasis/scarring as well as extensive scarring elsewhere in the left lung and calcified pleural plaques, this may be worth correlating with pulmonary function tests and following by Pulmonology.  RADIOLOGY: NA  Risk Assessment/Calculations:   NA   Labs:       Latest Ref Rng & Units 01/05/2023   11:50 AM 10/12/2022    5:49 AM 12/11/2021    2:47  AM  CBC  WBC 4.0 - 10.5 K/uL 6.6  7.6  8.4   Hemoglobin 13.0 - 17.0 g/dL 54.0  98.1  19.1   Hematocrit 39.0 - 52.0 % 41.2  40.4  41.5   Platelets  150 - 400 K/uL 277  302  273        Latest Ref Rng & Units 01/05/2023   11:50 AM 10/12/2022    5:49 AM 08/13/2022   10:17 AM  BMP  Glucose 70 - 99 mg/dL 478  295  84   BUN 8 - 23 mg/dL 26  15  17    Creatinine 0.61 - 1.24 mg/dL 6.21  3.08  6.57   BUN/Creat Ratio 10 - 24   13   Sodium 135 - 145 mmol/L 140  140  142   Potassium 3.5 - 5.1 mmol/L 4.2  3.6  4.4   Chloride 98 - 111 mmol/L 104  104  103   CO2 22 - 32 mmol/L 27  25  23    Calcium 8.9 - 10.3 mg/dL 9.7  9.5  9.6       Latest Ref Rng & Units 01/05/2023   11:50 AM 10/12/2022    5:49 AM 08/13/2022   10:17 AM  CMP  Glucose 70 - 99 mg/dL 846  962  84   BUN 8 - 23 mg/dL 26  15  17    Creatinine 0.61 - 1.24 mg/dL 9.52  8.41  3.24   Sodium 135 - 145 mmol/L 140  140  142   Potassium 3.5 - 5.1 mmol/L 4.2  3.6  4.4   Chloride 98 - 111 mmol/L 104  104  103   CO2 22 - 32 mmol/L 27  25  23    Calcium 8.9 - 10.3 mg/dL 9.7  9.5  9.6     No results found for: "CHOL", "HDL", "LDLCALC", "LDLDIRECT", "TRIG", "CHOLHDL" No results for input(s): "LIPOA" in the last 8760 hours. No components found for: "NTPROBNP" No results for input(s): "PROBNP" in the last 8760 hours. Recent Labs    08/20/22 1126  TSH 1.05    External Labs: Collected: September 2024 available in [facility] Everywhere under [facility] Hemoglobin 12.7 A1c 6.7. TSH 1.2. BUN 17, creatinine 1.5. GFR 50 mL/min Total cholesterol 137, triglycerides 102, HDL 61, LDL calculated 53  Physical Exam:    Today's Vitals   07/26/23 0903  BP: 138/66  Pulse: 92  SpO2: 94%  Weight: 241 lb (109.3 kg)  Height: 6' 1.5" (1.867 m)   Body mass index is 31.36 kg/m. Wt Readings from Last 3 Encounters:  07/26/23 241 lb (109.3 kg)  05/04/23 244 lb (110.7 kg)  04/29/23 243 lb (110.2 kg)    Physical Exam  Constitutional: No distress.  Age appropriate, hemodynamically stable.   Neck: No JVD present.  Cardiovascular: Normal rate, regular rhythm, S1 normal, S2 normal, intact distal pulses and  normal pulses. Exam reveals no gallop, no S3 and no S4.  No murmur heard. Pulmonary/Chest: Effort normal. No stridor. He has no wheezes. He has no rales.  Decreased breath sounds bilateral  Abdominal: Soft. Bowel sounds are normal. He exhibits no distension. There is no abdominal tenderness.  Musculoskeletal:        General: No edema.     Cervical back: Neck supple.     Comments: Bilateral foot braces  Neurological: He is alert and oriented to person, place, and time. He has intact cranial nerves (2-12).  Skin: Skin is warm and moist.  Impression & Recommendation(s):  Impression:   ICD-10-CM   1. Nonobstructive atherosclerosis of coronary artery  I25.10     2. Essential hypertension  I10     3. Mixed hyperlipidemia  E78.2     4. Type 2 diabetes mellitus with diabetic neuropathy, without long-term current use of insulin (HCC)  E11.40     5. OSA treated with BiPAP  G47.33     6. Class 1 obesity due to excess calories with serious comorbidity and body mass index (BMI) of 31.0 to 31.9 in adult  E66.811    E66.09    Z68.31        Recommendation(s):  Nonobstructive atherosclerosis of coronary artery Total CAC 89.6, 65th percentile. Mild nonobstructive CAD per coronary CTA from November 2022. Continue aspirin 81 mg p.o. daily. Continue statin therapy. Outside labs from September 2024 performed at the [facility] reviewed via Care Everywhere.   LDL is currently at goal.  A1c is well-controlled Patient denies any exertional chest pain or heart failure symptoms. Reemphasized the importance of secondary prevention with focus on improving her modifiable cardiovascular risk factors such as glycemic control, lipid management, blood pressure control, weight loss.  Essential hypertension Office blood pressures are very well-controlled. Continue amlodipine 10 mg p.o. daily. Continue Jardiance 25 mg half a tablet in the morning. Continue losartan 100 mg p.o. every afternoon. Continue  spironolactone 50 mg p.o. daily.  Mixed hyperlipidemia Currently on pravastatin.   He denies myalgia or other side effects. Most recent lipids dated September 2024, independently reviewed as noted above. Records from Care Everywhere reviewed.  Type 2 diabetes mellitus with diabetic neuropathy, without long-term current use of insulin (HCC) Re emphasize importance of glycemic control. Most recent hemoglobin A1c as of September 2024 very well-controlled  OSA treated with BiPAP Endorses compliance with his device therapy on a regular basis. Prior imaging studies have noted pulmonary findings currently follows with pulmonary medicine.  Class 1 obesity due to excess calories with serious comorbidity and body mass index (BMI) of 31.0 to 31.9 in adult Body mass index is 31.36 kg/m. I reviewed with him importance of diet, regular physical activity/exercise, weight loss.   Patient is educated on the importance of increasing physical activity gradually as tolerated with a goal of moderate intensity exercise for 30 minutes a day 5 days a week.  Orders Placed:  No orders of the defined types were placed in this encounter.  Final Medication List:   No orders of the defined types were placed in this encounter.   There are no discontinued medications.   Current Outpatient Medications:    albuterol (PROVENTIL) (2.5 MG/3ML) 0.083% nebulizer solution, Take 3 mLs (2.5 mg total) by nebulization every 4 (four) hours as needed for wheezing or shortness of breath., Disp: 1080 mL, Rfl: 2   albuterol (VENTOLIN HFA) 108 (90 Base) MCG/ACT inhaler, Inhale 2 puffs into the lungs every 6 (six) hours as needed for wheezing or shortness of breath., Disp: 8 g, Rfl: 5   amLODipine (NORVASC) 10 MG tablet, Take 10 mg by mouth daily., Disp: , Rfl:    aspirin EC 81 MG tablet, Take 81 mg by mouth daily. Swallow whole., Disp: , Rfl:    b complex vitamins capsule, Take 1 capsule by mouth daily., Disp: , Rfl:    buPROPion  (WELLBUTRIN SR) 150 MG 12 hr tablet, Take 150 mg by mouth daily., Disp: , Rfl:    carbamide peroxide (DEBROX) 6.5 % OTIC solution, Place 5 drops into both ears  as needed (ear wax)., Disp: , Rfl:    Cholecalciferol (VITAMIN D3) 1000 UNITS CAPS, Take 1,000 Units by mouth every morning., Disp: , Rfl:    empagliflozin (JARDIANCE) 25 MG TABS tablet, Take 25 mg by mouth daily. Take 1/2 tablet every morning, Disp: , Rfl:    FERGON 240 (27 Fe) MG tablet, Take 240 mg by mouth daily., Disp: , Rfl:    fexofenadine (ALLEGRA) 180 MG tablet, Take 180 mg by mouth daily., Disp: , Rfl:    fluticasone (FLONASE) 50 MCG/ACT nasal spray, Place 1 spray into both nostrils in the morning and at bedtime., Disp: , Rfl:    fluticasone-salmeterol (WIXELA INHUB) 250-50 MCG/ACT AEPB, Inhale 1 puff into the lungs in the morning and at bedtime., Disp: 1 each, Rfl: 5   fluticasone-salmeterol (WIXELA INHUB) 250-50 MCG/ACT AEPB, Inhale 1 puff into the lungs in the morning and at bedtime., Disp: , Rfl:    gabapentin (NEURONTIN) 400 MG capsule, Take 400 mg by mouth 3 (three) times daily., Disp: , Rfl:    hydrALAZINE (APRESOLINE) 50 MG tablet, Take 50 mg by mouth 3 (three) times daily., Disp: , Rfl:    hydrOXYzine (ATARAX/VISTARIL) 10 MG tablet, Take 20 mg by mouth at bedtime., Disp: , Rfl:    losartan (COZAAR) 100 MG tablet, Take 100 mg by mouth every evening., Disp: , Rfl:    melatonin 3 MG TABS tablet, Take 3 mg by mouth at bedtime., Disp: , Rfl:    metFORMIN (GLUCOPHAGE) 500 MG tablet, Take 500 mg by mouth daily with breakfast., Disp: , Rfl:    modafinil (PROVIGIL) 100 MG tablet, Take 200 mg (2 tablets) in the morning and 100 mg (one tablet) at noon as directed, Disp: 90 tablet, Rfl: 2   NON FORMULARY, BIPAP at bedtime, Disp: , Rfl:    omeprazole (PRILOSEC OTC) 20 MG tablet, Take 1 tablet (20 mg total) by mouth daily., Disp: 30 tablet, Rfl: 2   PARoxetine (PAXIL) 40 MG tablet, Take 40 mg by mouth at bedtime., Disp: , Rfl:     Polyethyl Glycol-Propyl Glycol (SYSTANE OP), Apply to eye. par4n, Disp: , Rfl:    pravastatin (PRAVACHOL) 40 MG tablet, Take 40 mg by mouth at bedtime., Disp: , Rfl:    Probiotic Product (PROBIOTIC DAILY PO), Take 1 capsule by mouth daily., Disp: , Rfl:    QUEtiapine (SEROQUEL) 25 MG tablet, Take 50 mg by mouth at bedtime., Disp: , Rfl:    spironolactone (ALDACTONE) 25 MG tablet, Take 50 mg by mouth daily. , Disp: , Rfl:    tamsulosin (FLOMAX) 0.4 MG CAPS capsule, Take 0.4 mg by mouth every morning., Disp: , Rfl:    Tiotropium Bromide Monohydrate (SPIRIVA RESPIMAT) 2.5 MCG/ACT AERS, Inhale 2 puffs into the lungs daily., Disp: 1 g, Rfl: 5   Tiotropium Bromide-Olodaterol (STIOLTO RESPIMAT) 2.5-2.5 MCG/ACT AERS, Inhale into the lungs. 2 puffs daily, Disp: , Rfl:    triamcinolone ointment (KENALOG) 0.1 %, Apply 1 application  topically daily as needed (rash)., Disp: , Rfl:    Turmeric 400 MG CAPS, Take 400 mg by mouth daily., Disp: , Rfl:    vitamin E 400 UNIT capsule, Take 400 Units by mouth daily., Disp: , Rfl:   Consent:   None  Disposition:   1 year follow-up sooner if needed Patient may be asked to follow-up sooner based on the results of the above-mentioned testing.  His questions and concerns were addressed to his satisfaction. He voices understanding of the recommendations provided during  this encounter.    Signed, Tessa Lerner, DO, [facility] [place]  [facility]  [address] [place], [name] [address] 07/26/2023 9:27 AM

## 2023-07-26 NOTE — Patient Instructions (Signed)
Medication Instructions:  Your physician recommends that you continue on your current medications as directed. Please refer to the Current Medication list given to you today.  *If you need a refill on your cardiac medications before your next appointment, please call your pharmacy*  Lab Work: None ordered today. If you have labs (blood work) drawn today and your tests are completely normal, you will receive your results only by: MyChart Message (if you have MyChart) OR A paper copy in the mail If you have any lab test that is abnormal or we need to change your treatment, we will call you to review the results.  Testing/Procedures: None ordered today.  Follow-Up: At [facility], you and your health needs are our priority.  As part of our continuing mission to provide you with exceptional heart care, we have created designated Provider Care Teams.  These Care Teams include your primary Cardiologist (physician) and Advanced Practice Providers (APPs -  Physician Assistants and Nurse Practitioners) who all work together to provide you with the care you need, when you need it.  Your next appointment:   11 month(s) (November 2025)  The format for your next appointment:   In Person  Provider:   Tessa Lerner, DO {

## 2023-08-12 ENCOUNTER — Telehealth: Payer: Medicare PPO | Admitting: Pulmonary Disease

## 2023-08-12 ENCOUNTER — Telehealth: Payer: Self-pay | Admitting: Pulmonary Disease

## 2023-08-12 DIAGNOSIS — G4733 Obstructive sleep apnea (adult) (pediatric): Secondary | ICD-10-CM

## 2023-08-12 DIAGNOSIS — G47419 Narcolepsy without cataplexy: Secondary | ICD-10-CM

## 2023-08-12 DIAGNOSIS — J441 Chronic obstructive pulmonary disease with (acute) exacerbation: Secondary | ICD-10-CM | POA: Diagnosis not present

## 2023-08-12 DIAGNOSIS — J449 Chronic obstructive pulmonary disease, unspecified: Secondary | ICD-10-CM

## 2023-08-12 MED ORDER — DOXYCYCLINE HYCLATE 100 MG PO TABS: 100.0000 mg | ORAL_TABLET | Freq: Two times a day (BID) | ORAL | 0 refills | Status: DC

## 2023-08-12 MED ORDER — PREDNISONE 20 MG PO TABS: 20.0000 mg | ORAL_TABLET | Freq: Every day | ORAL | 0 refills | Status: DC

## 2023-08-12 NOTE — Progress Notes (Signed)
Virtual Visit via Video Note  I connected with [name] on 08/12/23 at  2:45 PM EST by a video enabled telemedicine application and verified that I am speaking with the correct person using two identifiers.  Location: Patient: Patient is at home Provider: In the office on [address].   I discussed the limitations of evaluation and management by telemedicine and the availability of in person appointments. The patient expressed understanding and agreed to proceed.  History of Present Illness: Patient with a history of obstructive sleep apnea, narcolepsy, obstructive lung disease  Remains on bronchodilators, uses BiPAP nightly  Has been coughing, feels congested, chest pressure Occasional chills   Observations/Objective:  Does appear comfortable, does not appear to be in extremities Assessment and Plan: Bronchitic exacerbation of COPD  Narcolepsy  Obstructive sleep apnea  Continue BiPAP therapy  Will call in a prescription for doxycycline and prednisone  Schedule for pulmonary function test in the [place] system  Follow Up Instructions:  Follow-up in 3 months  I discussed the assessment and treatment plan with the patient. The patient was provided an opportunity to ask questions and all were answered. The patient agreed with the plan and demonstrated an understanding of the instructions.   The patient was advised to call back or seek an in-person evaluation if the symptoms worsen or if the condition fails to improve as anticipated.  I provided 21 minutes of non-face-to-face time during this encounter.   Tomma Lightning, MD

## 2023-08-12 NOTE — Telephone Encounter (Signed)
Kindly schedule PFT in the [place] system for this patient

## 2023-08-19 NOTE — Telephone Encounter (Signed)
 Order for PFT was placed per Dr Val Eagle.

## 2023-09-02 ENCOUNTER — Telehealth: Payer: Self-pay | Admitting: Pulmonary Disease

## 2023-09-02 NOTE — Telephone Encounter (Signed)
[name] wife states patient needs refill for Modafinil. Pharmacy is [facility]. [name] phone number is [phone].

## 2023-09-11 DIAGNOSIS — E1142 Type 2 diabetes mellitus with diabetic polyneuropathy: Secondary | ICD-10-CM | POA: Diagnosis not present

## 2023-09-12 NOTE — Telephone Encounter (Signed)
ATC patient's wife (DPR), LVM to return call.  Message sent to Dr. Wynona Neat regarding refill request.  Dr. Wynona Neat, please advise regarding request for refill for Modafinil.  Thank you.

## 2023-09-13 ENCOUNTER — Encounter: Payer: Self-pay | Admitting: Pulmonary Disease

## 2023-09-13 ENCOUNTER — Ambulatory Visit (INDEPENDENT_AMBULATORY_CARE_PROVIDER_SITE_OTHER): Payer: No Typology Code available for payment source | Admitting: Pulmonary Disease

## 2023-09-13 VITALS — BP 138/64 | HR 85 | Temp 98.0°F | Ht 73.5 in | Wt 232.2 lb

## 2023-09-13 DIAGNOSIS — J441 Chronic obstructive pulmonary disease with (acute) exacerbation: Secondary | ICD-10-CM | POA: Diagnosis not present

## 2023-09-13 DIAGNOSIS — G47419 Narcolepsy without cataplexy: Secondary | ICD-10-CM

## 2023-09-13 DIAGNOSIS — J449 Chronic obstructive pulmonary disease, unspecified: Secondary | ICD-10-CM | POA: Diagnosis not present

## 2023-09-13 DIAGNOSIS — G4733 Obstructive sleep apnea (adult) (pediatric): Secondary | ICD-10-CM | POA: Diagnosis not present

## 2023-09-13 DIAGNOSIS — R918 Other nonspecific abnormal finding of lung field: Secondary | ICD-10-CM

## 2023-09-13 DIAGNOSIS — I2510 Atherosclerotic heart disease of native coronary artery without angina pectoris: Secondary | ICD-10-CM | POA: Diagnosis not present

## 2023-09-13 DIAGNOSIS — R0602 Shortness of breath: Secondary | ICD-10-CM | POA: Diagnosis not present

## 2023-09-13 DIAGNOSIS — M50121 Cervical disc disorder at C4-C5 level with radiculopathy: Secondary | ICD-10-CM | POA: Diagnosis not present

## 2023-09-13 DIAGNOSIS — F5221 Male erectile disorder: Secondary | ICD-10-CM | POA: Diagnosis not present

## 2023-09-13 DIAGNOSIS — I119 Hypertensive heart disease without heart failure: Secondary | ICD-10-CM | POA: Diagnosis not present

## 2023-09-13 DIAGNOSIS — E1122 Type 2 diabetes mellitus with diabetic chronic kidney disease: Secondary | ICD-10-CM | POA: Diagnosis not present

## 2023-09-13 DIAGNOSIS — I872 Venous insufficiency (chronic) (peripheral): Secondary | ICD-10-CM | POA: Diagnosis not present

## 2023-09-13 DIAGNOSIS — F0671 Mild neurocognitive disorder due to known physiological condition with behavioral disturbance: Secondary | ICD-10-CM | POA: Diagnosis not present

## 2023-09-13 MED ORDER — STIOLTO RESPIMAT 2.5-2.5 MCG/ACT IN AERS: 2.0000 | INHALATION_SPRAY | Freq: Every day | RESPIRATORY_TRACT | 5 refills | Status: DC

## 2023-09-13 MED ORDER — MODAFINIL 100 MG PO TABS: ORAL_TABLET | ORAL | 5 refills | Status: AC

## 2023-09-13 NOTE — Patient Instructions (Signed)
I will see you back in about 6 months  Continue using your BiPAP on a nightly basis  Will send in refills for your inhalers  Will send in refills for your modafinil  Your CAT scan is stable from previous  We can consider repeating this in a year

## 2023-09-13 NOTE — Progress Notes (Signed)
[name]    [id number]    May 15, 1951  Primary Care Physician:Dean, Lind Guest, MD  Referring Physician: Gwenyth Bender, MD [address] 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   albuterol (VENTOLIN HFA) 108 (90 Base) MCG/ACT inhaler Inhale 2 puffs into the lungs every 6 (six) hours as needed for wheezing or shortness of breath.   amLODipine (NORVASC) 10 MG tablet Take 10 mg by mouth daily.   aspirin EC 81 MG tablet Take 81 mg by mouth daily. Swallow whole.   b complex vitamins capsule Take 1 capsule by mouth daily.   buPROPion (WELLBUTRIN SR) 150 MG 12 hr tablet Take 150 mg by mouth daily.   carbamide peroxide (DEBROX) 6.5 % OTIC solution Place 5 drops into both ears as needed (ear wax).   Cholecalciferol (VITAMIN D3) 1000 UNITS CAPS Take 1,000 Units by mouth every morning.   doxycycline (VIBRA-TABS) 100 MG tablet Take 1 tablet (100 mg total) by mouth 2 (two)  times daily.   empagliflozin (JARDIANCE) 25 MG TABS tablet Take 25 mg by mouth daily. Take 1/2 tablet every morning   FERGON 240 (27 Fe) MG tablet Take 240 mg by mouth daily.   fexofenadine (ALLEGRA) 180 MG tablet Take 180 mg by mouth daily.   fluticasone (FLONASE) 50 MCG/ACT nasal spray Place 1 spray into both nostrils in the morning and at bedtime.   fluticasone-salmeterol (WIXELA INHUB) 250-50 MCG/ACT AEPB Inhale 1 puff into the lungs in the morning and at bedtime.   fluticasone-salmeterol (WIXELA INHUB) 250-50 MCG/ACT AEPB Inhale 1 puff into the lungs in the morning and at bedtime.   gabapentin (NEURONTIN) 400 MG capsule Take 400 mg by mouth 3 (three) times daily.   hydrALAZINE (APRESOLINE) 50 MG tablet Take 50 mg by mouth 3 (three) times daily.   hydrOXYzine (ATARAX/VISTARIL) 10 MG tablet Take 20 mg by mouth at bedtime.   losartan (COZAAR) 100 MG tablet Take 100 mg by mouth every evening.   melatonin 3 MG TABS tablet Take 3 mg by mouth at bedtime.   metFORMIN (GLUCOPHAGE) 500 MG tablet Take 500 mg by mouth daily with breakfast.   modafinil (PROVIGIL) 100 MG tablet Take 200 mg (2 tablets) in the morning and 100 mg (one tablet) at noon as directed   NON FORMULARY BIPAP at bedtime   omeprazole (PRILOSEC OTC) 20 MG tablet Take 1 tablet (20 mg total) by mouth daily.   PARoxetine (PAXIL) 40 MG tablet  Take 40 mg by mouth at bedtime.   Polyethyl Glycol-Propyl Glycol (SYSTANE OP) Apply to eye. par4n   pravastatin (PRAVACHOL) 40 MG tablet Take 40 mg by mouth at bedtime.   predniSONE (DELTASONE) 20 MG tablet Take 1 tablet (20 mg total) by mouth daily with breakfast.   Probiotic Product (PROBIOTIC DAILY PO) Take 1 capsule by mouth daily.   QUEtiapine (SEROQUEL) 25 MG tablet Take 50 mg by mouth at bedtime.   spironolactone (ALDACTONE) 25 MG tablet Take 50 mg by mouth daily.    tamsulosin (FLOMAX) 0.4 MG CAPS capsule Take 0.4 mg by mouth every morning.   Tiotropium Bromide Monohydrate (SPIRIVA  RESPIMAT) 2.5 MCG/ACT AERS Inhale 2 puffs into the lungs daily.   Tiotropium Bromide-Olodaterol (STIOLTO RESPIMAT) 2.5-2.5 MCG/ACT AERS Inhale into the lungs. 2 puffs daily   triamcinolone ointment (KENALOG) 0.1 % Apply 1 application  topically daily as needed (rash).   Turmeric 400 MG CAPS Take 400 mg by mouth daily.   vitamin E 400 UNIT capsule Take 400 Units by mouth daily.   No facility-administered encounter medications on file as of 09/13/2023.    Allergies as of 09/13/2023 - Review Complete 07/26/2023  Allergen Reaction Noted   Misc. sulfonamide containing compounds Other (See Comments) 07/23/2022   Ace inhibitors Swelling 08/27/2011   Sulfa antibiotics Swelling 08/27/2011    Past Medical History:  Diagnosis Date   Abnormal CT scan of lung 07/23/2021   Abnormality of gait    Allergic rhinitis due to pollen 11/25/2010   Bilateral primary osteoarthritis of hip 12/12/2019   Carpal tunnel syndrome on right    Contact with and (suspected) exposure to asbestos 07/23/2021   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   Hyperlipidemia    Hypothyroidism    Klippel-Feil sequence    Learning disability, unspecified 1997   Reading/reading comprehension   Left inguinal hernia    Low back pain 12/12/2019   Lumbar spondylosis    Major depressive disorder    Mild cognitive impairment 10/18/2022   Nonspecific abnormal electrocardiogram (ECG) (EKG)    Obstructive sleep apnea 12/15/2015   Osteoarthritis    Osteoporosis    Pain in joints of right  hand    Peripheral neuropathy    Peripheral venous insufficiency    Pituitary abnormality    pituitary edema; takes bromocriptine   PTSD (post-traumatic stress disorder) 11/25/2010   Spinal stenosis of lumbar region 09/14/2018   Type 2 diabetes mellitus without complications 11/25/2010   Vitamin D deficiency    Weakness of left leg    Wears glasses     Past Surgical History:  Procedure Laterality Date   CARDIAC CATHETERIZATION     CARPAL TUNNEL RELEASE Right 10/12/2022   Procedure: Right CARPAL TUNNEL RELEASE;  Surgeon: Coletta Memos, MD;  Location: [facility] OR;  Service: Neurosurgery;  Laterality: Right;  RM 20   CERVICAL DISC SURGERY     x 2   COLONOSCOPY     HIP ARTHROPLASTY     right   INGUINAL HERNIA REPAIR Left 09/06/2019  Procedure: LAPAROSCOPIC LEFT INGUINAL HERNIA WITH MESH;  Surgeon: Gaynelle Adu, MD;  [facility];  Service: General;  Laterality: Left;   JOINT REPLACEMENT     KNEE ARTHROPLASTY     bilateral   LUMBAR LAMINECTOMY/DECOMPRESSION MICRODISCECTOMY  08/31/2011   Procedure: LUMBAR LAMINECTOMY/DECOMPRESSION MICRODISCECTOMY;  Surgeon: Clydene Fake, MD;  Location: [facility];  Service: Neurosurgery;  Laterality: N/A;  LumbarThree to Lumbar Five Laminectomy, possible Lumbar Four-Five Diskectomy   SHOULDER SURGERY     left   UPPER GI ENDOSCOPY      Family History  Problem Relation Age of Onset   Diabetes Mother    Alzheimer's disease Mother    Depression Father    ALS Father    ALS Brother    Hypertension Brother    Hypertension Brother    Cancer Brother     Social History   Socioeconomic History   Marital status: Married    Spouse name: Not on file   Number of children: 2   Years of education: 14   Highest education level: Associate degree: occupational, Scientist, product/process development, or vocational program  Occupational History   Occupation: Retired    Comment: [employer]  Tobacco Use   Smoking status: Never   Smokeless tobacco: Never  Vaping  Use   Vaping status: Never Used  Substance and Sexual Activity   Alcohol use: No   Drug use: No   Sexual activity: Not Currently  Other Topics Concern   Not on file  Social History Narrative   Left handed   Drinks caffeine, soda   Lives with wife   retired   [employer] Executive [employer] Strain: Not on file  Food Insecurity: Not on file  Transportation Needs: Not on file  Physical Activity: Not on file  Stress: Not on file  Social Connections: Unknown (01/07/2022)   Received from [facility], [facility]   Social Network    Social Network: Not on file  Intimate Partner Violence: Unknown (01/07/2022)   Received from [facility], [facility]   HITS    Physically Hurt: Not on file    Insult or Talk Down To: Not on file    Threaten Physical Harm: Not on file    Scream or Curse: Not on file   Review of Systems  Constitutional:  Negative for activity change and appetite change.  HENT: Negative.    Eyes: Negative.   Respiratory:  Positive for apnea and shortness of breath. Negative for cough and choking.   Cardiovascular: Negative.   Endocrine: Negative.   Genitourinary: Negative.   Allergic/Immunologic: Negative.   Neurological: Negative.   Hematological: Negative.   Psychiatric/Behavioral:  Positive for sleep disturbance.     There were no vitals filed for this visit.   Physical Exam Constitutional:      General: He is not in acute distress.    Appearance: He is well-developed. He is not diaphoretic.  HENT:     Head: Normocephalic and atraumatic.     Mouth/Throat:     Mouth: Mucous membranes are moist.  Eyes:     General: No scleral icterus.    Pupils: Pupils are equal, round, and reactive to light.  Neck:     Thyroid: No thyromegaly.     Trachea: No tracheal deviation.  Cardiovascular:     Rate and Rhythm: Normal rate and regular rhythm.  Pulmonary:     Effort: Pulmonary effort is normal. No respiratory distress.  Breath sounds:  Normal breath sounds. No stridor. No wheezing or rhonchi.  Musculoskeletal:        General: No deformity. Normal range of motion.     Cervical back: No rigidity or tenderness.  Skin:    General: Skin is warm and dry.     Findings: No erythema.  Neurological:     Mental Status: He is alert.     Coordination: Coordination normal.  Psychiatric:        Behavior: Behavior normal.   Data Reviewed: Sleep study from November 2007 noted Compliance data shows 100% compliance Residual AHI of 3.9  most recent chest x-ray 12/11/2021-reviewed by myself with no acute infiltrative process  His most recent CT scan was reviewed with him in the office today compared to 1 performed 08/14/2021  I do not see a PFT in the system -Stated he had had one done in [place]  Assessment:  .  His obstructive sleep apnea appears well treated -Encouraged to continue BiPAP  .  Chronic shortness of breath with exertion -Some of this is multifactorial and may be related to deconditioning -He does have underlying obstructive lung disease for which she is on Stiolto -Has been doing well using inhalers  .  Narcolepsy -Continue modafinil  History of PTSD  He has rounded atelectasis Current CT shows stability compared to a CT performed in 2022   Plan/Recommendations:  Encouraged to continue BiPAP nightly  Graded activities as tolerated  Refills for modafinil sent to pharmacy  Continue his current inhalers  We can consider repeating his CT PE a year from now  Call with significant concerns   Virl Diamond MD [facility] 09/13/2023, 12:58 PM  CC: Gwenyth Bender, MD

## 2023-09-14 NOTE — Telephone Encounter (Signed)
Modafinil refilled 1/28

## 2023-09-19 DIAGNOSIS — G5602 Carpal tunnel syndrome, left upper limb: Secondary | ICD-10-CM | POA: Diagnosis not present

## 2023-09-19 DIAGNOSIS — M47812 Spondylosis without myelopathy or radiculopathy, cervical region: Secondary | ICD-10-CM | POA: Diagnosis not present

## 2023-09-21 ENCOUNTER — Telehealth: Payer: Self-pay | Admitting: Pulmonary Disease

## 2023-09-21 NOTE — Telephone Encounter (Signed)
 Atc pt. lmtcb

## 2023-09-21 NOTE — Telephone Encounter (Signed)
 Patient's medication was sent to the wrong pharmacy. Stiolo Respimat needs to be sent to [place] in [place].

## 2023-09-22 ENCOUNTER — Other Ambulatory Visit (HOSPITAL_BASED_OUTPATIENT_CLINIC_OR_DEPARTMENT_OTHER): Payer: Self-pay

## 2023-09-22 ENCOUNTER — Encounter (HOSPITAL_BASED_OUTPATIENT_CLINIC_OR_DEPARTMENT_OTHER): Payer: Self-pay

## 2023-09-22 MED ORDER — STIOLTO RESPIMAT 2.5-2.5 MCG/ACT IN AERS: 2.0000 | INHALATION_SPRAY | Freq: Every day | RESPIRATORY_TRACT | 5 refills | Status: DC

## 2023-09-22 NOTE — Telephone Encounter (Signed)
Detailed message left on identifiable machine belonging to the patient. Advised to call with any questions or concerns.

## 2023-09-22 NOTE — Telephone Encounter (Signed)
 Wife of patient is trying to return missed call. She can be reached at [phone]

## 2023-10-20 ENCOUNTER — Telehealth: Payer: Self-pay | Admitting: Pulmonary Disease

## 2023-10-20 DIAGNOSIS — J449 Chronic obstructive pulmonary disease, unspecified: Secondary | ICD-10-CM

## 2023-10-20 NOTE — Telephone Encounter (Signed)
 Patient's nebulizer has stopped working and he needs a new one to be ordered and submitted to the [facility].

## 2023-10-20 NOTE — Telephone Encounter (Signed)
 Order placed.   LVM  [facility] message sent to advise

## 2023-10-20 NOTE — Telephone Encounter (Signed)
Needs a order

## 2023-10-25 ENCOUNTER — Ambulatory Visit: Payer: Self-pay | Admitting: Cardiology

## 2023-11-30 ENCOUNTER — Ambulatory Visit (HOSPITAL_BASED_OUTPATIENT_CLINIC_OR_DEPARTMENT_OTHER)
Admission: RE | Admit: 2023-11-30 | Discharge: 2023-11-30 | Disposition: A | Discharge status: Home / Self Care | Source: Ambulatory Visit | Attending: Pulmonary Disease | Admitting: Pulmonary Disease

## 2023-11-30 ENCOUNTER — Other Ambulatory Visit (HOSPITAL_BASED_OUTPATIENT_CLINIC_OR_DEPARTMENT_OTHER)

## 2023-11-30 DIAGNOSIS — J449 Chronic obstructive pulmonary disease, unspecified: Secondary | ICD-10-CM | POA: Diagnosis present

## 2023-12-10 DIAGNOSIS — E1142 Type 2 diabetes mellitus with diabetic polyneuropathy: Secondary | ICD-10-CM | POA: Diagnosis not present

## 2023-12-12 NOTE — Progress Notes (Signed)
 Your CT scan showed rounded atelectasis at the base of the lung, unchanged from the one performed in 2024

## 2023-12-13 ENCOUNTER — Telehealth: Payer: Self-pay

## 2023-12-13 DIAGNOSIS — I2510 Atherosclerotic heart disease of native coronary artery without angina pectoris: Secondary | ICD-10-CM | POA: Diagnosis not present

## 2023-12-13 DIAGNOSIS — F0671 Mild neurocognitive disorder due to known physiological condition with behavioral disturbance: Secondary | ICD-10-CM | POA: Diagnosis not present

## 2023-12-13 DIAGNOSIS — M50121 Cervical disc disorder at C4-C5 level with radiculopathy: Secondary | ICD-10-CM | POA: Diagnosis not present

## 2023-12-13 DIAGNOSIS — F5221 Male erectile disorder: Secondary | ICD-10-CM | POA: Diagnosis not present

## 2023-12-13 DIAGNOSIS — I872 Venous insufficiency (chronic) (peripheral): Secondary | ICD-10-CM | POA: Diagnosis not present

## 2023-12-13 DIAGNOSIS — R1032 Left lower quadrant pain: Secondary | ICD-10-CM | POA: Diagnosis not present

## 2023-12-13 DIAGNOSIS — I119 Hypertensive heart disease without heart failure: Secondary | ICD-10-CM | POA: Diagnosis not present

## 2023-12-13 DIAGNOSIS — G4733 Obstructive sleep apnea (adult) (pediatric): Secondary | ICD-10-CM | POA: Diagnosis not present

## 2023-12-13 DIAGNOSIS — J441 Chronic obstructive pulmonary disease with (acute) exacerbation: Secondary | ICD-10-CM | POA: Diagnosis not present

## 2023-12-13 DIAGNOSIS — E1122 Type 2 diabetes mellitus with diabetic chronic kidney disease: Secondary | ICD-10-CM | POA: Diagnosis not present

## 2023-12-13 NOTE — Telephone Encounter (Signed)
 Copied from CRM [id number]. Topic: Clinical - Lab/Test Results >> Dec 09, 2023  3:16 PM [name] wrote: Reason for CRM: [name] of [facility] CT scan done 4/16 faxed to them.  [name] Fax  [phone] Cb [phone] ext [phone]      [name] know [facility] has not been read and radiology is a few weeks for results .

## 2024-01-06 ENCOUNTER — Telehealth: Payer: Self-pay | Admitting: *Deleted

## 2024-01-06 MED ORDER — PREDNISONE 20 MG PO TABS: ORAL_TABLET | ORAL | 1 refills | Status: DC

## 2024-01-06 MED ORDER — AZITHROMYCIN 250 MG PO TABS: 250.0000 mg | ORAL_TABLET | Freq: Every day | ORAL | 0 refills | Status: DC

## 2024-01-06 NOTE — Telephone Encounter (Signed)
 Spoke to wife Zpak/ prednisone  sent

## 2024-01-06 NOTE — Addendum Note (Signed)
 Addended by: Lind Repine on: 01/06/2024 03:37 PM   Modules accepted: Orders

## 2024-01-06 NOTE — Telephone Encounter (Signed)
 I called and spoke with the pt's spouse Pt has had increased SOB, cough with green sputum and wheezing over the past 2 days  He is using his stiolto, wixela, allegra daily  He has been using his albuterol  neb and inhaler both about 4 x daily each- he feels it's not giving relief  Pt has not had any fevers, aches  I advised that he may need to call PCP for appt or go to UC/ED since the office here is closed and will be until 01/10/24  Routing to provider on call  Please advise, thanks!  Allergies  Allergen Reactions   Misc. Sulfonamide Containing Compounds Other (See Comments)   Ace Inhibitors Swelling   Sulfa Antibiotics Swelling

## 2024-01-06 NOTE — Telephone Encounter (Signed)
 Copied from CRM [id number]. Topic: General - Other >> Jan 05, 2024  4:44 PM [name] wrote: Reason for CRM: patient wife calling to say patient is coughing up green mucus and he is dealing with copd, please patient at [phone]

## 2024-01-24 ENCOUNTER — Ambulatory Visit (INDEPENDENT_AMBULATORY_CARE_PROVIDER_SITE_OTHER): Admitting: Pulmonary Disease

## 2024-01-24 ENCOUNTER — Encounter: Payer: Self-pay | Admitting: Pulmonary Disease

## 2024-01-24 VITALS — BP 125/68 | HR 61 | Ht 73.0 in | Wt 234.2 lb

## 2024-01-24 DIAGNOSIS — G4733 Obstructive sleep apnea (adult) (pediatric): Secondary | ICD-10-CM

## 2024-01-24 DIAGNOSIS — R918 Other nonspecific abnormal finding of lung field: Secondary | ICD-10-CM

## 2024-01-24 DIAGNOSIS — R0602 Shortness of breath: Secondary | ICD-10-CM

## 2024-01-24 DIAGNOSIS — J449 Chronic obstructive pulmonary disease, unspecified: Secondary | ICD-10-CM | POA: Diagnosis not present

## 2024-01-24 MED ORDER — AZITHROMYCIN 250 MG PO TABS: 250.0000 mg | ORAL_TABLET | ORAL | 2 refills | Status: DC

## 2024-01-24 MED ORDER — SPIRIVA RESPIMAT 2.5 MCG/ACT IN AERS: 2.0000 | INHALATION_SPRAY | Freq: Every day | RESPIRATORY_TRACT | 5 refills | Status: DC

## 2024-01-24 NOTE — Patient Instructions (Signed)
 I will see you in about 3 months  Continue with your BiPAP  Continue Spiriva   Discontinue Stiolto  Continue Wixela  Added azithromycin  to be used 3 days a week  Call us  with significant concerns

## 2024-01-24 NOTE — Progress Notes (Signed)
 [name]    [id number]    09/01/1950  Primary Care Physician:Dean, Tim Orchard, MD  Referring Physician: Ferrell Hu, MD [address] [name],  [name] [address]  Chief complaint:   Patient with a history of narcolepsy, obstructive sleep apnea Compliant with BiPAP, compliant with stimulant medications In for follow-up today Concerned about multiple exacerbations recently  HPI:  Breathing feels relatively okay today  Concerned about multiple use of prednisone  recently Recently treated with a course of antibiotics and steroids  Tries to stay active  Still does have a lot of fatigue Compliant with modafinil  twice daily  Compliant with his inhalers  Denies chronic cough or wheezing at present Occasional chest discomfort  Diagnosed with narcolepsy many years ago - On modafinil   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   albuterol  (VENTOLIN  HFA) 108 (90 Base) MCG/ACT inhaler Inhale 2 puffs into the lungs every 6 (six) hours as needed for wheezing or shortness of breath.   amLODipine (NORVASC) 10 MG tablet Take 10 mg by mouth daily.   aspirin  EC 81 MG tablet Take 81 mg by mouth daily. Swallow whole.   azithromycin  (ZITHROMAX ) 250 MG tablet Take 1 tablet (250 mg total) by mouth daily.   b complex vitamins capsule Take 1 capsule by mouth daily.   buPROPion (WELLBUTRIN SR) 150 MG 12 hr tablet Take 150 mg by mouth daily.   carbamide  peroxide (DEBROX) 6.5 % OTIC solution Place 5 drops into both ears as needed (ear wax).   Cholecalciferol (VITAMIN D3) 1000 UNITS CAPS Take 1,000 Units by mouth every morning.   doxycycline  (VIBRA -TABS) 100 MG tablet Take 1 tablet (100 mg total) by mouth 2 (two) times daily.   empagliflozin  (JARDIANCE ) 25 MG TABS tablet Take 25 mg by mouth daily. Take 1/2 tablet every morning   FERGON 240 (27 Fe) MG tablet Take 240 mg by mouth daily.   fexofenadine (ALLEGRA) 180 MG tablet Take 180 mg by mouth daily.   fluticasone  (FLONASE ) 50 MCG/ACT nasal spray Place 1 spray into both nostrils in the morning and at bedtime.   fluticasone -salmeterol (WIXELA INHUB) 250-50 MCG/ACT AEPB Inhale 1 puff into the lungs in the morning and at bedtime.   gabapentin  (NEURONTIN ) 400 MG capsule Take 400 mg by mouth 3 (three) times daily.   hydrALAZINE (APRESOLINE) 50 MG tablet Take 50 mg by mouth 3 (three) times daily.   hydrOXYzine (ATARAX/VISTARIL) 10 MG tablet Take 20 mg by mouth at bedtime.   losartan (COZAAR) 100 MG tablet Take 100 mg by mouth every evening.   melatonin 3 MG TABS tablet Take 3 mg by mouth at bedtime.   metFORMIN  (GLUCOPHAGE ) 500 MG tablet Take 500 mg by mouth daily with breakfast.   modafinil  (PROVIGIL ) 100 MG tablet Take 200 mg (2 tablets) in the morning and 100 mg (one tablet) at noon as directed   NON FORMULARY BIPAP at bedtime   omeprazole  (PRILOSEC  OTC) 20 MG  tablet Take 1 tablet (20 mg total) by mouth daily.   PARoxetine  (PAXIL ) 40 MG tablet Take 40 mg by mouth at bedtime.   Polyethyl Glycol-Propyl Glycol (SYSTANE OP) Apply to eye. par4n   pravastatin (PRAVACHOL) 40 MG tablet Take 40 mg by mouth at bedtime.   Probiotic Product (PROBIOTIC DAILY PO) Take 1 capsule by mouth daily.   QUEtiapine (SEROQUEL) 25 MG tablet Take 50 mg by mouth at bedtime.   spironolactone (ALDACTONE) 25 MG tablet Take 50 mg by mouth daily.    tamsulosin (FLOMAX) 0.4 MG CAPS capsule Take 0.4 mg by mouth every morning.    Tiotropium Bromide Monohydrate  (SPIRIVA  RESPIMAT) 2.5 MCG/ACT AERS Inhale 2 puffs into the lungs daily.   Tiotropium Bromide-Olodaterol (STIOLTO RESPIMAT ) 2.5-2.5 MCG/ACT AERS Inhale 2 puffs into the lungs daily. 2 puffs daily   triamcinolone ointment (KENALOG) 0.1 % Apply 1 application  topically daily as needed (rash).   Turmeric 400 MG CAPS Take 400 mg by mouth daily.   vitamin E  400 UNIT capsule Take 400 Units by mouth daily.   predniSONE  (DELTASONE ) 20 MG tablet 2 tabs daily x 5 days (Patient not taking: Reported on 01/24/2024)   No facility-administered encounter medications on file as of 01/24/2024.    Allergies as of 01/24/2024 - Review Complete 01/24/2024  Allergen Reaction Noted   Misc. sulfonamide containing compounds Other (See Comments) 07/23/2022   Ace inhibitors Swelling 08/27/2011   Sulfa antibiotics Swelling 08/27/2011    Past Medical History:  Diagnosis Date   Abnormal CT scan of lung 07/23/2021   Abnormality of gait    Allergic rhinitis due to pollen 11/25/2010   Bilateral primary osteoarthritis of hip 12/12/2019   Carpal tunnel syndrome on right    Contact with and (suspected) exposure to asbestos 07/23/2021   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   Hyperlipidemia    Hypothyroidism    Klippel-Feil sequence    Learning disability, unspecified 1997   Reading/reading comprehension   Left inguinal hernia    Low back pain 12/12/2019   Lumbar spondylosis    Major depressive disorder    Mild cognitive impairment 10/18/2022    Nonspecific abnormal electrocardiogram (ECG) (EKG)    Obstructive sleep apnea 12/15/2015   Osteoarthritis    Osteoporosis    Pain in joints of right hand    Peripheral neuropathy    Peripheral venous insufficiency    Pituitary abnormality    pituitary edema; takes bromocriptine    PTSD (post-traumatic stress disorder) 11/25/2010   Spinal stenosis of lumbar region 09/14/2018   Type 2 diabetes mellitus without complications 11/25/2010   Vitamin D deficiency    Weakness of left leg    Wears glasses     Past Surgical History:  Procedure Laterality Date   CARDIAC CATHETERIZATION     CARPAL TUNNEL RELEASE Right 10/12/2022   Procedure: Right CARPAL TUNNEL RELEASE;  Surgeon: Audie Bleacher, MD;  Location: [facility] OR;  Service: Neurosurgery;  Laterality: Right;  RM 20   CERVICAL DISC SURGERY     x 2  COLONOSCOPY     HIP ARTHROPLASTY     right   INGUINAL HERNIA REPAIR Left 09/06/2019   Procedure: LAPAROSCOPIC LEFT INGUINAL HERNIA WITH MESH;  Surgeon: Aldean Hummingbird, MD;  Location[facility];  Service: General;  Laterality: Left;   JOINT REPLACEMENT     KNEE ARTHROPLASTY     bilateral   LUMBAR LAMINECTOMY/DECOMPRESSION MICRODISCECTOMY  08/31/2011   Procedure: LUMBAR LAMINECTOMY/DECOMPRESSION MICRODISCECTOMY;  Surgeon: Shary Deems, MD;  Location: [facility];  Service: Neurosurgery;  Laterality: N/A;  LumbarThree to Lumbar Five Laminectomy, possible Lumbar Four-Five Diskectomy   SHOULDER SURGERY     left   UPPER GI ENDOSCOPY      Family History  Problem Relation Age of Onset   Diabetes Mother    Alzheimer's disease Mother    Depression Father    ALS Father    ALS Brother    Hypertension Brother    Hypertension Brother    Cancer Brother     Social History   Socioeconomic History   Marital status: Married    Spouse name: Not on file   Number of children: 2   Years of education: 14   Highest education level: Associate degree: occupational, Scientist, product/process development, or  vocational program  Occupational History   Occupation: Retired    Comment: Nurse, learning disability  Tobacco Use   Smoking status: Never   Smokeless tobacco: Never  Vaping Use   Vaping status: Never Used  Substance and Sexual Activity   Alcohol  use: No   Drug use: No   Sexual activity: Not Currently  Other Topics Concern   Not on file  Social History Narrative   Left handed   Drinks caffeine, soda   Lives with wife   retired   Chief Executive Officer [employer] investment banker Strain: Not on file  Food Insecurity: Not on file  Transportation Needs: Not on file  Physical Activity: Not on file  Stress: Not on file  Social Connections: Unknown (01/07/2022)   Received from [facility], [facility]   Social Network    Social Network: Not on file  Intimate Partner Violence: Unknown (01/07/2022)   Received from [facility], [facility]   HITS    Physically Hurt: Not on file    Insult or Talk Down To: Not on file    Threaten Physical Harm: Not on file    Scream or Curse: Not on file   Review of Systems  Constitutional:  Negative for activity change and appetite change.  HENT: Negative.    Eyes: Negative.   Respiratory:  Positive for apnea, cough and shortness of breath. Negative for choking.   Cardiovascular: Negative.   Endocrine: Negative.   Genitourinary: Negative.   Allergic/Immunologic: Negative.   Neurological: Negative.   Hematological: Negative.   Psychiatric/Behavioral:  Positive for sleep disturbance.     Vitals:   01/24/24 1555  BP: 125/68  Pulse: 61  SpO2: 95%     Physical Exam Constitutional:      General: He is not in acute distress.    Appearance: He is well-developed. He is not diaphoretic.  HENT:     Head: Normocephalic and atraumatic.     Mouth/Throat:     Mouth: Mucous membranes are moist.  Eyes:     General: No scleral icterus.    Pupils: Pupils are equal, round, and reactive to light.  Neck:     Thyroid: No thyromegaly.     Trachea: No tracheal  deviation.  Cardiovascular:     Rate and Rhythm: Normal rate and regular rhythm.  Pulmonary:     Effort: Pulmonary effort is normal. No respiratory distress.     Breath sounds: Normal breath sounds. No stridor. No wheezing or rhonchi.  Musculoskeletal:        General: No deformity. Normal range of motion.     Cervical back: No rigidity or tenderness.  Skin:    General: Skin is warm and dry.     Findings: No erythema.  Neurological:     Mental Status: He is alert.     Coordination: Coordination normal.  Psychiatric:        Behavior: Behavior normal.    Data Reviewed: Sleep study from November 2007 noted Compliance data was not reviewed today  most recent chest x-ray 12/11/2021-reviewed by myself with no acute infiltrative process  His most recent CT scan was reviewed with him in the office today compared to 1 performed 08/14/2021  Most recent CT scan of the chest 11/30/2023 with rounded atelectasis at left lower lobe  I do not see a PFT in the system -Stated he had had one done in [place]  Assessment:  .  Obstructive sleep apnea appears well treated - Encouraged to continue BiPAP on a nightly basis  .  COPD with recent exacerbation - Continue current inhalers - Medication list was reviewed today - He will continue with Wixela and Spiriva  - Addition of azithromycin  to 50 mg, 3 times a week  .  Narcolepsy - Continue modafinil   .  History of PTSD  .  Rounded atelectasis left base - This has remained stable on repeat CTs  Plan/Recommendations:  Continue BiPAP nightly  Azithromycin  250 mg, 3 times weekly  Continue modafinil   Encouraged to call with significant concerns  Follow-up in about 3 months  Graded activities as tolerated  Myer Artis MD [facility] 01/24/2024, 4:26 PM  CC: Tim Hu, MD

## 2024-01-25 ENCOUNTER — Telehealth: Payer: Self-pay

## 2024-01-25 ENCOUNTER — Other Ambulatory Visit: Payer: Self-pay | Admitting: Pulmonary Disease

## 2024-01-25 MED ORDER — AZITHROMYCIN 250 MG PO TABS: 250.0000 mg | ORAL_TABLET | ORAL | 5 refills | Status: DC

## 2024-01-25 NOTE — Telephone Encounter (Signed)
 Copied from CRM [id number]. Topic: Clinical - Prescription Issue >> Jan 25, 2024 11:48 AM Juliana Ocean wrote: Reason for CRM: Johann Muta w/ [facility] calling to clarify the Rx sent today for azithromycin  (ZITHROMAX  Z-PAK) 250 MG tablet.  Take 1 tablet (250 mg total) by mouth 3 (three) times a week. 2 tablets on day 1 and then 1 daily for 4 days - Oral You can resend or call her directly at [phone]  ext [phone]  Dr.Olalere has sent in the dosage of zithromax  .  Nothing else further needed.

## 2024-01-31 ENCOUNTER — Encounter: Payer: Self-pay | Admitting: Internal Medicine

## 2024-02-14 DIAGNOSIS — G4733 Obstructive sleep apnea (adult) (pediatric): Secondary | ICD-10-CM | POA: Diagnosis not present

## 2024-02-14 DIAGNOSIS — M50121 Cervical disc disorder at C4-C5 level with radiculopathy: Secondary | ICD-10-CM | POA: Diagnosis not present

## 2024-02-14 DIAGNOSIS — F0671 Mild neurocognitive disorder due to known physiological condition with behavioral disturbance: Secondary | ICD-10-CM | POA: Diagnosis not present

## 2024-02-14 DIAGNOSIS — I119 Hypertensive heart disease without heart failure: Secondary | ICD-10-CM | POA: Diagnosis not present

## 2024-02-14 DIAGNOSIS — I872 Venous insufficiency (chronic) (peripheral): Secondary | ICD-10-CM | POA: Diagnosis not present

## 2024-02-14 DIAGNOSIS — I2510 Atherosclerotic heart disease of native coronary artery without angina pectoris: Secondary | ICD-10-CM | POA: Diagnosis not present

## 2024-02-14 DIAGNOSIS — E1122 Type 2 diabetes mellitus with diabetic chronic kidney disease: Secondary | ICD-10-CM | POA: Diagnosis not present

## 2024-02-14 DIAGNOSIS — J441 Chronic obstructive pulmonary disease with (acute) exacerbation: Secondary | ICD-10-CM | POA: Diagnosis not present

## 2024-02-14 DIAGNOSIS — F5221 Male erectile disorder: Secondary | ICD-10-CM | POA: Diagnosis not present

## 2024-03-07 ENCOUNTER — Other Ambulatory Visit (INDEPENDENT_AMBULATORY_CARE_PROVIDER_SITE_OTHER)

## 2024-03-07 ENCOUNTER — Ambulatory Visit (INDEPENDENT_AMBULATORY_CARE_PROVIDER_SITE_OTHER): Admitting: Orthopaedic Surgery

## 2024-03-07 VITALS — Ht 73.5 in | Wt 237.4 lb

## 2024-03-07 DIAGNOSIS — E1122 Type 2 diabetes mellitus with diabetic chronic kidney disease: Secondary | ICD-10-CM | POA: Diagnosis not present

## 2024-03-07 DIAGNOSIS — R8290 Unspecified abnormal findings in urine: Secondary | ICD-10-CM | POA: Diagnosis not present

## 2024-03-07 DIAGNOSIS — M25552 Pain in left hip: Secondary | ICD-10-CM | POA: Diagnosis not present

## 2024-03-07 DIAGNOSIS — I119 Hypertensive heart disease without heart failure: Secondary | ICD-10-CM | POA: Diagnosis not present

## 2024-03-07 DIAGNOSIS — M1612 Unilateral primary osteoarthritis, left hip: Secondary | ICD-10-CM | POA: Diagnosis not present

## 2024-03-07 DIAGNOSIS — E782 Mixed hyperlipidemia: Secondary | ICD-10-CM | POA: Diagnosis not present

## 2024-03-07 DIAGNOSIS — E039 Hypothyroidism, unspecified: Secondary | ICD-10-CM | POA: Diagnosis not present

## 2024-03-07 DIAGNOSIS — D509 Iron deficiency anemia, unspecified: Secondary | ICD-10-CM | POA: Diagnosis not present

## 2024-03-07 NOTE — Progress Notes (Signed)
 The patient is a 73 year old veteran who comes in today to discuss hip replacement surgery for his left hip.  He has known bone-on-bone wear of the left hip and has dealt with daily hip pain for about a year now.  It is detrimentally affecting his mobility, his quality of life and his actives daily living.  He is an Investment banker, operational.  He is diabetic but reports his last hemoglobin A1c was 6.  He does have asthma as well.  I was able to review all of his medical history and medications within epic.  He does have a history of a right hip replacement done many years ago through a posterior approach.  He has both his knees replaced as well.  He does ambulate using a cane.  His wife is with him today as well.  Examination of his right hip shows it moves smoothly and fluidly.  The left hip which is the painful hip has limitations with internal and external rotation and pain in the groin as well.  Standing AP pelvis and lateral of the left hip shows bone-on-bone wear of the left hip.  There is loss of joint space with sclerotic changes and osteophytes on the left hip.  We had a long and thorough discussion about hip replacement surgery.  I discussed direct anterior hip replacement surgery.  He has a posterior hip replacement on his other side that is done well.  We discussed what to expect from an intraoperative and postoperative standpoint.  We discussed the risks and benefits of the surgery in detail.  All questions and concerns were addressed and answered.  He would like this to be scheduled at [facility] sometime in the near future.  We will be in touch with scheduling surgery.

## 2024-03-08 NOTE — Telephone Encounter (Signed)
 [place]

## 2024-03-09 ENCOUNTER — Encounter: Payer: Self-pay | Admitting: Physician Assistant

## 2024-03-09 ENCOUNTER — Ambulatory Visit (INDEPENDENT_AMBULATORY_CARE_PROVIDER_SITE_OTHER): Admitting: Physician Assistant

## 2024-03-09 VITALS — BP 122/53 | HR 72 | Resp 20 | Ht 73.5 in | Wt 237.0 lb

## 2024-03-09 DIAGNOSIS — G3184 Mild cognitive impairment, so stated: Secondary | ICD-10-CM

## 2024-03-09 DIAGNOSIS — F819 Developmental disorder of scholastic skills, unspecified: Secondary | ICD-10-CM | POA: Diagnosis not present

## 2024-03-09 DIAGNOSIS — E1142 Type 2 diabetes mellitus with diabetic polyneuropathy: Secondary | ICD-10-CM | POA: Diagnosis not present

## 2024-03-09 NOTE — Patient Instructions (Signed)
 It was a pleasure to see you today at our office.   Recommendations:  Return in 1 year,July 24 at 11:30     Whom to call:  Memory  decline, memory medications: Call our office [phone]   For psychiatric meds, mood meds: Please have your primary care physician manage these medications.      For assessment of decision of mental capacity and competency:  Call Dr. Rosaline Nine, geriatric psychiatrist at [phone]  For guidance in geriatric dementia issues please call [facility] [phone]     If you have any severe symptoms of a stroke, or other severe issues such as confusion,severe chills or fever, etc call 911 or go to the ER as you may need to be evaluated further       RECOMMENDATIONS FOR ALL PATIENTS WITH MEMORY PROBLEMS: 1. Continue to exercise (Recommend 30 minutes of walking everyday, or 3 hours every week) 2. Increase social interactions - continue going to [place] and enjoy social gatherings with friends and family 3. Eat healthy, avoid fried foods and eat more fruits and vegetables 4. Maintain adequate blood pressure, blood sugar, and blood cholesterol level. Reducing the risk of stroke and cardiovascular disease also helps promoting better memory. 5. Avoid stressful situations. Live a simple life and avoid aggravations. Organize your time and prepare for the next day in anticipation. 6. Sleep well, avoid any interruptions of sleep and avoid any distractions in the bedroom that may interfere with adequate sleep quality 7. Avoid sugar, avoid sweets as there is a strong link between excessive sugar intake, diabetes, and cognitive impairment We discussed the Mediterranean diet, which has been shown to help patients reduce the risk of progressive memory disorders and reduces cardiovascular risk. This includes eating fish, eat fruits and green leafy vegetables, nuts like almonds and hazelnuts, walnuts, and also use olive oil. Avoid fast foods and fried  foods as much as possible. Avoid sweets and sugar as sugar use has been linked to worsening of memory function.  There is always a concern of gradual progression of memory problems. If this is the case, then we may need to adjust level of care according to patient needs. Support, both to the patient and caregiver, should then be put into place.    The [employer] is here all day, every day for people facing Alzheimer's disease through our free 24/7 Helpline: [phone]. The Helpline provides reliable information and support to all those who need assistance, such as individuals living with memory loss, Alzheimer's or other dementia, caregivers, health care professionals and the public.  Our highly trained and knowledgeable staff can help you with: Understanding memory loss, dementia and Alzheimer's  Medications and other treatment options  General information about aging and brain health  Skills to provide quality care and to find the best care from professionals  Legal, financial and living-arrangement decisions Our Helpline also features: Confidential care consultation provided by master's level clinicians who can help with decision-making support, crisis assistance and education on issues families face every day  Help in a caller's preferred language using our translation service that features more than 200 languages and dialects  Referrals to local community programs, services and ongoing support     FALL PRECAUTIONS: Be cautious when walking. Scan the area for obstacles that may increase the risk of trips and falls. When getting up in the mornings, sit up at the edge of the bed for a few minutes before getting out of bed. Consider elevating the  bed at the head end to avoid drop of blood pressure when getting up. Walk always in a well-lit room (use night lights in the walls). Avoid area rugs or power cords from appliances in the middle of the walkways. Use a walker or a cane if  necessary and consider physical therapy for balance exercise. Get your eyesight checked regularly.  FINANCIAL OVERSIGHT: Supervision, especially oversight when making financial decisions or transactions is also recommended.  HOME SAFETY: Consider the safety of the kitchen when operating appliances like stoves, microwave oven, and blender. Consider having supervision and share cooking responsibilities until no longer able to participate in those. Accidents with firearms and other hazards in the house should be identified and addressed as well.   ABILITY TO BE LEFT ALONE: If patient is unable to contact 911 operator, consider using LifeLine, or when the need is there, arrange for someone to stay with patients. Smoking is a fire hazard, consider supervision or cessation. Risk of wandering should be assessed by caregiver and if detected at any point, supervision and safe proof recommendations should be instituted.  MEDICATION SUPERVISION: Inability to self-administer medication needs to be constantly addressed. Implement a mechanism to ensure safe administration of the medications.   DRIVING: Regarding driving, in patients with progressive memory problems, driving will be impaired. We advise to have someone else do the driving if trouble finding directions or if minor accidents are reported. Independent driving assessment is available to determine safety of driving.   If you are interested in the driving assessment, you can contact the following:  [employer] in [place] [phone]  [facility] [phone]  [facility] [phone] or [phone]      Mediterranean Diet A Mediterranean diet refers to food and lifestyle choices that are based on the traditions of countries located on the [employer]. This way of eating has been shown to help prevent certain conditions and improve outcomes for people who have  chronic diseases, like kidney disease and heart disease. What are tips for following this plan? Lifestyle  Cook and eat meals together with your family, when possible. Drink enough fluid to keep your urine clear or pale yellow. Be physically active every day. This includes: Aerobic exercise like running or swimming. Leisure activities like gardening, walking, or housework. Get 7-8 hours of sleep each night. If recommended by your health care provider, drink red wine in moderation. This means 1 glass a day for nonpregnant women and 2 glasses a day for men. A glass of wine equals 5 oz (150 mL). Reading food labels  Check the serving size of packaged foods. For foods such as rice and pasta, the serving size refers to the amount of cooked product, not dry. Check the total fat in packaged foods. Avoid foods that have saturated fat or trans fats. Check the ingredients list for added sugars, such as corn syrup. Shopping  At the grocery store, buy most of your food from the areas near the walls of the store. This includes: Fresh fruits and vegetables (produce). Grains, beans, nuts, and seeds. Some of these may be available in unpackaged forms or large amounts (in bulk). Fresh seafood. Poultry and eggs. Low-fat dairy products. Buy whole ingredients instead of prepackaged foods. Buy fresh fruits and vegetables in-season from local farmers markets. Buy frozen fruits and vegetables in resealable bags. If you do not have access to quality fresh seafood, buy precooked frozen shrimp or canned fish, such as tuna, salmon,  or sardines. Buy small amounts of raw or cooked vegetables, salads, or olives from the deli or salad bar at your store. Stock your pantry so you always have certain foods on hand, such as olive oil, canned tuna, canned tomatoes, rice, pasta, and beans. Cooking  Cook foods with extra-virgin olive oil instead of using butter or other vegetable oils. Have meat as a side dish, and have  vegetables or grains as your main dish. This means having meat in small portions or adding small amounts of meat to foods like pasta or stew. Use beans or vegetables instead of meat in common dishes like chili or lasagna. Experiment with different cooking methods. Try roasting or broiling vegetables instead of steaming or sauteing them. Add frozen vegetables to soups, stews, pasta, or rice. Add nuts or seeds for added healthy fat at each meal. You can add these to yogurt, salads, or vegetable dishes. Marinate fish or vegetables using olive oil, lemon juice, garlic, and fresh herbs. Meal planning  Plan to eat 1 vegetarian meal one day each week. Try to work up to 2 vegetarian meals, if possible. Eat seafood 2 or more times a week. Have healthy snacks readily available, such as: Vegetable sticks with hummus. Greek yogurt. Fruit and nut trail mix. Eat balanced meals throughout the week. This includes: Fruit: 2-3 servings a day Vegetables: 4-5 servings a day Low-fat dairy: 2 servings a day Fish, poultry, or lean meat: 1 serving a day Beans and legumes: 2 or more servings a week Nuts and seeds: 1-2 servings a day Whole grains: 6-8 servings a day Extra-virgin olive oil: 3-4 servings a day Limit red meat and sweets to only a few servings a month What are my food choices? Mediterranean diet Recommended Grains: Whole-grain pasta. Brown rice. Bulgar wheat. Polenta. Couscous. Whole-wheat bread. [place] Madeira. Vegetables: Artichokes. Beets. Broccoli. Cabbage. Carrots. Eggplant. Green beans. Chard. Kale. Spinach. Onions. Leeks. Peas. Squash. Tomatoes. Peppers. Radishes. Fruits: Apples. Apricots. Avocado. Berries. Bananas. Cherries. Dates. Figs. Grapes. Lemons. Melon. Oranges. Peaches. Plums. Pomegranate. Meats and other protein foods: Beans. Almonds. Sunflower seeds. Pine nuts. Peanuts. Cod. Salmon. Scallops. Shrimp. Tuna. Tilapia. Clams. Oysters. Eggs. Dairy: Low-fat milk. Cheese. Greek  yogurt. Beverages: Water. Red wine. Herbal tea. Fats and oils: Extra virgin olive oil. Avocado oil. Grape seed oil. Sweets and desserts: [place] yogurt with honey. Baked apples. Poached pears. Trail mix. Seasoning and other foods: Basil. Cilantro. Coriander. Cumin. Mint. Parsley. Sage. Rosemary. Tarragon. Garlic. Oregano. Thyme. Pepper. Balsalmic vinegar. Tahini. Hummus. Tomato sauce. Olives. Mushrooms. Limit these Grains: Prepackaged pasta or rice dishes. Prepackaged cereal with added sugar. Vegetables: Deep fried potatoes (french fries). Fruits: Fruit canned in syrup. Meats and other protein foods: Beef. Pork. Lamb. Poultry with skin. Hot dogs. [place]. Dairy: Ice cream. Sour cream. Whole milk. Beverages: Juice. Sugar-sweetened soft drinks. Beer. Liquor and spirits. Fats and oils: Butter. Canola oil. Vegetable oil. Beef fat (tallow). Lard. Sweets and desserts: Cookies. Cakes. Pies. Candy. Seasoning and other foods: Mayonnaise. Premade sauces and marinades. The items listed may not be a complete list. Talk with your dietitian about what dietary choices are right for you. Summary The Mediterranean diet includes both food and lifestyle choices. Eat a variety of fresh fruits and vegetables, beans, nuts, seeds, and whole grains. Limit the amount of red meat and sweets that you eat. Talk with your health care provider about whether it is safe for you to drink red wine in moderation. This means 1 glass a day for nonpregnant women and 2  glasses a day for men. A glass of wine equals 5 oz (150 mL). This information is not intended to replace advice given to you by your health care provider. Make sure you discuss any questions you have with your health care provider. Document Released: 03/25/2016 Document Revised: 04/27/2016 Document Reviewed: 03/25/2016 Elsevier Interactive Patient Education  2017 [employer].   Labs today [address]  [facility] for [facility] [phone]

## 2024-03-09 NOTE — Progress Notes (Signed)
 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 Patient is not on antidementia medication, not indicated at this time. Memory is stable, MMSE 29/30. Mood is stable.     Recommendations:   Follow up in 1 year There is no indication for any dementia medication at this time Continue management of sleep disorder, COPD, OSA BiPAP per pulmonology  Recommend good control of cardiovascular risk factors Continue to control mood as per psychiatry Monitor driving    Subjective:   This patient is accompanied in the office by his wife  who supplements the history. Previous records as well as any outside records available were reviewed prior to todays visit.   Patient was last seen on 04/12/2023, last MoCA 24/30 .    Any changes in memory since last visit? About the same.  May forget new information, recent conversations.  LTM is good. repeats oneself?  Endorsed Disoriented when walking into a room?  Patient denies    Misplacing objects?  He is more disorganized  Wandering behavior?   Denies. Any personality changes since last visit? Denies.   Any worsening depression?: I am even right now.   Trying to balance depression and anxiety   Sees psychiatry (last seen 6/10) and psychotherapy Hallucinations or paranoia?  Denies.   Seizures?   Denies.    Any sleep changes? Sleeps well  Does not sleep very well.  He reports  vivid dreams, denies REM behavior or sleepwalking   Sleep apnea?   Endorsed, uses BiPAP   Any hygiene concerns?   Denies.   Independent of bathing and dressing?  Endorsed  Does the patient needs help with medications? Patient is in charge, wife helps   Who is in charge of the finances?  Wife is in charge     Any changes in appetite?  denies     Patient have trouble swallowing?  Denies.    Any kitchen accidents such as leaving the stove on?   Denies.   Any headaches?    Denies.   Vision changes? Denies. L cataract surgery next month Chronic pain? Lower back and L hip pain controlled with gabapentin  Ambulates with difficulty?  He has bone-on-bone left hip pain due to arthritis, affecting his mobility, he is for hip replacement soon.  He uses a cane for stability   Recent falls or head injuries? April 2025 he fell after hip giving out, no head injury, no LOC   Unilateral weakness, numbness or tingling?  Denies.   Any tremors?  Denies.   Any anosmia?    Denies.   Any incontinence of urine?  Denies.   Any bowel dysfunction?  Denies.      Patient lives with wife.  Does the patient drive? Yes, denies any issues     Initial evaluation 08/20/2022 How long did patient have memory difficulties?  He has been diagnosed with MCI likely due to Alzheimer's Disease  ( per Neuropsych evaluation at least dating back to 2015)- but his wife reports that this may be longer about 2013 when  he began forgetting recent conversations or events, asking the same question and repeating a story.  Wife asks him to repeat things, he may be experiencing disorganized thoughts. He uses [place] to communicate. A caregiver has to help him get organized with things around the house more often-she says. To what he answers I have been always been disorganized . 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   Disoriented when walking into a room?  Patient denies. He has a man cave where he is free to do anything he wants to do without disorganizing the whole house.   Leaving objects in unusual places?  Endorsed by his wife.   Wandering behavior? denies   Any personality changes since last visit?  He is very stressed out when she overprotects him.  She had surgery and was stressful for him; he was lashing out to anybody, because everyone was trying to tell me what to do.  He has a  psychiatrist, and a psychotherapist whom he sees every 3 months  Any worsening depression?:  Patient is more depressed recently after his brother was diagnosed with Dene Kim disease and also he is experiencing stress due to wife having a diagnosis of multiple myeloma and Lupus    Hallucinations or paranoia? For at least 50 years.  When I am very tired, I see cars, bicycle,  and money.  I know they are not there-he says  Seizures?   denies    Any sleep changes?  Sleeps w BiPAP and takes meds for mental health and sleep hard about 4 h a night Nice vivid dreams, Denies REM behavior or sleepwalking. Takes melatonin and Seroquel.  Sleep apnea? BiPAP , compliant Any hygiene concerns?  denies   Independent of bathing and dressing?  Endorsed  Does the patient need help with medications? Wife is in charge   Who is in charge of the finances?  Wife is in charge     Any changes in appetite?   denies     Patient have trouble swallowing?  denies   Does the patient cook? Wife since I got booted out because of the COPD and the neuropathy and I drop heavy things-he says   Any headaches?  denies   Chronic back pain? Endorsed, sees Dr. Lindalee, NS, and attends pain clinic. Ambulates with difficulty?  Uses a cane to ambulate and ankle braces for stability  Recent falls or head injuries?  denies     Unilateral weakness, numbness or tingling?He has known neuropathy, scheduled for [facility] 09/08/22 Dr Lindalee.  Any  tremors?On the R hand he has mil tremors, chronic.    Any anosmia?  denies   Any incontinence of urine?  denies   Any bowel dysfunction?    denies      Patient lives  with wife History of heavy alcohol  intake? denies   History of heavy tobacco use?   denies   Family history of dementia?     Mother had AD, GrandUncle (paternal) AD  Dose patient drive? No issues'  he does not like traffic, he does not like to go outside of what he knows, sense of direction sometimes is an issue     Neuropsych evaluation 10/27/2022 Briefly, results suggested an isolated impairment surrounding phonemic fluency. Further performance variability was exhibited across processing speed and executive functioning. While he did have trouble differentiating between words across  a list-based recognition task, performances across all other aspects of memory testing were appropriate. The etiology for ongoing dysfunction is multifactorial in nature. As outlined in old records provided by his wife, concerns were expressed surrounding an undiagnosed learning disability as far back as the mid to late 1990s. Testing later revealed this to be true, describing an unspecified learning disability favoring trouble with reading and reading comprehension. Previous records also suggest concerns surrounding severe depressive experiences dating back to 1979, as well as post-traumatic stress disorder (PTSD) formally diagnosed in 2016 (but likely present for a far longer duration of time). Taken together, the combination of a reading/verbal based learning disability and severe psychiatric distress can certainly explain primary patterns of variability across the current neuropsychological evaluation. Difficulties could additionally be exacerbated by his numerous medical ailments (e.g., hypertension, coronary artery disease, type II diabetes, hypothyroidism, hyperlipidemia), chronic pain, and polypharmacy. While a prior [facility]-based neurologist diagnosed  him with memory loss and concerns for Alzheimer's disease back in 2015, this would appear an erroneous diagnosis.     Past Medical History:  Diagnosis Date   Abnormal CT scan of lung 07/23/2021   Abnormality of gait    Allergic rhinitis due to pollen 11/25/2010   Bilateral primary osteoarthritis of hip 12/12/2019   Carpal tunnel syndrome on right    Contact with and (suspected) exposure to asbestos 07/23/2021   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   Hyperlipidemia    Hypothyroidism    Klippel-Feil sequence    Learning disability, unspecified 1997   Reading/reading comprehension   Left inguinal hernia    Low back pain 12/12/2019   Lumbar spondylosis    Major depressive disorder    Mild cognitive impairment 10/18/2022   Nonspecific abnormal electrocardiogram (ECG) (EKG)    Obstructive sleep apnea 12/15/2015   Osteoarthritis    Osteoporosis    Pain in joints of right hand    Peripheral neuropathy    Peripheral venous insufficiency    Pituitary abnormality    pituitary edema; takes bromocriptine    PTSD (post-traumatic stress disorder) 11/25/2010   Spinal stenosis of lumbar region 09/14/2018   Type 2 diabetes mellitus without complications 11/25/2010   Vitamin D deficiency    Weakness of left leg    Wears glasses      Past Surgical History:  Procedure Laterality Date   CARDIAC CATHETERIZATION     CARPAL TUNNEL RELEASE Right 10/12/2022   Procedure: Right CARPAL TUNNEL RELEASE;  Surgeon: Gillie Duncans, MD;  Location: [facility] OR;  Service:  Neurosurgery;  Laterality: Right;  RM 20   CERVICAL DISC SURGERY     x 2   COLONOSCOPY     HIP ARTHROPLASTY     right   INGUINAL HERNIA REPAIR Left 09/06/2019   Procedure: LAPAROSCOPIC LEFT INGUINAL HERNIA WITH MESH;  Surgeon: Tanda Locus, MD;  Location[facility];  Service: General;  Laterality: Left;   JOINT REPLACEMENT     KNEE ARTHROPLASTY     bilateral   LUMBAR LAMINECTOMY/DECOMPRESSION MICRODISCECTOMY  08/31/2011   Procedure:  LUMBAR LAMINECTOMY/DECOMPRESSION MICRODISCECTOMY;  Surgeon: Lynwood JONELLE Mill, MD;  Location: [facility];  Service: Neurosurgery;  Laterality: N/A;  LumbarThree to Lumbar Five Laminectomy, possible Lumbar Four-Five Diskectomy   SHOULDER SURGERY     left   UPPER GI ENDOSCOPY       PREVIOUS MEDICATIONS:   CURRENT MEDICATIONS:  Outpatient Encounter Medications as of 03/09/2024  Medication Sig   albuterol  (PROVENTIL ) (2.5 MG/3ML) 0.083% nebulizer solution Take 3 mLs (2.5 mg total) by nebulization every 4 (four) hours as needed for wheezing or shortness of breath.   albuterol  (VENTOLIN  HFA) 108 (90 Base) MCG/ACT inhaler Inhale 2 puffs into the lungs every 6 (six) hours as needed for wheezing or shortness of breath.   amLODipine (NORVASC) 10 MG tablet Take 10 mg by mouth daily.   aspirin  EC 81 MG tablet Take 81 mg by mouth daily. Swallow whole.   azithromycin  (ZITHROMAX  Z-PAK) 250 MG tablet Take 1 tablet (250 mg total) by mouth 3 (three) times a week.   b complex vitamins capsule Take 1 capsule by mouth daily.   buPROPion (WELLBUTRIN SR) 150 MG 12 hr tablet Take 150 mg by mouth daily.   carbamide peroxide (DEBROX) 6.5 % OTIC solution Place 5 drops into both ears as needed (ear wax).   Cholecalciferol (VITAMIN D3) 1000 UNITS CAPS Take 1,000 Units by mouth every morning.   empagliflozin  (JARDIANCE ) 25 MG TABS tablet Take 25 mg by mouth daily. Take 1/2 tablet every morning   FERGON 240 (27 Fe) MG tablet Take 240 mg by mouth daily.   fexofenadine  (ALLEGRA) 180 MG tablet Take 180 mg by mouth daily.   fluticasone  (FLONASE ) 50 MCG/ACT nasal spray Place 1 spray into both nostrils in the morning and at bedtime.   fluticasone -salmeterol (WIXELA INHUB) 250-50 MCG/ACT AEPB Inhale 1 puff into the lungs in the morning and at bedtime.   gabapentin  (NEURONTIN ) 400 MG capsule Take 400 mg by mouth 3 (three) times daily.   hydrALAZINE (APRESOLINE) 50 MG tablet Take 50 mg by mouth 3 (three) times daily.   hydrOXYzine (ATARAX/VISTARIL) 10 MG tablet Take 20 mg by mouth at bedtime.   losartan (COZAAR) 100 MG tablet Take 100 mg by mouth every evening.   melatonin 3 MG TABS tablet Take 3 mg by mouth at bedtime.   metFORMIN  (GLUCOPHAGE ) 500 MG tablet Take 500 mg by mouth daily with breakfast.   modafinil  (PROVIGIL ) 100 MG tablet Take 200 mg (2 tablets) in the morning and 100 mg (one tablet) at noon as directed   NON FORMULARY BIPAP at bedtime   omeprazole  (PRILOSEC  OTC) 20 MG tablet Take 1 tablet (20 mg total) by mouth daily.   PARoxetine  (PAXIL ) 40 MG tablet Take 40 mg by mouth at bedtime.   Polyethyl Glycol-Propyl Glycol (SYSTANE OP) Apply to eye. par4n   pravastatin (PRAVACHOL) 40 MG tablet Take 40 mg by mouth at bedtime.   Probiotic Product (PROBIOTIC DAILY PO) Take 1 capsule by mouth daily.   QUEtiapine (SEROQUEL) 25 MG tablet Take 50 mg by mouth at bedtime.   spironolactone (ALDACTONE) 25 MG tablet Take 50 mg by mouth daily.    tamsulosin (FLOMAX) 0.4 MG CAPS capsule Take 0.4 mg by mouth every morning.   Tiotropium Bromide Monohydrate  (SPIRIVA  RESPIMAT) 2.5 MCG/ACT AERS Inhale 2 puffs into the lungs daily.   triamcinolone ointment (KENALOG) 0.1 % Apply 1 application  topically daily as needed (rash).   Turmeric 400 MG CAPS Take 400 mg by mouth daily.  vitamin E  400 UNIT capsule Take 400 Units by mouth daily.   No facility-administered encounter medications on file as of 03/09/2024.     Objective:     PHYSICAL EXAMINATION:    VITALS:    Vitals:   03/09/24 1133  BP: (!) 122/53  Pulse: 72  Resp: 20  SpO2: 96%  Weight: 237 lb (107.5 kg)  Height: 6' 1.5 (1.867 m)    GEN:  The patient appears stated age and is in NAD. HEENT:  Normocephalic, atraumatic.   Neurological examination:  General: NAD, well-groomed, appears stated age. Orientation: The patient is alert. Oriented to person, place and not to date.  Cranial nerves: There is good facial symmetry.The speech is fluent and clear. No aphasia or dysarthria. Fund of knowledge is appropriate. Recent memory impaired and remote memory is normal.  Attention and concentration are normal.  Able to name objects and repeat phrases.  Hearing is intact to conversational tone.   Delayed recall 3/3 Sensation: Sensation is intact to light touch throughout Motor: Strength is at least antigravity x4. DTR's 2/4 in UE/LE      08/21/2022   10:00 AM  Montreal Cognitive Assessment   Visuospatial/ Executive (0/5) 4  Naming (0/3) 3  Attention: Read list of digits (0/2) 1  Attention: Read list of letters (0/1) 0  Attention: Serial 7 subtraction starting at 100 (0/3) 3  Language: Repeat phrase (0/2) 1  Language : Fluency (0/1) 0  Abstraction (0/2) 1  Delayed Recall (0/5) 5  Orientation (0/6) 6  Total 24  Adjusted Score (based on education) 24       03/09/2024   12:00 PM  MMSE - Mini Mental State Exam  Orientation to time 4  Orientation to Place 5  Registration 3  Attention/ Calculation 5  Recall 3  Language- name 2 objects 2  Language- repeat 1  Language- follow 3 step command 3  Language- read & follow direction 1  Write a sentence 1  Copy design 1  Total score 29       Movement examination: Tone: There is normal tone in the UE/LE Abnormal movements:  no tremor.  No myoclonus.  No asterixis.   Coordination:  There is no decremation with RAM's. Normal finger to nose  Gait and Station: The patient has some difficulty arising out of a deep-seated chair without the use  of the hands. The patient's stride length is good, slow due to hip pain .  Gait is cautious and narrow.   Thank you for allowing us  the opportunity to participate in the care of this nice patient. Please do not hesitate to contact us  for any questions or concerns.   Total time spent on today's visit was 26 minutes dedicated to this patient today, preparing to see patient, examining the patient, ordering tests and/or medications and counseling the patient, documenting clinical information in the EHR or other health record, independently interpreting results and communicating results to the patient/family, discussing treatment and goals, answering patient's questions and coordinating care.  Cc:  [name], MD  Camie Sevin 03/09/2024 12:43 PM

## 2024-03-19 ENCOUNTER — Ambulatory Visit

## 2024-03-19 ENCOUNTER — Ambulatory Visit: Payer: Self-pay | Admitting: Pulmonary Disease

## 2024-03-19 ENCOUNTER — Other Ambulatory Visit: Payer: Self-pay | Admitting: Physician Assistant

## 2024-03-19 ENCOUNTER — Ambulatory Visit (INDEPENDENT_AMBULATORY_CARE_PROVIDER_SITE_OTHER): Admitting: Internal Medicine

## 2024-03-19 ENCOUNTER — Encounter: Payer: Self-pay | Admitting: Internal Medicine

## 2024-03-19 VITALS — BP 124/66 | HR 73 | Temp 98.3°F | Ht 73.5 in | Wt 234.6 lb

## 2024-03-19 DIAGNOSIS — J209 Acute bronchitis, unspecified: Secondary | ICD-10-CM | POA: Diagnosis not present

## 2024-03-19 DIAGNOSIS — Z01818 Encounter for other preprocedural examination: Secondary | ICD-10-CM

## 2024-03-19 MED ORDER — CEFDINIR 300 MG PO CAPS: 300.0000 mg | ORAL_CAPSULE | Freq: Two times a day (BID) | ORAL | 0 refills | Status: DC

## 2024-03-19 NOTE — Telephone Encounter (Signed)
 FYI Only or Action Required?: FYI only for provider.  Patient is followed in Pulmonology for COPD, last seen on 01/24/2024 by Neda Jennet LABOR, MD.  Called Nurse Triage reporting Cough.  Symptoms began several days ago.  Interventions attempted: Rescue inhaler, Maintenance inhaler, and Nebulizer treatments.  Symptoms are: gradually worsening.  Triage Disposition: See Physician Within 24 Hours  Patient/caregiver understands and will follow disposition?: Yes      Copied from CRM #[id number]. Topic: Clinical - Red Word Triage >> Mar 19, 2024  1:38 PM Isabell A wrote: Red Word that prompted transfer to Nurse Triage: Spouse calling, patient has been coughing up green mucous. Reason for Disposition  SEVERE coughing spells (e.g., whooping sound after coughing, vomiting after coughing)  Answer Assessment - Initial Assessment Questions E2C2 Pulmonary Triage - Initial Assessment Questions Chief Complaint (e.g., cough, sob, wheezing, fever, chills, sweat or additional symptoms) *Go to specific symptom protocol after initial questions. Productive green cough Wife reports that he is on azithromycin  for long term sx management, but sx seem to be worsenging  How long have symptoms been present? X 2 days  Have you tested for COVID or Flu? Note: If not, ask patient if a home test can be taken. If so, instruct patient to call back for positive results. No  MEDICINES:   Have you used any OTC meds to help with symptoms? No If yes, ask What medications? N/a  Have you used your inhalers/maintenance medication? Yes If yes, What medications? Spiriva  - 2 puffs daily Albuterol  Neb - 4x day WIXELA INHUB - as prescribed Albuterol  INH - 4 x a day  If inhaler, ask How many puffs and how often? Note: Review instructions on medication in the chart. See above  OXYGEN: Do you wear supplemental oxygen? No If yes, How many liters are you supposed to use? N/a  Do you monitor your  oxygen levels? No If yes, What is your reading (oxygen level) today? 94  What is your usual oxygen saturation reading?  (Note: Pulmonary O2 sats should be 90% or greater) N/a      1. ONSET: When did the cough begin?      See above 2. SEVERITY: How bad is the cough today?      See above 3. SPUTUM: Describe the color of your sputum (e.g., none, dry cough; clear, white, yellow, green)     green 4. HEMOPTYSIS: Are you coughing up any blood? If Yes, ask: How much? (e.g., flecks, streaks, tablespoons, etc.)     denies 5. DIFFICULTY BREATHING: Are you having difficulty breathing? If Yes, ask: How bad is it? (e.g., mild, moderate, severe)      Denies - wife reports at baseline SOB Of note, triager can hear pt in back grown speaking in full sentences. 6. FEVER: Do you have a fever? If Yes, ask: What is your temperature, how was it measured, and when did it start?     denies 7. CARDIAC HISTORY: Do you have any history of heart disease? (e.g., heart attack, congestive heart failure)      CAD per problems list 8. LUNG HISTORY: Do you have any history of lung disease?  (e.g., pulmonary embolus, asthma, emphysema)     COPD  9. PE RISK FACTORS: Do you have a history of blood clots? (or: recent major surgery, recent prolonged travel, bedridden)     denies 10. OTHER SYMPTOMS: Do you have any other symptoms? (e.g., runny nose, wheezing, chest pain)       Occasional  chest pain, chest tightness r/t coughing 11. PREGNANCY: Is there any chance you are pregnant? When was your last menstrual period?       N/a 12. TRAVEL: Have you traveled out of the country in the last month? (e.g., travel history, exposures)       N/a  Protocols used: Cough - Acute Productive-A-AH

## 2024-03-19 NOTE — Progress Notes (Unsigned)
 [name], male    DOB: 20-Apr-1951   MRN: [id number]   Brief patient profile:  74  yobm  never smoker carries dx of asbestos last exp 1975  self referred to pulmonary clinic 03/19/2024  for cough with green sputum x 4 weeks   assoc worse doe while on zmax  three times per week       History of Present Illness  03/19/2024  Pulmonary/ ACUTE  office eval/Oris Calmes  advair one bid / spiriva   and neb qid zpak three times per week  Chief Complaint  Patient presents with   Acute Visit    Cough with green sputum over the past 4 wks. He has had chest tightness and increased SOB x 2 wks.   Dyspnea:  worse vs baseline since onset of cough . Very sedentary at baseline Cough: worse x 4 weeks on  three times per week zmax  rx  Sleep: on BIPAP  SABA use: 3 daily neb even when feeling bettter 02 ldz:[id number]     No obvious day to day or daytime pattern/variability or assoc   mucus plugs or hemoptysis or cp or chest tightness, subjective wheeze or overt sinus or hb symptoms.    Also denies any obvious fluctuation of symptoms with weather or environmental changes or other aggravating or alleviating factors except as outlined above   No unusual exposure hx or h/o childhood pna/ asthma or knowledge of premature birth.  Current Allergies, Complete Past Medical History, Past Surgical History, Family History, and Social History were reviewed in [employer] record.  ROS  The following are not active complaints unless bolded Hoarseness, sore throat, dysphagia, dental problems, itching, sneezing,  nasal congestion or discharge of excess mucus or purulent secretions, ear ache,   fever, chills, sweats, unintended wt loss or wt gain, classically pleuritic or exertional cp,  orthopnea pnd or arm/hand swelling  or leg swelling, presyncope, palpitations, abdominal pain, anorexia, nausea, vomiting, diarrhea  or change in bowel habits or change in bladder habits, change in stools or change in urine,  dysuria, hematuria,  rash, arthralgias, visual complaints, headache, numbness, weakness or ataxia or problems with walking or coordination,  change in mood or  memory.             Outpatient Medications Prior to Visit  Medication Sig Dispense Refill   albuterol  (PROVENTIL ) (2.5 MG/3ML) 0.083% nebulizer solution Take 3 mLs (2.5 mg total) by nebulization every 4 (four) hours as needed for wheezing or shortness of breath. 1080 mL 2   albuterol  (VENTOLIN  HFA) 108 (90 Base) MCG/ACT inhaler Inhale 2 puffs into the lungs every 6 (six) hours as needed for wheezing or shortness of breath. 8 g 5   amLODipine (NORVASC) 10 MG tablet Take 10 mg by mouth daily.     aspirin  EC 81 MG tablet Take 81 mg by mouth daily. Swallow whole.     azithromycin  (ZITHROMAX  Z-PAK) 250 MG tablet Take 1 tablet (250 mg total) by mouth 3 (three) times a week. 12 tablet 5   b complex vitamins capsule Take 1 capsule by mouth daily.     buPROPion (WELLBUTRIN SR) 150 MG 12 hr tablet Take 150 mg by mouth daily.     carbamide peroxide (DEBROX) 6.5 % OTIC solution Place 5 drops into both ears as needed (ear wax).     Cholecalciferol (VITAMIN D3) 1000 UNITS CAPS Take 1,000 Units by mouth every morning.     empagliflozin  (JARDIANCE ) 25 MG TABS tablet Take  25 mg by mouth daily. Take 1/2 tablet every morning     FERGON 240 (27 Fe) MG tablet Take 240 mg by mouth daily.     fexofenadine (ALLEGRA) 180 MG tablet Take 180 mg by mouth daily.     fluticasone  (FLONASE ) 50 MCG/ACT nasal spray Place 1 spray into both nostrils in the morning and at bedtime.     fluticasone -salmeterol (WIXELA INHUB) 250-50 MCG/ACT AEPB Inhale 1 puff into the lungs in the morning and at bedtime. 1 each 5   gabapentin  (NEURONTIN ) 400 MG capsule Take 400 mg by mouth 3 (three) times daily.     hydrALAZINE (APRESOLINE) 50 MG tablet Take 50 mg by mouth 3 (three) times daily.     hydrOXYzine (ATARAX/VISTARIL) 10 MG tablet Take 20 mg by mouth at bedtime.     losartan  (COZAAR) 100 MG tablet Take 100 mg by mouth every evening.     melatonin 3 MG TABS tablet Take 3 mg by mouth at bedtime.     metFORMIN  (GLUCOPHAGE ) 500 MG tablet Take 500 mg by mouth daily with breakfast.     modafinil  (PROVIGIL ) 100 MG tablet Take 200 mg (2 tablets) in the morning and 100 mg (one tablet) at noon as directed 90 tablet 5   NON FORMULARY BIPAP at bedtime     omeprazole  (PRILOSEC  OTC) 20 MG tablet Take 1 tablet (20 mg total) by mouth daily. 30 tablet 2   PARoxetine  (PAXIL ) 40 MG tablet Take 40 mg by mouth at bedtime.     Polyethyl Glycol-Propyl Glycol (SYSTANE OP) Apply to eye. par4n     pravastatin (PRAVACHOL) 40 MG tablet Take 40 mg by mouth at bedtime.     Probiotic Product (PROBIOTIC DAILY PO) Take 1 capsule by mouth daily.     QUEtiapine (SEROQUEL) 25 MG tablet Take 50 mg by mouth at bedtime.     spironolactone (ALDACTONE) 25 MG tablet Take 50 mg by mouth daily.      tamsulosin (FLOMAX) 0.4 MG CAPS capsule Take 0.4 mg by mouth every morning.     Tiotropium Bromide Monohydrate  (SPIRIVA  RESPIMAT) 2.5 MCG/ACT AERS Inhale 2 puffs into the lungs daily. 1 g 5   triamcinolone ointment (KENALOG) 0.1 % Apply 1 application  topically daily as needed (rash).     Turmeric 400 MG CAPS Take 400 mg by mouth daily.     vitamin E  400 UNIT capsule Take 400 Units by mouth daily.     No facility-administered medications prior to visit.    Past Medical History:  Diagnosis Date   Abnormal CT scan of lung 07/23/2021   Abnormality of gait    Allergic rhinitis due to pollen 11/25/2010   Bilateral primary osteoarthritis of hip 12/12/2019   Carpal tunnel syndrome on right    Contact with and (suspected) exposure to asbestos 07/23/2021   Controlled narcolepsy 12/15/2015   COPD (chronic obstructive pulmonary disease)    Coronary artery disease    Degeneration of cervical intervertebral disc    Degenerative joint disease involving multiple joints 11/25/2010   Dysphagia    Dyspnea on exertion     Dystrophia unguium    Essential (primary) hypertension 11/25/2010   Generalized anxiety disorder    GERD (gastroesophageal reflux disease)    History of total knee arthroplasty    Billateral total knee joint arthroplasty.   Hyperlipidemia    Hypothyroidism    Klippel-Feil sequence    Learning disability, unspecified 1997   Reading/reading comprehension   Left inguinal  hernia    Low back pain 12/12/2019   Lumbar spondylosis    Major depressive disorder    Mild cognitive impairment 10/18/2022   Nonspecific abnormal electrocardiogram (ECG) (EKG)    Obstructive sleep apnea 12/15/2015   Osteoarthritis    Osteoporosis    Pain in joints of right hand    Peripheral neuropathy    Peripheral venous insufficiency    Pituitary abnormality    pituitary edema; takes bromocriptine    PTSD (post-traumatic stress disorder) 11/25/2010   Spinal stenosis of lumbar region 09/14/2018   Type 2 diabetes mellitus without complications 11/25/2010   Vitamin D deficiency    Weakness of left leg    Wears glasses       Objective:     BP 124/66   Pulse 73   Temp 98.3 F (36.8 C) (Oral)   Ht 6' 1.5 (1.867 m)   Wt 234 lb 9.6 oz (106.4 kg)   SpO2 99% Comment: on RA  BMI 30.53 kg/m   SpO2: 99 % (on RA)  Amb somber bm nad    HEENT : Oropharynx  clear      Nasal turbinates nl    NECK :  without  apparent JVD/ palpable Nodes/TM    LUNGS: no acc muscle use,  Nl contour chest which is clear to A and P bilaterally without cough on insp or exp maneuvers   CV:  RRR  no s3 or murmur or increase in P2, and no edema   ABD:  soft and nontender   MS:  Gait nl   ext warm without deformities Or obvious joint restrictions  calf tenderness, cyanosis or clubbing    SKIN: warm and dry without lesions    NEURO:  alert, approp, nl sensorium with  no motor or cerebellar deficits apparent.    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  Total time for H and P, chart review, counseling, reviewing hfa/ neb device(s) and generating customized AVS unique to this office visit / same day charting = 31 min with pt new to me          Ozell America, MD 03/19/2024

## 2024-03-19 NOTE — Patient Instructions (Addendum)
 Omnicef  300 mg twice daily x 10 days   For cough/ congestion > mucinex or mucinex dm  up to maximum of  1200 mg every 12 hours as needed   Please remember to go to the  x-ray department  for your tests - we will call you with the results when they are available    Also  Ok to try albuterol  15 min before an activity (on alternating days)  that you know would usually make you short of breath and see if it makes any difference and if makes none then don't take albuterol  after activity unless you can't catch your breath as this means it's the resting that helps, not the albuterol .      Keep appt your with Dr 0 for September

## 2024-03-20 NOTE — Assessment & Plan Note (Addendum)
 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  Total time for H and P, chart review, counseling, reviewing hfa/ neb device(s) and generating customized AVS unique to this office visit / same day charting = 31 min with pt new to me

## 2024-03-20 NOTE — Progress Notes (Signed)
 COVID Vaccine received:  []  No [x]  Yes Date of any COVID positive Test in last 90 days:  PCP - Camellia Hutchinson,  MD [phone]      Tanda Rummer, MD is PCP at [facility] also Cardiologist - Madonna Large, DO Pulmonology- Jennet Epley, MD  Neurology- Camie Sevin, PA-C  Chest x-ray - 03-19-2024  2v  Epic EKG -  07-26-2023  Epic Stress Test - [facility] 12-24-2019  Epic ECHO - 12-18-2019  Epic Cardiac Cath - 09-28-2004 [facility] by Dr. Levern CT Coronary Calcium score: 89.6 on 06-16-2021 Epic  Pacemaker / ICD device [x]  No []  Yes   Spinal Cord Stimulator:[x]  No []  Yes       History of Sleep Apnea? []  No [x]  Yes   CPAP used?- []  No [x]  Yes  BiPAP  Patient has: []  NO Hx DM   []  Pre-DM   []  DM1  [x]   DM2 Does the patient monitor blood sugar?   []  N/A   []  No [x]  Yes  Last A1c was: 6.3  on 11-03-2023 at the [facility] Does patient have a Freestyle [place] or Dexcom? []  No [x]  Yes   Fasting Blood Sugar Ranges-  Checks Blood Sugar continuous times a day  Blood Thinner / Instructions:  none Aspirin  Instructions:   ASA 81 mg  Hold 7 days per patient.   ERAS Protocol Ordered: []  No  [x]  Yes PRE-SURGERY []  ENSURE  [x]  G2  Patient is to be NPO after: 0930  Dental hx: []  Dentures:  []  N/A      [x]  Bridge or Partial: Partials on top and bottom                  []  Loose or Damaged teeth:   Comments: Patient was given the 5 CHG shower / bath instructions for THA surgery along with 2 bottles of the CHG soap. Patient will start this on:    03-26-2024           Activity level: Able to walk up 2 flights of stairs without becoming significantly short of breath or having chest pain?  []  No   [x]    Yes  Patient can perform ADLs without assistance. []  No   [x]   Yes  Anesthesia review: DM2, HTN, COPD- asbestosis, OSA-BiPAP, narcolepsy, GAD, MDD, PTSD, Cognitive impairment (Reading comprehension, some memory loss),  CKD2  Patient was lucid and appropriate with his answers to all my PST questions. His wife  was present and corrected some of his answers. He has been taking all  his medications as directed with no lapses.   Patient denies any S&S of respiratory illness or Covid - no shortness of breath, fever, cough or chest pain at PAT appointment.  Patient verbalized understanding and agreement to the Pre-Surgical Instructions that were given to them at this PAT appointment. Patient was also educated of the need to review these PAT instructions again prior to his surgery.I reviewed the appropriate phone numbers to call if they have any and questions or concerns.

## 2024-03-20 NOTE — Patient Instructions (Signed)
 SURGICAL WAITING ROOM VISITATION Patients having surgery or a procedure may have no more than 2 support people in the waiting area - these visitors may rotate in the visitor waiting room.   If the patient needs to stay at the hospital during part of their recovery, the visitor guidelines for inpatient rooms apply.  PRE-OP VISITATION  Pre-op nurse will coordinate an appropriate time for 1 support person to accompany the patient in pre-op.  This support person may not rotate.  This visitor will be contacted when the time is appropriate for the visitor to come back in the pre-op area.  Please refer to the [facility] website for the visitor guidelines for Inpatients (after your surgery is over and you are in a regular room).  You are not required to quarantine at this time prior to your surgery. However, you must do this: Hand Hygiene often Do NOT share personal items Notify your provider if you are in close contact with someone who has COVID or you develop fever 100.4 or greater, new onset of sneezing, cough, sore throat, shortness of breath or body aches.  If you test positive for Covid or have been in contact with anyone that has tested positive in the last 10 days please notify you surgeon.    Your procedure is scheduled on:  FRIDAY  March 30, 2024  Report to [facility] Main Entrance: [place] entrance where the [employer] is available.   Report to admitting at:  10:00   AM  Call this number if you have any questions or problems the morning of surgery [phone]  Do not eat food after Midnight the night prior to your surgery/procedure.  After Midnight you may have the following liquids until  09:30 AM  DAY OF SURGERY  Clear Liquid Diet Water Black Coffee (sugar ok, NO MILK/CREAM OR CREAMERS)  Tea (sugar ok, NO MILK/CREAM OR CREAMERS) regular and decaf                             Plain Jell-O  with no fruit (NO RED)                                           Fruit ices  (not with fruit pulp, NO RED)                                     Popsicles (NO RED)                                                                  Juice: NO CITRUS JUICES: only apple, WHITE grape, WHITE cranberry Sports drinks like Gatorade or Powerade (NO RED)                   The day of surgery:  Drink ONE (1) Pre-Surgery G2 at  09:30 AM the morning of surgery. Drink in one sitting. Do not sip.  This drink was given to you during your hospital pre-op appointment visit. Nothing else to drink after completing  the Pre-Surgery G2 : No candy, chewing gum or throat lozenges.    FOLLOW ANY ADDITIONAL PRE OP INSTRUCTIONS YOU RECEIVED FROM YOUR SURGEON'S OFFICE!!!   Oral Hygiene is also important to reduce your risk of infection.        Remember - BRUSH YOUR TEETH THE MORNING OF SURGERY WITH YOUR REGULAR TOOTHPASTE  Do NOT smoke after Midnight the night before surgery.  STOP TAKING all Vitamins, Herbs and supplements 1 week before your surgery.   ASPIRIN - Stop taking 1 week before your surgery.  Take ONLY these medicines the morning of surgery with A SIP OF WATER: Omeprazole , Tamsulosin, Cefdinir  antibiotic, amlodipine, Hydralazine, Bupropion, fexofenadine, gabapentin . You may use your inhalers / nebulizer if needed. (Please bring your Albuterol  Inhaler with you on the day of surgery) You may use your Flonase  nasal spray and Eye drops if needed.   DO NOT TAKE Spironolactone, Modafinil  (Provigil ) or Losartan the morning of your surgery.   If You have been diagnosed with Sleep Apnea - Bring CPAP mask and tubing day of surgery. We will provide you with a CPAP machine on the day of your surgery.                   You may not have any metal on your body including  jewelry, and body piercing  Do not wear  lotions, powders, cologne, or deodorant  Men may shave face and neck.  Contacts, Hearing Aids, dentures or bridgework may not be worn into surgery. DENTURES WILL BE REMOVED PRIOR TO  SURGERY PLEASE DO NOT APPLY Poly grip OR ADHESIVES!!!  You may bring a small overnight bag with you on the day of surgery, only pack items that are not valuable. [facility] IS NOT RESPONSIBLE   FOR VALUABLES THAT ARE LOST OR STOLEN.   Do not bring your home medications to the hospital. The Pharmacy will dispense medications listed on your medication list to you during your admission in the Hospital.  Please read over the following fact sheets you were given: IF YOU HAVE QUESTIONS ABOUT YOUR PRE-OP INSTRUCTIONS, PLEASE CALL [phone].     Pre-operative 5 CHG Bath Instructions   You can play a key role in reducing the risk of infection after surgery. Your skin needs to be as free of germs as possible. You can reduce the number of germs on your skin by washing with CHG (chlorhexidine  gluconate) soap before surgery. CHG is an antiseptic soap that kills germs and continues to kill germs even after washing.   DO NOT use if you have an allergy to chlorhexidine /CHG or antibacterial soaps. If your skin becomes reddened or irritated, stop using the CHG and notify one of our RNs at [phone]  Please shower with the CHG soap starting 4 days before surgery using the following schedule: START SHOWERS ON   MONDAY  March 26, 2024  Please keep in mind the following:  DO NOT shave, including legs and underarms, starting the day of your first shower.   You may shave your face at any point before/day of surgery.   Place clean sheets on your bed the day you start using CHG soap. Use a clean washcloth (not used since being washed) for each shower. DO NOT sleep with pets once you start using the CHG.   CHG Shower Instructions:  If you choose to wash your hair and private area, wash first with your normal shampoo/soap.  After you  use shampoo/soap, rinse your hair and body thoroughly to remove shampoo/soap residue.  Turn the water OFF and apply about 3 tablespoons (45 ml) of CHG soap to a CLEAN washcloth.  Apply CHG soap ONLY FROM YOUR NECK DOWN TO YOUR TOES (washing for 3-5 minutes)  DO NOT use CHG soap on face, private areas, open wounds, or sores.  Pay special attention to the area where your surgery is being performed.  If you are having back surgery, having someone wash your back for you may be helpful.  Wait 2 minutes after CHG soap is applied, then you may rinse off the CHG soap.  Pat dry with a clean towel  Put on clean clothes/pajamas   If you choose to wear lotion, please use ONLY the CHG-compatible lotions on the back of this paper.     Additional instructions for the day of surgery: DO NOT APPLY any lotions, deodorants, cologne, or perfumes.   Put on clean/comfortable clothes.  Brush your teeth.  Ask your nurse before applying any prescription medications to the skin.      CHG Compatible Lotions   Aveeno Moisturizing lotion  Cetaphil Moisturizing Cream  Cetaphil Moisturizing Lotion  Clairol Herbal Essence Moisturizing Lotion, Dry Skin  Clairol Herbal Essence Moisturizing Lotion, Extra Dry Skin  Clairol Herbal Essence Moisturizing Lotion, Normal Skin  Curel Age Defying Therapeutic Moisturizing Lotion with Alpha Hydroxy  Curel Extreme Care Body Lotion  Curel Soothing Hands Moisturizing Hand Lotion  Curel Therapeutic Moisturizing Cream, Fragrance-Free  Curel Therapeutic Moisturizing Lotion, Fragrance-Free  Curel Therapeutic Moisturizing Lotion, Original Formula  Eucerin Daily Replenishing Lotion  Eucerin Dry Skin Therapy Plus Alpha Hydroxy Crme  Eucerin Dry Skin Therapy Plus Alpha Hydroxy Lotion  Eucerin Original Crme  Eucerin Original Lotion  Eucerin Plus Crme Eucerin Plus Lotion  Eucerin TriLipid Replenishing Lotion  Keri Anti-Bacterial Hand Lotion  Keri Deep Conditioning Original  Lotion Dry Skin Formula Softly Scented  Keri Deep Conditioning Original Lotion, Fragrance Free Sensitive Skin Formula  Keri Lotion Fast Absorbing Fragrance Free Sensitive Skin Formula  Keri Lotion Fast Absorbing Softly Scented Dry Skin Formula  Keri Original Lotion  Keri Skin Renewal Lotion Keri Silky Smooth Lotion  Keri Silky Smooth Sensitive Skin Lotion  Nivea Body Creamy Conditioning Oil  Nivea Body Extra Enriched Lotion  Nivea Body Original Lotion  Nivea Body Sheer Moisturizing Lotion Nivea Crme  Nivea Skin Firming Lotion  NutraDerm 30 Skin Lotion  NutraDerm Skin Lotion  NutraDerm Therapeutic Skin Cream  NutraDerm Therapeutic Skin Lotion  ProShield Protective Hand Cream  Provon moisturizing lotion   FAILURE TO FOLLOW THESE INSTRUCTIONS MAY RESULT IN THE CANCELLATION OF YOUR SURGERY  PATIENT SIGNATURE_________________________________  NURSE SIGNATURE__________________________________  ________________________________________________________________________         [name]    An incentive spirometer is a tool that can help keep your lungs clear and active. This tool measures how well you are filling your lungs with each  breath. Taking long deep breaths may help reverse or decrease the chance of developing breathing (pulmonary) problems (especially infection) following: A long period of time when you are unable to move or be active. BEFORE THE PROCEDURE  If the spirometer includes an indicator to show your best effort, your nurse or respiratory therapist will set it to a desired goal. If possible, sit up straight or lean slightly forward. Try not to slouch. Hold the incentive spirometer in an upright position. INSTRUCTIONS FOR USE  Sit on the edge of your bed if possible, or sit up as far as you can in bed or on a chair. Hold the incentive spirometer in an upright position. Breathe out normally. Place the mouthpiece in your mouth and seal your lips  tightly around it. Breathe in slowly and as deeply as possible, raising the piston or the ball toward the top of the column. Hold your breath for 3-5 seconds or for as long as possible. Allow the piston or ball to fall to the bottom of the column. Remove the mouthpiece from your mouth and breathe out normally. Rest for a few seconds and repeat Steps 1 through 7 at least 10 times every 1-2 hours when you are awake. Take your time and take a few normal breaths between deep breaths. The spirometer may include an indicator to show your best effort. Use the indicator as a goal to work toward during each repetition. After each set of 10 deep breaths, practice coughing to be sure your lungs are clear. If you have an incision (the cut made at the time of surgery), support your incision when coughing by placing a pillow or rolled up towels firmly against it. Once you are able to get out of bed, walk around indoors and cough well. You may stop using the incentive spirometer when instructed by your caregiver.  RISKS AND COMPLICATIONS Take your time so you do not get dizzy or light-headed. If you are in pain, you may need to take or ask for pain medication before doing incentive spirometry. It is harder to take a deep breath if you are having pain. AFTER USE Rest and breathe slowly and easily. It can be helpful to keep track of a log of your progress. Your caregiver can provide you with a simple table to help with this. If you are using the spirometer at home, follow these instructions: SEEK MEDICAL CARE IF:  You are having difficultly using the spirometer. You have trouble using the spirometer as often as instructed. Your pain medication is not giving enough relief while using the spirometer. You develop fever of 100.5 F (38.1 C) or higher.                                                                                                    SEEK IMMEDIATE MEDICAL CARE IF:  You cough up bloody sputum that  had not been present before. You develop fever of 102 F (38.9 C) or greater. You develop worsening pain at or near the incision site. MAKE SURE YOU:  Understand these instructions. Will watch  your condition. Will get help right away if you are not doing well or get worse. Document Released: 12/13/2006 Document Revised: 10/25/2011 Document Reviewed: 02/13/2007 [facility] Patient Information 2014 [place], [place].         WHAT IS A BLOOD TRANSFUSION? Blood Transfusion Information  A transfusion is the replacement of blood or some of its parts. Blood is made up of multiple cells which provide different functions. Red blood cells carry oxygen and are used for blood loss replacement. White blood cells fight against infection. Platelets control bleeding. Plasma helps clot blood. Other blood products are available for specialized needs, such as hemophilia or other clotting disorders. BEFORE THE TRANSFUSION  Who gives blood for transfusions?  Healthy volunteers who are fully evaluated to make sure their blood is safe. This is blood bank blood. Transfusion therapy is the safest it has ever been in the practice of medicine. Before blood is taken from a donor, a complete history is taken to make sure that person has no history of diseases nor engages in risky social behavior (examples are intravenous drug use or sexual activity with multiple partners). The donor's travel history is screened to minimize risk of transmitting infections, such as malaria. The donated blood is tested for signs of infectious diseases, such as HIV and hepatitis. The blood is then tested to be sure it is compatible with you in order to minimize the chance of a transfusion reaction. If you or a relative donates blood, this is often done in anticipation of surgery and is not appropriate for emergency situations. It takes many days to process the donated blood. RISKS AND COMPLICATIONS Although transfusion therapy is very safe  and saves many lives, the main dangers of transfusion include:  Getting an infectious disease. Developing a transfusion reaction. This is an allergic reaction to something in the blood you were given. Every precaution is taken to prevent this. The decision to have a blood transfusion has been considered carefully by your caregiver before blood is given. Blood is not given unless the benefits outweigh the risks. AFTER THE TRANSFUSION Right after receiving a blood transfusion, you will usually feel much better and more energetic. This is especially true if your red blood cells have gotten low (anemic). The transfusion raises the level of the red blood cells which carry oxygen, and this usually causes an energy increase. The nurse administering the transfusion will monitor you carefully for complications. HOME CARE INSTRUCTIONS  No special instructions are needed after a transfusion. You may find your energy is better. Speak with your caregiver about any limitations on activity for underlying diseases you may have. SEEK MEDICAL CARE IF:  Your condition is not improving after your transfusion. You develop redness or irritation at the intravenous (IV) site. SEEK IMMEDIATE MEDICAL CARE IF:  Any of the following symptoms occur over the next 12 hours: Shaking chills. You have a temperature by mouth above 102 F (38.9 C), not controlled by medicine. Chest, back, or muscle pain. People around you feel you are not acting correctly or are confused. Shortness of breath or difficulty breathing. Dizziness and fainting. You get a rash or develop hives. You have a decrease in urine output. Your urine turns a dark color or changes to pink, red, or brown. Any of the following symptoms occur over the next 10 days: You have a temperature by mouth above 102 F (38.9 C), not controlled by medicine. Shortness of breath. Weakness after normal activity. The white part of the eye turns yellow (  jaundice). You have  a decrease in the amount of urine or are urinating less often. Your urine turns a dark color or changes to pink, red, or brown. Document Released: 07/30/2000 Document Revised: 10/25/2011 Document Reviewed: 03/18/2008 [facility] Patient Information 2014 [place], [place].  _______________________________________________________________________         If you would like to see a video about joint replacement:   [email or web address]

## 2024-03-21 ENCOUNTER — Encounter (HOSPITAL_COMMUNITY)
Admission: RE | Admit: 2024-03-21 | Discharge: 2024-03-21 | Disposition: A | Discharge status: Home / Self Care | Source: Ambulatory Visit | Attending: Orthopaedic Surgery | Admitting: Orthopaedic Surgery

## 2024-03-21 ENCOUNTER — Encounter (HOSPITAL_COMMUNITY): Payer: Self-pay

## 2024-03-21 ENCOUNTER — Other Ambulatory Visit: Payer: Self-pay

## 2024-03-21 VITALS — BP 148/63 | HR 64 | Temp 98.2°F | Resp 20 | Ht 73.5 in | Wt 234.0 lb

## 2024-03-21 DIAGNOSIS — Z7984 Long term (current) use of oral hypoglycemic drugs: Secondary | ICD-10-CM | POA: Diagnosis not present

## 2024-03-21 DIAGNOSIS — Z01812 Encounter for preprocedural laboratory examination: Secondary | ICD-10-CM | POA: Insufficient documentation

## 2024-03-21 DIAGNOSIS — J449 Chronic obstructive pulmonary disease, unspecified: Secondary | ICD-10-CM | POA: Insufficient documentation

## 2024-03-21 DIAGNOSIS — E119 Type 2 diabetes mellitus without complications: Secondary | ICD-10-CM

## 2024-03-21 DIAGNOSIS — M1612 Unilateral primary osteoarthritis, left hip: Secondary | ICD-10-CM | POA: Diagnosis not present

## 2024-03-21 DIAGNOSIS — G47419 Narcolepsy without cataplexy: Secondary | ICD-10-CM | POA: Diagnosis not present

## 2024-03-21 DIAGNOSIS — N189 Chronic kidney disease, unspecified: Secondary | ICD-10-CM | POA: Diagnosis not present

## 2024-03-21 DIAGNOSIS — I2510 Atherosclerotic heart disease of native coronary artery without angina pectoris: Secondary | ICD-10-CM | POA: Insufficient documentation

## 2024-03-21 DIAGNOSIS — E1122 Type 2 diabetes mellitus with diabetic chronic kidney disease: Secondary | ICD-10-CM | POA: Insufficient documentation

## 2024-03-21 DIAGNOSIS — E039 Hypothyroidism, unspecified: Secondary | ICD-10-CM | POA: Diagnosis not present

## 2024-03-21 DIAGNOSIS — I10 Essential (primary) hypertension: Secondary | ICD-10-CM | POA: Diagnosis not present

## 2024-03-21 DIAGNOSIS — G4730 Sleep apnea, unspecified: Secondary | ICD-10-CM | POA: Diagnosis not present

## 2024-03-21 DIAGNOSIS — Z01818 Encounter for other preprocedural examination: Secondary | ICD-10-CM | POA: Diagnosis present

## 2024-03-21 HISTORY — DX: Other complications of anesthesia, initial encounter: T88.59XA

## 2024-03-21 HISTORY — DX: Chronic kidney disease, unspecified: N18.9

## 2024-03-21 LAB — BASIC METABOLIC PANEL WITH GFR
Anion gap: 11 (ref 5–15)
BUN: 22 mg/dL (ref 8–23)
CO2: 24 mmol/L (ref 22–32)
Calcium: 9.5 mg/dL (ref 8.9–10.3)
Chloride: 103 mmol/L (ref 98–111)
Creatinine, Ser: 1.47 mg/dL — ABNORMAL HIGH (ref 0.61–1.24)
GFR, Estimated: 50 mL/min — ABNORMAL LOW (ref >60)
Glucose, Bld: 110 mg/dL — ABNORMAL HIGH (ref 70–99)
Potassium: 4.2 mmol/L (ref 3.5–5.1)
Sodium: 138 mmol/L (ref 135–145)

## 2024-03-21 LAB — GLUCOSE, CAPILLARY: Glucose-Capillary: 122 mg/dL — ABNORMAL HIGH (ref 70–99)

## 2024-03-21 LAB — CBC
HCT: 41.8 % (ref 39.0–52.0)
Hemoglobin: 12.8 g/dL — ABNORMAL LOW (ref 13.0–17.0)
MCH: 23.1 pg — ABNORMAL LOW (ref 26.0–34.0)
MCHC: 30.6 g/dL (ref 30.0–36.0)
MCV: 75.5 fL — ABNORMAL LOW (ref 80.0–100.0)
Platelets: 292 K/uL (ref 150–400)
RBC: 5.54 MIL/uL (ref 4.22–5.81)
RDW: 15.7 % — ABNORMAL HIGH (ref 11.5–15.5)
WBC: 6.1 K/uL (ref 4.0–10.5)
nRBC: 0 % (ref 0.0–0.2)

## 2024-03-21 LAB — SURGICAL PCR SCREEN
MRSA, PCR: NEGATIVE
Staphylococcus aureus: POSITIVE — AB

## 2024-03-22 LAB — HEMOGLOBIN A1C
Hgb A1c MFr Bld: 6.4 % — ABNORMAL HIGH (ref 4.8–5.6)
Mean Plasma Glucose: 137 mg/dL

## 2024-03-22 NOTE — Progress Notes (Signed)
 Patient's PCR screen is positive for STAPH. Appropriate notes have been placed on the patient's chart. This note has been routed to Dr. Medford Poli and Bertrum Gaskins PA for review. The Patient's surgery is currently scheduled for: 03-30-24 at [facility].  Shawnee Aloe, BSN, CVRN-BC   Pre-Surgical Testing Nurse [facility]  [phone]

## 2024-03-23 HISTORY — PX: CATARACT EXTRACTION: SUR2

## 2024-03-23 NOTE — Progress Notes (Signed)
 Anesthesia Chart Review   Case: [id number] Date/Time: 03/30/24 1215   Procedure: ARTHROPLASTY, HIP, TOTAL, ANTERIOR APPROACH (Left: Hip)   Anesthesia type: Spinal   Diagnosis: Primary osteoarthritis of left hip [M16.12]   Pre-op diagnosis: OSTEOARTHRITIS LEFT HIP   Location: [facility] ROOM 09 / [facility]   Surgeons: Vernetta Lonni GRADE, MD       DISCUSSION:73 y.o. never smoker with h/o HTN, sleep apnea on BiPAP, narcolepsy, COPD, hypothyroidism, Mild nonobstructive CAD per coronary CTA from November 2022, DM II, CKD, left hip OA scheduled for above procedure 03/30/2024 with Dr. Lonni Vernetta.   Pt last seen by cardiology 07/26/2023, stable at this visit with 1 year follow up recommended.   Last seen by neurology 03/09/2024, stable at this visit.  No indication for dementia medications at that time.  1 year follow up recommended.   At last pulmonology visit 03/19/24 pt treated for acute bronchitis.  Case should be postponed until pt is back to baseline for 2 weeks.  VS: BP (!) 148/63 Comment: right arm sitting  Pulse 64   Temp 36.8 C (Oral)   Resp 20   Ht 6' 1.5 (1.867 m)   Wt 106.1 kg   SpO2 100%   BMI 30.45 kg/m   PROVIDERS: [name], MD is PCP  Cardiologist - Madonna Large, DO Pulmonology- Jennet Epley, MD   LABS: Labs reviewed: Acceptable for surgery. (all labs ordered are listed, but only abnormal results are displayed)  Labs Reviewed  SURGICAL PCR SCREEN - Abnormal; Notable for the following components:      Result Value   Staphylococcus aureus POSITIVE (*)    All other components within normal limits  CBC - Abnormal; Notable for the following components:   Hemoglobin 12.8 (*)    MCV 75.5 (*)    MCH 23.1 (*)    RDW 15.7 (*)    All other components within normal limits  BASIC METABOLIC PANEL WITH GFR - Abnormal; Notable for the following components:   Glucose, Bld 110 (*)    Creatinine, Ser 1.47 (*)    GFR, Estimated 50 (*)    All other components within  normal limits  HEMOGLOBIN A1C - Abnormal; Notable for the following components:   Hgb A1c MFr Bld 6.4 (*)    All other components within normal limits  GLUCOSE, CAPILLARY - Abnormal; Notable for the following components:   Glucose-Capillary 122 (*)    All other components within normal limits  TYPE AND SCREEN     IMAGES:   EKG:   CV:  Past Medical History:  Diagnosis Date   Abnormal CT scan of lung 07/23/2021   Abnormality of gait    Allergic rhinitis due to pollen 11/25/2010   Bilateral primary osteoarthritis of hip 12/12/2019   Carpal tunnel syndrome on right    Chronic kidney disease    Complication of anesthesia    Hard to wake up, has narcolepsy   Contact with and (suspected) exposure to asbestos 07/23/2021   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  Hyperlipidemia    Hypothyroidism    Klippel-Feil sequence    Learning disability, unspecified 1997   Reading/reading comprehension   Left inguinal hernia    Low back pain 12/12/2019   Lumbar spondylosis    Major depressive disorder    Mild cognitive impairment 10/18/2022   Nonspecific abnormal electrocardiogram (ECG) (EKG)    Obstructive sleep apnea 12/15/2015   BiPAP   Osteoarthritis    Osteoporosis    Pain in joints of right hand    Peripheral neuropathy    Peripheral venous insufficiency    Pituitary abnormality    pituitary edema; takes bromocriptine    PTSD (post-traumatic stress disorder) 11/25/2010   Spinal stenosis of lumbar region 09/14/2018   Type 2 diabetes  mellitus without complications 11/25/2010   Vitamin D deficiency    Weakness of left leg    Wears glasses     Past Surgical History:  Procedure Laterality Date   CARDIAC CATHETERIZATION  2006   LHC by Dr. Levern ORIN MEDIATE RELEASE Right 10/12/2022   Procedure: Right CARPAL TUNNEL RELEASE;  Surgeon: Gillie Duncans, MD;  Location: [facility] OR;  Service: Neurosurgery;  Laterality: Right;  RM 20   CERVICAL DISC SURGERY     x 2    Posterior approach on both   COLONOSCOPY     EYE SURGERY Right 10/08/2019   cataract extraction at [facility]   HIP ARTHROPLASTY     right   INGUINAL HERNIA REPAIR Left 09/06/2019   Procedure: LAPAROSCOPIC LEFT INGUINAL HERNIA WITH MESH;  Surgeon: Tanda Locus, MD;  Location: [facility];  Service: General;  Laterality: Left;   KNEE ARTHROPLASTY     bilateral   LUMBAR LAMINECTOMY/DECOMPRESSION MICRODISCECTOMY  08/31/2011   Procedure: LUMBAR LAMINECTOMY/DECOMPRESSION MICRODISCECTOMY;  Surgeon: Lynwood JONELLE Mill, MD;  Location: [facility];  Service: Neurosurgery;  Laterality: N/A;  LumbarThree to Lumbar Five Laminectomy, possible Lumbar Four-Five Diskectomy   SHOULDER SURGERY Left    Rotator cuff repair   UPPER GI ENDOSCOPY      MEDICATIONS:  albuterol  (PROVENTIL ) (2.5 MG/3ML) 0.083% nebulizer solution   albuterol  (VENTOLIN  HFA) 108 (90 Base) MCG/ACT inhaler   amLODipine (NORVASC) 10 MG tablet   aspirin  EC 81 MG tablet   azithromycin  (ZITHROMAX  Z-PAK) 250 MG tablet   buPROPion (WELLBUTRIN XL) 150 MG 24 hr tablet   cefdinir  (OMNICEF ) 300 MG capsule   Cholecalciferol (VITAMIN D3) 1000 UNITS CAPS   cyanocobalamin (VITAMIN B12) 1000 MCG tablet   empagliflozin  (JARDIANCE ) 25 MG TABS tablet   Ferrous Gluconate (FERGON PO)   fexofenadine (ALLEGRA) 180 MG tablet   fluticasone  (FLONASE ) 50 MCG/ACT nasal spray   fluticasone -salmeterol (WIXELA INHUB) 250-50 MCG/ACT AEPB   gabapentin  (NEURONTIN ) 400 MG capsule   hydrALAZINE (APRESOLINE) 50 MG tablet    hydrOXYzine (ATARAX/VISTARIL) 10 MG tablet   losartan (COZAAR) 100 MG tablet   melatonin 3 MG TABS tablet   metFORMIN  (GLUCOPHAGE ) 500 MG tablet   modafinil  (PROVIGIL ) 100 MG tablet   NON FORMULARY   omeprazole  (PRILOSEC  OTC) 20 MG tablet   PARoxetine  (PAXIL ) 40 MG tablet   Polyethyl Glycol-Propyl Glycol (SYSTANE OP)   pravastatin (PRAVACHOL) 40 MG tablet   QUEtiapine (SEROQUEL) 25 MG tablet   spironolactone (ALDACTONE) 25 MG tablet   tamsulosin (FLOMAX) 0.4 MG CAPS capsule   Tiotropium Bromide Monohydrate  (SPIRIVA  RESPIMAT) 2.5 MCG/ACT AERS   triamcinolone ointment (KENALOG) 0.1 %   Turmeric 400 MG CAPS   vitamin E  400 UNIT capsule   No current  facility-administered medications for this encounter.   Harlene Hoots Ward, PA-C [facility] Pre-Surgical Testing [phone]

## 2024-03-26 ENCOUNTER — Encounter

## 2024-03-26 ENCOUNTER — Ambulatory Visit (AMBULATORY_SURGERY_CENTER)

## 2024-03-26 VITALS — Ht 73.5 in | Wt 232.0 lb

## 2024-03-26 DIAGNOSIS — Z1211 Encounter for screening for malignant neoplasm of colon: Secondary | ICD-10-CM

## 2024-03-26 MED ORDER — PEG 3350-KCL-NA BICARB-NACL 420 G PO SOLR: 4000.0000 mL | Freq: Once | ORAL | 0 refills | Status: AC

## 2024-03-26 MED ORDER — BISACODYL EC 5 MG PO TBEC: 5.0000 mg | DELAYED_RELEASE_TABLET | ORAL | 0 refills | Status: AC

## 2024-03-26 NOTE — Progress Notes (Signed)
 No egg or soy allergy known to patient  No issues known to pt with past sedation with any surgeries or procedures Patient denies ever being told they had issues or difficulty with intubation  No FH of Malignant Hyperthermia Pt is not on diet pills Pt is not on  home 02  Using BiPap at night  Pt is not on blood thinners  Pt denies issues with constipation  No A fib or A flutter Have any cardiac testing pending-- no  LOA: independent  Prep: golytely   Patient's chart reviewed by Norleen Schillings CNRA prior to previsit and patient appropriate for the [facility].  Previsit completed and red dot placed by patient's name on their procedure day (on provider's schedule).     PV completed with patient. Prep instructions sent via mychart and home address.

## 2024-03-28 ENCOUNTER — Ambulatory Visit: Payer: Self-pay | Admitting: Internal Medicine

## 2024-03-28 NOTE — Telephone Encounter (Signed)
 Called and spoke with pt's wife, Jams Trickett [facility]) with results of xray. Pt's wife verbalized understanding. NFN

## 2024-03-30 LAB — TYPE AND SCREEN
ABO/RH(D): O POS
Antibody Screen: NEGATIVE

## 2024-04-04 ENCOUNTER — Encounter (HOSPITAL_COMMUNITY): Payer: Self-pay | Admitting: Orthopaedic Surgery

## 2024-04-04 NOTE — Progress Notes (Signed)
 Updated date of surgery: 04/13/24  Updated time of arrival: 0915 AM  Patient denies any changes in allergies, medications, medical history since pre op appointment on: Completed course of Cefdinir  and restarted Azithromycin   Pre op instructions reviewed, follow up questions addressed and patient verbalized understanding at this time.

## 2024-04-11 ENCOUNTER — Ambulatory Visit: Payer: No Typology Code available for payment source | Admitting: Physician Assistant

## 2024-04-12 ENCOUNTER — Encounter: Admitting: Internal Medicine

## 2024-04-12 ENCOUNTER — Encounter: Admitting: Orthopaedic Surgery

## 2024-04-12 NOTE — H&P (Signed)
 TOTAL HIP ADMISSION H&P  Patient is admitted for left total hip arthroplasty.  Subjective:  Chief Complaint: left hip pain  HPI: [name], 73 y.o. male, has a history of pain and functional disability in the left hip(s) due to arthritis and patient has failed non-surgical conservative treatments for greater than 12 weeks to include NSAID's and/or analgesics, flexibility and strengthening excercises, use of assistive devices, and activity modification.  Onset of symptoms was gradual starting several years ago with gradually worsening course since that time.The patient noted no past surgery on the left hip(s).  Patient currently rates pain in the left hip at 10 out of 10 with activity. Patient has night pain, worsening of pain with activity and weight bearing, trendelenberg gait, pain that interfers with activities of daily living, and pain with passive range of motion. Patient has evidence of subchondral sclerosis, periarticular osteophytes, and joint space narrowing by imaging studies. This condition presents safety issues increasing the risk of falls.  There is no current active infection.  Patient Active Problem List   Diagnosis Date Noted   Acute bronchitis 03/19/2024   Unilateral primary osteoarthritis, left hip 03/07/2024   Pseudophakia 10/18/2022   Generalized anxiety disorder 10/18/2022   GERD (gastroesophageal reflux disease) 10/18/2022   Osteoporosis 10/18/2022   Peripheral neuropathy 10/18/2022   Mild cognitive impairment 10/18/2022   Major depressive disorder    Osteoarthritis    Abnormality of gait    Coronary artery disease    Degeneration of cervical intervertebral disc    Lumbar spondylosis    History of total knee arthroplasty    Hyperlipidemia    Hypothyroidism    Klippel-Feil sequence    Dystrophia unguium    Pain in joints of right hand    Peripheral venous insufficiency    Vitamin D deficiency    Abnormal CT scan of lung 07/23/2021   COPD (chronic  obstructive pulmonary disease) 07/23/2021   Contact with and (suspected) exposure to asbestos 07/23/2021   Dyspnea on exertion    Nonspecific abnormal electrocardiogram (ECG) (EKG)    Bilateral primary osteoarthritis of hip 12/12/2019   Low back pain 12/12/2019   Spinal stenosis of lumbar region 09/14/2018   Obstructive sleep apnea 12/15/2015   Controlled narcolepsy 12/15/2015   Essential (primary) hypertension 11/25/2010   Type 2 diabetes mellitus without complications 11/25/2010   PTSD (post-traumatic stress disorder) 11/25/2010   Degenerative joint disease involving multiple joints 11/25/2010   Allergic rhinitis due to pollen 11/25/2010   Learning disability, unspecified 1997   Past Medical History:  Diagnosis Date   Abnormal CT scan of lung 07/23/2021   Abnormality of gait    Allergic rhinitis due to pollen 11/25/2010   Bilateral primary osteoarthritis of hip 12/12/2019   Carpal tunnel syndrome on right    Chronic kidney disease    Complication of anesthesia    Hard to wake up, has narcolepsy   Contact with and (suspected) exposure to asbestos 07/23/2021   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   Hyperlipidemia    Hypothyroidism  Klippel-Feil sequence    Learning disability, unspecified 1997   Reading/reading comprehension   Left inguinal hernia    Low back pain 12/12/2019   Lumbar spondylosis    Major depressive disorder    Mild cognitive impairment 10/18/2022   Nonspecific abnormal electrocardiogram (ECG) (EKG)     Obstructive sleep apnea 12/15/2015   BiPAP   Osteoarthritis    Osteoporosis    Pain in joints of right hand    Peripheral neuropathy    Peripheral venous insufficiency    Pituitary abnormality    pituitary edema; takes bromocriptine    PTSD (post-traumatic stress disorder) 11/25/2010   Spinal stenosis of lumbar region 09/14/2018   Type 2 diabetes mellitus without complications 11/25/2010   Vitamin D deficiency    Weakness of left leg    Wears glasses     Past Surgical History:  Procedure Laterality Date   CARDIAC CATHETERIZATION  2006   LHC by Dr. Levern ORIN MEDIATE RELEASE Right 10/12/2022   Procedure: Right CARPAL TUNNEL RELEASE;  Surgeon: Gillie Duncans, MD;  Location: [facility] OR;  Service: Neurosurgery;  Laterality: Right;  RM 20   CATARACT EXTRACTION Left 03/23/2024   CERVICAL DISC SURGERY     x 2    Posterior approach on both   COLONOSCOPY     EYE SURGERY Right 10/08/2019   cataract extraction at [facility]   HIP ARTHROPLASTY     right   INGUINAL HERNIA REPAIR Left 09/06/2019   Procedure: LAPAROSCOPIC LEFT INGUINAL HERNIA WITH MESH;  Surgeon: Tanda Locus, MD;  Location: [facility];  Service: General;  Laterality: Left;   KNEE ARTHROPLASTY     bilateral   LUMBAR LAMINECTOMY/DECOMPRESSION MICRODISCECTOMY  08/31/2011   Procedure: LUMBAR LAMINECTOMY/DECOMPRESSION MICRODISCECTOMY;  Surgeon: Lynwood JONELLE Mill, MD;  Location: [facility];  Service: Neurosurgery;  Laterality: N/A;  LumbarThree to Lumbar Five Laminectomy, possible Lumbar Four-Five Diskectomy   SHOULDER SURGERY Left    Rotator cuff repair   UPPER GI ENDOSCOPY      No current facility-administered medications for this encounter.   Current Outpatient Medications  Medication Sig Dispense Refill Last Dose/Taking   albuterol  (PROVENTIL ) (2.5 MG/3ML) 0.083% nebulizer solution Take 3 mLs (2.5 mg total) by nebulization every 4 (four) hours as needed for wheezing or shortness of breath. 1080 mL 2 Taking As Needed    albuterol  (VENTOLIN  HFA) 108 (90 Base) MCG/ACT inhaler Inhale 2 puffs into the lungs every 6 (six) hours as needed for wheezing or shortness of breath. 8 g 5 Taking As Needed   amLODipine  (NORVASC ) 10 MG tablet Take 10 mg by mouth daily.   Taking   aspirin  EC 81 MG tablet Take 81 mg by mouth daily. Swallow whole. (Patient not taking: Reported on 03/26/2024)   Taking   buPROPion  (WELLBUTRIN  XL) 150 MG 24 hr tablet Take 150 mg by mouth daily.   Taking   Cholecalciferol (VITAMIN D3) 1000 UNITS CAPS Take 1,000 Units by mouth every morning. (Patient not taking: Reported on 03/26/2024)   Taking   cyanocobalamin (VITAMIN B12) 1000 MCG tablet Take 1,000 mcg by mouth daily. (Patient not taking: Reported on 03/26/2024)   Taking   empagliflozin  (JARDIANCE ) 25 MG TABS tablet Take 12.5 mg by mouth daily.   Taking   Ferrous Gluconate (FERGON PO) Take 45 mg by mouth daily. (Patient not taking: Reported on 03/26/2024)   Taking   fexofenadine (ALLEGRA) 180 MG tablet Take 180 mg by mouth daily.   Taking   fluticasone  (  FLONASE ) 50 MCG/ACT nasal spray Place 1 spray into both nostrils in the morning and at bedtime.   Taking   fluticasone -salmeterol (WIXELA INHUB) 250-50 MCG/ACT AEPB Inhale 1 puff into the lungs in the morning and at bedtime. 1 each 5 Taking   gabapentin  (NEURONTIN ) 400 MG capsule Take 400 mg by mouth 3 (three) times daily.   Taking   hydrALAZINE  (APRESOLINE ) 50 MG tablet Take 50 mg by mouth 3 (three) times daily.   Taking   hydrOXYzine  (ATARAX /VISTARIL ) 10 MG tablet Take 20 mg by mouth at bedtime.   Taking   losartan  (COZAAR ) 100 MG tablet Take 100 mg by mouth every evening.   Taking   melatonin 3 MG TABS tablet Take 6 mg by mouth at bedtime.   Taking   metFORMIN  (GLUCOPHAGE ) 500 MG tablet Take 500 mg by mouth 2 (two) times daily with a meal.   Taking   modafinil  (PROVIGIL ) 100 MG tablet Take 200 mg (2 tablets) in the morning and 100 mg (one tablet) at noon as directed 90 tablet 5 Taking   omeprazole   (PRILOSEC  OTC) 20 MG tablet Take 1 tablet (20 mg total) by mouth daily. 30 tablet 2 Taking   PARoxetine  (PAXIL ) 40 MG tablet Take 40 mg by mouth at bedtime.   Taking   Polyethyl Glycol-Propyl Glycol (SYSTANE OP) Place 1 drop into both eyes daily as needed (dry eyes).   Taking As Needed   pravastatin  (PRAVACHOL ) 40 MG tablet Take 40 mg by mouth at bedtime.   Taking   QUEtiapine  (SEROQUEL ) 25 MG tablet Take 25 mg by mouth at bedtime.   Taking   spironolactone  (ALDACTONE ) 25 MG tablet Take 50 mg by mouth daily.    Taking   tamsulosin  (FLOMAX ) 0.4 MG CAPS capsule Take 0.4 mg by mouth every morning.   Taking   Tiotropium Bromide  Monohydrate (SPIRIVA  RESPIMAT) 2.5 MCG/ACT AERS Inhale 2 puffs into the lungs daily. 1 g 5 Taking   triamcinolone ointment (KENALOG) 0.1 % Apply 1 application  topically daily as needed (rash).   Taking As Needed   Turmeric 400 MG CAPS Take 400 mg by mouth daily. (Patient not taking: Reported on 03/26/2024)   Taking   vitamin E  400 UNIT capsule Take 400 Units by mouth daily. (Patient not taking: Reported on 03/26/2024)   Taking   azithromycin  (ZITHROMAX  Z-PAK) 250 MG tablet Take 1 tablet (250 mg total) by mouth 3 (three) times a week. 12 tablet 5    bisacodyl  5 MG EC tablet Take 1 tablet (5 mg total) by mouth as directed. 4 tablet 0    cefdinir  (OMNICEF ) 300 MG capsule Take 1 capsule (300 mg total) by mouth 2 (two) times daily. (Patient not taking: Reported on 04/04/2024) 20 capsule 0 Not Taking   NON FORMULARY BIPAP at bedtime      Allergies  Allergen Reactions   Misc. Sulfonamide Containing Compounds Other (See Comments)   Ace Inhibitors Swelling   Sulfa Antibiotics Swelling    Social History   Tobacco Use   Smoking status: Never   Smokeless tobacco: Never  Substance Use Topics   Alcohol  use: No    Family History  Problem Relation Age of Onset   Diabetes Mother    Alzheimer's disease Mother    Depression Father    ALS Father    ALS Brother    Hypertension  Brother    Hypertension Brother    Cancer Brother    Colon cancer Neg Hx  Rectal cancer Neg Hx    Stomach cancer Neg Hx      Review of Systems  Objective:  Physical Exam Vitals reviewed.  Constitutional:      Appearance: Normal appearance.  HENT:     Head: Normocephalic and atraumatic.  Eyes:     Extraocular Movements: Extraocular movements intact.     Pupils: Pupils are equal, round, and reactive to light.  Cardiovascular:     Rate and Rhythm: Normal rate and regular rhythm.  Pulmonary:     Effort: Pulmonary effort is normal.     Breath sounds: Normal breath sounds.  Abdominal:     Palpations: Abdomen is soft.  Musculoskeletal:     Cervical back: Normal range of motion and neck supple.     Left hip: Tenderness and bony tenderness present. Decreased range of motion. Decreased strength.  Neurological:     Mental Status: He is alert and oriented to person, place, and time.  Psychiatric:        Behavior: Behavior normal.     Vital signs in last 24 hours:    Labs:   Estimated body mass index is 30.19 kg/m as calculated from the following:   Height as of 03/26/24: 6' 1.5 (1.867 m).   Weight as of 03/26/24: 105.2 kg.   Imaging Review Plain radiographs demonstrate severe degenerative joint disease of the left hip(s). The bone quality appears to be good for age and reported activity level.      Assessment/Plan:  End stage arthritis, left hip(s)  The patient history, physical examination, clinical judgement of the provider and imaging studies are consistent with end stage degenerative joint disease of the left hip(s) and total hip arthroplasty is deemed medically necessary. The treatment options including medical management, injection therapy, arthroscopy and arthroplasty were discussed at length. The risks and benefits of total hip arthroplasty were presented and reviewed. The risks due to aseptic loosening, infection, stiffness, dislocation/subluxation,   thromboembolic complications and other imponderables were discussed.  The patient acknowledged the explanation, agreed to proceed with the plan and consent was signed. Patient is being admitted for inpatient treatment for surgery, pain control, PT, OT, prophylactic antibiotics, VTE prophylaxis, progressive ambulation and ADL's and discharge planning.The patient is planning to be discharged home with home health services

## 2024-04-13 ENCOUNTER — Ambulatory Visit (HOSPITAL_COMMUNITY): Admitting: Anesthesiology

## 2024-04-13 ENCOUNTER — Ambulatory Visit (HOSPITAL_COMMUNITY): Payer: Self-pay | Admitting: Physician Assistant

## 2024-04-13 ENCOUNTER — Other Ambulatory Visit: Payer: Self-pay

## 2024-04-13 ENCOUNTER — Encounter (HOSPITAL_COMMUNITY): Payer: Self-pay | Admitting: Orthopaedic Surgery

## 2024-04-13 ENCOUNTER — Ambulatory Visit (HOSPITAL_COMMUNITY)

## 2024-04-13 ENCOUNTER — Observation Stay (HOSPITAL_COMMUNITY)

## 2024-04-13 ENCOUNTER — Encounter (HOSPITAL_COMMUNITY)
Admission: RE | Disposition: A | Discharge status: Home / Self Care | Payer: Self-pay | Source: Ambulatory Visit | Attending: Orthopaedic Surgery

## 2024-04-13 ENCOUNTER — Observation Stay (HOSPITAL_COMMUNITY)
Admission: RE | Admit: 2024-04-13 | Discharge: 2024-04-17 | Disposition: A | Discharge status: Home / Self Care | Source: Ambulatory Visit | Attending: Orthopaedic Surgery | Admitting: Orthopaedic Surgery

## 2024-04-13 DIAGNOSIS — N189 Chronic kidney disease, unspecified: Secondary | ICD-10-CM | POA: Diagnosis not present

## 2024-04-13 DIAGNOSIS — E1122 Type 2 diabetes mellitus with diabetic chronic kidney disease: Secondary | ICD-10-CM | POA: Insufficient documentation

## 2024-04-13 DIAGNOSIS — Z7982 Long term (current) use of aspirin: Secondary | ICD-10-CM | POA: Diagnosis not present

## 2024-04-13 DIAGNOSIS — M1612 Unilateral primary osteoarthritis, left hip: Secondary | ICD-10-CM

## 2024-04-13 DIAGNOSIS — J449 Chronic obstructive pulmonary disease, unspecified: Secondary | ICD-10-CM | POA: Insufficient documentation

## 2024-04-13 DIAGNOSIS — Z96642 Presence of left artificial hip joint: Secondary | ICD-10-CM | POA: Diagnosis not present

## 2024-04-13 DIAGNOSIS — I2510 Atherosclerotic heart disease of native coronary artery without angina pectoris: Secondary | ICD-10-CM | POA: Diagnosis not present

## 2024-04-13 DIAGNOSIS — Z01818 Encounter for other preprocedural examination: Principal | ICD-10-CM

## 2024-04-13 DIAGNOSIS — E119 Type 2 diabetes mellitus without complications: Secondary | ICD-10-CM

## 2024-04-13 DIAGNOSIS — E039 Hypothyroidism, unspecified: Secondary | ICD-10-CM | POA: Diagnosis not present

## 2024-04-13 HISTORY — PX: TOTAL HIP ARTHROPLASTY: SHX124

## 2024-04-13 LAB — GLUCOSE, CAPILLARY
Glucose-Capillary: 87 mg/dL (ref 70–99)
Glucose-Capillary: 91 mg/dL (ref 70–99)
Glucose-Capillary: 94 mg/dL (ref 70–99)

## 2024-04-13 LAB — TYPE AND SCREEN
ABO/RH(D): O POS
Antibody Screen: NEGATIVE

## 2024-04-13 SURGERY — ARTHROPLASTY, HIP, TOTAL, ANTERIOR APPROACH
Anesthesia: Spinal | Site: Hip | Laterality: Left

## 2024-04-13 MED ORDER — CEFAZOLIN SODIUM-DEXTROSE 2-4 GM/100ML-% IV SOLN
2.0000 g | Freq: Four times a day (QID) | INTRAVENOUS | Status: AC
  Administered 2024-04-13 (×2): 2 g via INTRAVENOUS
  Filled 2024-04-13 (×2): qty 100

## 2024-04-13 MED ORDER — PROPOFOL 10 MG/ML IV BOLUS
INTRAVENOUS | Status: DC | PRN
  Administered 2024-04-13: 20 mg via INTRAVENOUS

## 2024-04-13 MED ORDER — QUETIAPINE FUMARATE 25 MG PO TABS
25.0000 mg | ORAL_TABLET | Freq: Every day | ORAL | Status: DC
  Administered 2024-04-13 – 2024-04-16 (×4): 25 mg via ORAL
  Filled 2024-04-13 (×4): qty 1

## 2024-04-13 MED ORDER — DEXAMETHASONE SODIUM PHOSPHATE 10 MG/ML IJ SOLN
INTRAMUSCULAR | Status: DC | PRN
  Administered 2024-04-13: 10 mg via INTRAVENOUS

## 2024-04-13 MED ORDER — PROPOFOL 1000 MG/100ML IV EMUL
INTRAVENOUS | Status: AC
  Filled 2024-04-13: qty 100

## 2024-04-13 MED ORDER — DOCUSATE SODIUM 100 MG PO CAPS
100.0000 mg | ORAL_CAPSULE | Freq: Two times a day (BID) | ORAL | Status: DC
  Administered 2024-04-13 – 2024-04-17 (×8): 100 mg via ORAL
  Filled 2024-04-13 (×8): qty 1

## 2024-04-13 MED ORDER — PRAVASTATIN SODIUM 20 MG PO TABS
40.0000 mg | ORAL_TABLET | Freq: Every day | ORAL | Status: DC
Start: 2024-04-13 — End: 2024-04-17
  Administered 2024-04-13 – 2024-04-16 (×4): 40 mg via ORAL
  Filled 2024-04-13 (×4): qty 2

## 2024-04-13 MED ORDER — ACETAMINOPHEN 325 MG PO TABS
325.0000 mg | ORAL_TABLET | Freq: Four times a day (QID) | ORAL | Status: DC | PRN
  Administered 2024-04-17: 650 mg via ORAL
  Filled 2024-04-13: qty 2

## 2024-04-13 MED ORDER — MUPIROCIN 2 % EX OINT: 1.0000 | TOPICAL_OINTMENT | Freq: Two times a day (BID) | CUTANEOUS | 0 refills | Status: AC

## 2024-04-13 MED ORDER — SODIUM CHLORIDE 0.9 % IV SOLN: INTRAVENOUS | Status: DC

## 2024-04-13 MED ORDER — TAMSULOSIN HCL 0.4 MG PO CAPS
0.4000 mg | ORAL_CAPSULE | ORAL | Status: DC
  Administered 2024-04-14 – 2024-04-17 (×4): 0.4 mg via ORAL
  Filled 2024-04-13 (×4): qty 1

## 2024-04-13 MED ORDER — TRANEXAMIC ACID-NACL 1000-0.7 MG/100ML-% IV SOLN
1000.0000 mg | INTRAVENOUS | Status: AC
  Administered 2024-04-13: 1000 mg via INTRAVENOUS
  Filled 2024-04-13: qty 100

## 2024-04-13 MED ORDER — DEXAMETHASONE SODIUM PHOSPHATE 10 MG/ML IJ SOLN
INTRAMUSCULAR | Status: AC
  Filled 2024-04-13: qty 1

## 2024-04-13 MED ORDER — LACTATED RINGERS IV SOLN: INTRAVENOUS | Status: DC

## 2024-04-13 MED ORDER — METHOCARBAMOL 500 MG PO TABS
500.0000 mg | ORAL_TABLET | Freq: Four times a day (QID) | ORAL | Status: DC | PRN
  Administered 2024-04-13 – 2024-04-17 (×6): 500 mg via ORAL
  Filled 2024-04-13 (×6): qty 1

## 2024-04-13 MED ORDER — 0.9 % SODIUM CHLORIDE (POUR BTL) OPTIME
TOPICAL | Status: DC | PRN
  Administered 2024-04-13: 1000 mL

## 2024-04-13 MED ORDER — PANTOPRAZOLE SODIUM 40 MG PO TBEC
40.0000 mg | DELAYED_RELEASE_TABLET | Freq: Every day | ORAL | Status: DC
  Administered 2024-04-13 – 2024-04-17 (×5): 40 mg via ORAL
  Filled 2024-04-13 (×5): qty 1

## 2024-04-13 MED ORDER — OXYCODONE HCL 5 MG PO TABS
5.0000 mg | ORAL_TABLET | ORAL | Status: DC | PRN
  Administered 2024-04-14 – 2024-04-17 (×6): 10 mg via ORAL
  Filled 2024-04-13 (×6): qty 2

## 2024-04-13 MED ORDER — BISACODYL 5 MG PO TBEC
5.0000 mg | DELAYED_RELEASE_TABLET | ORAL | Status: DC
  Administered 2024-04-17: 5 mg via ORAL
  Filled 2024-04-13: qty 1

## 2024-04-13 MED ORDER — CHLORHEXIDINE GLUCONATE 0.12 % MT SOLN
15.0000 mL | Freq: Once | OROMUCOSAL | Status: AC
  Administered 2024-04-13: 15 mL via OROMUCOSAL

## 2024-04-13 MED ORDER — MODAFINIL 200 MG PO TABS
200.0000 mg | ORAL_TABLET | Freq: Every day | ORAL | Status: DC
  Administered 2024-04-14 – 2024-04-17 (×4): 200 mg via ORAL
  Filled 2024-04-13 (×4): qty 1

## 2024-04-13 MED ORDER — ACETAMINOPHEN 500 MG PO TABS
1000.0000 mg | ORAL_TABLET | Freq: Once | ORAL | Status: AC
  Administered 2024-04-13: 1000 mg via ORAL
  Filled 2024-04-13: qty 2

## 2024-04-13 MED ORDER — FENTANYL CITRATE PF 50 MCG/ML IJ SOSY: 25.0000 ug | PREFILLED_SYRINGE | INTRAMUSCULAR | Status: DC | PRN

## 2024-04-13 MED ORDER — ONDANSETRON HCL 4 MG/2ML IJ SOLN
INTRAMUSCULAR | Status: AC
  Filled 2024-04-13: qty 2

## 2024-04-13 MED ORDER — UMECLIDINIUM BROMIDE 62.5 MCG/ACT IN AEPB
1.0000 | INHALATION_SPRAY | Freq: Every day | RESPIRATORY_TRACT | Status: DC
  Administered 2024-04-15 – 2024-04-17 (×3): 1 via RESPIRATORY_TRACT
  Filled 2024-04-13: qty 7

## 2024-04-13 MED ORDER — PHENYLEPHRINE HCL-NACL 20-0.9 MG/250ML-% IV SOLN
INTRAVENOUS | Status: DC | PRN
Start: 2024-04-13 — End: 2024-04-13
  Administered 2024-04-13: 35 ug/min via INTRAVENOUS

## 2024-04-13 MED ORDER — DIPHENHYDRAMINE HCL 12.5 MG/5ML PO ELIX: 12.5000 mg | ORAL_SOLUTION | ORAL | Status: DC | PRN

## 2024-04-13 MED ORDER — POVIDONE-IODINE 10 % EX SWAB
2.0000 | Freq: Once | CUTANEOUS | Status: AC
  Administered 2024-04-13: 2 via TOPICAL

## 2024-04-13 MED ORDER — METOCLOPRAMIDE HCL 5 MG/ML IJ SOLN
5.0000 mg | Freq: Three times a day (TID) | INTRAMUSCULAR | Status: DC | PRN
  Administered 2024-04-14: 10 mg via INTRAVENOUS
  Filled 2024-04-13: qty 2

## 2024-04-13 MED ORDER — SPIRONOLACTONE 25 MG PO TABS
50.0000 mg | ORAL_TABLET | Freq: Every day | ORAL | Status: DC
  Administered 2024-04-14 – 2024-04-17 (×4): 50 mg via ORAL
  Filled 2024-04-13 (×5): qty 2

## 2024-04-13 MED ORDER — PROPOFOL 10 MG/ML IV BOLUS
INTRAVENOUS | Status: AC
  Filled 2024-04-13: qty 20

## 2024-04-13 MED ORDER — METFORMIN HCL 500 MG PO TABS
500.0000 mg | ORAL_TABLET | Freq: Two times a day (BID) | ORAL | Status: DC
  Administered 2024-04-14 – 2024-04-17 (×7): 500 mg via ORAL
  Filled 2024-04-13 (×7): qty 1

## 2024-04-13 MED ORDER — OXYCODONE HCL 5 MG PO TABS
10.0000 mg | ORAL_TABLET | ORAL | Status: DC | PRN
  Administered 2024-04-13 – 2024-04-15 (×3): 10 mg via ORAL
  Filled 2024-04-13 (×3): qty 2

## 2024-04-13 MED ORDER — HYDRALAZINE HCL 50 MG PO TABS
50.0000 mg | ORAL_TABLET | Freq: Three times a day (TID) | ORAL | Status: DC
  Administered 2024-04-13 – 2024-04-17 (×12): 50 mg via ORAL
  Filled 2024-04-13 (×12): qty 1

## 2024-04-13 MED ORDER — MENTHOL 3 MG MT LOZG: 1.0000 | LOZENGE | OROMUCOSAL | Status: DC | PRN

## 2024-04-13 MED ORDER — SODIUM CHLORIDE 0.9 % IR SOLN
Status: DC | PRN
Start: 2024-04-13 — End: 2024-04-13
  Administered 2024-04-13: 1000 mL

## 2024-04-13 MED ORDER — BUPROPION HCL ER (XL) 150 MG PO TB24
150.0000 mg | ORAL_TABLET | Freq: Every day | ORAL | Status: DC
  Administered 2024-04-14 – 2024-04-17 (×4): 150 mg via ORAL
  Filled 2024-04-13 (×4): qty 1

## 2024-04-13 MED ORDER — PAROXETINE HCL 10 MG PO TABS
40.0000 mg | ORAL_TABLET | Freq: Every day | ORAL | Status: DC
  Administered 2024-04-13 – 2024-04-16 (×4): 40 mg via ORAL
  Filled 2024-04-13 (×4): qty 4

## 2024-04-13 MED ORDER — FENTANYL CITRATE PF 50 MCG/ML IJ SOSY: 50.0000 ug | PREFILLED_SYRINGE | Freq: Once | INTRAMUSCULAR | Status: DC

## 2024-04-13 MED ORDER — BUPIVACAINE IN DEXTROSE 0.75-8.25 % IT SOLN
INTRATHECAL | Status: DC | PRN
Start: 2024-04-13 — End: 2024-04-13
  Administered 2024-04-13: 1.7 mL via INTRATHECAL

## 2024-04-13 MED ORDER — INSULIN ASPART 100 UNIT/ML IJ SOLN: 0.0000 [IU] | INTRAMUSCULAR | Status: DC | PRN

## 2024-04-13 MED ORDER — MOMETASONE FURO-FORMOTEROL FUM 200-5 MCG/ACT IN AERO
2.0000 | INHALATION_SPRAY | Freq: Two times a day (BID) | RESPIRATORY_TRACT | Status: DC
  Administered 2024-04-14 – 2024-04-17 (×6): 2 via RESPIRATORY_TRACT
  Filled 2024-04-13: qty 8.8

## 2024-04-13 MED ORDER — VITAMIN E 45 MG (100 UNIT) PO CAPS
400.0000 [IU] | ORAL_CAPSULE | Freq: Every day | ORAL | Status: DC
  Administered 2024-04-14 – 2024-04-17 (×4): 400 [IU] via ORAL
  Filled 2024-04-13 (×4): qty 4

## 2024-04-13 MED ORDER — ONDANSETRON HCL 4 MG/2ML IJ SOLN
4.0000 mg | Freq: Four times a day (QID) | INTRAMUSCULAR | Status: DC | PRN
  Administered 2024-04-14 (×2): 4 mg via INTRAVENOUS
  Filled 2024-04-13 (×2): qty 2

## 2024-04-13 MED ORDER — DROPERIDOL 2.5 MG/ML IJ SOLN: 0.6250 mg | Freq: Once | INTRAMUSCULAR | Status: DC | PRN

## 2024-04-13 MED ORDER — AMLODIPINE BESYLATE 10 MG PO TABS
10.0000 mg | ORAL_TABLET | Freq: Every day | ORAL | Status: DC
  Administered 2024-04-14 – 2024-04-17 (×4): 10 mg via ORAL
  Filled 2024-04-13 (×4): qty 1

## 2024-04-13 MED ORDER — ORAL CARE MOUTH RINSE: 15.0000 mL | Freq: Once | OROMUCOSAL | Status: AC

## 2024-04-13 MED ORDER — CEFAZOLIN SODIUM-DEXTROSE 2-4 GM/100ML-% IV SOLN
2.0000 g | INTRAVENOUS | Status: AC
  Administered 2024-04-13: 2 g via INTRAVENOUS
  Filled 2024-04-13: qty 100

## 2024-04-13 MED ORDER — MODAFINIL 200 MG PO TABS
100.0000 mg | ORAL_TABLET | Freq: Every day | ORAL | Status: DC
  Administered 2024-04-14 – 2024-04-16 (×3): 100 mg via ORAL
  Filled 2024-04-13 (×3): qty 1

## 2024-04-13 MED ORDER — EMPAGLIFLOZIN 25 MG PO TABS
25.0000 mg | ORAL_TABLET | Freq: Every day | ORAL | Status: DC
  Administered 2024-04-14 – 2024-04-17 (×4): 25 mg via ORAL
  Filled 2024-04-13 (×4): qty 1

## 2024-04-13 MED ORDER — CHLORHEXIDINE GLUCONATE 4 % EX SOLN: 1.0000 | CUTANEOUS | 1 refills | Status: AC

## 2024-04-13 MED ORDER — METOCLOPRAMIDE HCL 5 MG PO TABS: 5.0000 mg | ORAL_TABLET | Freq: Three times a day (TID) | ORAL | Status: DC | PRN

## 2024-04-13 MED ORDER — ASPIRIN 81 MG PO CHEW
81.0000 mg | CHEWABLE_TABLET | Freq: Two times a day (BID) | ORAL | Status: DC
  Administered 2024-04-13 – 2024-04-17 (×8): 81 mg via ORAL
  Filled 2024-04-13 (×8): qty 1

## 2024-04-13 MED ORDER — OXYCODONE HCL 5 MG PO TABS: 5.0000 mg | ORAL_TABLET | Freq: Once | ORAL | Status: DC | PRN

## 2024-04-13 MED ORDER — ONDANSETRON HCL 4 MG/2ML IJ SOLN
INTRAMUSCULAR | Status: DC | PRN
  Administered 2024-04-13: 4 mg via INTRAVENOUS

## 2024-04-13 MED ORDER — VITAMIN D3 25 MCG (1000 UNIT) PO TABS
1000.0000 [IU] | ORAL_TABLET | ORAL | Status: DC
  Administered 2024-04-14 – 2024-04-17 (×4): 1000 [IU] via ORAL
  Filled 2024-04-13 (×8): qty 1

## 2024-04-13 MED ORDER — MODAFINIL 200 MG PO TABS: 200.0000 mg | ORAL_TABLET | Freq: Two times a day (BID) | ORAL | Status: DC

## 2024-04-13 MED ORDER — MIDAZOLAM HCL 2 MG/2ML IJ SOLN: 1.0000 mg | Freq: Once | INTRAMUSCULAR | Status: DC

## 2024-04-13 MED ORDER — FENTANYL CITRATE (PF) 100 MCG/2ML IJ SOLN
INTRAMUSCULAR | Status: DC | PRN
  Administered 2024-04-13: 100 ug via INTRAVENOUS

## 2024-04-13 MED ORDER — GABAPENTIN 400 MG PO CAPS
400.0000 mg | ORAL_CAPSULE | Freq: Three times a day (TID) | ORAL | Status: DC
  Administered 2024-04-13 – 2024-04-17 (×12): 400 mg via ORAL
  Filled 2024-04-13 (×13): qty 1

## 2024-04-13 MED ORDER — VITAMIN B-12 1000 MCG PO TABS
1000.0000 ug | ORAL_TABLET | Freq: Every day | ORAL | Status: DC
  Administered 2024-04-14 – 2024-04-17 (×4): 1000 ug via ORAL
  Filled 2024-04-13 (×4): qty 1

## 2024-04-13 MED ORDER — METHOCARBAMOL 1000 MG/10ML IJ SOLN: 500.0000 mg | Freq: Four times a day (QID) | INTRAMUSCULAR | Status: DC | PRN

## 2024-04-13 MED ORDER — HYDROMORPHONE HCL 1 MG/ML IJ SOLN
0.5000 mg | INTRAMUSCULAR | Status: DC | PRN
  Administered 2024-04-13 – 2024-04-14 (×2): 1 mg via INTRAVENOUS
  Filled 2024-04-13 (×2): qty 1

## 2024-04-13 MED ORDER — ALBUTEROL SULFATE (2.5 MG/3ML) 0.083% IN NEBU
2.5000 mg | INHALATION_SOLUTION | RESPIRATORY_TRACT | Status: DC | PRN
  Administered 2024-04-13 – 2024-04-15 (×4): 2.5 mg via RESPIRATORY_TRACT
  Filled 2024-04-13 (×4): qty 3

## 2024-04-13 MED ORDER — ONDANSETRON HCL 4 MG PO TABS: 4.0000 mg | ORAL_TABLET | Freq: Four times a day (QID) | ORAL | Status: DC | PRN

## 2024-04-13 MED ORDER — PROPOFOL 500 MG/50ML IV EMUL
INTRAVENOUS | Status: DC | PRN
  Administered 2024-04-13: 50 ug/kg/min via INTRAVENOUS

## 2024-04-13 MED ORDER — MELATONIN 3 MG PO TABS
6.0000 mg | ORAL_TABLET | Freq: Every day | ORAL | Status: DC
  Administered 2024-04-13 – 2024-04-16 (×4): 6 mg via ORAL
  Filled 2024-04-13 (×4): qty 2

## 2024-04-13 MED ORDER — HYDROXYZINE HCL 10 MG PO TABS
20.0000 mg | ORAL_TABLET | Freq: Every day | ORAL | Status: DC
  Administered 2024-04-13 – 2024-04-16 (×4): 20 mg via ORAL
  Filled 2024-04-13 (×4): qty 2

## 2024-04-13 MED ORDER — FENTANYL CITRATE (PF) 100 MCG/2ML IJ SOLN
INTRAMUSCULAR | Status: AC
  Filled 2024-04-13: qty 2

## 2024-04-13 MED ORDER — LOSARTAN POTASSIUM 50 MG PO TABS
100.0000 mg | ORAL_TABLET | Freq: Every evening | ORAL | Status: DC
  Administered 2024-04-14 – 2024-04-16 (×3): 100 mg via ORAL
  Filled 2024-04-13 (×3): qty 2

## 2024-04-13 MED ORDER — STERILE WATER FOR IRRIGATION IR SOLN
Status: DC | PRN
  Administered 2024-04-13: 2000 mL

## 2024-04-13 MED ORDER — OXYCODONE HCL 5 MG/5ML PO SOLN: 5.0000 mg | Freq: Once | ORAL | Status: DC | PRN

## 2024-04-13 MED ORDER — TIOTROPIUM BROMIDE MONOHYDRATE 2.5 MCG/ACT IN AERS: 2.0000 | INHALATION_SPRAY | Freq: Every day | RESPIRATORY_TRACT | Status: DC

## 2024-04-13 MED ORDER — ALUM & MAG HYDROXIDE-SIMETH 200-200-20 MG/5ML PO SUSP
30.0000 mL | ORAL | Status: DC | PRN
  Administered 2024-04-14: 30 mL via ORAL
  Filled 2024-04-13: qty 30

## 2024-04-13 MED ORDER — PHENOL 1.4 % MT LIQD
1.0000 | OROMUCOSAL | Status: DC | PRN
Start: 2024-04-13 — End: 2024-04-17

## 2024-04-13 SURGICAL SUPPLY — 37 items
BAG COUNTER SPONGE SURGICOUNT (BAG) ×1 IMPLANT
BAG ZIPLOCK 12X15 (MISCELLANEOUS) ×1 IMPLANT
BENZOIN TINCTURE PRP APPL 2/3 (GAUZE/BANDAGES/DRESSINGS) IMPLANT
BLADE SAW SGTL 18X1.27X75 (BLADE) ×1 IMPLANT
COVER PERINEAL POST (MISCELLANEOUS) ×1 IMPLANT
COVER SURGICAL LIGHT HANDLE (MISCELLANEOUS) ×1 IMPLANT
CUP ACET PNNCL SECTR W/GRIP 56 (Hips) IMPLANT
DRAPE FOOT SWITCH (DRAPES) ×1 IMPLANT
DRAPE STERI IOBAN 125X83 (DRAPES) ×1 IMPLANT
DRAPE U-SHAPE 47X51 STRL (DRAPES) ×2 IMPLANT
DRSG AQUACEL AG ADV 3.5X10 (GAUZE/BANDAGES/DRESSINGS) ×1 IMPLANT
DURAPREP 26ML APPLICATOR (WOUND CARE) ×1 IMPLANT
ELECT PENCIL ROCKER SW 15FT (MISCELLANEOUS) ×1 IMPLANT
ELECT REM PT RETURN 15FT ADLT (MISCELLANEOUS) ×1 IMPLANT
GAUZE XEROFORM 1X8 LF (GAUZE/BANDAGES/DRESSINGS) IMPLANT
GLOVE BIO SURGEON STRL SZ7.5 (GLOVE) ×1 IMPLANT
GLOVE BIOGEL PI IND STRL 8 (GLOVE) ×2 IMPLANT
GLOVE ECLIPSE 8.0 STRL XLNG CF (GLOVE) ×1 IMPLANT
GOWN STRL REUS W/ TWL XL LVL3 (GOWN DISPOSABLE) ×2 IMPLANT
HEAD ARTICULEZE (Hips) IMPLANT
HEAD M SROM 36MM PLUS 1.5 (Hips) IMPLANT
HOLDER FOLEY CATH W/STRAP (MISCELLANEOUS) ×1 IMPLANT
KIT TURNOVER KIT A (KITS) ×1 IMPLANT
PACK ANTERIOR HIP CUSTOM (KITS) ×1 IMPLANT
PINNACLE ALTRX PLUS 4 N 36X56 (Hips) IMPLANT
SET HNDPC FAN SPRY TIP SCT (DISPOSABLE) ×1 IMPLANT
STAPLER SKIN PROX 35W (STAPLE) IMPLANT
STEM FEMORAL SZ5 HIGH ACTIS (Stem) IMPLANT
STRIP CLOSURE SKIN 1/2X4 (GAUZE/BANDAGES/DRESSINGS) IMPLANT
SUT ETHIBOND NAB CT1 #1 30IN (SUTURE) ×1 IMPLANT
SUT ETHILON 2 0 PS N (SUTURE) IMPLANT
SUT MNCRL AB 4-0 PS2 18 (SUTURE) IMPLANT
SUT VIC AB 0 CT1 36 (SUTURE) ×1 IMPLANT
SUT VIC AB 1 CT1 36 (SUTURE) ×1 IMPLANT
SUT VIC AB 2-0 CT1 TAPERPNT 27 (SUTURE) ×2 IMPLANT
TRAY FOLEY MTR SLVR 16FR STAT (SET/KITS/TRAYS/PACK) ×1 IMPLANT
YANKAUER SUCT BULB TIP NO VENT (SUCTIONS) ×1 IMPLANT

## 2024-04-13 NOTE — Op Note (Signed)
 Operative Note  Date of operation: 04/13/2024 Preoperative diagnosis: Left hip primary osteoarthritis Postoperative diagnosis: Same  Procedure: Left direct anterior total hip arthroplasty  Implants: Implant Name Type Inv. Item Serial No. Manufacturer Lot No. LRB No. Used Action  CUP ACET PNNCL SECTR W/GRIP 56 - [id number] Hips CUP ACET PNNCL SECTR W/GRIP 56  [employer] [id number] Left 1 Implanted  PINNACLE ALTRX PLUS 4 N [id number] - [id number] Hips PINNACLE ALTRX PLUS 4 N [id number]  [employer] [id number] Left 1 Implanted  STEM FEMORAL SZ5 HIGH ACTIS - [id number] Stem STEM FEMORAL SZ5 HIGH ACTIS  [employer] [id number] Left 1 Implanted  HEAD M SROM PLUS 1.5 - [id number] Hips HEAD M SROM PLUS 1.5  [employer] [id number] Left 1 Implanted   Surgeon: Lonni GRADE. Vernetta, MD Assistant: Tory Gaskins, PA-C  Anesthesia: Spinal EBL: 350 cc Antibiotics: IV Ancef  Complications: None  Indications: The patient is a 73 year old gentleman with debilitating arthritis involving his left hip.  This is been seen on clinical exam and x-ray findings.  His left hip is severe on a daily basis and it is detrimentally affecting his mobility, his quality of life and his actives daily living to the point he does wish to proceed with a hip replacement on his left hip.  He has remote history of a right hip replacement and both knees being replaced secondary all to arthritis.  With having surgery he is acutely aware of the risks of acute blood loss anemia, nerve and vessel injury, fracture, infection, DVT, dislocation, implant failure, leg length differences and wound healing issues.  He understands that our goals are hopefully decreased pain, improved mobility and improve quality of life.  Procedure description: After informed consent was obtained and the appropriate left hip was marked, the patient is brought to the operating room and set up on the stretcher where spinal anesthesia was obtained.   He was then laid in supine position on the stretcher and a Foley catheter was placed.  Next traction boots were placed on both his feet and he was placed supine on the Hana fracture table with a perineal post in place and both legs in inline skeletal traction devices no traction applied.  His left operative hip and pelvis were assessed radiographically.  The left hip was prepped and draped with DuraPrep and sterile drapes.  A timeout was called and he was identified as the correct patient and the correct left hip.  An incision was then made just inferior and posterior to the ASIS and carried slightly obliquely down the leg.  Dissection was carried down to the tensor fascia lata muscle and tensor fascia was divided longitudinally to proceed with a direct interposed the hip.  Circumflex vessels were identified and cauterized.  The hip capsule identified and opened up in L-type format finding a moderate joint effusion.  Cobra retractors were placed around the medial and lateral femoral neck and a femoral neck cut was made with an oscillating saw just proximal to the lesser trochanter and this cut was completed with an osteotome.  A corkscrew guide was placed in the femoral head and the femoral head was removed in its entirety finding a wide area devoid of cartilage.  A bent Hohmann was then placed over the medial acetabular rim and remnants of the acetabular labrum and para-articular osteophytes removed.  Reaming was initiated from a size 43 reamer and stepwise increments going up to a size 55 reamer with all reamers placed under direct visualization and the  last reamer placed under direct fluoroscopy in order to obtain the depth of reaming the inclination and the anteversion.  The real DePuy sector GRIPTION acetabular component size 56 was then placed without difficulty followed by a 36+4 polyethylene liner.  Attention was then turned to the femur.  With the left leg externally rotated to 120 degrees, extended and  adducted, a Mueller retractor was placed medially and a Hohmann tractor by the greater trochanter.  The lateral joint capsule was released and a box cutting osteotome was used in the femoral canal.  Broaching was then initiated using the Actis broaching system from a size 0 going up to a size 5.  With a size 5 trial broach in place we trialed a high offset femoral neck and a 36+1.5 trial head ball.  The left leg was brought over and up and with traction and internal rotation reduced in the pelvis.  Based on radiographic assessment we felt like we needed a little bit more leg length.  We did note that he had a posterior hip surgery on the right side where they made him a lot longer he never dislocated in spite of a vertical appearing hip cup on that side.  We removed all transportation and placed the real Actis femoral component with high offset size 5 and well with a 36+5 metal head ball.  However due to significant tightness we were unable to reduce as in the pelvis so we removed that +5 head ball and went with a 36+1.5 metal head ball.  This was reduced in pelvis and it was very tight on rotation and stable on exam.  His offset and leg lengths were improved.  The soft tissue was then irrigated with normal saline solution.  Remnants of the joint capsule were closed with interrupted #1 Ethibond suture followed by normal Vicryl close tensor fascia.  0 Vicryl was used to close deep tissue and 2-0 Vicryl was used to close subcu tissue.  The skin was closed with staples.  An Aquacel dressing was applied.  The patient was taken off the Hana table and taken to recovery room.  Tory Gaskins, PA-C did assist  during the entire case and his assistance was crucial and medically necessary for soft tissue management and retraction, helping guide implant placement and a layered closure of the wound.

## 2024-04-13 NOTE — Interval H&P Note (Signed)
 History and Physical Interval Note: The patient understands that he is here today for a left total hip replacement to treat his significant left hip pain and arthritis.  There has been no acute or interval change in his medical status.  The risks and benefits of surgery have been discussed in detail and informed consent has been obtained.  The left operative hip has been marked.  04/13/2024 9:54 AM  [name]  has presented today for surgery, with the diagnosis of OSTEOARTHRITIS LEFT HIP.  The various methods of treatment have been discussed with the patient and family. After consideration of risks, benefits and other options for treatment, the patient has consented to  Procedure(s): ARTHROPLASTY, HIP, TOTAL, ANTERIOR APPROACH (Left) as a surgical intervention.  The patient's history has been reviewed, patient examined, no change in status, stable for surgery.  I have reviewed the patient's chart and labs.  Questions were answered to the patient's satisfaction.     [name]

## 2024-04-13 NOTE — Anesthesia Postprocedure Evaluation (Signed)
 Anesthesia Post Note  Patient: [name]  Procedure(s) Performed: ARTHROPLASTY, HIP, TOTAL, ANTERIOR APPROACH (Left: Hip)     Patient location during evaluation: PACU Anesthesia Type: Spinal Level of consciousness: awake and alert Pain management: pain level controlled Vital Signs Assessment: post-procedure vital signs reviewed and stable Respiratory status: spontaneous breathing, nonlabored ventilation and respiratory function stable Cardiovascular status: blood pressure returned to baseline Postop Assessment: no apparent nausea or vomiting, spinal receding, no headache and no backache Anesthetic complications: no   No notable events documented.  Last Vitals:  Vitals:   04/13/24 1415 04/13/24 1430  BP: (!) 141/68 (!) 147/69  Pulse: (!) 55 (!) 58  Resp: 14 14  Temp:    SpO2: 92% 94%    Last Pain:  Vitals:   04/13/24 1415  TempSrc:   PainSc: 0-No pain                 Vertell Row

## 2024-04-13 NOTE — Anesthesia Preprocedure Evaluation (Addendum)
 Anesthesia Evaluation  Patient identified by MRN, date of birth, ID band Patient awake    Reviewed: Allergy & Precautions, NPO status , Patient's Chart, lab work & pertinent test results  History of Anesthesia Complications Negative for: history of anesthetic complications  Airway Mallampati: III  TM Distance: >3 FB Neck ROM: Full    Dental  (+) Edentulous Upper, Missing, Partial Lower,    Pulmonary sleep apnea and Continuous Positive Airway Pressure Ventilation , COPD,  COPD inhaler   Pulmonary exam normal        Cardiovascular hypertension, Pt. on medications + CAD  Normal cardiovascular exam     Neuro/Psych   Anxiety Depression    narcolepsy    GI/Hepatic Neg liver ROS,GERD  Medicated and Controlled,,  Endo/Other  diabetes, Type 2, Oral Hypoglycemic AgentsHypothyroidism    Renal/GU negative Renal ROS     Musculoskeletal  (+) Arthritis ,    Abdominal   Peds  Hematology negative hematology ROS (+)   Anesthesia Other Findings Day of surgery medications reviewed with patient.  Reproductive/Obstetrics                              Anesthesia Physical Anesthesia Plan  ASA: 2  Anesthesia Plan: Spinal   Post-op Pain Management: Tylenol  PO (pre-op)*   Induction:   PONV Risk Score and Plan: 2 and Treatment may vary due to age or medical condition, Ondansetron , Propofol  infusion and Dexamethasone   Airway Management Planned: Natural Airway and Simple Face Mask  Additional Equipment: None  Intra-op Plan:   Post-operative Plan:   Informed Consent: I have reviewed the patients History and Physical, chart, labs and discussed the procedure including the risks, benefits and alternatives for the proposed anesthesia with the patient or authorized representative who has indicated his/her understanding and acceptance.       Plan Discussed with: CRNA  Anesthesia Plan Comments:           Anesthesia Quick Evaluation

## 2024-04-13 NOTE — Transfer of Care (Signed)
 Immediate Anesthesia Transfer of Care Note  Patient: [name]  Procedure(s) Performed: ARTHROPLASTY, HIP, TOTAL, ANTERIOR APPROACH (Left: Hip)  Patient Location: PACU  Anesthesia Type:spinal  Level of Consciousness: drowsy  Airway & Oxygen Therapy: Patient Spontanous Breathing and Patient connected to face mask oxygen  Post-op Assessment: Report given to RN and Post -op Vital signs reviewed and stable  Post vital signs: Reviewed and stable  Last Vitals:  Vitals Value Taken Time  BP 129/62 04/13/24 13:16  Temp    Pulse 66 04/13/24 13:18  Resp 12 04/13/24 13:18  SpO2 100 % 04/13/24 13:18  Vitals shown include unfiled device data.  Last Pain:  Vitals:   04/13/24 1012  TempSrc:   PainSc: 0-No pain         Complications: No notable events documented.

## 2024-04-13 NOTE — Plan of Care (Signed)

## 2024-04-13 NOTE — Anesthesia Procedure Notes (Signed)
 Spinal  Patient location during procedure: OR Start time: 04/13/2024 11:30 AM End time: 04/13/2024 11:33 AM Reason for block: surgical anesthesia Staffing Performed: anesthesiologist  Anesthesiologist: Paul Lamarr BRAVO, MD Performed by: Paul Lamarr BRAVO, MD Authorized by: Paul Lamarr BRAVO, MD   Preanesthetic Checklist Completed: patient identified, IV checked, risks and benefits discussed, surgical consent, monitors and equipment checked, pre-op evaluation and timeout performed Spinal Block Patient position: sitting Prep: DuraPrep and site prepped and draped Patient monitoring: continuous pulse ox, blood pressure and heart rate Approach: midline Location: L3-4 Injection technique: single-shot Needle Needle type: Pencan  Needle gauge: 24 G Needle length: 9 cm Assessment Events: CSF return Additional Notes Risks, benefits, and alternative discussed. Patient gave consent to procedure. Prepped and draped in sitting position. Patient sedated but responsive to voice. Clear CSF obtained after one needle pass. Positive terminal aspiration. No pain or paraesthesias with injection. Patient tolerated procedure well. Vital signs stable. LANEY Paul, MD

## 2024-04-13 NOTE — Evaluation (Signed)
 Physical Therapy Evaluation Patient Details Name: [name] MRN: [id number] DOB: 04/29/1951 Today's Date: 04/13/2024  History of Present Illness  73 yo male presents to therapy s/p L THA, anterior approach on 04/13/2024 due to failure of conservative measures. Pt PMH includes but is not limited to: CAD, GERD, OP, mild cognitive impairment, CAD, HLD, hypothyroidism, Klippel-Feil sequence, chronic pain, COPD, DOE, OSA, DM II, HTN, DJD, PTSD, peripheral neuropathy, cervical and lumbar surgery and B TKA and R THA.  Clinical Impression    [name] is a 73 y.o. male POD 0 s/p L THA. Patient reports IND with mobility at baseline. Patient is now limited by functional impairments (see PT problem list below) and requires min A for sit to supine and mod A for supine to sit for bed mobility and CGA for transfers. Patient was able to ambulate 35 feet with RW and CGA level of assist. Patient instructed in exercise to facilitate ROM and circulation to manage edema.  Patient will benefit from continued skilled PT interventions to address impairments and progress towards PLOF. Acute PT will follow to progress mobility and stair training in preparation for safe discharge home with family support and [facility] services.       If plan is discharge home, recommend the following: A little help with walking and/or transfers;A little help with bathing/dressing/bathroom;Assistance with feeding;Assist for transportation;Help with stairs or ramp for entrance   Can travel by private vehicle        Equipment Recommendations Rolling walker (2 wheels)  Recommendations for Other Services       Functional Status Assessment Patient has had a recent decline in their functional status and demonstrates the ability to make significant improvements in function in a reasonable and predictable amount of time.     Precautions / Restrictions Precautions Precautions: Fall Restrictions Weight Bearing Restrictions Per  Provider Order: No      Mobility  Bed Mobility Overal bed mobility: Needs Assistance Bed Mobility: Supine to Sit, Sit to Supine     Supine to sit: HOB elevated, Min assist Sit to supine: Mod assist   General bed mobility comments: cues and A for L LE to EOB, to return to bed mod A for B LE    Transfers Overall transfer level: Needs assistance Equipment used: Rolling walker (2 wheels) Transfers: Sit to/from Stand Sit to Stand: Contact guard assist           General transfer comment: min cues    Ambulation/Gait Ambulation/Gait assistance: Contact guard assist Gait Distance (Feet): 35 Feet Assistive device: Rolling walker (2 wheels) Gait Pattern/deviations: Step-to pattern, Decreased stance time - left, Antalgic, Trunk flexed Gait velocity: decreased     General Gait Details: slight trunk flexion with B UE support at RW to offload L LE in stance phase, min cues for posture, proper distance from [employer]            Wheelchair Mobility     Tilt Bed    Modified Rankin (Stroke Patients Only)       Balance Overall balance assessment: Needs assistance Sitting-balance support: Feet supported Sitting balance-Leahy Scale: Good     Standing balance support: Bilateral upper extremity supported, During functional activity, Reliant on assistive device for balance Standing balance-Leahy Scale: Poor                               Pertinent Vitals/Pain Pain Assessment Pain Assessment: 0-10 Pain  Score: 9  Pain Location: L hip and LE Pain Descriptors / Indicators: Aching, Constant, Discomfort, Dull, Moaning, Operative site guarding Pain Intervention(s): Limited activity within patient's tolerance, Monitored during session, Repositioned, Patient requesting pain meds-RN notified, Premedicated before session, Ice applied, RN gave pain meds during session    Home Living Family/patient expects to be discharged to:: Private residence Living Arrangements:  Spouse/significant other Available Help at Discharge: Family Type of Home: House Home Access: Level entry       Home Layout: One level Home Equipment: Rollator (4 wheels);Cane - single point      Prior Function Prior Level of Function : Independent/Modified Independent             Mobility Comments: IND no AD for all ADLs, self care tasks and IADLs.       Extremity/Trunk Assessment        Lower Extremity Assessment Lower Extremity Assessment: LLE deficits/detail LLE Deficits / Details: ankle DF/PF 5/5 LLE Sensation: decreased light touch    Cervical / Trunk Assessment Cervical / Trunk Assessment: Back Surgery;Neck Surgery  Communication   Communication Communication: No apparent difficulties    Cognition Arousal: Alert Behavior During Therapy: WFL for tasks assessed/performed   PT - Cognitive impairments: No apparent impairments                         Following commands: Intact       Cueing       General Comments      Exercises Total Joint Exercises Ankle Circles/Pumps: AROM, Both, 5 reps   Assessment/Plan    PT Assessment Patient needs continued PT services  PT Problem List Decreased strength;Decreased range of motion;Decreased activity tolerance;Decreased balance;Decreased mobility;Decreased coordination;Pain       PT Treatment Interventions DME instruction;Gait training;Functional mobility training;Therapeutic activities;Therapeutic exercise;Balance training;Neuromuscular re-education;Patient/family education;Modalities    PT Goals (Current goals can be found in the Care Plan section)  Acute Rehab PT Goals Patient Stated Goal: to be able to get back to yard work PT Goal Formulation: With patient Time For Goal Achievement: 04/27/24 Potential to Achieve Goals: Good    Frequency 7X/week     Co-evaluation               AM-PAC PT 6 Clicks Mobility  Outcome Measure Help needed turning from your back to your side while  in a flat bed without using bedrails?: None Help needed moving from lying on your back to sitting on the side of a flat bed without using bedrails?: A Little Help needed moving to and from a bed to a chair (including a wheelchair)?: A Little Help needed standing up from a chair using your arms (e.g., wheelchair or bedside chair)?: A Little Help needed to walk in hospital room?: A Little Help needed climbing 3-5 steps with a railing? : Total 6 Click Score: 17    End of Session         PT Visit Diagnosis: Unsteadiness on feet (R26.81);Other abnormalities of gait and mobility (R26.89);Muscle weakness (generalized) (M62.81);Difficulty in walking, not elsewhere classified (R26.2);Pain Pain - Right/Left: Left Pain - part of body: Hip;Leg    Time: 8176-8150 PT Time Calculation (min) (ACUTE ONLY): 26 min   Charges:   PT Evaluation $PT Eval Low Complexity: 1 Low PT Treatments $Gait Training: 8-22 mins PT General Charges $$ ACUTE PT VISIT: 1 Visit         [place], PT Acute Rehab   [place] VEAR Drone 04/13/2024,  7:29 PM

## 2024-04-14 DIAGNOSIS — M1612 Unilateral primary osteoarthritis, left hip: Secondary | ICD-10-CM | POA: Diagnosis not present

## 2024-04-14 LAB — GLUCOSE, CAPILLARY
Glucose-Capillary: 117 mg/dL — ABNORMAL HIGH (ref 70–99)
Glucose-Capillary: 111 mg/dL — ABNORMAL HIGH (ref 70–99)
Glucose-Capillary: 152 mg/dL — ABNORMAL HIGH (ref 70–99)
Glucose-Capillary: 107 mg/dL — ABNORMAL HIGH (ref 70–99)

## 2024-04-14 LAB — CBC
HCT: 34.8 % — ABNORMAL LOW (ref 39.0–52.0)
Hemoglobin: 10.9 g/dL — ABNORMAL LOW (ref 13.0–17.0)
MCH: 23.6 pg — ABNORMAL LOW (ref 26.0–34.0)
MCHC: 31.3 g/dL (ref 30.0–36.0)
MCV: 75.5 fL — ABNORMAL LOW (ref 80.0–100.0)
Platelets: 249 K/uL (ref 150–400)
RBC: 4.61 MIL/uL (ref 4.22–5.81)
RDW: 15.4 % (ref 11.5–15.5)
WBC: 10.7 K/uL — ABNORMAL HIGH (ref 4.0–10.5)
nRBC: 0 % (ref 0.0–0.2)

## 2024-04-14 LAB — BASIC METABOLIC PANEL WITH GFR
Anion gap: 11 (ref 5–15)
BUN: 26 mg/dL — ABNORMAL HIGH (ref 8–23)
CO2: 23 mmol/L (ref 22–32)
Calcium: 8.7 mg/dL — ABNORMAL LOW (ref 8.9–10.3)
Chloride: 102 mmol/L (ref 98–111)
Creatinine, Ser: 1.71 mg/dL — ABNORMAL HIGH (ref 0.61–1.24)
GFR, Estimated: 42 mL/min — ABNORMAL LOW
Glucose, Bld: 128 mg/dL — ABNORMAL HIGH (ref 70–99)
Potassium: 4.7 mmol/L (ref 3.5–5.1)
Sodium: 136 mmol/L (ref 135–145)

## 2024-04-14 MED ORDER — ONDANSETRON 4 MG PO TBDP: 4.0000 mg | ORAL_TABLET | Freq: Three times a day (TID) | ORAL | 0 refills | Status: AC | PRN

## 2024-04-14 MED ORDER — OXYCODONE HCL 5 MG PO TABS: 5.0000 mg | ORAL_TABLET | Freq: Four times a day (QID) | ORAL | 0 refills | Status: DC | PRN

## 2024-04-14 MED ORDER — METHOCARBAMOL 500 MG PO TABS: 500.0000 mg | ORAL_TABLET | Freq: Four times a day (QID) | ORAL | 1 refills | Status: DC | PRN

## 2024-04-14 MED ORDER — ASPIRIN 81 MG PO CHEW: 81.0000 mg | CHEWABLE_TABLET | Freq: Two times a day (BID) | ORAL | 0 refills | Status: DC

## 2024-04-14 MED ORDER — HYDROCODONE-ACETAMINOPHEN 5-325 MG PO TABS
1.0000 | ORAL_TABLET | ORAL | Status: DC | PRN
  Administered 2024-04-14: 2 via ORAL
  Administered 2024-04-14: 1 via ORAL
  Administered 2024-04-15 (×2): 2 via ORAL
  Filled 2024-04-14: qty 2
  Filled 2024-04-14: qty 1
  Filled 2024-04-14 (×2): qty 2

## 2024-04-14 NOTE — Progress Notes (Signed)
   04/14/24 1944  BiPAP/CPAP/SIPAP  BiPAP/CPAP/SIPAP Pt Type Adult  BiPAP/CPAP/SIPAP Resmed  Mask Type Full face mask  Patient Home Machine Yes  Safety Check Completed by RT for Home Unit Yes, no issues noted  Patient Home Mask Yes  Patient Home Tubing Yes  Auto Titrate  (home settings)

## 2024-04-14 NOTE — TOC Transition Note (Signed)
 Transition of Care [facility]) - Discharge Note   Patient Details  Name: [name] MRN: [id number] Date of Birth: [birth year]  [facility]) CM/SW Contact:  Sheri ONEIDA Sharps, LCSW Phone Number: 04/14/2024, 4:37 PM   Clinical Narrative:    Pt HHPT setup w/ [facility]. RW ordered through [employer] to be delivered to pt room prior to dc.    Final next level of care: Home w Home Health Services Barriers to Discharge: No Barriers Identified   Patient Goals and CMS Choice Patient states their goals for this hospitalization and ongoing recovery are:: return home   Choice offered to / list presented to : NA      Discharge Placement                       Discharge Plan and Services Additional resources added to the After Visit Summary for                  DME Arranged: Walker rolling DME Agency: [employer] Date DME Agency Contacted: 04/14/24 Time DME Agency Contacted: [phone] Representative spoke with at DME Agency: [place]            Social Drivers of Health (SDOH) Interventions SDOH Screenings   Food Insecurity: No Food Insecurity (04/13/2024)  Housing: Low Risk  (04/13/2024)  Transportation Needs: No Transportation Needs (04/13/2024)  Utilities: Not At Risk (04/13/2024)  Depression (PHQ2-9): Medium Risk (04/26/2023)  Social Connections: Socially Integrated (04/13/2024)  Tobacco Use: Low Risk  (04/13/2024)     Readmission Risk Interventions     No data to display

## 2024-04-14 NOTE — Discharge Instructions (Signed)

## 2024-04-14 NOTE — Progress Notes (Addendum)
 PT TX NOTE--pm session  04/14/24 1543  PT Visit Information  Last PT Received On 04/14/24  Assistance Needed Pt response to mobility same as am session.  Pt requiring incr time and incr assist  for supine <>sit d/t pain, became dizzy in sitting, nauseous. Assisted with STS and lateral steps along EOB --unable to progress mobility further d/t these reasons and assisted pt back to bed, VSS with BP 160/64 in supine, SpO2=100% on RA, HR 66--RN present with VS check. Continue to follow and progress as pt is able   History of Present Illness 73 yo male presents to therapy s/p L THA, anterior approach on 04/13/2024 due to failure of conservative measures. Pt PMH includes but is not limited to: CAD, GERD, OP, mild cognitive impairment, CAD, HLD, hypothyroidism, Klippel-Feil sequence, chronic pain, COPD, DOE, OSA, DM II, HTN, DJD, PTSD, peripheral neuropathy, cervical and lumbar surgery and B TKA and R THA.  Subjective Data  Patient Stated Goal to be able to get back to yard work  Precautions  Precautions Fall  Restrictions  Weight Bearing Restrictions Per Provider Order No  Pain Assessment  Pain Assessment Faces  Pain Location L hip and LE  Pain Descriptors / Indicators Aching;Constant;Moaning;Operative site guarding;Grimacing;Guarding;Sore  Pain Intervention(s) Limited activity within patient's tolerance;Monitored during session;Repositioned (RN aware of pain level)  Cognition  Arousal Alert  Behavior During Therapy WFL for tasks assessed/performed  PT - Cognitive impairments No apparent impairments  Following Commands  Following commands Intact  Communication  Communication No apparent difficulties  Bed Mobility  Overal bed mobility Needs Assistance  Bed Mobility Supine to Sit;Sit to Supine  Supine to sit HOB elevated;Min assist;Mod assist  Sit to supine Mod assist;Max assist;+2 for physical assistance;+2 for safety/equipment  General bed mobility comments cues and A for L LE to EOB, to  return to bed mod A for B LE  Transfers  Overall transfer level Needs assistance  Equipment used Rolling walker (2 wheels)  Transfers Sit to/from Stand  Sit to Stand Min assist;From elevated surface  General transfer comment cues for hand placement and LLE position. incr time needed - limtied by dizziness, nausea and pain  Ambulation/Gait  General Gait Details lateral steps along  EOB only d/t dizziness, nausea and pain  Balance  Overall balance assessment Needs assistance  Sitting-balance support Feet supported  Sitting balance-Leahy Scale Fair  Sitting balance - Comments heavy reliance on UEs, difficulty, incr time needed to sit fully upright d/t pain- leanign to R to offload L hip  Standing balance support Bilateral upper extremity supported;During functional activity;Reliant on assistive device for balance  Standing balance-Leahy Scale Poor  Exercises  Exercises Total Joint  Total Joint Exercises  Ankle Circles/Pumps AROM;Both;5 reps  Heel Slides AAROM;Left (8 reps)   PT - Assessment/Plan  PT Visit Diagnosis Unsteadiness on feet (R26.81);Other abnormalities of gait and mobility (R26.89);Muscle weakness (generalized) (M62.81);Difficulty in walking, not elsewhere classified (R26.2);Pain  Pain - Right/Left Left  Pain - part of body Hip;Leg  PT Frequency (ACUTE ONLY) 7X/week  Follow Up Recommendations Follow physician's recommendations for discharge plan and follow up therapies  Patient can return home with the following A little help with walking and/or transfers;A little help with bathing/dressing/bathroom;Assistance with feeding;Assist for transportation;Help with stairs or ramp for entrance  PT equipment Rolling walker (2 wheels)  AM-PAC PT 6 Clicks Mobility Outcome Measure (Version 2)  Help needed turning from your back to your side while in a flat bed without using bedrails? 4  Help  needed moving from lying on your back to sitting on the side of a flat bed without using  bedrails? 2  Help needed moving to and from a bed to a chair (including a wheelchair)? 2  Help needed standing up from a chair using your arms (e.g., wheelchair or bedside chair)? 2  Help needed to walk in hospital room? 2  Help needed climbing 3-5 steps with a railing?  1  6 Click Score 13  Consider Recommendation of Discharge To: CIR/SNF/LTACH  Progressive Mobility  What is the highest level of mobility based on the mobility assessment? Level 3 (Stands with assistance) - Balance while standing  and cannot march in place  Activity Turned to right side  PT Goal Progression  Progress towards PT goals Progressing toward goals  Acute Rehab PT Goals  PT Goal Formulation With patient  Time For Goal Achievement 04/27/24  Potential to Achieve Goals Good  PT Time Calculation  PT Start Time (ACUTE ONLY) 1458  PT Stop Time (ACUTE ONLY) 1522  PT Time Calculation (min) (ACUTE ONLY) 24 min  PT General Charges  $$ ACUTE PT VISIT 1 Visit  PT Treatments  $Therapeutic Activity 23-37 mins  [place], PT  Acute Rehab Dept ([facility]) [phone]  04/14/2024

## 2024-04-14 NOTE — Progress Notes (Addendum)
 PT TX NOTE  04/14/24 1400  PT Visit Information  Last PT Received On 04/14/24  Assistance Needed Pt agreeable to PT, premedicated prior to session. Pt requiring incr time for supine >sit d/t pain, became dizzy in sitting, nauseous. Assisted with STS and lateral steps along EOB --unable to progress mobility further  d/t these reasons and assisted pt back to bed, VSS with BP slightly elevated   History of Present Illness 73 yo male presents to therapy s/p L THA, anterior approach on 04/13/2024 due to failure of conservative measures. Pt PMH includes but is not limited to: CAD, GERD, OP, mild cognitive impairment, CAD, HLD, hypothyroidism, Klippel-Feil sequence, chronic pain, COPD, DOE, OSA, DM II, HTN, DJD, PTSD, peripheral neuropathy, cervical and lumbar surgery and B TKA and R THA.  Subjective Data  Patient Stated Goal to be able to get back to yard work  Precautions  Precautions Fall  Restrictions  Weight Bearing Restrictions Per Provider Order No  Pain Assessment  Pain Assessment Faces  Pain Score 9  Pain Location L hip and LE  Pain Descriptors / Indicators Aching;Constant;Discomfort;Dull;Moaning;Operative site guarding  Pain Intervention(s) Limited activity within patient's tolerance;Monitored during session;Repositioned;Ice applied;Premedicated before session  Cognition  Arousal Alert  Behavior During Therapy WFL for tasks assessed/performed  PT - Cognitive impairments No apparent impairments  Following Commands  Following commands Intact  Communication  Communication No apparent difficulties  Bed Mobility  Overal bed mobility Needs Assistance  Bed Mobility Supine to Sit;Sit to Supine  Supine to sit HOB elevated;Min assist;Mod assist  Sit to supine Mod assist;Min assist  General bed mobility comments cues and A for L LE to EOB, to return to bed mod A for B LE  Transfers  Overall transfer level Needs assistance  Equipment used Rolling walker (2 wheels)  Transfers Sit to/from  Stand  Sit to Stand Min assist;From elevated surface  General transfer comment cues for hand placement and LLE position  Ambulation/Gait  General Gait Details lateral steps alomg  EOB only d/t dizziness, nausea and pain  Balance  Overall balance assessment Needs assistance  Sitting-balance support Feet supported  Sitting balance-Leahy Scale Fair  Standing balance support Bilateral upper extremity supported;During functional activity;Reliant on assistive device for balance  Standing balance-Leahy Scale Poor  Exercises  Exercises Total Joint  Total Joint Exercises  Ankle Circles/Pumps AROM;Both;5 reps   PT - Assessment/Plan  PT Visit Diagnosis Unsteadiness on feet (R26.81);Other abnormalities of gait and mobility (R26.89);Muscle weakness (generalized) (M62.81);Difficulty in walking, not elsewhere classified (R26.2);Pain  Pain - Right/Left Left  Pain - part of body Hip;Leg  PT Frequency (ACUTE ONLY) 7X/week  Follow Up Recommendations Follow physician's recommendations for discharge plan and follow up therapies  Patient can return home with the following A little help with walking and/or transfers;A little help with bathing/dressing/bathroom;Assistance with feeding;Assist for transportation;Help with stairs or ramp for entrance  PT equipment Rolling walker (2 wheels)  AM-PAC PT 6 Clicks Mobility Outcome Measure (Version 2)  Help needed turning from your back to your side while in a flat bed without using bedrails? 4  Help needed moving from lying on your back to sitting on the side of a flat bed without using bedrails? 2  Help needed moving to and from a bed to a chair (including a wheelchair)? 2  Help needed standing up from a chair using your arms (e.g., wheelchair or bedside chair)? 2  Help needed to walk in hospital room? 2  Help needed climbing 3-5 steps with  a railing?  1  6 Click Score 13  Consider Recommendation of Discharge To: CIR/SNF/LTACH  Progressive Mobility  What is the  highest level of mobility based on the mobility assessment? Level 4 (Ambulates with assistance) - Balance while stepping forward/back - Complete  Activity Dangled on edge of bed  PT Goal Progression  Progress towards PT goals Not progressing toward goals - comment (Nausea and pain)  Acute Rehab PT Goals  PT Goal Formulation With patient  Time For Goal Achievement 04/27/24  Potential to Achieve Goals Good  PT Time Calculation  PT Start Time (ACUTE ONLY) 1112  PT Stop Time (ACUTE ONLY) 1130  PT Time Calculation (min) (ACUTE ONLY) 18 min  PT General Charges  $$ ACUTE PT VISIT 1 Visit  PT Treatments  $Therapeutic Activity 8-22 mins

## 2024-04-14 NOTE — Plan of Care (Signed)
  Problem: Education: Goal: Knowledge of General Education information will improve Description: Including pain rating scale, medication(s)/side effects and non-pharmacologic comfort measures Outcome: Not Progressing   Problem: Health Behavior/Discharge Planning: Goal: Ability to manage health-related needs will improve Outcome: Not Progressing   Problem: Clinical Measurements: Goal: Ability to maintain clinical measurements within normal limits will improve Outcome: Not Progressing   Problem: Activity: Goal: Risk for activity intolerance will decrease Outcome: Not Progressing   Problem: Nutrition: Goal: Adequate nutrition will be maintained Outcome: Not Progressing   Problem: Coping: Goal: Level of anxiety will decrease Outcome: Not Progressing   Problem: Pain Managment: Goal: General experience of comfort will improve and/or be controlled Outcome: Not Progressing

## 2024-04-14 NOTE — Progress Notes (Signed)
 Subjective: 1 Day Post-Op Procedure(s) (LRB): ARTHROPLASTY, HIP, TOTAL, ANTERIOR APPROACH (Left) Patient reports pain as moderate.  He had a difficult time with his morning PT session secondary to pain and nausea.  Objective: Vital signs in last 24 hours: Temp:  [97.1 F (36.2 C)-99.1 F (37.3 C)] 97.8 F (36.6 C) (08/30 1134) Pulse Rate:  [55-80] 65 (08/30 1134) Resp:  [9-20] 20 (08/30 1134) BP: (118-170)/(61-75) 170/68 (08/30 1134) SpO2:  [92 %-100 %] 99 % (08/30 1134) FiO2 (%):  [21 %] 21 % (08/30 0057)  Intake/Output from previous day: 08/29 0701 - 08/30 0700 In: 2872.5 [P.O.:360; I.V.:2299; IV Piggyback:213.5] Out: 1655 [Urine:1355; Blood:300] Intake/Output this shift: Total I/O In: 240 [P.O.:240] Out: -   Recent Labs    04/14/24 0420  HGB 10.9*   Recent Labs    04/14/24 0420  WBC 10.7*  RBC 4.61  HCT 34.8*  PLT 249   Recent Labs    04/14/24 0420  NA 136  K 4.7  CL 102  CO2 23  BUN 26*  CREATININE 1.71*  GLUCOSE 128*  CALCIUM 8.7*   No results for input(s): LABPT, INR in the last 72 hours.  Sensation intact distally Intact pulses distally Dorsiflexion/Plantar flexion intact Incision: dressing C/D/I   Assessment/Plan: 1 Day Post-Op Procedure(s) (LRB): ARTHROPLASTY, HIP, TOTAL, ANTERIOR APPROACH (Left) Up with therapy Plan for discharge tomorrow Discharge home with home health  He definitely needs to be here another day given his limited mobility with the hope of getting him home tomorrow.    [name] 04/14/2024, 11:38 AM

## 2024-04-14 NOTE — Progress Notes (Signed)
   04/14/24 0057  BiPAP/CPAP/SIPAP  BiPAP/CPAP/SIPAP Pt Type Adult  BiPAP/CPAP/SIPAP Resmed  FiO2 (%) 21 %  Patient Home Machine Yes  Safety Check Completed by RT for Home Unit Yes, no issues noted  Patient Home Mask Yes  Patient Home Tubing Yes

## 2024-04-14 NOTE — Plan of Care (Signed)
   Problem: Education: Goal: Knowledge of General Education information will improve Description Including pain rating scale, medication(s)/side effects and non-pharmacologic comfort measures Outcome: Progressing

## 2024-04-15 DIAGNOSIS — M1612 Unilateral primary osteoarthritis, left hip: Secondary | ICD-10-CM | POA: Diagnosis not present

## 2024-04-15 LAB — CBC
HCT: 34.3 % — ABNORMAL LOW (ref 39.0–52.0)
Hemoglobin: 10.5 g/dL — ABNORMAL LOW (ref 13.0–17.0)
MCH: 23.1 pg — ABNORMAL LOW (ref 26.0–34.0)
MCHC: 30.6 g/dL (ref 30.0–36.0)
MCV: 75.4 fL — ABNORMAL LOW (ref 80.0–100.0)
Platelets: 230 K/uL (ref 150–400)
RBC: 4.55 MIL/uL (ref 4.22–5.81)
RDW: 15.4 % (ref 11.5–15.5)
WBC: 9.7 K/uL (ref 4.0–10.5)
nRBC: 0 % (ref 0.0–0.2)

## 2024-04-15 MED ORDER — ALBUTEROL SULFATE (2.5 MG/3ML) 0.083% IN NEBU
2.5000 mg | INHALATION_SOLUTION | Freq: Three times a day (TID) | RESPIRATORY_TRACT | Status: DC
  Administered 2024-04-15 – 2024-04-17 (×5): 2.5 mg via RESPIRATORY_TRACT
  Filled 2024-04-15 (×5): qty 3

## 2024-04-15 NOTE — Progress Notes (Signed)
 Physical Therapy Treatment Patient Details Name: [name] MRN: [id number] DOB: [birth year] Today's Date: 04/15/2024   History of Present Illness 73 yo male presents to therapy s/p L THA, anterior approach on 04/13/2024 due to failure of conservative measures. Pt PMH includes but is not limited to: CAD, GERD, OP, mild cognitive impairment, CAD, HLD, hypothyroidism, Klippel-Feil sequence, chronic pain, COPD, DOE, OSA, DM II, HTN, DJD, PTSD, peripheral neuropathy, cervical and lumbar surgery and B TKA and R THA.    PT Comments  Pt  progressing well this session, able to tolerate amb in hallway x 55' with CGA-supervision. Difficulty with bed mobility/pain but overall improved from yesterday. Pt will likely need another day to work with PT and meet goals for safe d/c home    If plan is discharge home, recommend the following: A little help with walking and/or transfers;A little help with bathing/dressing/bathroom;Assistance with feeding;Assist for transportation;Help with stairs or ramp for entrance   Can travel by private vehicle        Equipment Recommendations  Rolling walker (2 wheels)    Recommendations for Other Services       Precautions / Restrictions Precautions Precautions: Fall Restrictions Weight Bearing Restrictions Per Provider Order: No LLE Weight Bearing Per Provider Order: Weight bearing as tolerated     Mobility  Bed Mobility Overal bed mobility: Needs Assistance Bed Mobility: Supine to Sit, Sit to Supine     Supine to sit: Min assist, Mod assist, HOB elevated, Used rails Sit to supine: Min assist, Mod assist, Used rails   General bed mobility comments: cues and A for L LE. incr time and effort    Transfers Overall transfer level: Needs assistance Equipment used: Rolling walker (2 wheels) Transfers: Sit to/from Stand Sit to Stand: Min assist, From elevated surface           General transfer comment: cues for hand placement and LLE position. incr  time needed    Ambulation/Gait Ambulation/Gait assistance: Contact guard assist, Supervision Gait Distance (Feet): 55 Feet Assistive device: Rolling walker (2 wheels) Gait Pattern/deviations: Step-to pattern, Decreased stance time - left, Antalgic, Trunk flexed       General Gait Details: cues for sequence, good stability with RW, no LOB   Stairs             Wheelchair Mobility     Tilt Bed    Modified Rankin (Stroke Patients Only)       Balance   Sitting-balance support: Feet supported, Bilateral upper extremity supported Sitting balance-Leahy Scale: Fair Sitting balance - Comments: heavy reliance on UEs, difficulty, incr time needed to sit fully upright d/t pain- leanign to R to offload L hip   Standing balance support: Bilateral upper extremity supported, During functional activity, Reliant on assistive device for balance Standing balance-Leahy Scale: Poor                              Communication Communication Communication: No apparent difficulties  Cognition Arousal: Alert Behavior During Therapy: WFL for tasks assessed/performed   PT - Cognitive impairments: No apparent impairments                         Following commands: Intact      Cueing Cueing Techniques: Verbal cues, Tactile cues  Exercises      General Comments        Pertinent Vitals/Pain Pain Assessment Pain Assessment: 0-10 Pain  Score: 5  Pain Location: L hip and LE Pain Descriptors / Indicators: Aching, Constant, Moaning, Operative site guarding, Grimacing, Guarding, Sore Pain Intervention(s): Limited activity within patient's tolerance, Monitored during session, Premedicated before session, Repositioned    Home Living                          Prior Function            PT Goals (current goals can now be found in the care plan section) Acute Rehab PT Goals Patient Stated Goal: to be able to get back to yard work PT Goal Formulation:  With patient Time For Goal Achievement: 04/27/24 Potential to Achieve Goals: Good Progress towards PT goals: Progressing toward goals    Frequency    7X/week      PT Plan      Co-evaluation              AM-PAC PT 6 Clicks Mobility   Outcome Measure  Help needed turning from your back to your side while in a flat bed without using bedrails?: None Help needed moving from lying on your back to sitting on the side of a flat bed without using bedrails?: A Lot Help needed moving to and from a bed to a chair (including a wheelchair)?: A Little Help needed standing up from a chair using your arms (e.g., wheelchair or bedside chair)?: A Little Help needed to walk in hospital room?: A Little Help needed climbing 3-5 steps with a railing? : A Lot 6 Click Score: 17    End of Session Equipment Utilized During Treatment: Gait belt Activity Tolerance: Patient tolerated treatment well Patient left: in bed;with call bell/phone within reach;with bed alarm set Nurse Communication: Mobility status PT Visit Diagnosis: Unsteadiness on feet (R26.81);Other abnormalities of gait and mobility (R26.89);Muscle weakness (generalized) (M62.81);Difficulty in walking, not elsewhere classified (R26.2);Pain Pain - Right/Left: Left Pain - part of body: Hip;Leg     Time: 1152-1222 PT Time Calculation (min) (ACUTE ONLY): 30 min  Charges:    $Gait Training: 8-22 mins $Therapeutic Activity: 8-22 mins PT General Charges $$ ACUTE PT VISIT: 1 Visit                     Eulogia Dismore, PT  Acute Rehab Dept [facility]) [phone]  04/15/2024    [facility] 04/15/2024, 1:15 PM

## 2024-04-15 NOTE — Plan of Care (Signed)

## 2024-04-15 NOTE — Plan of Care (Signed)
   Problem: Education: Goal: Knowledge of General Education information will improve Description Including pain rating scale, medication(s)/side effects and non-pharmacologic comfort measures Outcome: Progressing

## 2024-04-15 NOTE — Progress Notes (Signed)
 Subjective: 2 Days Post-Op Procedure(s) (LRB): ARTHROPLASTY, HIP, TOTAL, ANTERIOR APPROACH (Left) Patient reports pain as mild.  Spoke to nurse a few times about patient becoming dizzy with PT.  Vitals/labs were o.k.  switched percocet to norco and added iv fluids which patient tells me has helped.    Objective: Vital signs in last 24 hours: Temp:  [97.8 F (36.6 C)-99.3 F (37.4 C)] 98.7 F (37.1 C) (08/31 0629) Pulse Rate:  [65-70] 65 (08/31 0629) Resp:  [15-20] 16 (08/31 0629) BP: (145-170)/(54-68) 148/63 (08/31 0629) SpO2:  [96 %-100 %] 96 % (08/31 0919)  Intake/Output from previous day: 08/30 0701 - 08/31 0700 In: 840 [P.O.:840] Out: 800 [Urine:800] Intake/Output this shift: No intake/output data recorded.  Recent Labs    04/14/24 0420 04/15/24 0409  HGB 10.9* 10.5*   Recent Labs    04/14/24 0420 04/15/24 0409  WBC 10.7* 9.7  RBC 4.61 4.55  HCT 34.8* 34.3*  PLT 249 230   Recent Labs    04/14/24 0420  NA 136  K 4.7  CL 102  CO2 23  BUN 26*  CREATININE 1.71*  GLUCOSE 128*  CALCIUM 8.7*   No results for input(s): LABPT, INR in the last 72 hours.  Neurologically intact Neurovascular intact Sensation intact distally Intact pulses distally Dorsiflexion/Plantar flexion intact Incision: dressing C/D/I No cellulitis present Compartment soft   Assessment/Plan: 2 Days Post-Op Procedure(s) (LRB): ARTHROPLASTY, HIP, TOTAL, ANTERIOR APPROACH (Left) Advance diet Up with therapy Plan for discharge tomorrow (hopeful for home rather than SNF if able to progress with PT) WBAT LLE       Ronal LITTIE Grave 04/15/2024, 10:09 AM

## 2024-04-15 NOTE — Progress Notes (Signed)
 Physical Therapy Treatment Patient Details Name: [name] MRN: [id number] DOB: [birth year] Today's Date: 04/15/2024   History of Present Illness 73 yo male presents to therapy s/p L THA, anterior approach on 04/13/2024 due to failure of conservative measures. Pt PMH includes but is not limited to: CAD, GERD, OP, mild cognitive impairment, CAD, HLD, hypothyroidism, Klippel-Feil sequence, chronic pain, COPD, DOE, OSA, DM II, HTN, DJD, PTSD, peripheral neuropathy, cervical and lumbar surgery and B TKA and R THA.    PT Comments  Pt reports pain continues to be better today, declines OOB however is agreeable to LE exercises. Reviewed THA HEP and pt tol well. Anticipate pt will likely be ready for d/c after am session of PT tomorrow.    If plan is discharge home, recommend the following: A little help with walking and/or transfers;A little help with bathing/dressing/bathroom;Assistance with feeding;Assist for transportation;Help with stairs or ramp for entrance   Can travel by private vehicle        Equipment Recommendations  Rolling walker (2 wheels)    Recommendations for Other Services       Precautions / Restrictions Precautions Precautions: Fall Restrictions Weight Bearing Restrictions Per Provider Order: No LLE Weight Bearing Per Provider Order: Weight bearing as tolerated     Mobility  Bed Mobility Overal bed mobility: Needs Assistance Bed Mobility: Rolling Rolling: Supervision, Used rails   Supine to sit: Min assist, Mod assist, HOB elevated, Used rails Sit to supine: Min assist, Mod assist, Used rails   General bed mobility comments: cues and A for L LE. incr time and effort    Transfers Overall transfer level: Needs assistance Equipment used: Rolling walker (2 wheels) Transfers: Sit to/from Stand Sit to Stand: Min assist, From elevated surface           General transfer comment: cues for hand placement and LLE position. incr time needed     Ambulation/Gait Ambulation/Gait assistance: Contact guard assist, Supervision Gait Distance (Feet): 55 Feet Assistive device: Rolling walker (2 wheels) Gait Pattern/deviations: Step-to pattern, Decreased stance time - left, Antalgic, Trunk flexed       General Gait Details: cues for sequence, good stability with RW, no LOB   Stairs             Wheelchair Mobility     Tilt Bed    Modified Rankin (Stroke Patients Only)       Balance   Sitting-balance support: Feet supported, Bilateral upper extremity supported Sitting balance-Leahy Scale: Fair Sitting balance - Comments: heavy reliance on UEs, difficulty, incr time needed to sit fully upright d/t pain- leanign to R to offload L hip   Standing balance support: Bilateral upper extremity supported, During functional activity, Reliant on assistive device for balance Standing balance-Leahy Scale: Poor                              Communication Communication Communication: No apparent difficulties  Cognition Arousal: Alert Behavior During Therapy: WFL for tasks assessed/performed   PT - Cognitive impairments: No apparent impairments                         Following commands: Intact      Cueing Cueing Techniques: Verbal cues, Tactile cues  Exercises Total Joint Exercises Ankle Circles/Pumps: AROM, Both, 10 reps Quad Sets: AROM, Both, 10 reps Gluteal Sets: AROM, Left, 10 reps Short Arc Quad: AROM, Strengthening, Left, 10 reps Heel  Slides: AAROM, Left, 10 reps Hip ABduction/ADduction: AAROM, AROM, Left, 10 reps    General Comments        Pertinent Vitals/Pain Pain Assessment Pain Assessment: 0-10 Pain Score: 5  Pain Location: L hip and LE Pain Descriptors / Indicators: Aching, Constant, Moaning, Operative site guarding, Grimacing, Guarding, Sore Pain Intervention(s): Limited activity within patient's tolerance, Monitored during session, Repositioned    Home Living                           Prior Function            PT Goals (current goals can now be found in the care plan section) Acute Rehab PT Goals Patient Stated Goal: to be able to get back to yard work PT Goal Formulation: With patient Time For Goal Achievement: 04/27/24 Potential to Achieve Goals: Good Progress towards PT goals: Progressing toward goals    Frequency    7X/week      PT Plan      Co-evaluation              AM-PAC PT 6 Clicks Mobility   Outcome Measure  Help needed turning from your back to your side while in a flat bed without using bedrails?: None Help needed moving from lying on your back to sitting on the side of a flat bed without using bedrails?: A Lot Help needed moving to and from a bed to a chair (including a wheelchair)?: A Little Help needed standing up from a chair using your arms (e.g., wheelchair or bedside chair)?: A Little Help needed to walk in hospital room?: A Little Help needed climbing 3-5 steps with a railing? : A Lot 6 Click Score: 17    End of Session Equipment Utilized During Treatment: Gait belt Activity Tolerance: Patient tolerated treatment well Patient left: in bed;with call bell/phone within reach;with bed alarm set Nurse Communication: Mobility status PT Visit Diagnosis: Unsteadiness on feet (R26.81);Other abnormalities of gait and mobility (R26.89);Muscle weakness (generalized) (M62.81);Difficulty in walking, not elsewhere classified (R26.2);Pain Pain - Right/Left: Left Pain - part of body: Hip;Leg     Time: 1550-1600 PT Time Calculation (min) (ACUTE ONLY): 10 min  Charges:    $Therapeutic Exercise: 8-22 mins PT General Charges $$ ACUTE PT VISIT: 1 Visit                     Yanitza Shvartsman, PT  Acute Rehab Dept [facility]) [phone]  04/15/2024    [facility] 04/15/2024, 4:17 PM

## 2024-04-16 DIAGNOSIS — M1612 Unilateral primary osteoarthritis, left hip: Secondary | ICD-10-CM | POA: Diagnosis not present

## 2024-04-16 MED ORDER — TIZANIDINE HCL 4 MG PO TABS
4.0000 mg | ORAL_TABLET | Freq: Once | ORAL | Status: AC
  Administered 2024-04-16: 4 mg via ORAL
  Filled 2024-04-16: qty 1

## 2024-04-16 MED ORDER — KETOROLAC TROMETHAMINE 15 MG/ML IJ SOLN
7.5000 mg | Freq: Once | INTRAMUSCULAR | Status: AC
  Administered 2024-04-16: 7.5 mg via INTRAVENOUS
  Filled 2024-04-16: qty 1

## 2024-04-16 NOTE — Progress Notes (Signed)
 PT TX NOTE   04/16/24 1400  PT Visit Information  Last PT Received On 04/16/24  Assistance Needed Pt making progress this pm; amb ~ 40' with RW, CGA;  pain improved however pt dizzy with amb; after amb seated BP114/50. RN and MD aware. Pt and wife concerned about d/c today, pt may benefit from another day to work with PT  for safe d/c home.   History of Present Illness 73 yo male presents to therapy s/p L THA, anterior approach on 04/13/2024 due to failure of conservative measures. Pt PMH includes but is not limited to: CAD, GERD, OP, mild cognitive impairment, CAD, HLD, hypothyroidism, Klippel-Feil sequence, chronic pain, COPD, DOE, OSA, DM II, HTN, DJD, PTSD, peripheral neuropathy, cervical and lumbar surgery and B TKA and R THA.  Subjective Data  Patient Stated Goal to be able to get back to yard work  Precautions  Precautions Fall  Restrictions  Weight Bearing Restrictions Per Provider Order No  LLE Weight Bearing Per Provider Order WBAT  Pain Assessment  Pain Assessment 0-10  Pain Score 5  Pain Location L hip/glutes/LB on L side with sitting position  Pain Descriptors / Indicators Operative site guarding;Grimacing;Guarding;Throbbing;Sore  Pain Intervention(s) Limited activity within patient's tolerance;Monitored during session;Premedicated before session;Repositioned  Cognition  Arousal Alert  Behavior During Therapy WFL for tasks assessed/performed  PT - Cognitive impairments No apparent impairments  Following Commands  Following commands Intact  Cueing  Cueing Techniques Verbal cues;Tactile cues  Communication  Communication No apparent difficulties  Bed Mobility  Overal bed mobility Needs Assistance  Bed Mobility Sit to Supine;Supine to Sit  Supine to sit Mod assist;+2 for safety/equipment  Sit to supine Mod assist;+2 for safety/equipment  General bed mobility comments cues and A for L LE. incr time and effort. pt able to sit EOB and maintain midline  Transfers  Overall  transfer level Needs assistance  Equipment used Rolling walker (2 wheels)  Transfers Sit to/from Stand  Sit to Stand Min assist;From elevated surface  General transfer comment cues for hand placement and LE position  Ambulation/Gait  Ambulation/Gait assistance Contact guard assist;Min assist  Gait Distance (Feet) 40 Feet  Assistive device Rolling walker (2 wheels)  Gait Pattern/deviations Step-to pattern;Decreased stance time - left  General Gait Details cues for sequence, good stability with RW, no LOB  Balance  Sitting-balance support Feet supported;Bilateral upper extremity supported  Sitting balance-Leahy Scale Fair  Sitting balance - Comments heavy reliance on UEs, difficulty, incr time needed to sit fully upright d/t pain- leanign to R to offload L hip  Standing balance support Bilateral upper extremity supported;During functional activity;Reliant on assistive device for balance  Standing balance-Leahy Scale Poor  Total Joint Exercises  Ankle Circles/Pumps AROM;Both;10 reps  Heel Slides AAROM;Left;5 reps (tol hip flexion without significantly incr pain)  PT - End of Session  Equipment Utilized During Treatment Gait belt  Activity Tolerance Patient tolerated treatment well  Patient left in bed;with call bell/phone within reach;with bed alarm set  Nurse Communication Mobility status;Other (comment) (dizzy)   PT - Assessment/Plan  PT Visit Diagnosis Unsteadiness on feet (R26.81);Other abnormalities of gait and mobility (R26.89);Muscle weakness (generalized) (M62.81);Difficulty in walking, not elsewhere classified (R26.2);Pain  Pain - Right/Left Left  Pain - part of body Hip;Leg  PT Frequency (ACUTE ONLY) 7X/week  Follow Up Recommendations Follow physician's recommendations for discharge plan and follow up therapies  Patient can return home with the following A little help with walking and/or transfers;A little help with bathing/dressing/bathroom;Assistance with  feeding;Assist for  transportation;Help with stairs or ramp for entrance  PT equipment Rolling walker (2 wheels)  AM-PAC PT 6 Clicks Mobility Outcome Measure (Version 2)  Help needed turning from your back to your side while in a flat bed without using bedrails? 4  Help needed moving from lying on your back to sitting on the side of a flat bed without using bedrails? 1  Help needed moving to and from a bed to a chair (including a wheelchair)? 3  Help needed standing up from a chair using your arms (e.g., wheelchair or bedside chair)? 3  Help needed to walk in hospital room? 3  Help needed climbing 3-5 steps with a railing?  2  6 Click Score 16  Consider Recommendation of Discharge To: Home with [facility]  Progressive Mobility  Activity Moved bed into chair position  PT Goal Progression  Progress towards PT goals Progressing toward goals  Acute Rehab PT Goals  PT Goal Formulation With patient  Time For Goal Achievement 04/27/24  Potential to Achieve Goals Good  PT Time Calculation  PT Start Time (ACUTE ONLY) 1407  PT Stop Time (ACUTE ONLY) 1425  PT Time Calculation (min) (ACUTE ONLY) 18 min  PT General Charges  $$ ACUTE PT VISIT 1 Visit  PT Treatments  $Gait Training 8-22 mins

## 2024-04-16 NOTE — Plan of Care (Signed)
  Problem: Health Behavior/Discharge Planning: Goal: Ability to manage health-related needs will improve Outcome: Not Progressing   Problem: Activity: Goal: Risk for activity intolerance will decrease Outcome: Not Progressing   Problem: Nutrition: Goal: Adequate nutrition will be maintained Outcome: Not Progressing   Problem: Pain Managment: Goal: General experience of comfort will improve and/or be controlled Outcome: Not Progressing

## 2024-04-16 NOTE — Progress Notes (Signed)
 Patient ID: [name], male   DOB: 1951/07/29, 73 y.o.   MRN: [id number] The patient is awake and alert this morning.  His left operative hip is stable.  His vital signs are stable.  He can be discharged to home today after his PT session.

## 2024-04-16 NOTE — Progress Notes (Signed)
 Physical Therapy Treatment Patient Details Name: [name] MRN: [id number] DOB: 07-31-1951 Today's Date: 04/16/2024   History of Present Illness 73 yo male presents to therapy s/p L THA, anterior approach on 04/13/2024 due to failure of conservative measures. Pt PMH includes but is not limited to: CAD, GERD, OP, mild cognitive impairment, CAD, HLD, hypothyroidism, Klippel-Feil sequence, chronic pain, COPD, DOE, OSA, DM II, HTN, DJD, PTSD, peripheral neuropathy, cervical and lumbar surgery and B TKA and R THA.    PT Comments  Pt comfortable in supine, reports pain is minimal, premedicated prior to PT. Pt reports significantly incr pain L glutes/low back with attempts to sit EOB, pt unable to wt bear L hip in sitting; assisted pt back to supine, L hip and knee left in flexion and pt reports no pain, AAROM L hip is WFL, leg lengths appear at baseline for pt. Pt describes pain as throbbing and then dull in L glutes and low back.  Pt with incr muscle guarding L glutes and low back with gentle palpation, no significant pain over anterior/superior hip. MD updated.    If plan is discharge home, recommend the following: A little help with walking and/or transfers;A little help with bathing/dressing/bathroom;Assistance with feeding;Assist for transportation;Help with stairs or ramp for entrance   Can travel by private vehicle        Equipment Recommendations  Rolling walker (2 wheels)    Recommendations for Other Services       Precautions / Restrictions Precautions Precautions: Fall Restrictions Weight Bearing Restrictions Per Provider Order: No LLE Weight Bearing Per Provider Order: Weight bearing as tolerated     Mobility  Bed Mobility Overal bed mobility: Needs Assistance Bed Mobility: Supine to Sit, Sit to Supine, Rolling     Supine to sit: Min assist, Mod assist, HOB elevated, Used rails Sit to supine: Min assist, Mod assist, Used rails   General bed mobility comments:  cues and A for L LE. incr time and effort. pt unable to come to full sitting position d/t pain--unable shift wt to L side in sitting. ptis able to roll R/L with rail and supervision    Transfers                   General transfer comment: unable d/t pain    Ambulation/Gait                   Stairs             Wheelchair Mobility     Tilt Bed    Modified Rankin (Stroke Patients Only)       Balance   Sitting-balance support: Feet supported, Bilateral upper extremity supported Sitting balance-Leahy Scale: Fair Sitting balance - Comments: heavy reliance on UEs, difficulty, incr time needed to sit fully upright d/t pain- leanign to R to offload L hip   Standing balance support: Bilateral upper extremity supported, During functional activity, Reliant on assistive device for balance Standing balance-Leahy Scale: Poor                              Communication Communication Communication: No apparent difficulties  Cognition Arousal: Alert Behavior During Therapy: WFL for tasks assessed/performed   PT - Cognitive impairments: No apparent impairments                         Following commands: Intact  Cueing Cueing Techniques: Verbal cues, Tactile cues  Exercises Total Joint Exercises Heel Slides: AAROM, Left, 5 reps (tol hip flexion without significantly incr pain)    General Comments        Pertinent Vitals/Pain Pain Assessment Pain Assessment: 0-10 Pain Score: 8  Pain Location: L hip/glutes/LB on L side with sitting position Pain Descriptors / Indicators: Aching, Moaning, Operative site guarding, Grimacing, Guarding, Throbbing Pain Intervention(s): Limited activity within patient's tolerance, Monitored during session, Premedicated before session, Repositioned, Ice applied    Home Living                          Prior Function            PT Goals (current goals can now be found in the care plan  section) Acute Rehab PT Goals Patient Stated Goal: to be able to get back to yard work PT Goal Formulation: With patient Time For Goal Achievement: 04/27/24 Potential to Achieve Goals: Good Progress towards PT goals: Progressing toward goals    Frequency    7X/week      PT Plan      Co-evaluation              AM-PAC PT 6 Clicks Mobility   Outcome Measure  Help needed turning from your back to your side while in a flat bed without using bedrails?: None Help needed moving from lying on your back to sitting on the side of a flat bed without using bedrails?: A Lot Help needed moving to and from a bed to a chair (including a wheelchair)?: A Little Help needed standing up from a chair using your arms (e.g., wheelchair or bedside chair)?: A Little Help needed to walk in hospital room?: A Little Help needed climbing 3-5 steps with a railing? : A Lot 6 Click Score: 17    End of Session Equipment Utilized During Treatment: Gait belt Activity Tolerance: Patient tolerated treatment well Patient left: in bed;with call bell/phone within reach;with bed alarm set Nurse Communication: Mobility status;Other (comment) (pain) PT Visit Diagnosis: Unsteadiness on feet (R26.81);Other abnormalities of gait and mobility (R26.89);Muscle weakness (generalized) (M62.81);Difficulty in walking, not elsewhere classified (R26.2);Pain Pain - Right/Left: Left Pain - part of body: Hip;Leg     Time: 1000-1034 PT Time Calculation (min) (ACUTE ONLY): 34 min  Charges:    $Therapeutic Activity: 23-37 mins PT General Charges $$ ACUTE PT VISIT: 1 Visit                     Viridiana Spaid, PT  Acute Rehab Dept [facility]) [phone]  04/16/2024    [facility] 04/16/2024, 11:28 AM

## 2024-04-17 ENCOUNTER — Telehealth: Payer: Self-pay | Admitting: Orthopaedic Surgery

## 2024-04-17 ENCOUNTER — Encounter (HOSPITAL_COMMUNITY): Payer: Self-pay | Admitting: Orthopaedic Surgery

## 2024-04-17 DIAGNOSIS — M1612 Unilateral primary osteoarthritis, left hip: Secondary | ICD-10-CM | POA: Diagnosis not present

## 2024-04-17 NOTE — Telephone Encounter (Signed)
Patient would like to know when he can shower?

## 2024-04-17 NOTE — Progress Notes (Signed)
   04/17/24 0000  BiPAP/CPAP/SIPAP  BiPAP/CPAP/SIPAP Pt Type Adult  BiPAP/CPAP/SIPAP Resmed  Mask Type Full face mask  Dentures removed? Yes - Placed in denture cup  FiO2 (%) 21 %  Patient Home Machine Yes  Safety Check Completed by RT for Home Unit Yes, no issues noted  Patient Home Mask Yes  Patient Home Tubing Yes  Auto Titrate  (home settings)  Device Plugged into RED Power Outlet Yes

## 2024-04-17 NOTE — Progress Notes (Signed)
 Physical Therapy Treatment Patient Details Name: [name] MRN: [id number] DOB: [birth year] Today's Date: 04/17/2024   History of Present Illness 73 yo male presents to therapy s/p L THA, anterior approach on 04/13/2024 due to failure of conservative measures. Pt PMH includes but is not limited to: CAD, GERD, OP, mild cognitive impairment, CAD, HLD, hypothyroidism, Klippel-Feil sequence, chronic pain, COPD, DOE, OSA, DM II, HTN, DJD, PTSD, peripheral neuropathy, cervical and lumbar surgery and B TKA and R THA.    PT Comments  Excellent progress today, decr assist needed with all aspects of mobility. Pt able to progress to EOB and sit upright with supervision, pillow kept under LLE while progressing to sitting position as this helps pain per pt. Amb ~ 62' with RW and supervision. Discussed progress with pt wife who arrived EOS, pt is ready to d/c with family support as needed from PT standpoint.   If plan is discharge home, recommend the following: A little help with walking and/or transfers;A little help with bathing/dressing/bathroom;Assistance with feeding;Assist for transportation;Help with stairs or ramp for entrance   Can travel by private vehicle        Equipment Recommendations  Rolling walker (2 wheels)    Recommendations for Other Services       Precautions / Restrictions Precautions Precautions: Fall Restrictions Weight Bearing Restrictions Per Provider Order: No LLE Weight Bearing Per Provider Order: Weight bearing as tolerated     Mobility  Bed Mobility Overal bed mobility: Needs Assistance Bed Mobility: Sit to Supine, Supine to Sit     Supine to sit: Mod assist, +2 for safety/equipment Sit to supine: Mod assist, +2 for safety/equipment   General bed mobility comments: cues and A for L LE. incr time and effort. pt able to sit EOB and maintain midline    Transfers Overall transfer level: Needs assistance Equipment used: Rolling walker (2 wheels) Transfers:  Sit to/from Stand Sit to Stand: Min assist, From elevated surface           General transfer comment: cues for hand placement and LE position    Ambulation/Gait Ambulation/Gait assistance: Contact guard assist-superv Gait Distance (Feet): 130 Feet Assistive device: Rolling walker (2 wheels) Gait Pattern/deviations: Step-to pattern, Decreased stance time - left, Step-through pattern Gait velocity: decreased but functional     General Gait Details: cues for sequence, good stability with RW, no LOB. progression to near step through pattern end of distance   Stairs             Wheelchair Mobility     Tilt Bed    Modified Rankin (Stroke Patients Only)       Balance   Sitting-balance support: Feet supported, Bilateral upper extremity supported Sitting balance-Leahy Scale: Fair Sitting balance - Comments: heavy reliance on UEs, difficulty, incr time needed to sit fully upright d/t pain- leanign to R to offload L hip   Standing balance support: Bilateral upper extremity supported, During functional activity, Reliant on assistive device for balance Standing balance-Leahy Scale: Poor                              Communication Communication Communication: No apparent difficulties  Cognition Arousal: Alert Behavior During Therapy: WFL for tasks assessed/performed   PT - Cognitive impairments: No apparent impairments                         Following commands: Intact  Cueing Cueing Techniques: Verbal cues, Tactile cues  Exercises Total Joint Exercises Ankle Circles/Pumps: AROM, Both, 10 reps Heel Slides: AAROM, Left, 5 reps, AROM (pt is able to self assist)    General Comments        Pertinent Vitals/Pain Pain Assessment Pain Location: L hip/glutes/LB on L side with sitting position Pain Descriptors / Indicators: Operative site guarding, Grimacing, Guarding, Throbbing, Sore    Home Living                           Prior Function            PT Goals (current goals can now be found in the care plan section) Acute Rehab PT Goals Patient Stated Goal: to be able to get back to yard work PT Goal Formulation: With patient Time For Goal Achievement: 04/27/24 Potential to Achieve Goals: Good Progress towards PT goals: Progressing toward goals    Frequency    7X/week      PT Plan      Co-evaluation              AM-PAC PT 6 Clicks Mobility   Outcome Measure  Help needed turning from your back to your side while in a flat bed without using bedrails?: None Help needed moving from lying on your back to sitting on the side of a flat bed without using bedrails?: A Little Help needed moving to and from a bed to a chair (including a wheelchair)?: A Little Help needed standing up from a chair using your arms (e.g., wheelchair or bedside chair)?: A Little Help needed to walk in hospital room?: A Little Help needed climbing 3-5 steps with a railing? : A Little 6 Click Score: 19    End of Session Equipment Utilized During Treatment: Gait belt Activity Tolerance: Patient tolerated treatment well Patient left: in bed;with call bell/phone within reach;with bed alarm set;with family/visitor present Nurse Communication: Mobility status;Other (comment) (ready to d/c) PT Visit Diagnosis: Unsteadiness on feet (R26.81);Other abnormalities of gait and mobility (R26.89);Muscle weakness (generalized) (M62.81);Difficulty in walking, not elsewhere classified (R26.2);Pain Pain - Right/Left: Left Pain - part of body: Hip;Leg     Time: 9049-8978 PT Time Calculation (min) (ACUTE ONLY): 31 min  Charges:    $Gait Training: 23-37 mins PT General Charges $$ ACUTE PT VISIT: 1 Visit                     [name], PT  Acute Rehab Dept [facility]) [phone]  04/17/2024    [facility] 04/17/2024, 11:37 AM

## 2024-04-17 NOTE — Progress Notes (Signed)
 Patient ID: [name], male   DOB: 1950/12/27, 73 y.o.   MRN: [id number] The patient ended up needing to stay another day for pain control purposes.  This morning he is awake and alert and says he feels much better and has kind of figured out how to adjust how he is taking medications and positioning his hip to deal with the pain aspect of things.  His vital signs are stable and his left operative hip is stable.  He looks much more comfortable overall.  Will have therapy get him up again today and hopefully discharge to home today.  The patient seems motivated about this as well in terms of going home today.

## 2024-04-17 NOTE — Plan of Care (Signed)
  Problem: Pain Management: Goal: Pain level will decrease with appropriate interventions Outcome: Progressing   Problem: Clinical Measurements: Goal: Postoperative complications will be avoided or minimized Outcome: Progressing   Problem: Education: Goal: Knowledge of the prescribed therapeutic regimen will improve Outcome: Progressing   Problem: Safety: Goal: Ability to remain free from injury will improve Outcome: Progressing   Problem: Elimination: Goal: Will not experience complications related to urinary retention Outcome: Progressing

## 2024-04-17 NOTE — Discharge Summary (Signed)
 Patient ID: [name] MRN: [id number] DOB/AGE: [birth year] 73 y.o.  Admit date: 04/13/2024 Discharge date: 04/17/2024  Admission Diagnoses:  Principal Problem:   Unilateral primary osteoarthritis, left hip Active Problems:   Status post total replacement of left hip   Discharge Diagnoses:  Same  Past Medical History:  Diagnosis Date   Abnormal CT scan of lung 07/23/2021   Abnormality of gait    Allergic rhinitis due to pollen 11/25/2010   Bilateral primary osteoarthritis of hip 12/12/2019   Carpal tunnel syndrome on right    Chronic kidney disease    Complication of anesthesia    Hard to wake up, has narcolepsy   Contact with and (suspected) exposure to asbestos 07/23/2021   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   Hyperlipidemia    Hypothyroidism    Klippel-Feil sequence    Learning disability, unspecified 1997   Reading/reading comprehension   Left inguinal hernia    Low back pain 12/12/2019   Lumbar spondylosis    Major depressive disorder    Mild cognitive impairment 10/18/2022   Nonspecific abnormal electrocardiogram (ECG) (EKG)    Obstructive sleep apnea 12/15/2015   BiPAP   Osteoarthritis    Osteoporosis    Pain in joints of right hand    Peripheral neuropathy    Peripheral venous insufficiency    Pituitary abnormality    pituitary edema; takes bromocriptine    PTSD (post-traumatic stress disorder) 11/25/2010   Spinal stenosis of lumbar region 09/14/2018   Type 2 diabetes mellitus without complications  11/25/2010   Vitamin D deficiency    Weakness of left leg    Wears glasses     Surgeries: Procedure(s): ARTHROPLASTY, HIP, TOTAL, ANTERIOR APPROACH on 04/13/2024   Consultants:   Discharged Condition: Improved  Hospital Course: [name] is an 73 y.o. male who was admitted 04/13/2024 for operative treatment ofUnilateral primary osteoarthritis, left hip. Patient has severe unremitting pain that affects sleep, daily activities, and work/hobbies. After pre-op clearance the patient was taken to the operating room on 04/13/2024 and underwent  Procedure(s): ARTHROPLASTY, HIP, TOTAL, ANTERIOR APPROACH.    Patient was given perioperative antibiotics:  Anti-infectives (From admission, onward)    Start     Dose/Rate Route Frequency Ordered Stop   04/13/24 1800  ceFAZolin  (ANCEF ) IVPB 2g/100 mL premix        2 g 200 mL/hr over 30 Minutes Intravenous Every 6 hours 04/13/24 1508 04/13/24 2343   04/13/24 1000  ceFAZolin  (ANCEF ) IVPB 2g/100 mL premix        2 g 200 mL/hr over 30 Minutes Intravenous On call to O.R. 04/13/24 0948 04/13/24 1140        Patient was given sequential compression devices, early ambulation, and chemoprophylaxis to prevent DVT.  Inpatient Morphine  Milligram Equivalents Per Day 8/30 - 9/2     No orders with morphine  equivalence       Patient benefited maximally from hospital stay and there were no complications.    Recent vital signs: Patient Vitals for the past 24 hrs:  BP  Temp Temp src Pulse Resp SpO2  04/17/24 0920 -- -- -- -- -- 98 %  04/17/24 0533 (!) 140/70 98.6 F (37 C) Oral 82 18 98 %  04/16/24 2122 (!) 144/62 99.3 F (37.4 C) Oral 94 18 94 %  04/16/24 1810 -- -- -- -- -- 98 %  04/16/24 1424 (!) 114/50 -- -- 85 -- --  04/16/24 1418 -- -- -- -- -- 98 %  04/16/24 1356 (!) 110/52 -- -- 70 16 --  04/16/24 1354 (!) 100/42 98.6 F (37 C) Oral 69 16 98 %     Recent laboratory studies:  Recent Labs    04/15/24 0409  WBC 9.7  HGB 10.5*  HCT  34.3*  PLT 230     Discharge Medications:   Allergies as of 04/17/2024       Reactions   Misc. Sulfonamide Containing Compounds Other (See Comments)   Ace Inhibitors Swelling   Sulfa Antibiotics Swelling        Medication List     STOP taking these medications    aspirin  EC 81 MG tablet Replaced by: aspirin  81 MG chewable tablet       TAKE these medications    albuterol  (2.5 MG/3ML) 0.083% nebulizer solution Commonly known as: PROVENTIL  Take 3 mLs (2.5 mg total) by nebulization every 4 (four) hours as needed for wheezing or shortness of breath.   albuterol  108 (90 Base) MCG/ACT inhaler Commonly known as: VENTOLIN  HFA Inhale 2 puffs into the lungs every 6 (six) hours as needed for wheezing or shortness of breath.   amLODipine  10 MG tablet Commonly known as: NORVASC  Take 10 mg by mouth daily.   aspirin  81 MG chewable tablet Chew 1 tablet (81 mg total) by mouth 2 (two) times daily. Replaces: aspirin  EC 81 MG tablet   azithromycin  250 MG tablet Commonly known as: Zithromax  Z-Pak Take 1 tablet (250 mg total) by mouth 3 (three) times a week.   bisacodyl  5 MG EC tablet Generic drug: bisacodyl  Take 1 tablet (5 mg total) by mouth as directed.   buPROPion  150 MG 24 hr tablet Commonly known as: WELLBUTRIN  XL Take 150 mg by mouth daily.   cefdinir  300 MG capsule Commonly known as: OMNICEF  Take 1 capsule (300 mg total) by mouth 2 (two) times daily.   chlorhexidine  4 % external liquid Commonly known as: HIBICLENS  Apply 15 mLs (1 Application total) topically as directed for 30 doses. Use as directed daily for 5 days every other week for 6 weeks.   cyanocobalamin  1000 MCG tablet Commonly known as: VITAMIN B12 Take 1,000 mcg by mouth daily.   empagliflozin  25 MG Tabs tablet Commonly known as: JARDIANCE  Take 12.5 mg by mouth daily.   FERGON PO Take 45 mg by mouth daily.   fexofenadine 180 MG tablet Commonly known as: ALLEGRA Take 180 mg by mouth daily.    fluticasone  50 MCG/ACT nasal spray Commonly known as: FLONASE  Place 1 spray into both nostrils in the morning and at bedtime.   fluticasone -salmeterol 250-50 MCG/ACT Aepb Commonly known as: Wixela Inhub Inhale 1 puff into the lungs in the morning and at bedtime.   gabapentin  400 MG capsule Commonly known as: NEURONTIN  Take 400 mg by mouth 3 (three) times daily.   hydrALAZINE  50 MG tablet Commonly known as: APRESOLINE  Take 50 mg by mouth 3 (three) times daily.   hydrOXYzine  10 MG tablet Commonly known as: ATARAX  Take 20 mg by mouth at bedtime.   losartan  100 MG tablet Commonly  known as: COZAAR  Take 100 mg by mouth every evening.   melatonin 3 MG Tabs tablet Take 6 mg by mouth at bedtime.   metFORMIN  500 MG tablet Commonly known as: GLUCOPHAGE  Take 500 mg by mouth 2 (two) times daily with a meal.   methocarbamol  500 MG tablet Commonly known as: ROBAXIN  Take 1 tablet (500 mg total) by mouth every 6 (six) hours as needed for muscle spasms.   modafinil  100 MG tablet Commonly known as: PROVIGIL  Take 200 mg (2 tablets) in the morning and 100 mg (one tablet) at noon as directed   mupirocin  ointment 2 % Commonly known as: BACTROBAN  Place 1 Application into the nose 2 (two) times daily for 60 doses. Use as directed 2 times daily for 5 days every other week for 6 weeks.   NON FORMULARY BIPAP at bedtime   omeprazole  20 MG tablet Commonly known as: PRILOSEC  OTC Take 1 tablet (20 mg total) by mouth daily.   ondansetron  4 MG disintegrating tablet Commonly known as: ZOFRAN -ODT Take 1 tablet (4 mg total) by mouth every 8 (eight) hours as needed.   oxyCODONE  5 MG immediate release tablet Commonly known as: Oxy IR/ROXICODONE  Take 1-2 tablets (5-10 mg total) by mouth every 6 (six) hours as needed for moderate pain (pain score 4-6) (pain score 4-6).   PARoxetine  40 MG tablet Commonly known as: PAXIL  Take 40 mg by mouth at bedtime.   pravastatin  40 MG tablet Commonly known  as: PRAVACHOL  Take 40 mg by mouth at bedtime.   QUEtiapine  25 MG tablet Commonly known as: SEROQUEL  Take 25 mg by mouth at bedtime.   Spiriva  Respimat 2.5 MCG/ACT Aers Generic drug: Tiotropium Bromide  Monohydrate Inhale 2 puffs into the lungs daily.   spironolactone  25 MG tablet Commonly known as: ALDACTONE  Take 50 mg by mouth daily.   SYSTANE OP Place 1 drop into both eyes daily as needed (dry eyes).   tamsulosin  0.4 MG Caps capsule Commonly known as: FLOMAX  Take 0.4 mg by mouth every morning.   triamcinolone ointment 0.1 % Commonly known as: KENALOG Apply 1 application  topically daily as needed (rash).   Turmeric 400 MG Caps Take 400 mg by mouth daily.   Vitamin D3 25 MCG (1000 UT) Caps Take 1,000 Units by mouth every morning.   vitamin E  180 MG (400 UNITS) capsule Take 400 Units by mouth daily.               Durable Medical Equipment  (From admission, onward)           Start     Ordered   04/13/24 1509  DME 3 n 1  Once        04/13/24 1508   04/13/24 1509  DME Walker rolling  Once       Question Answer Comment  Walker: With 5 Inch Wheels   Patient needs a walker to treat with the following condition Status post total replacement of left hip      04/13/24 1508            Diagnostic Studies: DG Pelvis Portable Result Date: 04/13/2024 CLINICAL DATA:  LEFT hip total arthroplasty. EXAM: PORTABLE PELVIS 1-2 VIEWS COMPARISON:  12/19/2023 FINDINGS: LEFT hip total arthroplasty. No complicating features. RIGHT hip arthroplasty noted. IMPRESSION: LEFT hip total arthroplasty without complicating features. Electronically Signed   By: Jackquline Boxer M.D.   On: 04/13/2024 15:29   DG HIP UNILAT WITH PELVIS 1V LEFT Result Date: 04/13/2024 CLINICAL DATA:  Elective  surgery EXAM: DG HIP (WITH OR WITHOUT PELVIS) 1V*L* COMPARISON:  None Available. FINDINGS: Five intraoperative spot views demonstrate LEFT hip arthroplasty. Total arthroplasty components are well  seated. IMPRESSION: No complication following total hip arthroplasty Electronically Signed   By: Jackquline Boxer M.D.   On: 04/13/2024 15:28   DG C-Arm 1-60 Min-No Report Result Date: 04/13/2024 Fluoroscopy was utilized by the requesting physician.  No radiographic interpretation.   DG C-Arm 1-60 Min-No Report Result Date: 04/13/2024 Fluoroscopy was utilized by the requesting physician.  No radiographic interpretation.   DG Chest 2 View Result Date: 03/26/2024 CLINICAL DATA:  Cough. EXAM: CHEST - 2 VIEW COMPARISON:  12/03/2021.  Chest CT, 11/30/2023. FINDINGS: Cardiac silhouette is normal in size and configuration. No mediastinal or hilar masses. No evidence of adenopathy. Left hemidiaphragmatic pleural base calcifications are associated with linear left lung base opacities. There is pleural thickening along the left mid to lower hemithorax. These findings are stable. Remainder of the lungs is clear. No pleural effusion or pneumothorax. Skeletal structures are intact. IMPRESSION: 1. No acute cardiopulmonary disease. 2. Chronic findings on the left likely a due to a previous left pneumonia/empyema. Electronically Signed   By: Alm Parkins M.D.   On: 03/26/2024 15:45    Disposition: Discharge disposition: 01-Home or Self Care          Follow-up Information     Vernetta Lonni GRADE, MD Follow up in 2 week(s).   Specialty: Orthopedic Surgery Contact information: [address] [place]  [name] [address] [phone]                  Signed: Lonni GRADE Vernetta 04/17/2024, 1:41 PM

## 2024-04-18 NOTE — Telephone Encounter (Signed)
Patient wife aware of below message

## 2024-04-20 ENCOUNTER — Encounter: Admitting: Internal Medicine

## 2024-04-23 ENCOUNTER — Telehealth (INDEPENDENT_AMBULATORY_CARE_PROVIDER_SITE_OTHER): Admitting: Pulmonary Disease

## 2024-04-23 ENCOUNTER — Ambulatory Visit: Admitting: Pulmonary Disease

## 2024-04-23 DIAGNOSIS — G4733 Obstructive sleep apnea (adult) (pediatric): Secondary | ICD-10-CM

## 2024-04-23 DIAGNOSIS — G47419 Narcolepsy without cataplexy: Secondary | ICD-10-CM

## 2024-04-23 DIAGNOSIS — J449 Chronic obstructive pulmonary disease, unspecified: Secondary | ICD-10-CM

## 2024-04-23 DIAGNOSIS — R918 Other nonspecific abnormal finding of lung field: Secondary | ICD-10-CM | POA: Diagnosis not present

## 2024-04-23 DIAGNOSIS — J209 Acute bronchitis, unspecified: Secondary | ICD-10-CM

## 2024-04-23 DIAGNOSIS — R0602 Shortness of breath: Secondary | ICD-10-CM

## 2024-04-23 NOTE — Patient Instructions (Signed)
 Obstructive sleep apnea - Download from your machine shows machine is working well  Recent COPD exacerbation - Appears that you are doing better  Continue using Provigil   Tentative follow-up in about 3 months  Call us  with significant concerns

## 2024-04-23 NOTE — Progress Notes (Signed)
 Virtual Visit via Video Note  I connected with Ediberto Sens Cerino on 04/23/24 at 10:00 AM EDT by a video enabled telemedicine application and verified that I am speaking with the correct person using two identifiers.  Location: Patient: Patient was at home Provider: [address] in the office   I discussed the limitations of evaluation and management by telemedicine and the availability of in person appointments. The patient expressed understanding and agreed to proceed.  History of Present Illness:  Patient for routine follow-up of sleep disordered breathing, narcolepsy, obstructive lung disease  Has been doing relatively well  Was recently treated for COPD exacerbation with a course of antibiotics  He continues using his inhalers regularly, with adequate symptom control  Recent hip surgery, tolerating physical therapy well   Observations/Objective:  He looks well on camera, does not appear acutely short of breath  Records were reviewed Last visit with Dr. Darlean noted  Assessment and Plan:  Download from the BiPAP machine reviewed showing excellent compliance Average use of 5 hours 41 minutes Maximum IPAP of 18, minimum EPAP of 14 with pressure support of 4 Residual AHI of 6.1   Obstructive sleep apnea - Continue BiPAP  COPD with recent exacerbation - Continue inhalers - Continue azithromycin  3 times a week  Narcolepsy - Continue modafinil   History of PTSD - Controlled symptoms  Rounded atelectasis at left base - Stable on previous CTs   Follow Up Instructions: Follow-up in about 3 months   I discussed the assessment and treatment plan with the patient. The patient was provided an opportunity to ask questions and all were answered. The patient agreed with the plan and demonstrated an understanding of the instructions.   The patient was advised to call back or seek an in-person evaluation if the symptoms worsen or if the condition fails to improve as  anticipated.  I provided 22 minutes of non-face-to-face time during this encounter.   Jennet DELENA Epley, MD Okay okay

## 2024-04-26 ENCOUNTER — Ambulatory Visit: Admitting: Orthopaedic Surgery

## 2024-04-26 ENCOUNTER — Encounter: Payer: Self-pay | Admitting: Orthopaedic Surgery

## 2024-04-26 DIAGNOSIS — Z96642 Presence of left artificial hip joint: Secondary | ICD-10-CM

## 2024-04-26 MED ORDER — OXYCODONE HCL 5 MG PO TABS: 5.0000 mg | ORAL_TABLET | Freq: Four times a day (QID) | ORAL | 0 refills | Status: DC | PRN

## 2024-04-26 NOTE — Progress Notes (Signed)
 The patient is a 73 year old gentleman who is here today for his first postoperative visit 2 weeks status post a left total hip placement to treat significant left hip pain and arthritis.  He is on a rolling walker today as he comes to see us .  He does have a history of a right hip replacement and both his knees replaced.  These are all remote and were done elsewhere.  He is also a veteran and a patient of the [facility].  He is talking about having them perform a disability rating at some point once we order that when he is out of the immediate postoperative period from this hip replacement.  He has been having home therapy as well.  He does need a refill of pain medication.  He has been on a baby aspirin  twice a day but was on it once a day prior to surgery which he can go back to once a day.  He has also been wearing TED hose and since he is not having a lot of foot and ankle swelling can stop wearing the TED hose.  On exam his left hip incision looks good.  Staples removed and Steri-Strips applied.  Overall he looks good and the hip is moving well.  From our standpoint we will see him back in 4 weeks to see how he is doing overall.  No x-rays are needed.

## 2024-05-11 ENCOUNTER — Ambulatory Visit: Admitting: Gastroenterology

## 2024-05-11 ENCOUNTER — Encounter: Payer: Self-pay | Admitting: Gastroenterology

## 2024-05-11 VITALS — BP 165/75 | HR 60 | Temp 98.0°F | Resp 12 | Ht 73.5 in | Wt 228.0 lb

## 2024-05-11 DIAGNOSIS — D124 Benign neoplasm of descending colon: Secondary | ICD-10-CM

## 2024-05-11 DIAGNOSIS — K5730 Diverticulosis of large intestine without perforation or abscess without bleeding: Secondary | ICD-10-CM | POA: Diagnosis not present

## 2024-05-11 DIAGNOSIS — K648 Other hemorrhoids: Secondary | ICD-10-CM

## 2024-05-11 DIAGNOSIS — K635 Polyp of colon: Secondary | ICD-10-CM

## 2024-05-11 DIAGNOSIS — K640 First degree hemorrhoids: Secondary | ICD-10-CM

## 2024-05-11 DIAGNOSIS — Z1211 Encounter for screening for malignant neoplasm of colon: Secondary | ICD-10-CM

## 2024-05-11 DIAGNOSIS — D125 Benign neoplasm of sigmoid colon: Secondary | ICD-10-CM

## 2024-05-11 MED ORDER — SODIUM CHLORIDE 0.9 % IV SOLN: 500.0000 mL | Freq: Once | INTRAVENOUS | Status: AC

## 2024-05-11 NOTE — Patient Instructions (Signed)
 YOU HAD AN ENDOSCOPIC PROCEDURE TODAY AT THE [facility]:   Refer to the procedure report that was given to you for any specific questions about what was found during the examination.  If the procedure report does not answer your questions, please call your gastroenterologist to clarify.  If you requested that your care partner not be given the details of your procedure findings, then the procedure report has been included in a sealed envelope for you to review at your convenience later.  YOU SHOULD EXPECT: Some feelings of bloating in the abdomen. Passage of more gas than usual.  Walking can help get rid of the air that was put into your GI tract during the procedure and reduce the bloating. If you had a lower endoscopy (such as a colonoscopy or flexible sigmoidoscopy) you may notice spotting of blood in your stool or on the toilet paper. If you underwent a bowel prep for your procedure, you may not have a normal bowel movement for a few days.  Please Note:  You might notice some irritation and congestion in your nose or some drainage.  This is from the oxygen used during your procedure.  There is no need for concern and it should clear up in a day or so.  SYMPTOMS TO REPORT IMMEDIATELY:  Following lower endoscopy (colonoscopy or flexible sigmoidoscopy):  Excessive amounts of blood in the stool  Significant tenderness or worsening of abdominal pains  Swelling of the abdomen that is new, acute  Fever of 100F or higher   For urgent or emergent issues, a gastroenterologist can be reached at any hour by calling [phone]. Do not use MyChart messaging for urgent concerns.    DIET:  We do recommend a small meal at first, but then you may proceed to your regular diet.  Drink plenty of fluids but you should avoid alcoholic beverages for 24 hours.  MEDICATIONS: Continue present medications.   FOLLOW UP: Await pathology results. Repeat colonoscopy for surveillance based on pathology  results. Return to GI clinic as needed.  Educational handouts given to patient: Polyps, Diverticulosis, Hemorrhoids.  Thank you for allowing us  to provide for your healthcare needs today.  ACTIVITY:  You should plan to take it easy for the rest of today and you should NOT DRIVE or use heavy machinery until tomorrow (because of the sedation medicines used during the test).    FOLLOW UP: Our staff will call the number listed on your records the next business day following your procedure.  We will call around 7:15- 8:00 am to check on you and address any questions or concerns that you may have regarding the information given to you following your procedure. If we do not reach you, we will leave a message.     If any biopsies were taken you will be contacted by phone or by letter within the next 1-3 weeks.  Please call us  at [phone] if you have not heard about the biopsies in 3 weeks.    SIGNATURES/CONFIDENTIALITY: You and/or your care partner have signed paperwork which will be entered into your electronic medical record.  These signatures attest to the fact that that the information above on your After Visit Summary has been reviewed and is understood.  Full responsibility of the confidentiality of this discharge information lies with you and/or your care-partner.

## 2024-05-11 NOTE — Op Note (Signed)
 [facility] Patient Name: [name] Procedure Date: 05/11/2024 3:02 PM MRN: [id number] Endoscopist: Sandor Flatter , MD, [id number] Age: 73 Referring MD:  Date of Birth: April 03, 1951 Gender: Male Account #: [id number] Procedure:                Colonoscopy Indications:              Screening for colorectal malignant neoplasm (last                            colonoscopy was 10 years ago).                           Last colonoscopy was in 2015 at the [facility]. Otherwise,                            no GI symptoms. Medicines:                Monitored Anesthesia Care Procedure:                Pre-Anesthesia Assessment:                           - Prior to the procedure, a History and Physical                            was performed, and patient medications and                            allergies were reviewed. The patient's tolerance of                            previous anesthesia was also reviewed. The risks                            and benefits of the procedure and the sedation                            options and risks were discussed with the patient.                            All questions were answered, and informed consent                            was obtained. Prior Anticoagulants: The patient has                            taken no anticoagulant or antiplatelet agents. ASA                            Grade Assessment: II - A patient with mild systemic                            disease. After reviewing the risks and benefits,  the patient was deemed in satisfactory condition to                            undergo the procedure.                           After obtaining informed consent, the colonoscope                            was passed under direct vision. Throughout the                            procedure, the patient's blood pressure, pulse, and                            oxygen saturations were monitored continuously. The                             Olympus Scope SN: [id number] was introduced through                            the anus and advanced to the the terminal ileum.                            The colonoscopy was performed without difficulty.                            The patient tolerated the procedure well. The                            quality of the bowel preparation was good. The                            terminal ileum, ileocecal valve, appendiceal                            orifice, and rectum were photographed. Scope In: 3:13:39 PM Scope Out: 3:32:26 PM Scope Withdrawal Time: 0 hours 16 minutes 21 seconds  Total Procedure Duration: 0 hours 18 minutes 47 seconds  Findings:                 The perianal and digital rectal examinations were                            normal.                           Four sessile polyps were found in the sigmoid colon                            and descending colon. The polyps were 3 to 6 mm in                            size. These polyps were removed with a cold snare.  Resection and retrieval were complete. Estimated                            blood loss was minimal.                           Multiple small-mouthed diverticula were found in                            the transverse colon and ascending colon.                           Retroflexion in the rectum was not performed due to                            anatomy.                           Non-bleeding internal hemorrhoids were found. The                            hemorrhoids were small.                           The terminal ileum appeared normal. Complications:            No immediate complications. Estimated Blood Loss:     Estimated blood loss was minimal. Impression:               - Four 3 to 6 mm polyps in the sigmoid colon and in                            the descending colon, removed with a cold snare.                            Resected and retrieved.                            - Diverticulosis in the transverse colon and in the                            ascending colon.                           - Non-bleeding internal hemorrhoids.                           - The examined portion of the ileum was normal. Recommendation:           - Patient has a contact number available for                            emergencies. The signs and symptoms of potential                            delayed complications were discussed with the  patient. Return to normal activities tomorrow.                            Written discharge instructions were provided to the                            patient.                           - Resume previous diet.                           - Continue present medications.                           - Await pathology results.                           - Repeat colonoscopy for surveillance based on                            pathology results.                           - Return to GI office PRN. Sandor Flatter, MD 05/11/2024 3:43:02 PM

## 2024-05-11 NOTE — Progress Notes (Signed)
 Called to room to assist during endoscopic procedure.  Patient ID and intended procedure confirmed with present staff. Received instructions for my participation in the procedure from the performing physician.

## 2024-05-11 NOTE — Progress Notes (Signed)
 Report given to PACU, vss

## 2024-05-11 NOTE — Progress Notes (Signed)
 GASTROENTEROLOGY PROCEDURE H&P NOTE   Primary Care Physician: [name], MD    Reason for Procedure:  Colon Cancer screening  Plan:    Colonoscopy  Patient is appropriate for endoscopic procedure(s) in the ambulatory ([facility]) setting.  The nature of the procedure, as well as the risks, benefits, and alternatives were carefully and thoroughly reviewed with the patient. Ample time for discussion and questions allowed. The patient understood, was satisfied, and agreed to proceed.     HPI: [name] is a 73 y.o. male who presents for colonoscopy for routine Colon Cancer screening.  No active GI symptoms.  No known family history of colon cancer or related malignancy.  Patient is otherwise without complaints or active issues today.    Last colonoscopy was in 2015 at the [facility].   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   Hyperlipidemia    Hypothyroidism    Klippel-Feil sequence    Learning disability, unspecified 1997   Reading/reading comprehension   Left inguinal hernia    Low back pain 12/12/2019   Lumbar spondylosis    Major depressive  disorder    Mild cognitive impairment 10/18/2022   Nonspecific abnormal electrocardiogram (ECG) (EKG)    Obstructive sleep apnea 12/15/2015   BiPAP   Osteoarthritis    Osteoporosis    Pain in joints of right hand    Peripheral neuropathy    Peripheral venous insufficiency    Pituitary abnormality    pituitary edema; takes bromocriptine    PTSD (post-traumatic stress disorder) 11/25/2010   Spinal stenosis of lumbar region 09/14/2018   Type 2 diabetes mellitus without complications 11/25/2010   Vitamin D deficiency    Weakness of left leg    Wears glasses     Past Surgical History:  Procedure Laterality Date   CARDIAC CATHETERIZATION  2006   LHC by Dr. Levern ORIN MEDIATE RELEASE Right 10/12/2022   Procedure: Right CARPAL TUNNEL RELEASE;  Surgeon: Gillie Duncans, MD;  Location[facility] OR;  Service: Neurosurgery;  Laterality: Right;  RM 20   CATARACT EXTRACTION Left 03/23/2024   CERVICAL DISC SURGERY     x 2    Posterior approach on both   COLONOSCOPY     EYE SURGERY Right 10/08/2019   cataract extraction at [facility]   HIP ARTHROPLASTY     right   INGUINAL HERNIA REPAIR Left 09/06/2019   Procedure: LAPAROSCOPIC LEFT INGUINAL HERNIA WITH MESH;  Surgeon: Tanda Camellia, MD;  Location: [facility];  Service: General;  Laterality: Left;   KNEE ARTHROPLASTY     bilateral   LUMBAR  LAMINECTOMY/DECOMPRESSION MICRODISCECTOMY  08/31/2011   Procedure: LUMBAR LAMINECTOMY/DECOMPRESSION MICRODISCECTOMY;  Surgeon: Lynwood JONELLE Mill, MD;  Location: [facility];  Service: Neurosurgery;  Laterality: N/A;  LumbarThree to Lumbar Five Laminectomy, possible Lumbar Four-Five Diskectomy   SHOULDER SURGERY Left    Rotator cuff repair   TOTAL HIP ARTHROPLASTY Left 04/13/2024   Procedure: ARTHROPLASTY, HIP, TOTAL, ANTERIOR APPROACH;  Surgeon: Vernetta Lonni GRADE, MD;  Location: [facility] ORS;  Service: Orthopedics;  Laterality: Left;   UPPER GI ENDOSCOPY      Prior to Admission medications    Medication Sig Start Date End Date Taking? Authorizing Provider  albuterol  (VENTOLIN  HFA) 108 (90 Base) MCG/ACT inhaler Inhale 2 puffs into the lungs every 6 (six) hours as needed for wheezing or shortness of breath. 11/04/22  Yes Olalere, Adewale A, MD  amLODipine  (NORVASC ) 10 MG tablet Take 10 mg by mouth daily. 01/09/19  Yes [provider]  buPROPion  (WELLBUTRIN  XL) 150 MG 24 hr tablet Take 150 mg by mouth daily. 01/24/24  Yes [provider]  Cholecalciferol  (VITAMIN D3) 1000 UNITS CAPS Take 1,000 Units by mouth every morning.   Yes [provider]  cyanocobalamin  (VITAMIN B12) 1000 MCG tablet Take 1,000 mcg by mouth daily.   Yes [provider]  fexofenadine (ALLEGRA) 180 MG tablet Take 180 mg by mouth daily.   Yes [provider]  fluticasone -salmeterol (WIXELA INHUB) 250-50 MCG/ACT AEPB Inhale 1 puff into the lungs in the morning and at bedtime. 11/04/22  Yes Olalere, Adewale A, MD  gabapentin  (NEURONTIN ) 400 MG capsule Take 400 mg by mouth 3 (three) times daily.   Yes [provider]  hydrOXYzine  (ATARAX /VISTARIL ) 10 MG tablet Take 20 mg by mouth at bedtime. 06/11/21  Yes [provider]  losartan  (COZAAR ) 100 MG tablet Take 100 mg by mouth every evening.   Yes [provider]  melatonin 3 MG TABS tablet Take 6 mg by mouth at bedtime.   Yes [provider]  modafinil  (PROVIGIL ) 100 MG tablet Take 200 mg (2 tablets) in the morning and 100 mg (one tablet) at noon as directed 09/13/23  Yes Olalere, Adewale A, MD  mupirocin  ointment (BACTROBAN ) 2 % Place 1 Application into the nose 2 (two) times daily for 60 doses. Use as directed 2 times daily for 5 days every other week for 6 weeks. 04/13/24 05/13/24 Yes Vernetta Lonni GRADE, MD  NON FORMULARY BIPAP at bedtime   Yes [provider]  omeprazole  (PRILOSEC  OTC) 20 MG tablet Take 1 tablet (20 mg total) by mouth daily. 01/30/20  Yes Kerman Vina HERO, NP   PARoxetine  (PAXIL ) 40 MG tablet Take 40 mg by mouth at bedtime.   Yes [provider]  Polyethyl Glycol-Propyl Glycol (SYSTANE OP) Place 1 drop into both eyes daily as needed (dry eyes).   Yes [provider]  pravastatin  (PRAVACHOL ) 40 MG tablet Take 40 mg by mouth at bedtime.   Yes [provider]  QUEtiapine  (SEROQUEL ) 25 MG tablet Take 25 mg by mouth at bedtime.   Yes [provider]  spironolactone  (ALDACTONE ) 25 MG tablet Take 50 mg by mouth daily.    Yes [provider]  tamsulosin  (FLOMAX ) 0.4 MG CAPS capsule Take 0.4 mg by mouth every morning.   Yes [provider]  Tiotropium Bromide  Monohydrate (SPIRIVA  RESPIMAT) 2.5 MCG/ACT AERS Inhale 2 puffs into the lungs daily. 01/24/24  Yes Olalere, Adewale A, MD  Turmeric 400 MG CAPS Take 400 mg by mouth daily.  Yes [provider]  vitamin E  400 UNIT capsule Take 400 Units by mouth daily.   Yes [provider]  albuterol  (PROVENTIL ) (2.5 MG/3ML) 0.083% nebulizer solution Take 3 mLs (2.5 mg total) by nebulization every 4 (four) hours as needed for wheezing or shortness of breath. Patient not taking: Reported on 05/11/2024 11/04/22   Neda Jennet LABOR, MD  azithromycin  (ZITHROMAX  Z-PAK) 250 MG tablet Take 1 tablet (250 mg total) by mouth 3 (three) times a week. Patient not taking: Reported on 05/11/2024 01/25/24   Neda Jennet LABOR, MD  bisacodyl  5 MG EC tablet Take 1 tablet (5 mg total) by mouth as directed. Patient not taking: Reported on 05/11/2024 03/26/24   Lon Klippel V, DO  cefdinir  (OMNICEF ) 300 MG capsule Take 1 capsule (300 mg total) by mouth 2 (two) times daily. Patient not taking: Reported on 05/11/2024 03/19/24   Darlean Ozell NOVAK, MD  chlorhexidine  (HIBICLENS ) 4 % external liquid Apply 15 mLs (1 Application total) topically as directed for 30 doses. Use as directed daily for 5 days every other week for 6 weeks. Patient not taking: Reported on 05/11/2024 04/13/24    Vernetta Lonni GRADE, MD  empagliflozin  (JARDIANCE ) 25 MG TABS tablet Take 12.5 mg by mouth daily.    [provider]  Ferrous Gluconate (FERGON PO) Take 45 mg by mouth daily. 08/25/21   [provider]  fluticasone  (FLONASE ) 50 MCG/ACT nasal spray Place 1 spray into both nostrils in the morning and at bedtime. Patient not taking: Reported on 05/11/2024    [provider]  hydrALAZINE  (APRESOLINE ) 50 MG tablet Take 50 mg by mouth 3 (three) times daily. Patient not taking: Reported on 05/11/2024    [provider]  metFORMIN  (GLUCOPHAGE ) 500 MG tablet Take 500 mg by mouth 2 (two) times daily with a meal.    [provider]  methocarbamol  (ROBAXIN ) 500 MG tablet Take 1 tablet (500 mg total) by mouth every 6 (six) hours as needed for muscle spasms. Patient not taking: Reported on 05/11/2024 04/14/24   Vernetta Lonni GRADE, MD  ondansetron  (ZOFRAN -ODT) 4 MG disintegrating tablet Take 1 tablet (4 mg total) by mouth every 8 (eight) hours as needed. Patient not taking: Reported on 05/11/2024 04/14/24   Vernetta Lonni GRADE, MD  oxyCODONE  (OXY IR/ROXICODONE ) 5 MG immediate release tablet Take 1-2 tablets (5-10 mg total) by mouth every 6 (six) hours as needed for moderate pain (pain score 4-6) (pain score 4-6). Patient not taking: Reported on 05/11/2024 04/26/24   Vernetta Lonni GRADE, MD  triamcinolone ointment (KENALOG) 0.1 % Apply 1 application  topically daily as needed (rash). Patient not taking: Reported on 05/11/2024    [provider]    Current Outpatient Medications  Medication Sig Dispense Refill   albuterol  (VENTOLIN  HFA) 108 (90 Base) MCG/ACT inhaler Inhale 2 puffs into the lungs every 6 (six) hours as needed for wheezing or shortness of breath. 8 g 5   amLODipine  (NORVASC ) 10 MG tablet Take 10 mg by mouth daily.     buPROPion  (WELLBUTRIN  XL) 150 MG 24 hr tablet Take 150 mg by mouth daily.     Cholecalciferol  (VITAMIN D3) 1000 UNITS  CAPS Take 1,000 Units by mouth every morning.     cyanocobalamin  (VITAMIN B12) 1000 MCG tablet Take 1,000 mcg by mouth daily.     fexofenadine (ALLEGRA) 180 MG tablet Take 180 mg by mouth daily.     fluticasone -salmeterol (WIXELA INHUB) 250-50 MCG/ACT AEPB Inhale 1 puff into the lungs  in the morning and at bedtime. 1 each 5   gabapentin  (NEURONTIN ) 400 MG capsule Take 400 mg by mouth 3 (three) times daily.     hydrOXYzine  (ATARAX /VISTARIL ) 10 MG tablet Take 20 mg by mouth at bedtime.     losartan  (COZAAR ) 100 MG tablet Take 100 mg by mouth every evening.     melatonin 3 MG TABS tablet Take 6 mg by mouth at bedtime.     modafinil  (PROVIGIL ) 100 MG tablet Take 200 mg (2 tablets) in the morning and 100 mg (one tablet) at noon as directed 90 tablet 5   mupirocin  ointment (BACTROBAN ) 2 % Place 1 Application into the nose 2 (two) times daily for 60 doses. Use as directed 2 times daily for 5 days every other week for 6 weeks. 60 g 0   NON FORMULARY BIPAP at bedtime     omeprazole  (PRILOSEC  OTC) 20 MG tablet Take 1 tablet (20 mg total) by mouth daily. 30 tablet 2   PARoxetine  (PAXIL ) 40 MG tablet Take 40 mg by mouth at bedtime.     Polyethyl Glycol-Propyl Glycol (SYSTANE OP) Place 1 drop into both eyes daily as needed (dry eyes).     pravastatin  (PRAVACHOL ) 40 MG tablet Take 40 mg by mouth at bedtime.     QUEtiapine  (SEROQUEL ) 25 MG tablet Take 25 mg by mouth at bedtime.     spironolactone  (ALDACTONE ) 25 MG tablet Take 50 mg by mouth daily.      tamsulosin  (FLOMAX ) 0.4 MG CAPS capsule Take 0.4 mg by mouth every morning.     Tiotropium Bromide  Monohydrate (SPIRIVA  RESPIMAT) 2.5 MCG/ACT AERS Inhale 2 puffs into the lungs daily. 1 g 5   Turmeric 400 MG CAPS Take 400 mg by mouth daily.     vitamin E  400 UNIT capsule Take 400 Units by mouth daily.     albuterol  (PROVENTIL ) (2.5 MG/3ML) 0.083% nebulizer solution Take 3 mLs (2.5 mg total) by nebulization every 4 (four) hours as needed for wheezing or shortness  of breath. (Patient not taking: Reported on 05/11/2024) 1080 mL 2   azithromycin  (ZITHROMAX  Z-PAK) 250 MG tablet Take 1 tablet (250 mg total) by mouth 3 (three) times a week. (Patient not taking: Reported on 05/11/2024) 12 tablet 5   bisacodyl  5 MG EC tablet Take 1 tablet (5 mg total) by mouth as directed. (Patient not taking: Reported on 05/11/2024) 4 tablet 0   cefdinir  (OMNICEF ) 300 MG capsule Take 1 capsule (300 mg total) by mouth 2 (two) times daily. (Patient not taking: Reported on 05/11/2024) 20 capsule 0   chlorhexidine  (HIBICLENS ) 4 % external liquid Apply 15 mLs (1 Application total) topically as directed for 30 doses. Use as directed daily for 5 days every other week for 6 weeks. (Patient not taking: Reported on 05/11/2024) 946 mL 1   empagliflozin  (JARDIANCE ) 25 MG TABS tablet Take 12.5 mg by mouth daily.     Ferrous Gluconate (FERGON PO) Take 45 mg by mouth daily.     fluticasone  (FLONASE ) 50 MCG/ACT nasal spray Place 1 spray into both nostrils in the morning and at bedtime. (Patient not taking: Reported on 05/11/2024)     hydrALAZINE  (APRESOLINE ) 50 MG tablet Take 50 mg by mouth 3 (three) times daily. (Patient not taking: Reported on 05/11/2024)     metFORMIN  (GLUCOPHAGE ) 500 MG tablet Take 500 mg by mouth 2 (two) times daily with a meal.     methocarbamol  (ROBAXIN ) 500 MG tablet Take 1 tablet (500 mg total)  by mouth every 6 (six) hours as needed for muscle spasms. (Patient not taking: Reported on 05/11/2024) 30 tablet 1   ondansetron  (ZOFRAN -ODT) 4 MG disintegrating tablet Take 1 tablet (4 mg total) by mouth every 8 (eight) hours as needed. (Patient not taking: Reported on 05/11/2024) 20 tablet 0   oxyCODONE  (OXY IR/ROXICODONE ) 5 MG immediate release tablet Take 1-2 tablets (5-10 mg total) by mouth every 6 (six) hours as needed for moderate pain (pain score 4-6) (pain score 4-6). (Patient not taking: Reported on 05/11/2024) 30 tablet 0   triamcinolone ointment (KENALOG) 0.1 % Apply 1 application   topically daily as needed (rash). (Patient not taking: Reported on 05/11/2024)     Current Facility-Administered Medications  Medication Dose Route Frequency Provider Last Rate Last Admin   0.9 %  sodium chloride  infusion  500 mL Intravenous Once Majestic Brister V, DO        Allergies as of 05/11/2024 - Review Complete 05/11/2024  Allergen Reaction Noted   Misc. sulfonamide containing compounds Swelling 07/23/2022   Ace inhibitors Swelling 08/27/2011   Sulfa antibiotics Swelling 08/27/2011    Family History  Problem Relation Age of Onset   Diabetes Mother    Alzheimer's disease Mother    Depression Father    ALS Father    ALS Brother    Hypertension Brother    Hypertension Brother    Cancer Brother    Colon cancer Neg Hx    Rectal cancer Neg Hx    Stomach cancer Neg Hx    Esophageal cancer Neg Hx     Social History   Socioeconomic History   Marital status: Married    Spouse name: Not on file   Number of children: 2   Years of education: 14   Highest education level: Associate degree: occupational, Scientist, product/process development, or vocational program  Occupational History   Occupation: Retired    Comment: Teaching laboratory technician  Tobacco Use   Smoking status: Never   Smokeless tobacco: Never  Vaping Use   Vaping status: Never Used  Substance and Sexual Activity   Alcohol  use: No   Drug use: No   Sexual activity: Not Currently  Other Topics Concern   Not on file  Social History Narrative   Left handed   Drinks caffeine, soda   Lives with wife   Retired   Problems with cognition, memory loss   Social Drivers of Corporate investment banker Strain: Not on file  Food Insecurity: No Food Insecurity (04/13/2024)   Hunger Vital Sign    Worried About Running Out of Food in the Last Year: Never true    Ran Out of Food in the Last Year: Never true  Transportation Needs: No Transportation Needs (04/13/2024)   PRAPARE - Administrator, Civil Service (Medical): No    Lack of Transportation  (Non-Medical): No  Physical Activity: Not on file  Stress: Not on file  Social Connections: Socially Integrated (04/13/2024)   Social Connection and Isolation Panel    Frequency of Communication with Friends and Family: Three times a week    Frequency of Social Gatherings with Friends and Family: Three times a week    Attends Religious Services: More than 4 times per year    Active Member of Clubs or Organizations: Yes    Attends Banker Meetings: More than 4 times per year    Marital Status: Married  Catering manager Violence: Not At Risk (04/13/2024)   Humiliation, Afraid, Rape, and  Kick questionnaire    Fear of Current or Ex-Partner: No    Emotionally Abused: No    Physically Abused: No    Sexually Abused: No    Physical Exam: Vital signs in last 24 hours: @BP  (!) 175/80   Pulse 62   Temp 98 F (36.7 C) (Skin)   Resp 16   Ht 6' 1.5 (1.867 m)   Wt 228 lb (103.4 kg)   SpO2 99%   BMI 29.67 kg/m  GEN: NAD EYE: Sclerae anicteric ENT: MMM CV: Non-tachycardic Pulm: CTA b/l GI: Soft, NT/ND NEURO:  Alert & Oriented x 3   Sandor Flatter, DO [facility]   05/11/2024 3:05 PM

## 2024-05-11 NOTE — Progress Notes (Signed)
Pt. states no medical or surgical changes since previsit or office visit. 

## 2024-05-14 ENCOUNTER — Telehealth: Payer: Self-pay | Admitting: *Deleted

## 2024-05-14 NOTE — Telephone Encounter (Signed)
  Follow up Call-     05/11/2024    2:29 PM  Call back number  Post procedure Call Back phone  # [phone]  Permission to leave phone message Yes     Patient questions:  Do you have a fever, pain , or abdominal swelling? No. Pain Score  0 *  Have you tolerated food without any problems? Yes.    Have you been able to return to your normal activities? Yes.    Do you have any questions about your discharge instructions: Diet   No. Medications  No. Follow up visit  No.  Do you have questions or concerns about your Care? No.  Actions: * If pain score is 4 or above: No action needed, pain <4.

## 2024-05-16 LAB — SURGICAL PATHOLOGY

## 2024-05-22 DIAGNOSIS — I119 Hypertensive heart disease without heart failure: Secondary | ICD-10-CM | POA: Diagnosis not present

## 2024-05-22 DIAGNOSIS — I2510 Atherosclerotic heart disease of native coronary artery without angina pectoris: Secondary | ICD-10-CM | POA: Diagnosis not present

## 2024-05-22 DIAGNOSIS — M50121 Cervical disc disorder at C4-C5 level with radiculopathy: Secondary | ICD-10-CM | POA: Diagnosis not present

## 2024-05-22 DIAGNOSIS — I872 Venous insufficiency (chronic) (peripheral): Secondary | ICD-10-CM | POA: Diagnosis not present

## 2024-05-22 DIAGNOSIS — E1122 Type 2 diabetes mellitus with diabetic chronic kidney disease: Secondary | ICD-10-CM | POA: Diagnosis not present

## 2024-05-22 DIAGNOSIS — G4733 Obstructive sleep apnea (adult) (pediatric): Secondary | ICD-10-CM | POA: Diagnosis not present

## 2024-05-22 DIAGNOSIS — F5221 Male erectile disorder: Secondary | ICD-10-CM | POA: Diagnosis not present

## 2024-05-22 DIAGNOSIS — J441 Chronic obstructive pulmonary disease with (acute) exacerbation: Secondary | ICD-10-CM | POA: Diagnosis not present

## 2024-05-22 DIAGNOSIS — F0671 Mild neurocognitive disorder due to known physiological condition with behavioral disturbance: Secondary | ICD-10-CM | POA: Diagnosis not present

## 2024-05-23 ENCOUNTER — Ambulatory Visit: Payer: Self-pay | Admitting: Gastroenterology

## 2024-05-24 ENCOUNTER — Encounter: Payer: Self-pay | Admitting: Orthopaedic Surgery

## 2024-05-24 ENCOUNTER — Ambulatory Visit (INDEPENDENT_AMBULATORY_CARE_PROVIDER_SITE_OTHER): Admitting: Orthopaedic Surgery

## 2024-05-24 DIAGNOSIS — Z96642 Presence of left artificial hip joint: Secondary | ICD-10-CM

## 2024-05-24 MED ORDER — TIZANIDINE HCL 4 MG PO TABS: 4.0000 mg | ORAL_TABLET | Freq: Three times a day (TID) | ORAL | 1 refills | Status: DC | PRN

## 2024-05-24 NOTE — Progress Notes (Signed)
 The patient is now 6 weeks status post a left total hip arthroplasty.  He reports a lot of low back pain and he does see a spine specialist later this month.  He says his hip is doing well but he has a harder time bending over and putting his shoes and socks on due to this back pain.  His left hip is moving smoothly and fluidly.  Incisions healed nicely.  His right hip also moves smoothly which is all remote total hip arthroplasty.  He points to the paraspinal muscles of the low back on the left side as a source of his pain.  He does have some back exercises that he is starting to do at home as well.  We talked about trying a stronger muscle relaxant so I will send in some Zanaflex  for him.  We will see him back in a month to see how he is doing overall from a hip standpoint and he does have again follow-up with a spine specialist at the end of the month.

## 2024-06-07 DIAGNOSIS — E1142 Type 2 diabetes mellitus with diabetic polyneuropathy: Secondary | ICD-10-CM | POA: Diagnosis not present

## 2024-06-08 ENCOUNTER — Other Ambulatory Visit: Payer: Self-pay | Admitting: Orthopaedic Surgery

## 2024-06-08 ENCOUNTER — Telehealth: Payer: Self-pay | Admitting: Orthopaedic Surgery

## 2024-06-08 MED ORDER — HYDROCODONE-ACETAMINOPHEN 5-325 MG PO TABS: 1.0000 | ORAL_TABLET | Freq: Four times a day (QID) | ORAL | 0 refills | Status: AC | PRN

## 2024-06-08 NOTE — Telephone Encounter (Signed)
 Patient called and said he needs a refill on the Oxycodone . The pharmacy need you to call them. CB#[phone]

## 2024-06-18 ENCOUNTER — Encounter: Payer: Self-pay | Admitting: Radiology

## 2024-06-25 ENCOUNTER — Ambulatory Visit: Admitting: Pulmonary Disease

## 2024-06-26 ENCOUNTER — Ambulatory Visit (HOSPITAL_BASED_OUTPATIENT_CLINIC_OR_DEPARTMENT_OTHER)

## 2024-06-26 ENCOUNTER — Ambulatory Visit (INDEPENDENT_AMBULATORY_CARE_PROVIDER_SITE_OTHER): Admitting: Primary Care

## 2024-06-26 ENCOUNTER — Encounter: Payer: Self-pay | Admitting: Primary Care

## 2024-06-26 VITALS — BP 122/60 | HR 68 | Temp 97.8°F | Ht 73.5 in | Wt 235.8 lb

## 2024-06-26 DIAGNOSIS — J4489 Other specified chronic obstructive pulmonary disease: Secondary | ICD-10-CM

## 2024-06-26 DIAGNOSIS — G4733 Obstructive sleep apnea (adult) (pediatric): Secondary | ICD-10-CM

## 2024-06-26 DIAGNOSIS — G47419 Narcolepsy without cataplexy: Secondary | ICD-10-CM | POA: Diagnosis not present

## 2024-06-26 DIAGNOSIS — J449 Chronic obstructive pulmonary disease, unspecified: Secondary | ICD-10-CM

## 2024-06-26 DIAGNOSIS — Z5181 Encounter for therapeutic drug level monitoring: Secondary | ICD-10-CM

## 2024-06-26 DIAGNOSIS — R051 Acute cough: Secondary | ICD-10-CM

## 2024-06-26 DIAGNOSIS — J410 Simple chronic bronchitis: Secondary | ICD-10-CM

## 2024-06-26 LAB — PULMONARY FUNCTION TEST
DL/VA % pred: 139 %
DL/VA: 5.53 ml/min/mmHg/L
DLCO unc % pred: 86 %
DLCO unc: 24.59 ml/min/mmHg
FEF 25-75 Post: 1.29 L/s
FEF 25-75 Pre: 0.91 L/s
FEF2575-%Change-Post: 42 %
FEF2575-%Pred-Post: 47 %
FEF2575-%Pred-Pre: 33 %
FEV1-%Change-Post: 11 %
FEV1-%Pred-Post: 50 %
FEV1-%Pred-Pre: 45 %
FEV1-Post: 1.85 L
FEV1-Pre: 1.66 L
FEV1FVC-%Change-Post: 7 %
FEV1FVC-%Pred-Pre: 88 %
FEV6-%Change-Post: 4 %
FEV6-%Pred-Post: 56 %
FEV6-%Pred-Pre: 54 %
FEV6-Post: 2.66 L
FEV6-Pre: 2.56 L
FEV6FVC-%Change-Post: 0 %
FEV6FVC-%Pred-Post: 105 %
FEV6FVC-%Pred-Pre: 105 %
FVC-%Change-Post: 4 %
FVC-%Pred-Post: 53 %
FVC-%Pred-Pre: 51 %
FVC-Post: 2.67 L
FVC-Pre: 2.56 L
Post FEV1/FVC ratio: 69 %
Post FEV6/FVC ratio: 100 %
Pre FEV1/FVC ratio: 65 %
Pre FEV6/FVC Ratio: 100 %
RV % pred: 197 %
RV: 5.3 L
TLC % pred: 101 %
TLC: 7.86 L

## 2024-06-26 MED ORDER — SPIRIVA RESPIMAT 2.5 MCG/ACT IN AERS: 2.0000 | INHALATION_SPRAY | Freq: Every day | RESPIRATORY_TRACT | 5 refills | Status: AC

## 2024-06-26 MED ORDER — AZITHROMYCIN 250 MG PO TABS: 250.0000 mg | ORAL_TABLET | ORAL | 5 refills | Status: DC

## 2024-06-26 MED ORDER — FLUTICASONE-SALMETEROL 500-50 MCG/ACT IN AEPB: 1.0000 | INHALATION_SPRAY | Freq: Two times a day (BID) | RESPIRATORY_TRACT | 5 refills | Status: AC

## 2024-06-26 NOTE — Progress Notes (Addendum)
 @Patient  ID: [name], male    DOB: [birth year], 73 y.o.   MRN: [id number]  No chief complaint on file.   Referring provider: [name], MD  HPI: [id number]  yobm  never smoker carries dx of asbestos last exp 1975  self referred to pulmonary clinic 03/19/2024  for cough with green sputum x 4 weeks   assoc worse doe while on zmax  three times per week       History of Present Illness  03/19/2024  Pulmonary/ ACUTE  office eval/[facility]  advair one bid / spiriva   and neb qid zpak three times per week  Chief Complaint  Patient presents with   Acute Visit    Cough with green sputum over the past 4 wks. He has had chest tightness and increased SOB x 2 wks.   Dyspnea:  worse vs baseline since onset of cough . Very sedentary at baseline Cough: worse x 4 weeks on  three times per week zmax  rx  Sleep: on BIPAP  SABA use: 3 daily neb even when feeling bettter 02 ldz:wnwz     No obvious day to day or daytime pattern/variability or assoc   mucus plugs or hemoptysis or cp or chest tightness, subjective wheeze or overt sinus or hb symptoms.    Also denies any obvious fluctuation of symptoms with weather or environmental changes or other aggravating or alleviating factors except as outlined above   No unusual exposure hx or h/o childhood pna/ asthma or knowledge of premature birth.   06/26/2024- Interim hx  Discussed the use of AI scribe software for clinical note transcription with the patient, who gave verbal consent to proceed.  History of Present Illness [name] is a 73 year old male with COPD, sleep apnea, and asbestosis who presents with persistent cough and breathing difficulties.  He has a history of COPD and sleep apnea, for which he uses a BiPAP machine nightly. His current medications include Advair, Spiriva , and azithromycin , which was added in June and taken three times a week. He also takes modafinil  twice a day for narcolepsy.  In June he was started on azithromycin  MWF,  which reduced the frequency of his cough, though he continues to produce light green mucus. His cough severity has decreased from five to four out of five.  He experiences shortness of breath, chest tightness and phlegm in his chest. Breathlessness is rated as five out of five when walking uphill or climbing stairs. His sleep is disturbed by his lung condition, rated as four out of five, and he reports low energy levels, also rated as four out of five.  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 He has previously undergone a breathing test at the [facility].  Airview download 05/26/24-06/24/24 Usage 30/30 days  Average usage 5 hours 57 minutes Pressure 18/14cm h20; PS 4 Airleak 3.5L (95%) AHI 4.5   Allergies  Allergen Reactions   Misc. Sulfonamide Containing Compounds Swelling   Ace Inhibitors Swelling   Sulfa Antibiotics Swelling    Immunization History  Administered Date(s) Administered    sv, Bivalent, Protein Subunit Rsvpref,pf (Abrysvo) 06/01/2022   Fluad Quad(high Dose 65+) 05/12/2020, 06/23/2021, 06/04/2022   Fluad Trivalent(High Dose 65+) 06/13/2018   INFLUENZA, HIGH DOSE SEASONAL PF 05/24/2017, 05/10/2019, 04/25/2023   Influenza Split 05/16/2014   Influenza,inj,Quad PF,6+ Mos 06/18/2016   Influenza-Unspecified 05/28/2003, 07/16/2004, 07/01/2005, 05/31/2006, 05/17/2007, 09/14/2007, 04/29/2008, 05/23/2009, 05/16/2010, 05/06/2011, 06/08/2012, 06/12/2013, 06/03/2015, 06/13/2018, 06/04/2022  Moderna Covid-19 Fall Seasonal Vaccine 73yrs & older 06/04/2022, 04/25/2023   Moderna Covid-19 Vaccine Bivalent Booster 8yrs & up 06/18/2021   Moderna Sars-Covid-2 Vaccination 11/05/2019, 12/03/2019, 08/25/2020   Pneumococcal Conjugate-13 12/22/2016   Pneumococcal Polysaccharide-23 05/10/2019   Pneumococcal-Unspecified 05/28/2003   RSV,unspecified 06/01/2022   Tdap 06/08/2012, 04/28/2022   Zoster Recombinant(Shingrix) 05/12/2020, 10/30/2020   Zoster,  Live 01/02/2014    Past Medical History:  Diagnosis Date   Abnormal CT scan of lung 07/23/2021   Abnormality of gait    Allergic rhinitis due to pollen 11/25/2010   Bilateral primary osteoarthritis of hip 12/12/2019   Carpal tunnel syndrome on right    Chronic kidney disease    Complication of anesthesia    Hard to wake up, has narcolepsy   Contact with and (suspected) exposure to asbestos 07/23/2021   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   Hyperlipidemia    Hypothyroidism    Klippel-Feil sequence    Learning disability, unspecified 1997   Reading/reading comprehension   Left inguinal hernia    Low back pain 12/12/2019   Lumbar spondylosis    Major depressive disorder    Mild cognitive impairment 10/18/2022   Nonspecific abnormal electrocardiogram (ECG) (EKG)    Obstructive sleep apnea 12/15/2015   BiPAP   Osteoarthritis    Osteoporosis    Pain in joints of right hand    Peripheral neuropathy    Peripheral venous insufficiency    Pituitary abnormality    pituitary edema; takes bromocriptine    PTSD (post-traumatic stress disorder) 11/25/2010   Spinal stenosis of lumbar region 09/14/2018   Type 2 diabetes mellitus without complications 11/25/2010   Vitamin D deficiency    Weakness of left leg    Wears glasses     Tobacco History: Social History   Tobacco Use  Smoking Status Never  Smokeless Tobacco Never   Counseling given: Not Answered   Outpatient Medications Prior to Visit  Medication Sig Dispense Refill    albuterol  (PROVENTIL ) (2.5 MG/3ML) 0.083% nebulizer solution Take 3 mLs (2.5 mg total) by nebulization every 4 (four) hours as needed for wheezing or shortness of breath. (Patient not taking: Reported on 05/11/2024) 1080 mL 2   albuterol  (VENTOLIN  HFA) 108 (90 Base) MCG/ACT inhaler Inhale 2 puffs into the lungs every 6 (six) hours as needed for wheezing or shortness of breath. 8 g 5   amLODipine  (NORVASC ) 10 MG tablet Take 10 mg by mouth daily.     azithromycin  (ZITHROMAX  Z-PAK) 250 MG tablet Take 1 tablet (250 mg total) by mouth 3 (three) times a week. (Patient not taking: Reported on 05/11/2024) 12 tablet 5   bisacodyl  5 MG EC tablet Take 1 tablet (5 mg total) by mouth as directed. (Patient not taking: Reported on 05/11/2024) 4 tablet 0   buPROPion  (WELLBUTRIN  XL) 150 MG 24 hr tablet Take 150 mg by mouth daily.     cefdinir  (OMNICEF ) 300 MG capsule Take 1 capsule (300 mg total) by mouth 2 (two) times daily. (Patient not taking: Reported on 05/11/2024) 20 capsule 0  chlorhexidine  (HIBICLENS ) 4 % external liquid Apply 15 mLs (1 Application total) topically as directed for 30 doses. Use as directed daily for 5 days every other week for 6 weeks. (Patient not taking: Reported on 05/11/2024) 946 mL 1   Cholecalciferol  (VITAMIN D3) 1000 UNITS CAPS Take 1,000 Units by mouth every morning.     cyanocobalamin  (VITAMIN B12) 1000 MCG tablet Take 1,000 mcg by mouth daily.     empagliflozin  (JARDIANCE ) 25 MG TABS tablet Take 12.5 mg by mouth daily.     Ferrous Gluconate (FERGON PO) Take 45 mg by mouth daily.     fexofenadine (ALLEGRA) 180 MG tablet Take 180 mg by mouth daily.     fluticasone  (FLONASE ) 50 MCG/ACT nasal spray Place 1 spray into both nostrils in the morning and at bedtime. (Patient not taking: Reported on 05/11/2024)     fluticasone -salmeterol (WIXELA INHUB) 250-50 MCG/ACT AEPB Inhale 1 puff into the lungs in the morning and at bedtime. 1 each 5   gabapentin  (NEURONTIN ) 400 MG capsule Take 400 mg by  mouth 3 (three) times daily.     hydrALAZINE  (APRESOLINE ) 50 MG tablet Take 50 mg by mouth 3 (three) times daily. (Patient not taking: Reported on 05/11/2024)     HYDROcodone -acetaminophen  (NORCO/VICODIN) 5-325 MG tablet Take 1 tablet by mouth every 6 (six) hours as needed for moderate pain (pain score 4-6). 30 tablet 0   hydrOXYzine  (ATARAX /VISTARIL ) 10 MG tablet Take 20 mg by mouth at bedtime.     losartan  (COZAAR ) 100 MG tablet Take 100 mg by mouth every evening.     melatonin 3 MG TABS tablet Take 6 mg by mouth at bedtime.     metFORMIN  (GLUCOPHAGE ) 500 MG tablet Take 500 mg by mouth 2 (two) times daily with a meal.     methocarbamol  (ROBAXIN ) 500 MG tablet Take 1 tablet (500 mg total) by mouth every 6 (six) hours as needed for muscle spasms. (Patient not taking: Reported on 05/11/2024) 30 tablet 1   modafinil  (PROVIGIL ) 100 MG tablet Take 200 mg (2 tablets) in the morning and 100 mg (one tablet) at noon as directed 90 tablet 5   NON FORMULARY BIPAP at bedtime     omeprazole  (PRILOSEC  OTC) 20 MG tablet Take 1 tablet (20 mg total) by mouth daily. 30 tablet 2   ondansetron  (ZOFRAN -ODT) 4 MG disintegrating tablet Take 1 tablet (4 mg total) by mouth every 8 (eight) hours as needed. (Patient not taking: Reported on 05/11/2024) 20 tablet 0   PARoxetine  (PAXIL ) 40 MG tablet Take 40 mg by mouth at bedtime.     Polyethyl Glycol-Propyl Glycol (SYSTANE OP) Place 1 drop into both eyes daily as needed (dry eyes).     pravastatin  (PRAVACHOL ) 40 MG tablet Take 40 mg by mouth at bedtime.     QUEtiapine  (SEROQUEL ) 25 MG tablet Take 25 mg by mouth at bedtime.     spironolactone  (ALDACTONE ) 25 MG tablet Take 50 mg by mouth daily.      tamsulosin  (FLOMAX ) 0.4 MG CAPS capsule Take 0.4 mg by mouth every morning.     Tiotropium Bromide  Monohydrate (SPIRIVA  RESPIMAT) 2.5 MCG/ACT AERS Inhale 2 puffs into the lungs daily. 1 g 5   tiZANidine  (ZANAFLEX ) 4 MG tablet Take 1 tablet (4 mg total) by mouth every 8 (eight) hours  as needed for muscle spasms. 30 tablet 1   triamcinolone ointment (KENALOG) 0.1 % Apply 1 application  topically daily as needed (rash). (Patient not taking: Reported on 05/11/2024)  Turmeric 400 MG CAPS Take 400 mg by mouth daily.     vitamin E  400 UNIT capsule Take 400 Units by mouth daily.     Facility-Administered Medications Prior to Visit  Medication Dose Route Frequency Provider Last Rate Last Admin   0.9 %  sodium chloride  infusion  500 mL Intravenous Once Cirigliano, Vito V, DO        Review of Systems  Review of Systems  Constitutional: Negative.   Respiratory:  Positive for cough, shortness of breath and wheezing.    Physical Exam  There were no vitals taken for this visit. Physical Exam Constitutional:      Appearance: Normal appearance. He is well-developed.  HENT:     Head: Normocephalic and atraumatic.     Mouth/Throat:     Mouth: Mucous membranes are moist.     Pharynx: Oropharynx is clear.  Cardiovascular:     Rate and Rhythm: Normal rate and regular rhythm.     Heart sounds: Normal heart sounds.  Pulmonary:     Effort: Pulmonary effort is normal. No respiratory distress.     Breath sounds: Normal breath sounds. No wheezing or rhonchi.  Musculoskeletal:        General: Normal range of motion.     Cervical back: Normal range of motion and neck supple.  Skin:    General: Skin is warm and dry.     Findings: No erythema or rash.  Neurological:     General: No focal deficit present.     Mental Status: He is alert and oriented to person, place, and time. Mental status is at baseline.  Psychiatric:        Mood and Affect: Mood normal.        Behavior: Behavior normal.        Thought Content: Thought content normal.        Judgment: Judgment normal.     Lab Results:  CBC    Component Value Date/Time   WBC 9.7 04/15/2024 0409   RBC 4.55 04/15/2024 0409   HGB 10.5 (L) 04/15/2024 0409   HCT 34.3 (L) 04/15/2024 0409   PLT 230 04/15/2024 0409   MCV  75.4 (L) 04/15/2024 0409   MCH 23.1 (L) 04/15/2024 0409   MCHC 30.6 04/15/2024 0409   RDW 15.4 04/15/2024 0409   LYMPHSABS 1.5 07/23/2021 1027   MONOABS 0.5 07/23/2021 1027   EOSABS 0.3 07/23/2021 1027   BASOSABS 0.1 07/23/2021 1027    BMET    Component Value Date/Time   NA 136 04/14/2024 0420   NA 142 08/13/2022 1017   K 4.7 04/14/2024 0420   CL 102 04/14/2024 0420   CO2 23 04/14/2024 0420   GLUCOSE 128 (H) 04/14/2024 0420   BUN 26 (H) 04/14/2024 0420   BUN 17 08/13/2022 1017   CREATININE 1.71 (H) 04/14/2024 0420   CALCIUM 8.7 (L) 04/14/2024 0420   GFRNONAA 42 (L) 04/14/2024 0420   GFRAA 59 (L) 09/03/2019 1016    BNP No results found for: BNP  ProBNP No results found for: PROBNP  Imaging: No results found.   Assessment & Plan:   1. 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 FEV1 is 50% with 11% improvement post-albuterol , indicating reversibility. Midflows improved by 42% post-albuterol . Persistent productive cough with light green mucus, rated 4/5 in severity. Current treatment includes Advair (medium dose), Spiriva , and azithromycin  three times a week.  Consideration of increasing Advair to high dose due to asthmatic features and reversibility. Potential future consideration of biologic therapy if symptoms persist despite increased inhaler dose. - Increased Advair to high dose (500-50 micrograms) two puffs q 12 hours  - Continue azithromycin  250mcg three times a week. Checking EKG to monitor QT interval  - Patient was unable to complete FENO test to assess airway inflammation. Checking CBC with diff and IGE - Will consider addition of ohtuvayre  (PDE4 inhibitor) vs biologic therapy if symptoms persist   Obstructive sleep apnea Managed with BiPAP. BiPAP usage is 100% compliance with an average of 5 hours and 55 minutes per night. Pressure  settings are 18/14 with an apnea score of 4.4, indicating effective management. - Continue BiPAP therapy.  Narcolepsy Managed with modafinil , taken twice daily. - Continue modafinil  twice daily.  I personally spent a total of 40 minutes in the care of the patient today including performing a medically appropriate exam/evaluation, placing orders, documenting clinical information in the EHR, independently interpreting results, communicating results, and coordinating care.   Almarie LELON Ferrari, NP 06/26/2024

## 2024-06-26 NOTE — Patient Instructions (Signed)
 Full PFT performed today.

## 2024-06-26 NOTE — Progress Notes (Signed)
 Full PFT performed today.

## 2024-06-26 NOTE — Patient Instructions (Addendum)
  VISIT SUMMARY: Today, we discussed your ongoing issues with chronic obstructive pulmonary disease (COPD), sleep apnea, and narcolepsy. We reviewed your current medications and made some adjustments to better manage your symptoms.  YOUR PLAN: -CHRONIC OBSTRUCTIVE PULMONARY DISEASE (COPD) WITH ASTHMATIC FEATURES: COPD is a chronic lung disease that makes it hard to breathe. Your condition has asthmatic features, meaning there is some reversibility in your symptoms with medication. We have increased your Advair to a higher dose (500 micrograms) to help manage your symptoms better. You should continue taking azithromycin  three times a week. We have also ordered a FENO test to check for airway inflammation. If your symptoms do not improve, we may consider biologic therapy in the future.  -OBSTRUCTIVE SLEEP APNEA: Obstructive sleep apnea is a condition where your breathing stops and starts during sleep. You are managing this well with your BiPAP machine, which you use every night. Continue using your BiPAP as you have been.  -NARCOLEPSY: Narcolepsy is a sleep disorder that causes excessive daytime sleepiness. You are currently managing this with modafinil , which you take twice daily. Continue taking your modafinil  as prescribed.  INSTRUCTIONS: Please continue with your current medications and the increased dose of Advair. Make sure to take azithromycin  three times a week and use your BiPAP machine every night. We have ordered a FENO test to assess airway inflammation, and we will consider biologic therapy if your symptoms do not improve. Follow up with us  if you experience any new or worsening symptoms.  Orders: EKG re: medication monitoring FENO re: cough  Labs cbc with diff and IGE  Follow-up: 3 months with Dr. Neda or sooner if needed

## 2024-06-27 ENCOUNTER — Encounter: Payer: Self-pay | Admitting: Orthopaedic Surgery

## 2024-06-27 ENCOUNTER — Ambulatory Visit (INDEPENDENT_AMBULATORY_CARE_PROVIDER_SITE_OTHER): Admitting: Orthopaedic Surgery

## 2024-06-27 DIAGNOSIS — Z96642 Presence of left artificial hip joint: Secondary | ICD-10-CM

## 2024-06-27 LAB — CBC WITH DIFFERENTIAL/PLATELET
Basophils Absolute: 0.1 K/uL (ref 0.0–0.1)
Basophils Relative: 0.8 % (ref 0.0–3.0)
Eosinophils Absolute: 0.5 K/uL (ref 0.0–0.7)
Eosinophils Relative: 5.9 % — ABNORMAL HIGH (ref 0.0–5.0)
HCT: 39.1 % (ref 39.0–52.0)
Hemoglobin: 12.5 g/dL — ABNORMAL LOW (ref 13.0–17.0)
Lymphocytes Relative: 27.1 % (ref 12.0–46.0)
Lymphs Abs: 2.4 K/uL (ref 0.7–4.0)
MCHC: 31.9 g/dL (ref 30.0–36.0)
MCV: 74 fl — ABNORMAL LOW (ref 78.0–100.0)
Monocytes Absolute: 0.7 K/uL (ref 0.1–1.0)
Monocytes Relative: 7.7 % (ref 3.0–12.0)
Neutro Abs: 5.1 K/uL (ref 1.4–7.7)
Neutrophils Relative %: 58.5 % (ref 43.0–77.0)
Platelets: 304 K/uL (ref 150.0–400.0)
RBC: 5.28 Mil/uL (ref 4.22–5.81)
RDW: 16 % — ABNORMAL HIGH (ref 11.5–15.5)
WBC: 8.7 K/uL (ref 4.0–10.5)

## 2024-06-27 LAB — IGE: IgE (Immunoglobulin E), Serum: 256 kU/L — ABNORMAL HIGH (ref <=114)

## 2024-06-27 NOTE — Progress Notes (Signed)
 The patient is a 73 year old who is 2-1/24-month status post a left total hip arthroplasty.  He does ambulate using a cane but he is doing well overall.  He does report that he has an injection coming up for his lumbar spine and therapy on his back.  He says the hip is doing very well.  His left hip does move smoothly and fluidly no blocks or rotation.  He will continue to increase his activities as comfort allows.  There is no restrictions for his left hip even from a therapy standpoint.  The next time we need to see him is not for 6 months unless there is any issues.  Will have a standing AP pelvis and lateral of the left hip at that visit.

## 2024-07-03 ENCOUNTER — Ambulatory Visit: Admitting: Physical Therapy

## 2024-07-09 ENCOUNTER — Ambulatory Visit: Attending: Neurosurgery

## 2024-07-09 DIAGNOSIS — R262 Difficulty in walking, not elsewhere classified: Secondary | ICD-10-CM | POA: Diagnosis present

## 2024-07-09 DIAGNOSIS — M5459 Other low back pain: Secondary | ICD-10-CM | POA: Insufficient documentation

## 2024-07-09 NOTE — Therapy (Signed)
 OUTPATIENT PHYSICAL THERAPY EVALUATION   Patient Name: [name] MRN: [id number] DOB:1951-06-16, 73 y.o., male Today's Date: 07/09/2024  END OF SESSION:  PT End of Session - 07/09/24 1057     Visit Number 1    Number of Visits 15    Date for Recertification  09/03/24    Authorization Type [facility]    Authorization Time Period 01/16/24 -08/18/24    Authorization - Visit Number 1    Authorization - Number of Visits 15    PT Start Time 1049    PT Stop Time 1127    PT Time Calculation (min) 38 min          Past Medical History:  Diagnosis Date   Abnormal CT scan of lung 07/23/2021   Abnormality of gait    Allergic rhinitis due to pollen 11/25/2010   Bilateral primary osteoarthritis of hip 12/12/2019   Carpal tunnel syndrome on right    Chronic kidney disease    Complication of anesthesia    Hard to wake up, has narcolepsy   Contact with and (suspected) exposure to asbestos 07/23/2021   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   Hyperlipidemia    Hypothyroidism    Klippel-Feil sequence    Learning disability, unspecified 1997   Reading/reading comprehension   Left inguinal hernia    Low back pain 12/12/2019   Lumbar spondylosis    Major depressive disorder    Mild cognitive impairment 10/18/2022   Nonspecific abnormal electrocardiogram (ECG) (EKG)    Obstructive sleep apnea 12/15/2015   BiPAP   Osteoarthritis    Osteoporosis    Pain in joints of right hand    Peripheral neuropathy    Peripheral venous insufficiency     Pituitary abnormality    pituitary edema; takes bromocriptine    PTSD (post-traumatic stress disorder) 11/25/2010   Spinal stenosis of lumbar region 09/14/2018   Type 2 diabetes mellitus without complications 11/25/2010   Vitamin D deficiency    Weakness of left leg    Wears glasses    Past Surgical History:  Procedure Laterality Date   CARDIAC CATHETERIZATION  2006   LHC by Dr. Levern ORIN MEDIATE RELEASE Right 10/12/2022   Procedure: Right CARPAL TUNNEL RELEASE;  Surgeon: Gillie Duncans, MD;  Location[facility];  Service: Neurosurgery;  Laterality: Right;  RM 20   CATARACT EXTRACTION Left 03/23/2024   CERVICAL DISC SURGERY     x 2    Posterior approach on both   COLONOSCOPY     EYE SURGERY Right 10/08/2019   cataract extraction at [facility]   HIP ARTHROPLASTY     right   INGUINAL HERNIA REPAIR Left 09/06/2019   Procedure: LAPAROSCOPIC LEFT INGUINAL HERNIA WITH MESH;  Surgeon: Tanda Locus, MD;  Location: [facility];  Service: General;  Laterality: Left;   KNEE ARTHROPLASTY     bilateral   LUMBAR LAMINECTOMY/DECOMPRESSION MICRODISCECTOMY  08/31/2011   Procedure: LUMBAR LAMINECTOMY/DECOMPRESSION MICRODISCECTOMY;  Surgeon: Lynwood JONELLE Mill, MD;  Location: [facility];  Service: Neurosurgery;  Laterality: N/A;  LumbarThree to Lumbar Five Laminectomy, possible Lumbar Four-Five Diskectomy   SHOULDER SURGERY Left    Rotator cuff repair   TOTAL HIP ARTHROPLASTY Left 04/13/2024   Procedure: ARTHROPLASTY, HIP, TOTAL, ANTERIOR APPROACH;  Surgeon: Vernetta Lonni GRADE, MD;  Location: [facility] OR[facility];  Service: Orthopedics;  Laterality: Left;   UPPER GI ENDOSCOPY     Patient Active Problem List   Diagnosis Date Noted   Status post total replacement of left hip 04/13/2024   Acute bronchitis 03/19/2024   Pseudophakia 10/18/2022   Generalized anxiety disorder 10/18/2022   GERD (gastroesophageal reflux disease) 10/18/2022   Osteoporosis 10/18/2022   Peripheral neuropathy 10/18/2022    Mild cognitive impairment 10/18/2022   Major depressive disorder    Osteoarthritis    Abnormality of gait    Coronary artery disease    Degeneration of cervical intervertebral disc    Lumbar spondylosis    History of total knee arthroplasty    Hyperlipidemia    Hypothyroidism    Klippel-Feil sequence    Dystrophia unguium    Pain in joints of right hand    Peripheral venous insufficiency    Vitamin D deficiency    Abnormal CT scan of lung 07/23/2021   COPD (chronic obstructive pulmonary disease) 07/23/2021   Contact with and (suspected) exposure to asbestos 07/23/2021   Dyspnea on exertion    Nonspecific abnormal electrocardiogram (ECG) (EKG)    Bilateral primary osteoarthritis of hip 12/12/2019   Low back pain 12/12/2019   Spinal stenosis of lumbar region 09/14/2018   Obstructive sleep apnea 12/15/2015   Controlled narcolepsy 12/15/2015   Essential (primary) hypertension 11/25/2010   Type 2 diabetes mellitus without complications 11/25/2010   PTSD (post-traumatic stress disorder) 11/25/2010   Degenerative joint disease involving multiple joints 11/25/2010   Allergic rhinitis due to pollen 11/25/2010   Learning disability, unspecified 1997    PCP: [name], MD  REFERRING PROVIDER: Gillie Duncans, MD   REFERRING DIAG:  M47.16 (ICD-10-CM) - Osteoarthritis of spine with myelopathy, lumbar region  Gait and back pain   Rationale for Evaluation and Treatment: Rehabilitation  THERAPY DIAG:  Other low back pain  Difficulty in walking, not elsewhere classified  ONSET DATE: several years  SUBJECTIVE:                                                                                                                                                                                           SUBJECTIVE STATEMENT: Patient presents to PT with lumbar pain that has been present for several years. He had L THA 3 months ago which has helped with his hip pain,  but his back continues to  be painful. He would like to avoid surgery, and had recent L5-S1 injections. He states the injection did help, but he continues to have pain with bending and prolonged bending. He states that he continues utilize [facility] outside of the home, or when he is with his 39 y.o. grandson.  He ambulates with BIL AFO, which he's used for a long time.    PERTINENT HISTORY:  Relevant PMHx includes BIL hip OA, CKD, COPD, controlled narcolepsy, dyspnea on exertion, GAD, GERD, hypothyroidism, major depressive disorder, mild cognitive impairment, osteoporosis, peripheral neuropathy, peripheral venous insufficiency, PTSD, lumbar stenosis, Vitamin D deficiency, R THA, BIL TKA, LTHA (04/13/2024)  PAIN:  Are you having pain?  Yes: NPRS scale: 7/10 current, 10/10 at worst  Pain location: LBP  Pain description: stiff, throbbing, sharp  Aggravating factors: prolonged sitting, bending forward  Relieving factors: stay moving  PRECAUTIONS: None  RED FLAGS: None   WEIGHT BEARING RESTRICTIONS: No  FALLS:  Has patient fallen in last 6 months? No  LIVING ENVIRONMENT: Lives with: lives with their spouse Lives in: House/apartment Stairs: No Has following equipment at home: Single point cane and Grab bars  OCCUPATION: retired;   PLOF: Independent and Leisure: yardwork (including development worker, international aid), babysitting  PATIENT GOALS: to be able to stoop, bend down and pick myself back up, be able to do my exercises  NEXT MD VISIT: 09/27/23 with pulmonology, 12/26/24 ortho   OBJECTIVE:  Note: Objective measures were completed at Evaluation unless otherwise noted.  DIAGNOSTIC FINDINGS:  Per referral MRI shows significant facet arthopathy at L5-S1 on both the right and left sides. No significant stenosis.   PATIENT SURVEYS:  Modified Oswestry:  MODIFIED OSWESTRY DISABILITY SCALE  Date: 07/09/2024  Score  Pain intensity 1 = The pain is bad, but I can manage without having to take pain medication  2. Personal care  (washing, dressing, etc.) 3 =  I need help, but I am able to manage most of my personal care.  3. Lifting 3 = Pain prevents me from lifting heavy weights, but I can manage light to medium weights if they are conveniently positioned  4. Walking 1 = Pain prevents me from walking more than 1 mile.  5. Sitting 2 =  Pain prevents me from sitting more than 1 hour.  6. Standing 5 =  Pain prevents me from standing at all  7. Sleeping 1 = I can sleep well only by using pain medication.  8. Social Life 2 = Pain prevents me from participating in more energetic activities (eg. sports, dancing).  9. Traveling 5 = My pain prevents all travel except for visits to the physician/therapist or hospital  10. Employment/ Homemaking 2 = I can perform most of my homemaking/job duties, but pain prevents me from performing more physically stressful activities (eg, lifting, vacuuming).  Total 24/50   Interpretation of scores: Score Category Description  0-20% Minimal Disability The patient can cope with most living activities. Usually no treatment is indicated apart from advice on lifting, sitting and exercise  21-40% Moderate Disability The patient experiences more pain and difficulty with sitting, lifting and standing. Travel and social life are more difficult and they may be disabled from work. Personal care, sexual activity and sleeping are not grossly affected, and the patient can usually be managed by conservative means  41-60% Severe Disability Pain remains the main problem in this group, but activities of daily living are affected. These patients require a detailed investigation  61-80% Crippled Back pain impinges on all aspects of the patient's life. Positive intervention is required  81-100% Bed-bound These patients are either bed-bound or exaggerating their symptoms  Bluford FORBES Zoe DELENA Karon DELENA, et al. Surgery versus conservative management of stable thoracolumbar fracture: the PRESTO feasibility RCT.  [place] ([place]): [employer]; 2021 Nov. [facility] Technology Assessment, No. 25.62.) Appendix 3, Oswestry Disability Index category descriptors. Available from: Findjewelers.cz  Minimally Clinically Important Difference (MCID) = 12.8%  COGNITION: Overall cognitive status: No family/caregiver present to determine baseline cognitive functioning     SENSATION: Not tested   POSTURE: rounded shoulders, forward head, decreased lumbar lordosis, posterior pelvic tilt, and flexed trunk   PALPATION: BIL moderate tenderness to palpation along lumbar spina and paraspinals. L QL > R QL   LUMBAR ROM:   AROM eval  Flexion 75%  Extension 25%  Right lateral flexion 50%  Left lateral flexion 75%  Right rotation   Left rotation    (Blank rows = not tested)  Abdominal strength grossly 3/5   LOWER EXTREMITY ROM:     Active  Right eval Left eval  Hip flexion    Hip extension    Hip abduction    Hip adduction    Hip internal rotation    Hip external rotation    Knee flexion    Knee extension    Ankle dorsiflexion    Ankle plantarflexion    Ankle inversion    Ankle eversion     (Blank rows = not tested)  LOWER EXTREMITY MMT:    MMT Right eval Left eval  Hip flexion    Hip extension    Hip abduction    Hip adduction    Hip internal rotation    Hip external rotation    Knee flexion    Knee extension    Ankle dorsiflexion    Ankle plantarflexion    Ankle inversion    Ankle eversion     (Blank rows = not tested)  LUMBAR SPECIAL TESTS:  Straight leg raise test: Negative  FUNCTIONAL TESTS:  30 seconds chair stand test: 9 repetitions  GAIT: Distance walked: within clinic  Assistive device utilized: Single point cane and BIL AFO  Level of assistance: Modified independence Comments: forward flexed posture, thoracic kyphosis, decreased trunk rotation and limited PF access    TREATMENT DATE:   [facility] Adult PT Treatment:                                                 DATE: 07/09/2024   Initial evaluation: see patient education and home exercise program as noted below  PATIENT EDUCATION:  Education details: reviewed initial home exercise program; discussion of POC, prognosis and goals for skilled PT   Person educated: Patient Education method: Explanation, Demonstration, and Handouts Education comprehension: verbalized understanding, returned demonstration, and needs further education  HOME EXERCISE PROGRAM: Access Code: [email or web address] URL: [email or web address] Date: 07/09/2024 Prepared by: Marko Molt  Exercises - Seated Thoracic Lumbar Extension  - 1 x daily - 7 x weekly - 1-2 sets - 10 reps - 3 sec hold - Seated Sidebending  - 1 x daily - 7 x weekly - 1-2 sets - 10 reps - 3 sec hold - Seated Thoracic Flexion and Rotation with Arms Crossed  - 1 x daily - 7 x weekly - 1-2 sets - 10 reps - 3 sec hold  ASSESSMENT:  CLINICAL IMPRESSION: [name] is a 73 y.o. male who was seen today for physical therapy evaluation and treatment for ***. He is demonstrating ***. He has related pain and difficulty with ***. He requires skilled PT services at this time to address relevant deficits and improve overall function.     OBJECTIVE IMPAIRMENTS: {opptimpairments:25111}.   ACTIVITY LIMITATIONS: {activitylimitations:27494}  PARTICIPATION LIMITATIONS: {participationrestrictions:25113}  PERSONAL FACTORS: {Personal factors:25162} are also affecting patient's functional outcome.   REHAB POTENTIAL: {rehabpotential:25112}  CLINICAL DECISION MAKING: {clinical decision making:25114}  EVALUATION COMPLEXITY: {Evaluation complexity:25115}   GOALS: Goals reviewed with patient? YES  SHORT TERM GOALS: Target date: 08/06/2024   Patient will be independent with initial home program at least 3  days/week.  Baseline: provided at eval Goal Status: INITIAL   2.  Patient will demonstrate improved postural awareness for at least 15 minutes while seated without need for cueing from PT.  Baseline: see objective measures Goal Status: INITIAL   3.  Patient will have improved LS AROM to at least 80% of full ROM with no more than minimal pain.  Baseline: see objective measures  Goal status: INITIAL   LONG TERM GOALS: Target date: 09/03/2024   Patient will report improved overall functional ability with ODI score of 12/50 or less.  Baseline: 24/50 Goal Status: INITIAL   2.  Patient will perform at least 12 STS in 30 seconds.  Baseline: 9 repetitions  Goal status: INITIAL  3.  *** Baseline:  Goal status: INITIAL  4.  *** Baseline:  Goal status: INITIAL    PLAN:  PT FREQUENCY: 1-2x/week  PT DURATION: 8 weeks  PLANNED INTERVENTIONS: 97164- PT Re-evaluation, 97110-Therapeutic exercises, 97530- Therapeutic activity, [id number]- Neuromuscular re-education, 97535- Self Care, 02859- Manual therapy, [id number]- Electrical stimulation (unattended), 02987- Traction (mechanical), 20560 (1-2 muscles), 20561 (3+ muscles)- Dry Needling, Patient/Family education, Taping, Spinal mobilization, Cryotherapy, and Moist heat.  PLAN FOR NEXT SESSION: PIERRETTE Marko Molt, PT, DPT  07/09/2024 9:27 PM

## 2024-07-18 ENCOUNTER — Ambulatory Visit: Attending: Cardiology | Admitting: Cardiology

## 2024-07-18 ENCOUNTER — Encounter: Payer: Self-pay | Admitting: Cardiology

## 2024-07-18 VITALS — BP 168/67 | HR 73 | Resp 16 | Ht 73.0 in | Wt 236.0 lb

## 2024-07-18 DIAGNOSIS — E1140 Type 2 diabetes mellitus with diabetic neuropathy, unspecified: Secondary | ICD-10-CM | POA: Diagnosis not present

## 2024-07-18 DIAGNOSIS — E6609 Other obesity due to excess calories: Secondary | ICD-10-CM

## 2024-07-18 DIAGNOSIS — G4733 Obstructive sleep apnea (adult) (pediatric): Secondary | ICD-10-CM | POA: Diagnosis not present

## 2024-07-18 DIAGNOSIS — E782 Mixed hyperlipidemia: Secondary | ICD-10-CM

## 2024-07-18 DIAGNOSIS — I2510 Atherosclerotic heart disease of native coronary artery without angina pectoris: Secondary | ICD-10-CM | POA: Diagnosis not present

## 2024-07-18 DIAGNOSIS — E66811 Obesity, class 1: Secondary | ICD-10-CM

## 2024-07-18 DIAGNOSIS — Z6831 Body mass index (BMI) 31.0-31.9, adult: Secondary | ICD-10-CM | POA: Diagnosis not present

## 2024-07-18 DIAGNOSIS — I10 Essential (primary) hypertension: Secondary | ICD-10-CM | POA: Diagnosis not present

## 2024-07-18 NOTE — Patient Instructions (Addendum)
 Medication Instructions:  Change: Hydralazine  (Apresoline ) to 2 tablets (100 mg total) two times daily (once in the morning, once in the evening)  Please take your Amlodipine  and Losartan  at bedtime/in evening  Please take your Jardiance  and Aldactone  in the mornings   Follow-Up: At [facility], you and your health needs are our priority.  As part of our continuing mission to provide you with exceptional heart care, our providers are all part of one team.  This team includes your primary Cardiologist (physician) and Advanced Practice Providers or APPs (Physician Assistants and Nurse Practitioners) who all work together to provide you with the care you need, when you need it.  Your next appointment:   1 year(s) OR AS NEEDED (Please call in October 2026 for a December 2026 appointment)  Provider:   Madonna Large, DO  Other Instructions Please call us  or send a MyChart message with any Cardiology related questions/concerns.  [phone].  Thank you!

## 2024-07-18 NOTE — Progress Notes (Signed)
 Cardiology Office Note:  .   Date:  07/18/2024  ID:  [name], DOB 1951-01-17, MRN [id number] PCP:  [name], MD ( also sees Dr. Volanda at [facility]).  Former Cardiology Providers: Dr. Lever[facility] Providers Cardiologist:  Madonna Large, DO , [facility] (established care 07/23/2022) Electrophysiologist:  None  Click to update primary MD,subspecialty MD or APP then REFRESH:1}    Chief Complaint  Patient presents with   Nonobstructive atherosclerosis of coronary artery   Follow-up    History of Present Illness: .   [name] is a 73 y.o. African-American male whose past medical history and cardiovascular risk factors includes: Nonobstructive coronary artery disease, hypertension, hyperlipidemia, non-insulin -dependent diabetes mellitus type 2,  OSA on BiPAP, advanced age, obesity.   Referred to the practice for dyspnea on exertion evaluation in April 2021. He underwent coronary CTA which noted mild CAC and CAD RADS 2 disease and CT FFR was negative for hemodynamically significant stenosis.  Since last office visit Mr. [name] denies any anginal chest pain or heart failure symptoms.   No hospitalizations or urgent care visits for cardiovascular reasons.   He has been compliant with his medical therapy.   Has lost 5# since the last visit.  Physical endurance remains stable, limited due to back pain and recovering from hip surgery .  Home SBP ranges between 120-122mmHg. Takes most of the medications in the morning.   Review of Systems: .   Review of Systems  Cardiovascular:  Negative for chest pain, claudication, irregular heartbeat, leg swelling, near-syncope, orthopnea, palpitations, paroxysmal nocturnal dyspnea and syncope.  Respiratory:  Negative for shortness of breath.   Hematologic/Lymphatic: Negative for bleeding problem.    Studies Reviewed:   EKG: 06/26/2024: NSR 64bpm, LAD.   Echocardiogram: 12/18/2019: LVEF 55-60%, mild LVH, grade 2  diastolic impairment, mildly dilated left atrium, slightly dilated right atrium, mild MR, mild TR.   Stress Testing: Lexiscan/modified Bruce Tetrofosmin stress test 12/24/2019: Normal stress myocardial perfusion. Stress LVEF calculated 46%, although visually appears normal.   CCTA 06/16/2021: 1. Total coronary calcium score of 89.6. This was 65th percentile  for age and sex matched control.   2. Normal coronary origin with right dominance.   3. CAD-RADS = 2 Mild non-obstructive CAD (25-49%). Minimal stenosis (0-24%) distal left main due to calcified plaque otherwise no significant stenosis.   Minimal stenosis (0-24%) due to mixed plaque in the proximal LAD, mild stenosis (25-49%) due to mixed plaque mid/distal LAD, otherwise no significant plaque or stenosis.   Ramus intermedius: Patent.   LCX is patent with no evidence of plaque or stenosis.   Minimal calcified plaque (0-24%) proximal RCA otherwise no significant evidence of plaque or stenosis.   4. CT-FFR negative for hemodynamically significant stenosis.    5. Moderate-sized hiatal hernia.   6. Evidence of prior asbestos exposure and related lung disease with multiple areas of calcified pleural plaque bilaterally and an area of elongated pulmonary density in the posteromedial left lower lobe with associated architectural distortion measuring 2 x 2.8 x 7.9 cm. 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 Given the impressive nature of this area of atelectasis/scarring as well as extensive scarring elsewhere in the left lung and calcified pleural plaques, this may be worth correlating with pulmonary function tests and following by Pulmonology.  RADIOLOGY: NA  Risk Assessment/Calculations:  NA   Labs:       Latest Ref Rng & Units 06/26/2024    4:07 PM 04/15/2024    4:09 AM  04/14/2024    4:20 AM  CBC  WBC 4.0 - 10.5 K/uL 8.7  9.7  10.7   Hemoglobin 13.0 - 17.0 g/dL 87.4  89.4  89.0   Hematocrit 39.0 - 52.0 % 39.1  34.3  34.8   Platelets 150.0 - 400.0 K/uL 304.0  230  249        Latest Ref Rng & Units 04/14/2024    4:20 AM 03/21/2024   11:47 AM 01/05/2023   11:50 AM  BMP  Glucose 70 - 99 mg/dL 871  889  894   BUN 8 - 23 mg/dL 26  22  26    Creatinine 0.61 - 1.24 mg/dL 8.28  8.52  8.32   Sodium 135 - 145 mmol/L 136  138  140   Potassium 3.5 - 5.1 mmol/L 4.7  4.2  4.2   Chloride 98 - 111 mmol/L 102  103  104   CO2 22 - 32 mmol/L 23  24  27    Calcium 8.9 - 10.3 mg/dL 8.7  9.5  9.7       Latest Ref Rng & Units 04/14/2024    4:20 AM 03/21/2024   11:47 AM 01/05/2023   11:50 AM  CMP  Glucose 70 - 99 mg/dL 871  889  894   BUN 8 - 23 mg/dL 26  22  26    Creatinine 0.61 - 1.24 mg/dL 8.28  8.52  8.32   Sodium 135 - 145 mmol/L 136  138  140   Potassium 3.5 - 5.1 mmol/L 4.7  4.2  4.2   Chloride 98 - 111 mmol/L 102  103  104   CO2 22 - 32 mmol/L 23  24  27    Calcium 8.9 - 10.3 mg/dL 8.7  9.5  9.7     No results found for: CHOL, HDL, LDLCALC, LDLDIRECT, TRIG, CHOLHDL No results for input(s): LIPOA in the last 8760 hours. No components found for: NTPROBNP No results for input(s): PROBNP in the last 8760 hours. No results for input(s): TSH in the last 8760 hours.   External Labs: Collected: September 2024 available in Care Everywhere under [facility] Hemoglobin 12.7 A1c 6.7. TSH 1.2. BUN 17, creatinine 1.5. GFR 50 mL/min Total cholesterol 137, triglycerides 102, HDL 61, LDL calculated 53  Physical Exam:    Today's Vitals   07/18/24 1612 07/18/24 1613  BP: (!) 173/78 (!) 168/67  Pulse: 73   Resp: 16   SpO2: 98%   Weight: 236 lb (107 kg)   Height: 6' 1 (1.854 m)    Body mass index is 31.14 kg/m. Wt Readings from Last 3 Encounters:  07/18/24 236 lb (107 kg)  06/26/24 235 lb 12.8 oz (107 kg)  05/11/24 228 lb (103.4 kg)    Physical  Exam  Constitutional: No distress.  Age appropriate, hemodynamically stable.   Neck: No JVD present.  Cardiovascular: Normal rate, regular rhythm, S1 normal, S2 normal, intact distal pulses and normal pulses. Exam reveals no gallop, no S3 and no S4.  No murmur heard. Pulmonary/Chest: Effort normal. No stridor. He has no wheezes. He has no rales.  Decreased breath sounds bilateral  Abdominal: Soft. Bowel sounds are normal. He exhibits no distension. There is no abdominal tenderness.  Musculoskeletal:        General: No edema.     Cervical back: Neck  supple.     Comments: Bilateral foot braces  Neurological: He is alert and oriented to person, place, and time. He has intact cranial nerves (2-12).  Skin: Skin is warm and moist.   Impression & Recommendation(s):  Impression:   ICD-10-CM   1. Nonobstructive atherosclerosis of coronary artery  I25.10     2. Essential hypertension  I10     3. Mixed hyperlipidemia  E78.2     4. Type 2 diabetes mellitus with diabetic neuropathy, without long-term current use of insulin  (HCC)  E11.40     5. OSA treated with BiPAP  G47.33     6. Class 1 obesity due to excess calories with serious comorbidity and body mass index (BMI) of 31.0 to 31.9 in adult  E66.811    E66.09    Z68.31       Recommendation(s):  Nonobstructive atherosclerosis of coronary artery Total CAC 89.6, 65th percentile. Mild nonobstructive CAD per coronary CTA from November 2022. EKG is non-ischemic  Continue aspirin  81 mg p.o. daily. Continue statin therapy. Labs are being done at  [facility].  Recommend goal LDL <70 mg/dL Patient denies any exertional chest pain or heart failure symptoms. Reemphasized the importance of secondary prevention with focus on improving her modifiable cardiovascular risk factors such as glycemic control, lipid management, blood pressure control, weight loss.  Essential hypertension Office and home blood pressures are not very  well-controlled. Currently takes majority of his medications in the morning. Patient states that the medications are being managed by PCP and filled at the [facility]. Current morning meds: Amlodipine  10 mg p.o. daily, Jardiance  25 mg p.o. daily, losartan  100 mg p.o. every morning, Aldactone  50 mg p.o. daily.  Currently takes hydralazine  50 mg p.o. 3 times daily Since he is on multiple antihypertensive medications and he has a wide range of SBP at home recommended taking some BP medications in the morning and some at night. Recommendations for morning medications: Jardiance , hydralazine , spironolactone  Recommendations for evening medications: Hydralazine , losartan , amlodipine  Since his SBP's have been close to 180 mmHg in the recent past we will make medication changes until he has a chance to follow-up with PCP.  Currently takes hydralazine  50 mg p.o. 3 times daily, recommend increasing it to 50 mg 2 tablets p.o. twice daily Patient will follow-up with PCP for longitudinal blood pressure management.  Mixed hyperlipidemia Currently on pravastatin .   He denies myalgia or other side effects. Lipids are currently followed by the [facility]. Recommend a goal LDL <70 mg/dL.  Type 2 diabetes mellitus with diabetic neuropathy, without long-term current use of insulin  (HCC) Re emphasize importance of glycemic control.  Defer management of COPD/asthma/sleep apnea pulmonary medicine.  From a cardiovascular standpoint patient remains stable over the last 1 year.  He was initially referred to the practice for evaluation of dyspnea which has been thoroughly worked up.  Recommended following up with cardiology on as-needed basis or if he has new onset of symptoms.  Patient's wife is agreeable with the plan.  But patient prefers annual follow-up visits.  I will be more than happy to see him either annually or as needed patient to decide.  Orders Placed:  No orders of the defined types were placed in this  encounter.  Final Medication List:   No orders of the defined types were placed in this encounter.    Current Outpatient Medications:    albuterol  (PROVENTIL ) (2.5 MG/3ML) 0.083% nebulizer solution, Take 3 mLs (2.5 mg total) by nebulization every 4 (  four) hours as needed for wheezing or shortness of breath., Disp: 1080 mL, Rfl: 2   albuterol  (VENTOLIN  HFA) 108 (90 Base) MCG/ACT inhaler, Inhale 2 puffs into the lungs every 6 (six) hours as needed for wheezing or shortness of breath., Disp: 8 g, Rfl: 5   amLODipine  (NORVASC ) 10 MG tablet, Take 10 mg by mouth at bedtime., Disp: , Rfl:    bisacodyl  5 MG EC tablet, Take 1 tablet (5 mg total) by mouth as directed., Disp: 4 tablet, Rfl: 0   buPROPion  (WELLBUTRIN  XL) 150 MG 24 hr tablet, Take 150 mg by mouth daily., Disp: , Rfl:    cefdinir  (OMNICEF ) 300 MG capsule, Take 1 capsule (300 mg total) by mouth 2 (two) times daily., Disp: 20 capsule, Rfl: 0   chlorhexidine  (HIBICLENS ) 4 % external liquid, Apply 15 mLs (1 Application total) topically as directed for 30 doses. Use as directed daily for 5 days every other week for 6 weeks., Disp: 946 mL, Rfl: 1   Cholecalciferol  (VITAMIN D3) 1000 UNITS CAPS, Take 1,000 Units by mouth every morning., Disp: , Rfl:    cyanocobalamin  (VITAMIN B12) 1000 MCG tablet, Take 1,000 mcg by mouth daily., Disp: , Rfl:    empagliflozin  (JARDIANCE ) 25 MG TABS tablet, Take 12.5 mg by mouth daily., Disp: , Rfl:    Ferrous Gluconate (FERGON PO), Take 45 mg by mouth daily., Disp: , Rfl:    fexofenadine (ALLEGRA) 180 MG tablet, Take 180 mg by mouth daily., Disp: , Rfl:    fluticasone  (FLONASE ) 50 MCG/ACT nasal spray, Place 1 spray into both nostrils in the morning and at bedtime., Disp: , Rfl:    fluticasone -salmeterol (WIXELA INHUB) 500-50 MCG/ACT AEPB, Inhale 1 puff into the lungs in the morning and at bedtime., Disp: 60 each, Rfl: 5   gabapentin  (NEURONTIN ) 400 MG capsule, Take 400 mg by mouth 3 (three) times daily., Disp: , Rfl:     hydrALAZINE  (APRESOLINE ) 50 MG tablet, Take 100 mg by mouth 2 (two) times daily., Disp: , Rfl:    HYDROcodone -acetaminophen  (NORCO/VICODIN) 5-325 MG tablet, Take 1 tablet by mouth every 6 (six) hours as needed for moderate pain (pain score 4-6)., Disp: 30 tablet, Rfl: 0   hydrOXYzine  (ATARAX /VISTARIL ) 10 MG tablet, Take 20 mg by mouth at bedtime., Disp: , Rfl:    losartan  (COZAAR ) 100 MG tablet, Take 100 mg by mouth every evening., Disp: , Rfl:    melatonin 3 MG TABS tablet, Take 6 mg by mouth at bedtime., Disp: , Rfl:    metFORMIN  (GLUCOPHAGE ) 500 MG tablet, Take 500 mg by mouth 2 (two) times daily with a meal., Disp: , Rfl:    modafinil  (PROVIGIL ) 100 MG tablet, Take 200 mg (2 tablets) in the morning and 100 mg (one tablet) at noon as directed, Disp: 90 tablet, Rfl: 5   NON FORMULARY, BIPAP at bedtime, Disp: , Rfl:    omeprazole  (PRILOSEC  OTC) 20 MG tablet, Take 1 tablet (20 mg total) by mouth daily., Disp: 30 tablet, Rfl: 2   ondansetron  (ZOFRAN -ODT) 4 MG disintegrating tablet, Take 1 tablet (4 mg total) by mouth every 8 (eight) hours as needed., Disp: 20 tablet, Rfl: 0   PARoxetine  (PAXIL ) 40 MG tablet, Take 40 mg by mouth at bedtime., Disp: , Rfl:    Polyethyl Glycol-Propyl Glycol (SYSTANE OP), Place 1 drop into both eyes daily as needed (dry eyes)., Disp: , Rfl:    pravastatin  (PRAVACHOL ) 40 MG tablet, Take 40 mg by mouth at bedtime., Disp: , Rfl:  QUEtiapine  (SEROQUEL ) 25 MG tablet, Take 25 mg by mouth at bedtime., Disp: , Rfl:    spironolactone  (ALDACTONE ) 25 MG tablet, Take 50 mg by mouth daily. , Disp: , Rfl:    tamsulosin  (FLOMAX ) 0.4 MG CAPS capsule, Take 0.4 mg by mouth every morning., Disp: , Rfl:    Tiotropium Bromide  (SPIRIVA  RESPIMAT) 2.5 MCG/ACT AERS, Inhale 2 puffs into the lungs daily., Disp: 1 g, Rfl: 5   triamcinolone ointment (KENALOG) 0.1 %, Apply 1 application  topically daily as needed (rash)., Disp: , Rfl:    Turmeric 400 MG CAPS, Take 400 mg by mouth daily., Disp:  , Rfl:    vitamin E  400 UNIT capsule, Take 400 Units by mouth daily., Disp: , Rfl:   Current Facility-Administered Medications:    0.9 %  sodium chloride  infusion, 500 mL, Intravenous, Once, Cirigliano, Vito V, DO  Consent:   None  Disposition:   One year follow OR as needed.   His questions and concerns were addressed to his satisfaction. He voices understanding of the recommendations provided during this encounter.    Signed, Madonna Michele HAS, [facility]  A Division of [facility] [address], [place], [name] [address]  07/18/2024 5:32 PM

## 2024-07-25 ENCOUNTER — Ambulatory Visit

## 2024-07-25 DIAGNOSIS — R262 Difficulty in walking, not elsewhere classified: Secondary | ICD-10-CM | POA: Insufficient documentation

## 2024-07-25 DIAGNOSIS — M5459 Other low back pain: Secondary | ICD-10-CM | POA: Insufficient documentation

## 2024-07-25 NOTE — Therapy (Signed)
 OUTPATIENT PHYSICAL THERAPY EVALUATION   Patient Name: [name] MRN: [id number] DOB:1951/05/26, 73 y.o., male Today's Date: 07/25/2024  END OF SESSION:  PT End of Session - 07/25/24 1043     Visit Number 2    Number of Visits 15    Date for Recertification  09/03/24    Authorization Type [place]    Authorization Time Period 01/16/24 -08/18/24    Authorization - Visit Number 2    Authorization - Number of Visits 15    PT Start Time 1046    PT Stop Time 1125    PT Time Calculation (min) 39 min    Behavior During Therapy [facility] for tasks assessed/performed           Past Medical History:  Diagnosis Date   Abnormal CT scan of lung 07/23/2021   Abnormality of gait    Allergic rhinitis due to pollen 11/25/2010   Bilateral primary osteoarthritis of hip 12/12/2019   Carpal tunnel syndrome on right    Chronic kidney disease    Complication of anesthesia    Hard to wake up, has narcolepsy   Contact with and (suspected) exposure to asbestos 07/23/2021   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   Hyperlipidemia    Hypothyroidism    Klippel-Feil sequence    Learning disability, unspecified 1997   Reading/reading comprehension   Left inguinal hernia    Low back pain 12/12/2019   Lumbar spondylosis    Major depressive disorder    Mild cognitive impairment 10/18/2022   Nonspecific abnormal electrocardiogram (ECG) (EKG)    Obstructive sleep apnea 12/15/2015   BiPAP   Osteoarthritis    Osteoporosis    Pain in joints of right hand     Peripheral neuropathy    Peripheral venous insufficiency    Pituitary abnormality    pituitary edema; takes bromocriptine    PTSD (post-traumatic stress disorder) 11/25/2010   Spinal stenosis of lumbar region 09/14/2018   Type 2 diabetes mellitus without complications 11/25/2010   Vitamin D deficiency    Weakness of left leg    Wears glasses    Past Surgical History:  Procedure Laterality Date   CARDIAC CATHETERIZATION  2006   LHC by Dr. Levern ORIN MEDIATE RELEASE Right 10/12/2022   Procedure: Right CARPAL TUNNEL RELEASE;  Surgeon: Gillie Duncans, MD;  Location: [facility] OR;  Service: Neurosurgery;  Laterality: Right;  RM 20   CATARACT EXTRACTION Left 03/23/2024   CERVICAL DISC SURGERY     x 2    Posterior approach on both   COLONOSCOPY     EYE SURGERY Right 10/08/2019   cataract extraction at [facility]   HIP ARTHROPLASTY     right   INGUINAL HERNIA REPAIR Left 09/06/2019   Procedure: LAPAROSCOPIC LEFT INGUINAL HERNIA WITH MESH;  Surgeon: Tanda Locus, MD;  Location: [facility];  Service: General;  Laterality: Left;   KNEE ARTHROPLASTY     bilateral   LUMBAR LAMINECTOMY/DECOMPRESSION MICRODISCECTOMY  08/31/2011   Procedure: LUMBAR LAMINECTOMY/DECOMPRESSION MICRODISCECTOMY;  Surgeon: Lynwood SAUNDERS  Amon, MD;  Location: [facility];  Service: Neurosurgery;  Laterality: N/A;  LumbarThree to Lumbar Five Laminectomy, possible Lumbar Four-Five Diskectomy   SHOULDER SURGERY Left    Rotator cuff repair   TOTAL HIP ARTHROPLASTY Left 04/13/2024   Procedure: ARTHROPLASTY, HIP, TOTAL, ANTERIOR APPROACH;  Surgeon: Vernetta Lonni GRADE, MD;  Location: [facility] ORS;  Service: Orthopedics;  Laterality: Left;   UPPER GI ENDOSCOPY     Patient Active Problem List   Diagnosis Date Noted   Status post total replacement of left hip 04/13/2024   Acute bronchitis 03/19/2024   Pseudophakia 10/18/2022   Generalized anxiety disorder 10/18/2022   GERD (gastroesophageal reflux disease) 10/18/2022    Osteoporosis 10/18/2022   Peripheral neuropathy 10/18/2022   Mild cognitive impairment 10/18/2022   Major depressive disorder    Osteoarthritis    Abnormality of gait    Coronary artery disease    Degeneration of cervical intervertebral disc    Lumbar spondylosis    History of total knee arthroplasty    Hyperlipidemia    Hypothyroidism    Klippel-Feil sequence    Dystrophia unguium    Pain in joints of right hand    Peripheral venous insufficiency    Vitamin D deficiency    Abnormal CT scan of lung 07/23/2021   COPD (chronic obstructive pulmonary disease) 07/23/2021   Contact with and (suspected) exposure to asbestos 07/23/2021   Dyspnea on exertion    Nonspecific abnormal electrocardiogram (ECG) (EKG)    Bilateral primary osteoarthritis of hip 12/12/2019   Low back pain 12/12/2019   Spinal stenosis of lumbar region 09/14/2018   Obstructive sleep apnea 12/15/2015   Controlled narcolepsy 12/15/2015   Essential (primary) hypertension 11/25/2010   Type 2 diabetes mellitus without complications 11/25/2010   PTSD (post-traumatic stress disorder) 11/25/2010   Degenerative joint disease involving multiple joints 11/25/2010   Allergic rhinitis due to pollen 11/25/2010   Learning disability, unspecified 1997    PCP: [name], MD  REFERRING PROVIDER: Gillie Duncans, MD   REFERRING DIAG:  M47.16 (ICD-10-CM) - Osteoarthritis of spine with myelopathy, lumbar region  Gait and back pain   Rationale for Evaluation and Treatment: Rehabilitation  THERAPY DIAG:  Other low back pain  Difficulty in walking, not elsewhere classified  ONSET DATE: several years  SUBJECTIVE:                                                                                                                                                                                           SUBJECTIVE STATEMENT:  07/25/2024 Patient reports that he had some pain and soreness for 2 days after eval. He  used a heating  pad and did his exercises. Stating it finally subsided. He rates his pain as 2/10 today. He states that he had an injection yesterday.  He forgot to take BP today because of having to drop off his grandson. Prior to exercise vitals are as follows: BP 154/63, 74bpm  Patient presents to PT with lumbar pain that has been present for several years. He had L THA 3 months ago which has helped with his hip pain, but his back continues to be painful. He would like to avoid surgery, and had recent L5-S1 injections. He states the injection did help, but he continues to have pain with bending and prolonged bending. He states that he continues utilize [facility] outside of the home, or when he is with his 3 y.o. grandson.  He ambulates with BIL AFO, which he's used for a long time.    PERTINENT HISTORY:  Relevant PMHx includes BIL hip OA, CKD, COPD, controlled narcolepsy, dyspnea on exertion, GAD, GERD, hypothyroidism, major depressive disorder, mild cognitive impairment, osteoporosis, peripheral neuropathy, peripheral venous insufficiency, PTSD, lumbar stenosis, Vitamin D deficiency, R THA, BIL TKA, LTHA (04/13/2024)  PAIN:  Are you having pain?  Yes: NPRS scale: 7/10 current, 10/10 at worst  Pain location: LBP  Pain description: stiff, throbbing, sharp  Aggravating factors: prolonged sitting, bending forward  Relieving factors: stay moving  PRECAUTIONS: None  RED FLAGS: None   WEIGHT BEARING RESTRICTIONS: No  FALLS:  Has patient fallen in last 6 months? No  LIVING ENVIRONMENT: Lives with: lives with their spouse Lives in: House/apartment Stairs: No Has following equipment at home: Single point cane and Grab bars  OCCUPATION: retired;   PLOF: Independent and Leisure: yardwork (including development worker, [employer]), babysitting his grandson  PATIENT GOALS: to be able to stoop, bend down and pick myself back up, be able to do my exercises  NEXT MD VISIT: 09/27/23 with pulmonology, 12/26/24 ortho   OBJECTIVE:   Note: Objective measures were completed at Evaluation unless otherwise noted.  DIAGNOSTIC FINDINGS:  Per referral MRI shows significant facet arthopathy at L5-S1 on both the right and left sides. No significant stenosis.   PATIENT SURVEYS:  Modified Oswestry:  MODIFIED OSWESTRY DISABILITY SCALE  Date: 07/09/2024  Score  Pain intensity 1 = The pain is bad, but I can manage without having to take pain medication  2. Personal care (washing, dressing, etc.) 3 =  I need help, but I am able to manage most of my personal care.  3. Lifting 3 = Pain prevents me from lifting heavy weights, but I can manage light to medium weights if they are conveniently positioned  4. Walking 1 = Pain prevents me from walking more than 1 mile.  5. Sitting 2 =  Pain prevents me from sitting more than 1 hour.  6. Standing 5 =  Pain prevents me from standing at all  7. Sleeping 1 = I can sleep well only by using pain medication.  8. Social Life 2 = Pain prevents me from participating in more energetic activities (eg. sports, dancing).  9. Traveling 5 = My pain prevents all travel except for visits to the physician/therapist or hospital  10. Employment/ Homemaking 2 = I can perform most of my homemaking/job duties, but pain prevents me from performing more physically stressful activities (eg, lifting, vacuuming).  Total 24/50   Interpretation of scores: Score Category Description  0-20% Minimal Disability The patient can cope with most living activities. Usually no treatment is indicated apart  from advice on lifting, sitting and exercise  21-40% Moderate Disability The patient experiences more pain and difficulty with sitting, lifting and standing. Travel and social life are more difficult and they may be disabled from work. Personal care, sexual activity and sleeping are not grossly affected, and the patient can usually be managed by conservative means  41-60% Severe Disability Pain remains the main problem in this  group, but activities of daily living are affected. These patients require a detailed investigation  61-80% Crippled Back pain impinges on all aspects of the patients life. Positive intervention is required  81-100% Bed-bound These patients are either bed-bound or exaggerating their symptoms  [name] [name] [name], et al. Surgery versus conservative management of stable thoracolumbar fracture: the PRESTO feasibility RCT. [place] ([place]): [employer]; 2021 Nov. [facility] Technology Assessment, No. 25.62.) Appendix 3, Oswestry Disability Index category descriptors. Available from: [email or web address].cz  Minimally Clinically Important Difference (MCID) = 12.8%  COGNITION: Overall cognitive status: No family/caregiver present to determine baseline cognitive functioning     SENSATION: Not tested   POSTURE: rounded shoulders, forward head, decreased lumbar lordosis, posterior pelvic tilt, and flexed trunk   PALPATION: BIL moderate tenderness to palpation along lumbar spina and paraspinals. L QL > R QL   LUMBAR ROM:   AROM eval  Flexion 75%  Extension 25%  Right lateral flexion 50%  Left lateral flexion 75%  Right rotation   Left rotation    (Blank rows = not tested)  Trunk strength  grossly 3/5   LOWER EXTREMITY ROM:     Active  Right eval Left eval  Hip flexion    Hip extension    Hip abduction    Hip adduction    Hip internal rotation    Hip external rotation    Knee flexion    Knee extension    Ankle dorsiflexion    Ankle plantarflexion    Ankle inversion    Ankle eversion     (Blank rows = not tested)  LOWER EXTREMITY MMT:    MMT Right eval Left eval  Hip flexion    Hip extension    Hip abduction    Hip adduction    Hip internal rotation    Hip external rotation    Knee flexion    Knee extension    Ankle dorsiflexion    Ankle plantarflexion    Ankle inversion    Ankle eversion     (Blank rows = not  tested)  LUMBAR SPECIAL TESTS:  Straight leg raise test: Negative  FUNCTIONAL TESTS:  30 seconds chair stand test: 9 repetitions  GAIT: Distance walked: within clinic  Assistive device utilized: Single point cane and BIL AFO  Level of assistance: Modified independence Comments: forward flexed posture, thoracic kyphosis, decreased trunk rotation and limited PF access    TREATMENT DATE:    [facility] Adult PT Treatment:                                                DATE: 07/25/2024  Prior to exercise vitals are as follows: BP 154/63, 74bpm  Therapeutic Exercise: Seated  thoracolumbar extension 2 x 10, 5 sec  Horizontal Abduction with RTB 2 x 10  BIL ER with RTB 2 x 10  Diagonal abduction with RTB x 10 each side  Pball rolling L flexion x15, 5  R flexion x 15 Updated HEP and reviewed with patient     [facility] Adult PT Treatment:                                                DATE: 07/09/2024   Initial evaluation: see patient education and home exercise program as noted below                                                                                                                                   PATIENT EDUCATION:  Education details: reviewed initial home exercise program; discussion of POC, prognosis and goals for skilled PT   Person educated: Patient Education method: Explanation, Demonstration, and Handouts Education comprehension: verbalized understanding, returned demonstration, and needs further education  HOME EXERCISE PROGRAM: Access Code: [email or web address] URL: [email or web address] Date: 07/09/2024 Prepared by: Marko Molt  Exercises - Seated Thoracic Lumbar Extension  - 1 x daily - 7 x weekly - 1-2 sets - 10 reps - 3 sec hold - Seated Sidebending  - 1 x daily - 7 x weekly - 1-2 sets - 10 reps - 3 sec hold - Seated Thoracic Flexion and Rotation with Arms Crossed  - 1 x daily - 7 x weekly - 1-2 sets - 10 reps - 3 sec hold  ASSESSMENT:  CLINICAL  IMPRESSION: 07/25/2024 Vaughan had fair tolerance of initial treatment session. Additional time for tactile and verbal cues required for instruction. Patient had some mm fatigue with postural mm resistance exercises. Patient requires ongoing skilled PT intervention to address current impairments and related functional deficits. We will continue to progress as tolerated.     EVAL: Erskin is a 73 y.o. male who was seen today for physical therapy evaluation and treatment for persistent LBP. He is demonstrating diminished LS ROM, decreased trunk strength, decreased postural endurance and has moderate TTP with lumbar paraspinal and QL palpation. He has related pain and difficulty with prolonged sitting, bending forward, and floor to waist lifting. He requires skilled PT services at this time to address relevant deficits and improve overall function.     OBJECTIVE IMPAIRMENTS: Abnormal gait, decreased activity tolerance, decreased balance, decreased endurance, decreased ROM, decreased strength, improper body mechanics, postural dysfunction, and pain.   ACTIVITY LIMITATIONS: carrying, lifting, bending, sitting, sleeping, bed mobility, and caring for others  PARTICIPATION LIMITATIONS: meal prep, cleaning, laundry, driving, shopping, and community activity  PERSONAL FACTORS: Age, Past/current experiences, Time since onset of injury/illness/exacerbation, and 3+ comorbidities: Relevant PMHx includes BIL hip OA, CKD, COPD, controlled narcolepsy, dyspnea on exertion, GAD, GERD, hypothyroidism, major depressive disorder, mild cognitive impairment, osteoporosis, peripheral neuropathy, peripheral venous insufficiency, PTSD, lumbar stenosis, Vitamin D deficiency, R THA, BIL TKA, LTHA (04/13/2024) are also affecting patient's functional outcome.   REHAB POTENTIAL: Fair    CLINICAL DECISION MAKING:  Evolving/moderate complexity  EVALUATION COMPLEXITY: Moderate   GOALS: Goals reviewed with patient? YES  SHORT  TERM GOALS: Target date: 08/06/2024   Patient will be independent with initial home program at least 3 days/week.  Baseline: provided at eval Goal Status: INITIAL   2.  Patient will demonstrate improved postural awareness for at least 15 minutes while seated without need for cueing from PT.  Baseline: see objective measures Goal Status: INITIAL   3.  Patient will have improved LS AROM to at least 80% of full ROM with no more than minimal pain.  Baseline: see objective measures  Goal status: INITIAL   LONG TERM GOALS: Target date: 09/03/2024   Patient will report improved overall functional ability with ODI score of 12/50 or less.  Baseline: 24/50 Goal Status: INITIAL   2.  Patient will perform at least 12 STS in 30 seconds.  Baseline: 9 repetitions  Goal status: INITIAL  3.  Patient will report ability to tolerate sitting/driving for at least 1 hour, with minimal symptoms.  Baseline: pain with sitting >15-20 minutes Goal status: INITIAL   4.  Patient will demonstrate ability to perform floor to waist lifting of at least 20# using appropriate body mechanics and with no more than minimal pain in order to safely perform normal daily/occupational tasks.   Baseline: can only tolerate medium weights when placed at convenient height  Goal Status: INITIAL      PLAN:  PT FREQUENCY: 1-2x/week  PT DURATION: 8 weeks  PLANNED INTERVENTIONS: 97164- PT Re-evaluation, 97110-Therapeutic exercises, 97530- Therapeutic activity, [id number]- Neuromuscular re-education, 97535- Self Care, [id number]- Manual therapy, [id number]- Electrical stimulation (unattended), 02987- Traction (mechanical), 20560 (1-2 muscles), 20561 (3+ muscles)- Dry Needling, Patient/Family education, Taping, Spinal mobilization, Cryotherapy, and Moist heat.  PLAN FOR NEXT SESSION: updated HEP as tolerated, postural reed, lumbar pain modulation and core strengthening, including manual therapy, modalties and aerobic activity as indicated.     Marko Molt, PT, DPT  07/25/2024 11:30 AM

## 2024-07-27 ENCOUNTER — Ambulatory Visit

## 2024-07-27 DIAGNOSIS — M5459 Other low back pain: Secondary | ICD-10-CM

## 2024-07-27 DIAGNOSIS — R262 Difficulty in walking, not elsewhere classified: Secondary | ICD-10-CM

## 2024-07-27 NOTE — Therapy (Signed)
 OUTPATIENT PHYSICAL THERAPY EVALUATION   Patient Name: [name] MRN: [id number] DOB:[birth year], 73 y.o., male Today's Date: 07/27/2024  END OF SESSION:  PT End of Session - 07/27/24 1047     Visit Number 3    Number of Visits 15    Date for Recertification  09/03/24    Authorization Type [place]    Authorization Time Period 01/16/24 -08/18/24    Authorization - Visit Number 3    Authorization - Number of Visits 15    PT Start Time 1047    PT Stop Time 1125    PT Time Calculation (min) 38 min    Behavior During Therapy [facility] for tasks assessed/performed           Past Medical History:  Diagnosis Date   Abnormal CT scan of lung 07/23/2021   Abnormality of gait    Allergic rhinitis due to pollen 11/25/2010   Bilateral primary osteoarthritis of hip 12/12/2019   Carpal tunnel syndrome on right    Chronic kidney disease    Complication of anesthesia    Hard to wake up, has narcolepsy   Contact with and (suspected) exposure to asbestos 07/23/2021   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   Hyperlipidemia    Hypothyroidism    Klippel-Feil sequence    Learning disability, unspecified 1997   Reading/reading comprehension   Left inguinal hernia    Low back pain 12/12/2019   Lumbar spondylosis    Major depressive disorder    Mild cognitive impairment 10/18/2022   Nonspecific abnormal electrocardiogram (ECG) (EKG)    Obstructive sleep apnea 12/15/2015   BiPAP   Osteoarthritis    Osteoporosis    Pain in joints of right hand     Peripheral neuropathy    Peripheral venous insufficiency    Pituitary abnormality    pituitary edema; takes bromocriptine    PTSD (post-traumatic stress disorder) 11/25/2010   Spinal stenosis of lumbar region 09/14/2018   Type 2 diabetes mellitus without complications 11/25/2010   Vitamin D deficiency    Weakness of left leg    Wears glasses    Past Surgical History:  Procedure Laterality Date   CARDIAC CATHETERIZATION  2006   LHC by Dr. Levern ORIN MEDIATE RELEASE Right 10/12/2022   Procedure: Right CARPAL TUNNEL RELEASE;  Surgeon: Gillie Duncans, MD;  Location: [facility] OR;  Service: Neurosurgery;  Laterality: Right;  RM 20   CATARACT EXTRACTION Left 03/23/2024   CERVICAL DISC SURGERY     x 2    Posterior approach on both   COLONOSCOPY     EYE SURGERY Right 10/08/2019   cataract extraction at [facility]   HIP ARTHROPLASTY     right   INGUINAL HERNIA REPAIR Left 09/06/2019   Procedure: LAPAROSCOPIC LEFT INGUINAL HERNIA WITH MESH;  Surgeon: Tanda Locus, MD;  Location: [facility];  Service: General;  Laterality: Left;   KNEE ARTHROPLASTY     bilateral   LUMBAR LAMINECTOMY/DECOMPRESSION MICRODISCECTOMY  08/31/2011   Procedure: LUMBAR LAMINECTOMY/DECOMPRESSION MICRODISCECTOMY;  Surgeon: Lynwood SAUNDERS  Amon, MD;  Location: [facility];  Service: Neurosurgery;  Laterality: N/A;  LumbarThree to Lumbar Five Laminectomy, possible Lumbar Four-Five Diskectomy   SHOULDER SURGERY Left    Rotator cuff repair   TOTAL HIP ARTHROPLASTY Left 04/13/2024   Procedure: ARTHROPLASTY, HIP, TOTAL, ANTERIOR APPROACH;  Surgeon: Vernetta Lonni GRADE, MD;  Location: [facility] ORS;  Service: Orthopedics;  Laterality: Left;   UPPER GI ENDOSCOPY     Patient Active Problem List   Diagnosis Date Noted   Status post total replacement of left hip 04/13/2024   Acute bronchitis 03/19/2024   Pseudophakia 10/18/2022   Generalized anxiety disorder 10/18/2022   GERD (gastroesophageal reflux disease) 10/18/2022    Osteoporosis 10/18/2022   Peripheral neuropathy 10/18/2022   Mild cognitive impairment 10/18/2022   Major depressive disorder    Osteoarthritis    Abnormality of gait    Coronary artery disease    Degeneration of cervical intervertebral disc    Lumbar spondylosis    History of total knee arthroplasty    Hyperlipidemia    Hypothyroidism    Klippel-Feil sequence    Dystrophia unguium    Pain in joints of right hand    Peripheral venous insufficiency    Vitamin D deficiency    Abnormal CT scan of lung 07/23/2021   COPD (chronic obstructive pulmonary disease) 07/23/2021   Contact with and (suspected) exposure to asbestos 07/23/2021   Dyspnea on exertion    Nonspecific abnormal electrocardiogram (ECG) (EKG)    Bilateral primary osteoarthritis of hip 12/12/2019   Low back pain 12/12/2019   Spinal stenosis of lumbar region 09/14/2018   Obstructive sleep apnea 12/15/2015   Controlled narcolepsy 12/15/2015   Essential (primary) hypertension 11/25/2010   Type 2 diabetes mellitus without complications 11/25/2010   PTSD (post-traumatic stress disorder) 11/25/2010   Degenerative joint disease involving multiple joints 11/25/2010   Allergic rhinitis due to pollen 11/25/2010   Learning disability, unspecified 1997    PCP: [name], MD  REFERRING PROVIDER: Gillie Duncans, MD   REFERRING DIAG:  M47.16 (ICD-10-CM) - Osteoarthritis of spine with myelopathy, lumbar region  Gait and back pain   Rationale for Evaluation and Treatment: Rehabilitation  THERAPY DIAG:  Other low back pain  Difficulty in walking, not elsewhere classified  ONSET DATE: several years  SUBJECTIVE:                                                                                                                                                                                           SUBJECTIVE STATEMENT:  07/27/2024 Patient reports that he feel fine after last session. He doesn't have much pain this  morning.  Patient presents to PT with lumbar pain that has been present for several years. He had L THA 3 months ago which has helped with his hip pain, but his back continues to be painful. He would like to avoid surgery, and had recent L5-S1 injections. He states the injection did help, but he continues to have pain with bending and prolonged bending. He states that he continues utilize [facility] outside of the home, or when he is with his 19 y.o. grandson.  He ambulates with BIL AFO, which he's used for a long time.    PERTINENT HISTORY:  Relevant PMHx includes BIL hip OA, CKD, COPD, controlled narcolepsy, dyspnea on exertion, GAD, GERD, hypothyroidism, major depressive disorder, mild cognitive impairment, osteoporosis, peripheral neuropathy, peripheral venous insufficiency, PTSD, lumbar stenosis, Vitamin D deficiency, R THA, BIL TKA, LTHA (04/13/2024)  PAIN:  Are you having pain?  Yes: NPRS scale: 7/10 current, 10/10 at worst  Pain location: LBP  Pain description: stiff, throbbing, sharp  Aggravating factors: prolonged sitting, bending forward  Relieving factors: stay moving  PRECAUTIONS: None  RED FLAGS: None   WEIGHT BEARING RESTRICTIONS: No  FALLS:  Has patient fallen in last 6 months? No  LIVING ENVIRONMENT: Lives with: lives with their spouse Lives in: House/apartment Stairs: No Has following equipment at home: Single point cane and Grab bars  OCCUPATION: retired;   PLOF: Independent and Leisure: yardwork (including development worker, international [employer]), babysitting his grandson  PATIENT GOALS: to be able to stoop, bend down and pick myself back up, be able to do my exercises  NEXT MD VISIT: 09/27/23 with pulmonology, 12/26/24 ortho   OBJECTIVE:  Note: Objective measures were completed at Evaluation unless otherwise noted.  DIAGNOSTIC FINDINGS:  Per referral MRI shows significant facet arthopathy at L5-S1 on both the right and left sides. No significant stenosis.   PATIENT SURVEYS:  Modified  Oswestry:  MODIFIED OSWESTRY DISABILITY SCALE  Date: 07/09/2024  Score  Pain intensity 1 = The pain is bad, but I can manage without having to take pain medication  2. Personal care (washing, dressing, etc.) 3 =  I need help, but I am able to manage most of my personal care.  3. Lifting 3 = Pain prevents me from lifting heavy weights, but I can manage light to medium weights if they are conveniently positioned  4. Walking 1 = Pain prevents me from walking more than 1 mile.  5. Sitting 2 =  Pain prevents me from sitting more than 1 hour.  6. Standing 5 =  Pain prevents me from standing at all  7. Sleeping 1 = I can sleep well only by using pain medication.  8. Social Life 2 = Pain prevents me from participating in more energetic activities (eg. sports, dancing).  9. Traveling 5 = My pain prevents all travel except for visits to the physician/therapist or hospital  10. Employment/ Homemaking 2 = I can perform most of my homemaking/job duties, but pain prevents me from performing more physically stressful activities (eg, lifting, vacuuming).  Total 24/50   Interpretation of scores: Score Category Description  0-20% Minimal Disability The patient can cope with most living activities. Usually no treatment is indicated apart from advice on lifting, sitting and exercise  21-40% Moderate Disability The patient experiences more pain and difficulty with sitting, lifting and standing. Travel and social life are more difficult and they may be disabled from work. Personal care, sexual activity and sleeping are not grossly affected, and the patient can usually  be managed by conservative means  41-60% Severe Disability Pain remains the main problem in this group, but activities of daily living are affected. These patients require a detailed investigation  61-80% Crippled Back pain impinges on all aspects of the patients life. Positive intervention is required  81-100% Bed-bound These patients are either  bed-bound or exaggerating their symptoms  Bluford FORBES Zoe DELENA Karon DELENA, et al. Surgery versus conservative management of stable thoracolumbar fracture: the PRESTO feasibility RCT. [place] ([place]): [employer]; 2021 Nov. [facility] Technology Assessment, No. 25.62.) Appendix 3, Oswestry Disability Index category descriptors. Available from: Findjewelers.cz  Minimally Clinically Important Difference (MCID) = 12.8%  COGNITION: Overall cognitive status: No family/caregiver present to determine baseline cognitive functioning     SENSATION: Not tested   POSTURE: rounded shoulders, forward head, decreased lumbar lordosis, posterior pelvic tilt, and flexed trunk   PALPATION: BIL moderate tenderness to palpation along lumbar spina and paraspinals. L QL > R QL   LUMBAR ROM:   AROM eval  Flexion 75%  Extension 25%  Right lateral flexion 50%  Left lateral flexion 75%  Right rotation   Left rotation    (Blank rows = not tested)  Trunk strength  grossly 3/5   LOWER EXTREMITY ROM:     Active  Right eval Left eval  Hip flexion    Hip extension    Hip abduction    Hip adduction    Hip internal rotation    Hip external rotation    Knee flexion    Knee extension    Ankle dorsiflexion    Ankle plantarflexion    Ankle inversion    Ankle eversion     (Blank rows = not tested)  LOWER EXTREMITY MMT:    MMT Right eval Left eval  Hip flexion    Hip extension    Hip abduction    Hip adduction    Hip internal rotation    Hip external rotation    Knee flexion    Knee extension    Ankle dorsiflexion    Ankle plantarflexion    Ankle inversion    Ankle eversion     (Blank rows = not tested)  LUMBAR SPECIAL TESTS:  Straight leg raise test: Negative  FUNCTIONAL TESTS:  30 seconds chair stand test: 9 repetitions  GAIT: Distance walked: within clinic  Assistive device utilized: Single point cane and BIL AFO  Level of assistance:  Modified independence Comments: forward flexed posture, thoracic kyphosis, decreased trunk rotation and limited PF access    TREATMENT DATE:    [facility] Adult PT Treatment:                                                DATE: 07/27/2024  Therapeutic Exercise: NuStep lvl 4 x 8 minutes Seated  Thoracolumbar extension 2 x 10, 5 sec  Horizontal Abduction with RTB 2 x 12  BIL ER with RTB 2 x 10  Diagonal abduction with RTB 2 x 8 each side  3 way pball rolling x 10 each way  Reviewed HEP  PATIENT EDUCATION:  Education details: reviewed initial home exercise program; discussion of POC, prognosis and goals for skilled PT   Person educated: Patient Education method: Explanation, Demonstration, and Handouts Education comprehension: verbalized understanding, returned demonstration, and needs further education  HOME EXERCISE PROGRAM: Access Code: [email or web address] URL: [email or web address] Date: 07/09/2024 Prepared by: Marko Molt  Exercises - Seated Thoracic Lumbar Extension  - 1 x daily - 7 x weekly - 1-2 sets - 10 reps - 3 sec hold - Seated Sidebending  - 1 x daily - 7 x weekly - 1-2 sets - 10 reps - 3 sec hold - Seated Thoracic Flexion and Rotation with Arms Crossed  - 1 x daily - 7 x weekly - 1-2 sets - 10 reps - 3 sec hold  ASSESSMENT:  CLINICAL IMPRESSION: 07/27/2024 Vaughan had good tolerance of today's treatment session. Patient had some mm fatigue with postural mm resistance. However, reports no exacerbation of symptoms by end of today's session. We will continue to progress as tolerated.     EVAL: Jennie is a 73 y.o. male who was seen today for physical therapy evaluation and treatment for persistent LBP. He is demonstrating diminished LS ROM, decreased trunk strength, decreased postural endurance and has moderate TTP with lumbar  paraspinal and QL palpation. He has related pain and difficulty with prolonged sitting, bending forward, and floor to waist lifting. He requires skilled PT services at this time to address relevant deficits and improve overall function.     OBJECTIVE IMPAIRMENTS: Abnormal gait, decreased activity tolerance, decreased balance, decreased endurance, decreased ROM, decreased strength, improper body mechanics, postural dysfunction, and pain.   ACTIVITY LIMITATIONS: carrying, lifting, bending, sitting, sleeping, bed mobility, and caring for others  PARTICIPATION LIMITATIONS: meal prep, cleaning, laundry, driving, shopping, and community activity  PERSONAL FACTORS: Age, Past/current experiences, Time since onset of injury/illness/exacerbation, and 3+ comorbidities: Relevant PMHx includes BIL hip OA, CKD, COPD, controlled narcolepsy, dyspnea on exertion, GAD, GERD, hypothyroidism, major depressive disorder, mild cognitive impairment, osteoporosis, peripheral neuropathy, peripheral venous insufficiency, PTSD, lumbar stenosis, Vitamin D deficiency, R THA, BIL TKA, LTHA (04/13/2024) are also affecting patient's functional outcome.   REHAB POTENTIAL: Fair    CLINICAL DECISION MAKING: Evolving/moderate complexity  EVALUATION COMPLEXITY: Moderate   GOALS: Goals reviewed with patient? YES  SHORT TERM GOALS: Target date: 08/06/2024   Patient will be independent with initial home program at least 3 days/week.  Baseline: provided at eval Goal Status: INITIAL   2.  Patient will demonstrate improved postural awareness for at least 15 minutes while seated without need for cueing from PT.  Baseline: see objective measures Goal Status: INITIAL   3.  Patient will have improved LS AROM to at least 80% of full ROM with no more than minimal pain.  Baseline: see objective measures  Goal status: INITIAL   LONG TERM GOALS: Target date: 09/03/2024   Patient will report improved overall functional ability with  ODI score of 12/50 or less.  Baseline: 24/50 Goal Status: INITIAL   2.  Patient will perform at least 12 STS in 30 seconds.  Baseline: 9 repetitions  Goal status: INITIAL  3.  Patient will report ability to tolerate sitting/driving for at least 1 hour, with minimal symptoms.  Baseline: pain with sitting >15-20 minutes Goal status: INITIAL   4.  Patient will demonstrate ability to perform floor to waist lifting of at least 20# using appropriate body mechanics and with no more than minimal pain in order to safely perform normal daily/occupational tasks.  Baseline: can only tolerate medium weights when placed at convenient height  Goal Status: INITIAL      PLAN:  PT FREQUENCY: 1-2x/week  PT DURATION: 8 weeks  PLANNED INTERVENTIONS: 97164- PT Re-evaluation, 97110-Therapeutic exercises, 97530- Therapeutic activity, [id number]- Neuromuscular re-education, 97535- Self Care, [id number]- Manual therapy, [id number]- Electrical stimulation (unattended), 02987- Traction (mechanical), 20560 (1-2 muscles), 20561 (3+ muscles)- Dry Needling, Patient/Family education, Taping, Spinal mobilization, Cryotherapy, and Moist heat.  PLAN FOR NEXT SESSION: updated HEP as tolerated, postural reed, lumbar pain modulation and core strengthening, including manual therapy, modalties and aerobic activity as indicated.    Marko Molt, PT, DPT  07/27/2024 11:28 AM

## 2024-07-31 ENCOUNTER — Ambulatory Visit

## 2024-07-31 DIAGNOSIS — M5459 Other low back pain: Secondary | ICD-10-CM

## 2024-07-31 DIAGNOSIS — R262 Difficulty in walking, not elsewhere classified: Secondary | ICD-10-CM

## 2024-07-31 NOTE — Therapy (Signed)
 OUTPATIENT PHYSICAL THERAPY NOTE   Patient Name: [name] MRN: [id number] DOB:07-26-1951, 73 y.o., male Today's Date: 07/31/2024  END OF SESSION:  PT End of Session - 07/31/24 1056     Visit Number 4    Number of Visits 15    Date for Recertification  09/03/24    Authorization Type [place]    Authorization Time Period 01/16/24 -08/18/24    Authorization - Visit Number 4    Authorization - Number of Visits 15    PT Start Time 1049    PT Stop Time 1130    PT Time Calculation (min) 41 min    Activity Tolerance Patient tolerated treatment well    Behavior During Therapy [facility] for tasks assessed/performed            Past Medical History:  Diagnosis Date   Abnormal CT scan of lung 07/23/2021   Abnormality of gait    Allergic rhinitis due to pollen 11/25/2010   Bilateral primary osteoarthritis of hip 12/12/2019   Carpal tunnel syndrome on right    Chronic kidney disease    Complication of anesthesia    Hard to wake up, has narcolepsy   Contact with and (suspected) exposure to asbestos 07/23/2021   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   Hyperlipidemia    Hypothyroidism    Klippel-Feil sequence    Learning disability, unspecified 1997   Reading/reading comprehension   Left inguinal hernia    Low back pain 12/12/2019   Lumbar spondylosis    Major depressive disorder    Mild cognitive impairment 10/18/2022   Nonspecific abnormal electrocardiogram (ECG) (EKG)    Obstructive sleep apnea 12/15/2015   BiPAP   Osteoarthritis     Osteoporosis    Pain in joints of right hand    Peripheral neuropathy    Peripheral venous insufficiency    Pituitary abnormality    pituitary edema; takes bromocriptine    PTSD (post-traumatic stress disorder) 11/25/2010   Spinal stenosis of lumbar region 09/14/2018   Type 2 diabetes mellitus without complications 11/25/2010   Vitamin D deficiency    Weakness of left leg    Wears glasses    Past Surgical History:  Procedure Laterality Date   CARDIAC CATHETERIZATION  2006   LHC by Dr. Levern ORIN MEDIATE RELEASE Right 10/12/2022   Procedure: Right CARPAL TUNNEL RELEASE;  Surgeon: Gillie Duncans, MD;  Location: [facility];  Service: Neurosurgery;  Laterality: Right;  RM 20   CATARACT EXTRACTION Left 03/23/2024   CERVICAL DISC SURGERY     x 2    Posterior approach on both   COLONOSCOPY     EYE SURGERY Right 10/08/2019   cataract extraction at [facility]   HIP ARTHROPLASTY     right   INGUINAL HERNIA REPAIR Left 09/06/2019   Procedure: LAPAROSCOPIC LEFT INGUINAL HERNIA WITH MESH;  Surgeon: Tanda Locus, MD;  Location: [facility];  Service: General;  Laterality: Left;   KNEE ARTHROPLASTY     bilateral   LUMBAR LAMINECTOMY/DECOMPRESSION MICRODISCECTOMY  08/31/2011  Procedure: LUMBAR LAMINECTOMY/DECOMPRESSION MICRODISCECTOMY;  Surgeon: Lynwood JONELLE Mill, MD;  Location: [facility];  Service: Neurosurgery;  Laterality: N/A;  LumbarThree to Lumbar Five Laminectomy, possible Lumbar Four-Five Diskectomy   SHOULDER SURGERY Left    Rotator cuff repair   TOTAL HIP ARTHROPLASTY Left 04/13/2024   Procedure: ARTHROPLASTY, HIP, TOTAL, ANTERIOR APPROACH;  Surgeon: Vernetta Lonni GRADE, MD;  Location: [facility] ORS;  Service: Orthopedics;  Laterality: Left;   UPPER GI ENDOSCOPY     Patient Active Problem List   Diagnosis Date Noted   Status post total replacement of left hip 04/13/2024   Acute bronchitis 03/19/2024   Pseudophakia 10/18/2022   Generalized anxiety disorder 10/18/2022   GERD  (gastroesophageal reflux disease) 10/18/2022   Osteoporosis 10/18/2022   Peripheral neuropathy 10/18/2022   Mild cognitive impairment 10/18/2022   Major depressive disorder    Osteoarthritis    Abnormality of gait    Coronary artery disease    Degeneration of cervical intervertebral disc    Lumbar spondylosis    History of total knee arthroplasty    Hyperlipidemia    Hypothyroidism    Klippel-Feil sequence    Dystrophia unguium    Pain in joints of right hand    Peripheral venous insufficiency    Vitamin D deficiency    Abnormal CT scan of lung 07/23/2021   COPD (chronic obstructive pulmonary disease) 07/23/2021   Contact with and (suspected) exposure to asbestos 07/23/2021   Dyspnea on exertion    Nonspecific abnormal electrocardiogram (ECG) (EKG)    Bilateral primary osteoarthritis of hip 12/12/2019   Low back pain 12/12/2019   Spinal stenosis of lumbar region 09/14/2018   Obstructive sleep apnea 12/15/2015   Controlled narcolepsy 12/15/2015   Essential (primary) hypertension 11/25/2010   Type 2 diabetes mellitus without complications 11/25/2010   PTSD (post-traumatic stress disorder) 11/25/2010   Degenerative joint disease involving multiple joints 11/25/2010   Allergic rhinitis due to pollen 11/25/2010   Learning disability, unspecified 1997    PCP: [name], MD  REFERRING PROVIDER: Gillie Duncans, MD   REFERRING DIAG:  M47.16 (ICD-10-CM) - Osteoarthritis of spine with myelopathy, lumbar region  Gait and back pain   Rationale for Evaluation and Treatment: Rehabilitation  THERAPY DIAG:  Other low back pain  Difficulty in walking, not elsewhere classified  ONSET DATE: several years  SUBJECTIVE:                                                                                                                                                                                           SUBJECTIVE STATEMENT:  07/31/2024 Patient reports that his back feels fine  today. No worsening of symptoms after putting up his christmas lights.   Patient presents to PT with lumbar pain that has been present for several years. He had L THA 3 months ago which has helped with his hip pain, but his back continues to be painful. He would like to avoid surgery, and had recent L5-S1 injections. He states the injection did help, but he continues to have pain with bending and prolonged bending. He states that he continues utilize [facility] outside of the home, or when he is with his 81 y.o. grandson.  He ambulates with BIL AFO, which he's used for a long time.    PERTINENT HISTORY:  Relevant PMHx includes BIL hip OA, CKD, COPD, controlled narcolepsy, dyspnea on exertion, GAD, GERD, hypothyroidism, major depressive disorder, mild cognitive impairment, osteoporosis, peripheral neuropathy, peripheral venous insufficiency, PTSD, lumbar stenosis, Vitamin D deficiency, R THA, BIL TKA, LTHA (04/13/2024)  PAIN:  Are you having pain?  Yes: NPRS scale: 7/10 current, 10/10 at worst  Pain location: LBP  Pain description: stiff, throbbing, sharp  Aggravating factors: prolonged sitting, bending forward  Relieving factors: stay moving  PRECAUTIONS: None  RED FLAGS: None   WEIGHT BEARING RESTRICTIONS: No  FALLS:  Has patient fallen in last 6 months? No  LIVING ENVIRONMENT: Lives with: lives with their spouse Lives in: House/apartment Stairs: No Has following equipment at home: Single point cane and Grab bars  OCCUPATION: retired;   PLOF: Independent and Leisure: yardwork (including development worker, international [employer]), babysitting his grandson  PATIENT GOALS: to be able to stoop, bend down and pick myself back up, be able to do my exercises  NEXT MD VISIT: 09/27/23 with pulmonology, 12/26/24 ortho   OBJECTIVE:  Note: Objective measures were completed at Evaluation unless otherwise noted.  DIAGNOSTIC FINDINGS:  Per referral MRI shows significant facet arthopathy at L5-S1 on both the right and left  sides. No significant stenosis.   PATIENT SURVEYS:  Modified Oswestry:  MODIFIED OSWESTRY DISABILITY SCALE  Date: 07/09/2024  Score  Pain intensity 1 = The pain is bad, but I can manage without having to take pain medication  2. Personal care (washing, dressing, etc.) 3 =  I need help, but I am able to manage most of my personal care.  3. Lifting 3 = Pain prevents me from lifting heavy weights, but I can manage light to medium weights if they are conveniently positioned  4. Walking 1 = Pain prevents me from walking more than 1 mile.  5. Sitting 2 =  Pain prevents me from sitting more than 1 hour.  6. Standing 5 =  Pain prevents me from standing at all  7. Sleeping 1 = I can sleep well only by using pain medication.  8. Social Life 2 = Pain prevents me from participating in more energetic activities (eg. sports, dancing).  9. Traveling 5 = My pain prevents all travel except for visits to the physician/therapist or hospital  10. Employment/ Homemaking 2 = I can perform most of my homemaking/job duties, but pain prevents me from performing more physically stressful activities (eg, lifting, vacuuming).  Total 24/50   Interpretation of scores: Score Category Description  0-20% Minimal Disability The patient can cope with most living activities. Usually no treatment is indicated apart from advice on lifting, sitting and exercise  21-40% Moderate Disability The patient experiences more pain and difficulty with sitting, lifting and standing. Travel and social life are more difficult and they may be disabled from work. Personal care, sexual activity  and sleeping are not grossly affected, and the patient can usually be managed by conservative means  41-60% Severe Disability Pain remains the main problem in this group, but activities of daily living are affected. These patients require a detailed investigation  61-80% Crippled Back pain impinges on all aspects of the patients life. Positive  intervention is required  81-100% Bed-bound These patients are either bed-bound or exaggerating their symptoms  Bluford FORBES Zoe DELENA Karon DELENA, et al. Surgery versus conservative management of stable thoracolumbar fracture: the PRESTO feasibility RCT. [place] ([place]): [employer]; 2021 Nov. [facility] Technology Assessment, No. 25.62.) Appendix 3, Oswestry Disability Index category descriptors. Available from: Findjewelers.cz  Minimally Clinically Important Difference (MCID) = 12.8%  COGNITION: Overall cognitive status: No family/caregiver present to determine baseline cognitive functioning     SENSATION: Not tested   POSTURE: rounded shoulders, forward head, decreased lumbar lordosis, posterior pelvic tilt, and flexed trunk   PALPATION: BIL moderate tenderness to palpation along lumbar spina and paraspinals. L QL > R QL   LUMBAR ROM:   AROM eval  Flexion 75%  Extension 25%  Right lateral flexion 50%  Left lateral flexion 75%  Right rotation   Left rotation    (Blank rows = not tested)  Trunk strength  grossly 3/5   LOWER EXTREMITY ROM:     Active  Right eval Left eval  Hip flexion    Hip extension    Hip abduction    Hip adduction    Hip internal rotation    Hip external rotation    Knee flexion    Knee extension    Ankle dorsiflexion    Ankle plantarflexion    Ankle inversion    Ankle eversion     (Blank rows = not tested)  LOWER EXTREMITY MMT:    MMT Right eval Left eval  Hip flexion    Hip extension    Hip abduction    Hip adduction    Hip internal rotation    Hip external rotation    Knee flexion    Knee extension    Ankle dorsiflexion    Ankle plantarflexion    Ankle inversion    Ankle eversion     (Blank rows = not tested)  LUMBAR SPECIAL TESTS:  Straight leg raise test: Negative  FUNCTIONAL TESTS:  30 seconds chair stand test: 9 repetitions  GAIT: Distance walked: within clinic  Assistive  device utilized: Single point cane and BIL AFO  Level of assistance: Modified independence Comments: forward flexed posture, thoracic kyphosis, decreased trunk rotation and limited PF access    TREATMENT DATE:    [facility] Adult PT Treatment:                                                DATE: 07/31/2024  Therapeutic Exercise: NuStep lvl 4 x 8 minutes Seated  Thoracolumbar extension 2 x 10, 5 sec  Horizontal Abduction with green TB 2 x 10 BIL ER with green TB 3x8  Diagonal abduction with green TB 2 x 8 each side  3 way pball rolling x 10 each way  Standing pball press down x 10 each (fwd, left, right)  PATIENT EDUCATION:  Education details: reviewed initial home exercise program; discussion of POC, prognosis and goals for skilled PT   Person educated: Patient Education method: Explanation, Demonstration, and Handouts Education comprehension: verbalized understanding, returned demonstration, and needs further education  HOME EXERCISE PROGRAM: Access Code: [email or web address] URL: [email or web address] Date: 07/09/2024 Prepared by: Marko Molt  Exercises - Seated Thoracic Lumbar Extension  - 1 x daily - 7 x weekly - 1-2 sets - 10 reps - 3 sec hold - Seated Sidebending  - 1 x daily - 7 x weekly - 1-2 sets - 10 reps - 3 sec hold - Seated Thoracic Flexion and Rotation with Arms Crossed  - 1 x daily - 7 x weekly - 1-2 sets - 10 reps - 3 sec hold  ASSESSMENT:  CLINICAL IMPRESSION: 07/31/2024 Vaughan had good tolerance of today's treatment session. Patient was able to tolerate increased resistance with UE strengthening exercises. Initiated core stabilization activities with pball press downs today. Patient denies exacerbation of symptoms by end of today's session. We will continue to progress as tolerated.     EVAL: Rodriguez is a 73 y.o. male who  was seen today for physical therapy evaluation and treatment for persistent LBP. He is demonstrating diminished LS ROM, decreased trunk strength, decreased postural endurance and has moderate TTP with lumbar paraspinal and QL palpation. He has related pain and difficulty with prolonged sitting, bending forward, and floor to waist lifting. He requires skilled PT services at this time to address relevant deficits and improve overall function.     OBJECTIVE IMPAIRMENTS: Abnormal gait, decreased activity tolerance, decreased balance, decreased endurance, decreased ROM, decreased strength, improper body mechanics, postural dysfunction, and pain.   ACTIVITY LIMITATIONS: carrying, lifting, bending, sitting, sleeping, bed mobility, and caring for others  PARTICIPATION LIMITATIONS: meal prep, cleaning, laundry, driving, shopping, and community activity  PERSONAL FACTORS: Age, Past/current experiences, Time since onset of injury/illness/exacerbation, and 3+ comorbidities: Relevant PMHx includes BIL hip OA, CKD, COPD, controlled narcolepsy, dyspnea on exertion, GAD, GERD, hypothyroidism, major depressive disorder, mild cognitive impairment, osteoporosis, peripheral neuropathy, peripheral venous insufficiency, PTSD, lumbar stenosis, Vitamin D deficiency, R THA, BIL TKA, LTHA (04/13/2024) are also affecting patient's functional outcome.   REHAB POTENTIAL: Fair    CLINICAL DECISION MAKING: Evolving/moderate complexity  EVALUATION COMPLEXITY: Moderate   GOALS: Goals reviewed with patient? YES  SHORT TERM GOALS: Target date: 08/06/2024   Patient will be independent with initial home program at least 3 days/week.  Baseline: provided at eval Goal Status: INITIAL   2.  Patient will demonstrate improved postural awareness for at least 15 minutes while seated without need for cueing from PT.  Baseline: see objective measures Goal Status: INITIAL   3.  Patient will have improved LS AROM to at least 80% of  full ROM with no more than minimal pain.  Baseline: see objective measures  Goal status: INITIAL   LONG TERM GOALS: Target date: 09/03/2024   Patient will report improved overall functional ability with ODI score of 12/50 or less.  Baseline: 24/50 Goal Status: INITIAL   2.  Patient will perform at least 12 STS in 30 seconds.  Baseline: 9 repetitions  Goal status: INITIAL  3.  Patient will report ability to tolerate sitting/driving for at least 1 hour, with minimal symptoms.  Baseline: pain with sitting >15-20 minutes Goal status: INITIAL   4.  Patient will demonstrate ability to perform floor to waist lifting of at least 20# using appropriate body mechanics and with no more than minimal  pain in order to safely perform normal daily/occupational tasks.   Baseline: can only tolerate medium weights when placed at convenient height  Goal Status: INITIAL      PLAN:  PT FREQUENCY: 1-2x/week  PT DURATION: 8 weeks  PLANNED INTERVENTIONS: 97164- PT Re-evaluation, 97110-Therapeutic exercises, 97530- Therapeutic activity, [id number]- Neuromuscular re-education, 97535- Self Care, [id number]- Manual therapy, [id number]- Electrical stimulation (unattended), 02987- Traction (mechanical), 20560 (1-2 muscles), 20561 (3+ muscles)- Dry Needling, Patient/Family education, Taping, Spinal mobilization, Cryotherapy, and Moist heat.  PLAN FOR NEXT SESSION: updated HEP as tolerated, postural reed, lumbar pain modulation and core strengthening, including manual therapy, modalties and aerobic activity as indicated.    Marko Molt, PT, DPT  07/31/2024 11:34 AM

## 2024-08-02 ENCOUNTER — Ambulatory Visit

## 2024-08-02 DIAGNOSIS — M5459 Other low back pain: Secondary | ICD-10-CM

## 2024-08-02 DIAGNOSIS — R262 Difficulty in walking, not elsewhere classified: Secondary | ICD-10-CM

## 2024-08-02 NOTE — Therapy (Signed)
 OUTPATIENT PHYSICAL THERAPY NOTE   Patient Name: [name] MRN: [id number] DOB:[birth year], 73 y.o., male Today's Date: 08/02/2024  END OF SESSION:      Past Medical History:  Diagnosis Date   Abnormal CT scan of lung 07/23/2021   Abnormality of gait    Allergic rhinitis due to pollen 11/25/2010   Bilateral primary osteoarthritis of hip 12/12/2019   Carpal tunnel syndrome on right    Chronic kidney disease    Complication of anesthesia    Hard to wake up, has narcolepsy   Contact with and (suspected) exposure to asbestos 07/23/2021   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   Hyperlipidemia    Hypothyroidism    Klippel-Feil sequence    Learning disability, unspecified 1997   Reading/reading comprehension   Left inguinal hernia    Low back pain 12/12/2019   Lumbar spondylosis    Major depressive disorder    Mild cognitive impairment 10/18/2022   Nonspecific abnormal electrocardiogram (ECG) (EKG)    Obstructive sleep apnea 12/15/2015   BiPAP   Osteoarthritis    Osteoporosis    Pain in joints of right hand    Peripheral neuropathy    Peripheral venous insufficiency    Pituitary abnormality    pituitary edema; takes bromocriptine    PTSD (post-traumatic stress disorder) 11/25/2010   Spinal stenosis of lumbar region 09/14/2018   Type 2 diabetes mellitus without complications 11/25/2010   Vitamin D deficiency    Weakness of left leg    Wears glasses    Past Surgical History:  Procedure Laterality Date   CARDIAC  CATHETERIZATION  2006   LHC by Dr. Levern ORIN MEDIATE RELEASE Right 10/12/2022   Procedure: Right CARPAL TUNNEL RELEASE;  Surgeon: Gillie Duncans, MD;  Location: [facility] OR;  Service: Neurosurgery;  Laterality: Right;  RM 20   CATARACT EXTRACTION Left 03/23/2024   CERVICAL DISC SURGERY     x 2    Posterior approach on both   COLONOSCOPY     EYE SURGERY Right 10/08/2019   cataract extraction [facility]   HIP ARTHROPLASTY     right   INGUINAL HERNIA REPAIR Left 09/06/2019   Procedure: LAPAROSCOPIC LEFT INGUINAL HERNIA WITH MESH;  Surgeon: Tanda Locus, MD;  Location: [facility];  Service: General;  Laterality: Left;   KNEE ARTHROPLASTY     bilateral   LUMBAR LAMINECTOMY/DECOMPRESSION MICRODISCECTOMY  08/31/2011   Procedure: LUMBAR LAMINECTOMY/DECOMPRESSION MICRODISCECTOMY;  Surgeon: Lynwood JONELLE Mill, MD;  Location: [facility];  Service: Neurosurgery;  Laterality: N/A;  LumbarThree to Lumbar Five Laminectomy, possible Lumbar Four-Five Diskectomy   SHOULDER SURGERY Left    Rotator cuff repair   TOTAL HIP ARTHROPLASTY Left 04/13/2024   Procedure: ARTHROPLASTY, HIP, TOTAL, ANTERIOR APPROACH;  Surgeon: Vernetta Lonni GRADE, MD;  Location: [facility] ORS;  Service: Orthopedics;  Laterality: Left;   UPPER GI ENDOSCOPY     Patient Active Problem List   Diagnosis Date Noted   Status post total replacement of left hip 04/13/2024   Acute bronchitis 03/19/2024  Pseudophakia 10/18/2022   Generalized anxiety disorder 10/18/2022   GERD (gastroesophageal reflux disease) 10/18/2022   Osteoporosis 10/18/2022   Peripheral neuropathy 10/18/2022   Mild cognitive impairment 10/18/2022   Major depressive disorder    Osteoarthritis    Abnormality of gait    Coronary artery disease    Degeneration of cervical intervertebral disc    Lumbar spondylosis    History of total knee arthroplasty    Hyperlipidemia    Hypothyroidism    Klippel-Feil sequence    Dystrophia unguium    Pain in joints of  right hand    Peripheral venous insufficiency    Vitamin D deficiency    Abnormal CT scan of lung 07/23/2021   COPD (chronic obstructive pulmonary disease) 07/23/2021   Contact with and (suspected) exposure to asbestos 07/23/2021   Dyspnea on exertion    Nonspecific abnormal electrocardiogram (ECG) (EKG)    Bilateral primary osteoarthritis of hip 12/12/2019   Low back pain 12/12/2019   Spinal stenosis of lumbar region 09/14/2018   Obstructive sleep apnea 12/15/2015   Controlled narcolepsy 12/15/2015   Essential (primary) hypertension 11/25/2010   Type 2 diabetes mellitus without complications 11/25/2010   PTSD (post-traumatic stress disorder) 11/25/2010   Degenerative joint disease involving multiple joints 11/25/2010   Allergic rhinitis due to pollen 11/25/2010   Learning disability, unspecified 1997    PCP: [name], MD  REFERRING PROVIDER: Gillie Duncans, MD   REFERRING DIAG:  M47.16 (ICD-10-CM) - Osteoarthritis of spine with myelopathy, lumbar region  Gait and back pain   Rationale for Evaluation and Treatment: Rehabilitation  THERAPY DIAG:  No diagnosis found.  ONSET DATE: several years  SUBJECTIVE:                                                                                                                                                                                           SUBJECTIVE STATEMENT:  08/02/2024 Patient reports that his back feels fine today. No worsening of symptoms after putting up his christmas lights.   Patient presents to PT with lumbar pain that has been present for several years. He had L THA 3 months ago which has helped with his hip pain, but his back continues to be painful. He would like to avoid surgery, and had recent L5-S1 injections. He states the injection did help, but he continues to have pain with bending and prolonged bending. He states that he continues utilize [facility] outside of the home, or when he is with his 2 y.o. grandson.   He ambulates with BIL AFO, which he's used for a long time.    PERTINENT HISTORY:  Relevant PMHx includes BIL hip OA, CKD, COPD, controlled narcolepsy, dyspnea on exertion, GAD, GERD, hypothyroidism, major depressive disorder, mild cognitive impairment, osteoporosis, peripheral neuropathy, peripheral venous insufficiency, PTSD, lumbar stenosis, Vitamin D deficiency, R THA, BIL TKA, LTHA (04/13/2024)  PAIN:  Are you having pain?  Yes: NPRS scale: 7/10 current, 10/10 at worst  Pain location: LBP  Pain description: stiff, throbbing, sharp  Aggravating factors: prolonged sitting, bending forward  Relieving factors: stay moving  PRECAUTIONS: None  RED FLAGS: None   WEIGHT BEARING RESTRICTIONS: No  FALLS:  Has patient fallen in last 6 months? No  LIVING ENVIRONMENT: Lives with: lives with their spouse Lives in: House/apartment Stairs: No Has following equipment at home: Single point cane and Grab bars  OCCUPATION: retired;   PLOF: Independent and Leisure: yardwork (including development worker, [employer]), babysitting his grandson  PATIENT GOALS: to be able to stoop, bend down and pick myself back up, be able to do my exercises  NEXT MD VISIT: 09/27/23 with pulmonology, 12/26/24 ortho   OBJECTIVE:  Note: Objective measures were completed at Evaluation unless otherwise noted.  DIAGNOSTIC FINDINGS:  Per referral MRI shows significant facet arthopathy at L5-S1 on both the right and left sides. No significant stenosis.   PATIENT SURVEYS:  Modified Oswestry:  MODIFIED OSWESTRY DISABILITY SCALE  Date: 07/09/2024  Score  Pain intensity 1 = The pain is bad, but I can manage without having to take pain medication  2. Personal care (washing, dressing, etc.) 3 =  I need help, but I am able to manage most of my personal care.  3. Lifting 3 = Pain prevents me from lifting heavy weights, but I can manage light to medium weights if they are conveniently positioned  4. Walking 1 = Pain prevents me from  walking more than 1 mile.  5. Sitting 2 =  Pain prevents me from sitting more than 1 hour.  6. Standing 5 =  Pain prevents me from standing at all  7. Sleeping 1 = I can sleep well only by using pain medication.  8. Social Life 2 = Pain prevents me from participating in more energetic activities (eg. sports, dancing).  9. Traveling 5 = My pain prevents all travel except for visits to the physician/therapist or hospital  10. Employment/ Homemaking 2 = I can perform most of my homemaking/job duties, but pain prevents me from performing more physically stressful activities (eg, lifting, vacuuming).  Total 24/50   Interpretation of scores: Score Category Description  0-20% Minimal Disability The patient can cope with most living activities. Usually no treatment is indicated apart from advice on lifting, sitting and exercise  21-40% Moderate Disability The patient experiences more pain and difficulty with sitting, lifting and standing. Travel and social life are more difficult and they may be disabled from work. Personal care, sexual activity and sleeping are not grossly affected, and the patient can usually be managed by conservative means  41-60% Severe Disability Pain remains the main problem in this group, but activities of daily living are affected. These patients require a detailed investigation  61-80% Crippled Back pain impinges on all aspects of the patients life. Positive intervention is required  81-100% Bed-bound These patients are either bed-bound or exaggerating their symptoms  Bluford FORBES Zoe DELENA Karon DELENA, et al. Surgery versus conservative management of stable thoracolumbar fracture: the PRESTO feasibility RCT. [place] ([place]): [employer]; 2021 Nov. [facility] Technology Assessment, No. 25.62.) Appendix 3, Oswestry Disability Index category descriptors. Available from: [email or web address].cz  Minimally  Clinically Important Difference (MCID) =  12.8%  COGNITION: Overall cognitive status: No family/caregiver present to determine baseline cognitive functioning     SENSATION: Not tested   POSTURE: rounded shoulders, forward head, decreased lumbar lordosis, posterior pelvic tilt, and flexed trunk   PALPATION: BIL moderate tenderness to palpation along lumbar spina and paraspinals. L QL > R QL   LUMBAR ROM:   AROM eval  Flexion 75%  Extension 25%  Right lateral flexion 50%  Left lateral flexion 75%  Right rotation   Left rotation    (Blank rows = not tested)  Trunk strength  grossly 3/5   LOWER EXTREMITY ROM:     Active  Right eval Left eval  Hip flexion    Hip extension    Hip abduction    Hip adduction    Hip internal rotation    Hip external rotation    Knee flexion    Knee extension    Ankle dorsiflexion    Ankle plantarflexion    Ankle inversion    Ankle eversion     (Blank rows = not tested)  LOWER EXTREMITY MMT:    MMT Right eval Left eval  Hip flexion    Hip extension    Hip abduction    Hip adduction    Hip internal rotation    Hip external rotation    Knee flexion    Knee extension    Ankle dorsiflexion    Ankle plantarflexion    Ankle inversion    Ankle eversion     (Blank rows = not tested)  LUMBAR SPECIAL TESTS:  Straight leg raise test: Negative  FUNCTIONAL TESTS:  30 seconds chair stand test: 9 repetitions  GAIT: Distance walked: within clinic  Assistive device utilized: Single point cane and BIL AFO  Level of assistance: Modified independence Comments: forward flexed posture, thoracic kyphosis, decreased trunk rotation and limited PF access    TREATMENT DATE:    [facility] Adult PT Treatment:                                                DATE: 08/02/2024  Therapeutic Exercise: NuStep lvl 4 x 8 minutes Seated  Thoracolumbar extension 2 x 10, 5 sec  Horizontal Abduction with green TB 2 x 10 BIL ER with green TB 2x10  Diagonal abduction with green TB 2 x 8 each  side  3 way pball rolling x 10 each way  90/90 SLR                                                                                                                                    PATIENT EDUCATION:  Education details: reviewed initial home exercise program; discussion of POC, prognosis and goals for skilled PT   Person educated: Patient  Education method: Explanation, Demonstration, and Handouts Education comprehension: verbalized understanding, returned demonstration, and needs further education  HOME EXERCISE PROGRAM: Access Code: [email or web address] URL: [email or web address] Date: 07/09/2024 Prepared by: Marko Molt  Exercises - Seated Thoracic Lumbar Extension  - 1 x daily - 7 x weekly - 1-2 sets - 10 reps - 3 sec hold - Seated Sidebending  - 1 x daily - 7 x weekly - 1-2 sets - 10 reps - 3 sec hold - Seated Thoracic Flexion and Rotation with Arms Crossed  - 1 x daily - 7 x weekly - 1-2 sets - 10 reps - 3 sec hold  ASSESSMENT:  CLINICAL IMPRESSION: 08/02/2024 Vaughan had good tolerance of today's treatment session. Patient was able to tolerate increased resistance with UE strengthening exercises. Initiated core stabilization activities with pball press downs today. Patient denies exacerbation of symptoms by end of today's session. We will continue to progress as tolerated.     EVAL: Cane is a 73 y.o. male who was seen today for physical therapy evaluation and treatment for persistent LBP. He is demonstrating diminished LS ROM, decreased trunk strength, decreased postural endurance and has moderate TTP with lumbar paraspinal and QL palpation. He has related pain and difficulty with prolonged sitting, bending forward, and floor to waist lifting. He requires skilled PT services at this time to address relevant deficits and improve overall function.     OBJECTIVE IMPAIRMENTS: Abnormal gait, decreased activity tolerance, decreased balance, decreased endurance, decreased  ROM, decreased strength, improper body mechanics, postural dysfunction, and pain.   ACTIVITY LIMITATIONS: carrying, lifting, bending, sitting, sleeping, bed mobility, and caring for others  PARTICIPATION LIMITATIONS: meal prep, cleaning, laundry, driving, shopping, and community activity  PERSONAL FACTORS: Age, Past/current experiences, Time since onset of injury/illness/exacerbation, and 3+ comorbidities: Relevant PMHx includes BIL hip OA, CKD, COPD, controlled narcolepsy, dyspnea on exertion, GAD, GERD, hypothyroidism, major depressive disorder, mild cognitive impairment, osteoporosis, peripheral neuropathy, peripheral venous insufficiency, PTSD, lumbar stenosis, Vitamin D deficiency, R THA, BIL TKA, LTHA (04/13/2024) are also affecting patient's functional outcome.   REHAB POTENTIAL: Fair    CLINICAL DECISION MAKING: Evolving/moderate complexity  EVALUATION COMPLEXITY: Moderate   GOALS: Goals reviewed with patient? YES  SHORT TERM GOALS: Target date: 08/06/2024   Patient will be independent with initial home program at least 3 days/week.  Baseline: provided at eval Goal Status: INITIAL   2.  Patient will demonstrate improved postural awareness for at least 15 minutes while seated without need for cueing from PT.  Baseline: see objective measures Goal Status: INITIAL   3.  Patient will have improved LS AROM to at least 80% of full ROM with no more than minimal pain.  Baseline: see objective measures  Goal status: INITIAL   LONG TERM GOALS: Target date: 09/03/2024   Patient will report improved overall functional ability with ODI score of 12/50 or less.  Baseline: 24/50 Goal Status: INITIAL   2.  Patient will perform at least 12 STS in 30 seconds.  Baseline: 9 repetitions  Goal status: INITIAL  3.  Patient will report ability to tolerate sitting/driving for at least 1 hour, with minimal symptoms.  Baseline: pain with sitting >15-20 minutes Goal status: INITIAL   4.   Patient will demonstrate ability to perform floor to waist lifting of at least 20# using appropriate body mechanics and with no more than minimal pain in order to safely perform normal daily/occupational tasks.   Baseline: can only tolerate medium weights when placed at convenient height  Goal  Status: INITIAL      PLAN:  PT FREQUENCY: 1-2x/week  PT DURATION: 8 weeks  PLANNED INTERVENTIONS: 97164- PT Re-evaluation, 97110-Therapeutic exercises, 97530- Therapeutic activity, [id number]- Neuromuscular re-education, 97535- Self Care, [id number]- Manual therapy, [id number]- Electrical stimulation (unattended), 97012- Traction (mechanical), 20560 (1-2 muscles), 20561 (3+ muscles)- Dry Needling, Patient/Family education, Taping, Spinal mobilization, Cryotherapy, and Moist heat.  PLAN FOR NEXT SESSION: updated HEP as tolerated, postural reed, lumbar pain modulation and core strengthening, including manual therapy, modalties and aerobic activity as indicated.    Marko Molt, PT, DPT  08/02/2024 11:31 AM

## 2024-08-06 ENCOUNTER — Ambulatory Visit: Admitting: Pulmonary Disease

## 2024-08-06 ENCOUNTER — Encounter: Payer: Self-pay | Admitting: Pulmonary Disease

## 2024-08-06 ENCOUNTER — Ambulatory Visit (INDEPENDENT_AMBULATORY_CARE_PROVIDER_SITE_OTHER): Admitting: Pulmonary Disease

## 2024-08-06 VITALS — BP 138/64 | HR 65 | Temp 97.7°F | Ht 72.5 in | Wt 231.8 lb

## 2024-08-06 DIAGNOSIS — Z8659 Personal history of other mental and behavioral disorders: Secondary | ICD-10-CM | POA: Diagnosis not present

## 2024-08-06 DIAGNOSIS — G4733 Obstructive sleep apnea (adult) (pediatric): Secondary | ICD-10-CM | POA: Diagnosis not present

## 2024-08-06 DIAGNOSIS — G47419 Narcolepsy without cataplexy: Secondary | ICD-10-CM

## 2024-08-06 DIAGNOSIS — J449 Chronic obstructive pulmonary disease, unspecified: Secondary | ICD-10-CM

## 2024-08-06 DIAGNOSIS — J9811 Atelectasis: Secondary | ICD-10-CM

## 2024-08-06 MED ORDER — DUPIXENT 300 MG/2ML ~~LOC~~ SOAJ: 300.0000 mg | SUBCUTANEOUS | 12 refills | Status: AC

## 2024-08-06 NOTE — Patient Instructions (Signed)
 We will start the process for Dupixent  for COPD  It is an injectable for COPD management with injections every 2 weeks. - The injectable is on top of what you are already doing for your COPD - You will continue with all your inhalers and nebulization treatments - Dupixent  300 mg subcu every 2 weeks  We will Place an order to our pharmacy to be facilitated through the [place]  DME order through the [place] for CPAP mask and nebulizer supplies  Follow-up in about 3 months

## 2024-08-06 NOTE — Progress Notes (Signed)
 "              [name]    [id number]    [birth year]  Primary Care Physician:[name], Tim CROME, MD  Referring Physician: Addie Tim CROME, MD [address] [name],  [name] [id number]  Chief complaint:    Continues to have multiple exacerbations Compliant with BiPAP Has a history of narcolepsy, obstructive sleep apnea On stimulant medications  HPI:  Breathing feels about the same Cough, shortness of breath  Remains concerned about multiple exacerbations His PFT was reviewed with him today showing severe obstructive disease, minimal bronchodilator response  Cough, congestion, wheezing  Does have occasional chest discomfort  For his narcolepsy, remains on modafinil  Ongoing fatigue   He does have pleural plaques and rounded atelectasis on CT scan, had a repeat CT scan recently showing stable findings  History of PTSD  Exposures: No significant exposures Smoking history: Never smoker   Outpatient Encounter Medications as of 08/06/2024  Medication Sig   albuterol  (PROVENTIL ) (2.5 MG/3ML) 0.083% nebulizer solution Take 3 mLs (2.5 mg total) by nebulization every 4 (four) hours as needed for wheezing or shortness of breath.   albuterol  (VENTOLIN  HFA) 108 (90 Base) MCG/ACT inhaler Inhale 2 puffs into the lungs every 6 (six) hours as needed for wheezing or shortness of breath.   amLODipine  (NORVASC ) 10 MG tablet Take 10 mg by mouth at bedtime.   azithromycin  (ZITHROMAX ) 250 MG tablet Take 250 mg by mouth 3 (three) times a week.   bisacodyl  5 MG EC tablet Take 1 tablet (5 mg total) by mouth as directed.   buPROPion  (WELLBUTRIN  XL) 150 MG 24 hr tablet Take 150 mg by mouth daily.   chlorhexidine  (HIBICLENS ) 4 % external liquid Apply 15 mLs (1 Application total) topically as directed for 30 doses. Use as directed daily for 5 days every other week for 6 weeks.   Cholecalciferol  (VITAMIN D3) 1000 UNITS CAPS Take 1,000 Units by mouth every morning.   cyanocobalamin  (VITAMIN  B12) 1000 MCG tablet Take 1,000 mcg by mouth daily.   Dupilumab  (DUPIXENT ) 300 MG/2ML SOAJ Inject 300 mg into the skin every 14 (fourteen) days.   empagliflozin  (JARDIANCE ) 25 MG TABS tablet Take 12.5 mg by mouth daily.   Ferrous Gluconate (FERGON PO) Take 45 mg by mouth daily.   fexofenadine (ALLEGRA) 180 MG tablet Take 180 mg by mouth daily.   fluticasone  (FLONASE ) 50 MCG/ACT nasal spray Place 1 spray into both nostrils in the morning and at bedtime.   fluticasone -salmeterol (WIXELA INHUB) 500-50 MCG/ACT AEPB Inhale 1 puff into the lungs in the morning and at bedtime.   gabapentin  (NEURONTIN ) 400 MG capsule Take 400 mg by mouth 3 (three) times daily.   hydrALAZINE  (APRESOLINE ) 50 MG tablet Take 100 mg by mouth 2 (two) times daily.   HYDROcodone -acetaminophen  (NORCO/VICODIN) 5-325 MG tablet Take 1 tablet by mouth every 6 (six) hours as needed for moderate pain (pain score 4-6).   hydrOXYzine  (ATARAX /VISTARIL ) 10 MG tablet Take 20 mg by mouth at bedtime.   losartan  (COZAAR ) 100 MG tablet Take 100 mg by mouth every evening.   melatonin 3 MG TABS tablet Take 6 mg by mouth at bedtime.   metFORMIN  (GLUCOPHAGE ) 500 MG tablet Take 500 mg by mouth 2 (two) times daily with a meal.   modafinil  (PROVIGIL ) 100 MG tablet Take 200 mg (2 tablets) in the morning and 100 mg (one tablet) at noon as directed   NON FORMULARY BIPAP at bedtime  omeprazole  (PRILOSEC  OTC) 20 MG tablet Take 1 tablet (20 mg total) by mouth daily.   ondansetron  (ZOFRAN -ODT) 4 MG disintegrating tablet Take 1 tablet (4 mg total) by mouth every 8 (eight) hours as needed.   PARoxetine  (PAXIL ) 40 MG tablet Take 40 mg by mouth at bedtime.   Polyethyl Glycol-Propyl Glycol (SYSTANE OP) Place 1 drop into both eyes daily as needed (dry eyes).   pravastatin  (PRAVACHOL ) 40 MG tablet Take 40 mg by mouth at bedtime.   QUEtiapine  (SEROQUEL ) 25 MG tablet Take 25 mg by mouth at bedtime.   spironolactone  (ALDACTONE ) 25 MG tablet Take 50 mg by mouth  daily.    tamsulosin  (FLOMAX ) 0.4 MG CAPS capsule Take 0.4 mg by mouth every morning.   Tiotropium Bromide  (SPIRIVA  RESPIMAT) 2.5 MCG/ACT AERS Inhale 2 puffs into the lungs daily.   triamcinolone ointment (KENALOG) 0.1 % Apply 1 application  topically daily as needed (rash).   Turmeric 400 MG CAPS Take 400 mg by mouth daily.   vitamin E  400 UNIT capsule Take 400 Units by mouth daily.   [DISCONTINUED] cefdinir  (OMNICEF ) 300 MG capsule Take 1 capsule (300 mg total) by mouth 2 (two) times daily.   Facility-Administered Encounter Medications as of 08/06/2024  Medication   0.9 %  sodium chloride  infusion    Allergies as of 08/06/2024 - Review Complete 08/06/2024  Allergen Reaction Noted   Misc. sulfonamide containing compounds Swelling 07/23/2022   Ace inhibitors Swelling 08/27/2011   Sulfa antibiotics Swelling 08/27/2011    Past Medical History:  Diagnosis Date   Abnormal CT scan of lung 07/23/2021   Abnormality of gait    Allergic rhinitis due to pollen 11/25/2010   Bilateral primary osteoarthritis of hip 12/12/2019   Carpal tunnel syndrome on right    Chronic kidney disease    Complication of anesthesia    Hard to wake up, has narcolepsy   Contact with and (suspected) exposure to asbestos 07/23/2021   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   Hyperlipidemia    Hypothyroidism    Klippel-Feil sequence    Learning disability, unspecified 1997   Reading/reading comprehension   Left inguinal hernia    Low back pain 12/12/2019   Lumbar  spondylosis    Major depressive disorder    Mild cognitive impairment 10/18/2022   Nonspecific abnormal electrocardiogram (ECG) (EKG)    Obstructive sleep apnea 12/15/2015   BiPAP   Osteoarthritis    Osteoporosis    Pain in joints of right hand    Peripheral neuropathy    Peripheral venous insufficiency    Pituitary abnormality    pituitary edema; takes bromocriptine    PTSD (post-traumatic stress disorder) 11/25/2010   Spinal stenosis of lumbar region 09/14/2018   Type 2 diabetes mellitus without complications 11/25/2010   Vitamin D deficiency    Weakness of left leg    Wears glasses     Past Surgical History:  Procedure Laterality Date   CARDIAC CATHETERIZATION  2006   LHC by Dr. Levern ORIN MEDIATE RELEASE Right 10/12/2022  Procedure: Right CARPAL TUNNEL RELEASE;  Surgeon: Gillie Duncans, MD;  Location: [facility] OR;  Service: Neurosurgery;  Laterality: Right;  RM 20   CATARACT EXTRACTION Left 03/23/2024   CERVICAL DISC SURGERY     x 2    Posterior approach on both   COLONOSCOPY     EYE SURGERY Right 10/08/2019   cataract extraction at [facility]   HIP ARTHROPLASTY     right   INGUINAL HERNIA REPAIR Left 09/06/2019   Procedure: LAPAROSCOPIC LEFT INGUINAL HERNIA WITH MESH;  Surgeon: Tanda Locus, MD;  Location: [facility];  Service: General;  Laterality: Left;   KNEE ARTHROPLASTY     bilateral   LUMBAR LAMINECTOMY/DECOMPRESSION MICRODISCECTOMY  08/31/2011   Procedure: LUMBAR LAMINECTOMY/DECOMPRESSION MICRODISCECTOMY;  Surgeon: Lynwood JONELLE Mill, MD;  Location: [facility];  Service: Neurosurgery;  Laterality: N/A;  LumbarThree to Lumbar Five Laminectomy, possible Lumbar Four-Five Diskectomy   SHOULDER SURGERY Left    Rotator cuff repair   TOTAL HIP ARTHROPLASTY Left 04/13/2024   Procedure: ARTHROPLASTY, HIP, TOTAL, ANTERIOR APPROACH;  Surgeon: Vernetta Lonni GRADE, MD;  Location: [facility] ORS;  Service: Orthopedics;  Laterality: Left;   UPPER GI ENDOSCOPY      Family  History  Problem Relation Age of Onset   Diabetes Mother    Alzheimer's disease Mother    Depression Father    ALS Father    ALS Brother    Hypertension Brother    Hypertension Brother    Cancer Brother    Colon cancer Neg Hx    Rectal cancer Neg Hx    Stomach cancer Neg Hx    Esophageal cancer Neg Hx     Social History   Socioeconomic History   Marital status: Married    Spouse name: Not on file   Number of children: 2   Years of education: 14   Highest education level: Tax Adviser degree: occupational, scientist, product/process development, or vocational program  Occupational History   Occupation: Retired    Comment: Technical Brewer  Tobacco Use   Smoking status: Never   Smokeless tobacco: Never  Vaping Use   Vaping status: Never Used  Substance and Sexual Activity   Alcohol  use: No   Drug use: No   Sexual activity: Not Currently  Other Topics Concern   Not on file  Social History Narrative   Left handed   Drinks caffeine, soda   Lives with wife   Retired   Problems with cognition, memory loss   Social Drivers of Health   Tobacco Use: Low Risk (08/06/2024)   Patient History    Smoking Tobacco Use: Never    Smokeless Tobacco Use: Never    Passive Exposure: Not on file  Financial Resource Strain: Not on file  Food Insecurity: No Food Insecurity (04/13/2024)   Epic    Worried About Programme Researcher, Broadcasting/film/video in the Last Year: Never true    Ran Out of Food in the Last Year: Never true  Transportation Needs: No Transportation Needs (04/13/2024)   Epic    Lack of Transportation (Medical): No    Lack of Transportation (Non-Medical): No  Physical Activity: Not on file  Stress: Not on file  Social Connections: Socially Integrated (04/13/2024)   Social Connection and Isolation Panel    Frequency of Communication with Friends and Family: Three times a week    Frequency of Social Gatherings with Friends and Family: Three times a week    Attends Religious Services: More than 4 times per year  Active  Member of Clubs or Organizations: Yes    Attends Banker Meetings: More than 4 times per year    Marital Status: Married  Catering Manager Violence: Not At Risk (04/13/2024)   Epic    Fear of Current or Ex-Partner: No    Emotionally Abused: No    Physically Abused: No    Sexually Abused: No  Depression (PHQ2-9): Medium Risk (04/26/2023)   Depression (PHQ2-9)    PHQ-2 Score: 10  Alcohol  Screen: Not on file  Housing: Low Risk (04/13/2024)   Epic    Unable to Pay for Housing in the Last Year: No    Number of Times Moved in the Last Year: 0    Homeless in the Last Year: No  Utilities: Not At Risk (04/13/2024)   Epic    Threatened with loss of utilities: No  Health Literacy: Not on file   Review of Systems  Constitutional:  Negative for activity change and appetite change.  HENT: Negative.    Eyes: Negative.   Respiratory:  Positive for apnea, cough, shortness of breath and wheezing. Negative for choking.   Cardiovascular: Negative.   Endocrine: Negative.   Genitourinary: Negative.   Allergic/Immunologic: Negative.   Neurological: Negative.   Hematological: Negative.   Psychiatric/Behavioral:  Positive for sleep disturbance.     Vitals:   08/06/24 1343  BP: 138/64  Pulse: 65  Temp: 97.7 F (36.5 C)  SpO2: 98%     Physical Exam Constitutional:      General: He is not in acute distress.    Appearance: He is well-developed. He is not diaphoretic.  HENT:     Head: Normocephalic and atraumatic.     Mouth/Throat:     Mouth: Mucous membranes are moist.  Eyes:     General: No scleral icterus.    Pupils: Pupils are equal, round, and reactive to light.  Neck:     Thyroid: No thyromegaly.     Trachea: No tracheal deviation.  Cardiovascular:     Rate and Rhythm: Normal rate and regular rhythm.  Pulmonary:     Effort: Pulmonary effort is normal. No respiratory distress.     Breath sounds: Normal breath sounds. No stridor. No wheezing or rhonchi.   Musculoskeletal:        General: No deformity. Normal range of motion.     Cervical back: No rigidity or tenderness.  Skin:    General: Skin is warm and dry.     Findings: No erythema.  Neurological:     Mental Status: He is alert.     Coordination: Coordination normal.  Psychiatric:        Behavior: Behavior normal.    Data Reviewed: Sleep study from November 2007 noted Compliance data was not reviewed today  Pulmonary function test 06/26/2024 shows severe obstructive disease, no significant bronchodilator response, normal total lung capacity, normal diffusing capacity  Last CT chest 11/30/2023 shows rounded atelectasis at the left base  His last CBC shows eosinophil of 500  Assessment:  .  Obstructive sleep apnea - Continue BiPAP on a nightly basis  Narcolepsy - Continue modafinil   COPD with recurrent exacerbations - Remains on Wixela and Spiriva  Remains on azithromycin  250 mg 3 times a week A discussion was had with the patient regarding addition of Dupixent  especially with his recurrent exacerbations  Will start paperwork for addition of Dupixent  to his COPD controller medications  .  History of PTSD  .  Rounded atelectasis left base -  Repeat CT in about a year or more depending on symptoms   Plan/Recommendations:  Continue BiPAP  Continue azithromycin   Continue modafinil   Prescription placed for Dupixent   Follow-up in about 3 months  Encouraged to call with significant concerns   Jennet Epley MD [facility] 08/06/2024, 2:16 PM  CC: Tim Tim CROME, MD   "

## 2024-08-07 ENCOUNTER — Telehealth: Payer: Self-pay

## 2024-08-07 NOTE — Telephone Encounter (Signed)
 Copied from CRM #[id number]. Topic: Clinical - Prescription Issue >> Aug 06, 2024  2:59 PM Joesph PARAS wrote: Reason for CRM: Patient's spouse is calling to request that prescription they were just discussing with clinic be sent to the [facility]. If it is not sent to the [place] they do not want it at all.   Routing to pharmacy as this is in regards to new start Dupixent .

## 2024-08-07 NOTE — Telephone Encounter (Signed)
 Rx was already sent to [facility]

## 2024-08-15 NOTE — Telephone Encounter (Signed)
 Spoke to patient's wife - scheduled for Dupixent  injection in clinic on 08/27/24.

## 2024-08-16 ENCOUNTER — Ambulatory Visit: Payer: Self-pay | Admitting: Primary Care

## 2024-08-20 NOTE — Therapy (Incomplete)
 " OUTPATIENT PHYSICAL THERAPY NOTE RECERTIFICATION   Patient Name: [name] MRN: [id number] DOB:Apr 29, 1951, 74 y.o., male Today's Date: 08/21/2024  Progress Note Reporting Period *** to 08/21/2024  See note below for Objective Data and Assessment of Progress/Goals.      END OF SESSION:  PT End of Session - 08/21/24 1003     Visit Number 6    Number of Visits 15    Date for Recertification  09/03/24    Authorization Type [facility]    Authorization Time Period 01/16/24 -08/18/24    Authorization - Visit Number 6    Authorization - Number of Visits 15    PT Start Time 1003    PT Stop Time 1041    PT Time Calculation (min) 38 min    Activity Tolerance Patient tolerated treatment well    Behavior During Therapy WFL for tasks assessed/performed             Past Medical History:  Diagnosis Date   Abnormal CT scan of lung 07/23/2021   Abnormality of gait    Allergic rhinitis due to pollen 11/25/2010   Bilateral primary osteoarthritis of hip 12/12/2019   Carpal tunnel syndrome on right    Chronic kidney disease    Complication of anesthesia    Hard to wake up, has narcolepsy   Contact with and (suspected) exposure to asbestos 07/23/2021   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   Hyperlipidemia    Hypothyroidism    Klippel-Feil sequence    Learning disability, unspecified 1997   Reading/reading comprehension   Left inguinal hernia    Low back pain 12/12/2019   Lumbar spondylosis    Major depressive disorder    Mild cognitive  impairment 10/18/2022   Nonspecific abnormal electrocardiogram (ECG) (EKG)    Obstructive sleep apnea 12/15/2015   BiPAP   Osteoarthritis    Osteoporosis    Pain in joints of right hand    Peripheral neuropathy    Peripheral venous insufficiency    Pituitary abnormality    pituitary edema; takes bromocriptine    PTSD (post-traumatic stress disorder) 11/25/2010   Spinal stenosis of lumbar region 09/14/2018   Type 2 diabetes mellitus without complications 11/25/2010   Vitamin D deficiency    Weakness of left leg    Wears glasses    Past Surgical History:  Procedure Laterality Date   CARDIAC CATHETERIZATION  2006   LHC by Dr. Levern ORIN MEDIATE RELEASE Right 10/12/2022   Procedure: Right CARPAL TUNNEL RELEASE;  Surgeon: Gillie Duncans, MD;  Location: [facility] OR;  Service: Neurosurgery;  Laterality: Right;  RM 20   CATARACT EXTRACTION Left 03/23/2024   CERVICAL DISC SURGERY     x 2    Posterior approach on both   COLONOSCOPY     EYE SURGERY Right 10/08/2019   cataract [facility] at [facility]   HIP ARTHROPLASTY     right   INGUINAL HERNIA REPAIR Left 09/06/2019   Procedure: LAPAROSCOPIC LEFT INGUINAL HERNIA WITH MESH;  Surgeon: Tanda Locus, MD;  Location:  [facility];  Service: General;  Laterality: Left;   KNEE ARTHROPLASTY     bilateral   LUMBAR LAMINECTOMY/DECOMPRESSION MICRODISCECTOMY  08/31/2011   Procedure: LUMBAR LAMINECTOMY/DECOMPRESSION MICRODISCECTOMY;  Surgeon: Lynwood JONELLE Mill, MD;  Location: [facility];  Service: Neurosurgery;  Laterality: N/A;  LumbarThree to Lumbar Five Laminectomy, possible Lumbar Four-Five Diskectomy   SHOULDER SURGERY Left    Rotator cuff repair   TOTAL HIP ARTHROPLASTY Left 04/13/2024   Procedure: ARTHROPLASTY, HIP, TOTAL, ANTERIOR APPROACH;  Surgeon: Vernetta Lonni GRADE, MD;  Location: [facility] ORS;  Service: Orthopedics;  Laterality: Left;   UPPER GI ENDOSCOPY     Patient Active Problem List   Diagnosis Date Noted   Status post total  replacement of left hip 04/13/2024   Acute bronchitis 03/19/2024   Pseudophakia 10/18/2022   Generalized anxiety disorder 10/18/2022   GERD (gastroesophageal reflux disease) 10/18/2022   Osteoporosis 10/18/2022   Peripheral neuropathy 10/18/2022   Mild cognitive impairment 10/18/2022   Major depressive disorder    Osteoarthritis    Abnormality of gait    Coronary artery disease    Degeneration of cervical intervertebral disc    Lumbar spondylosis    History of total knee arthroplasty    Hyperlipidemia    Hypothyroidism    Klippel-Feil sequence    Dystrophia unguium    Pain in joints of right hand    Peripheral venous insufficiency    Vitamin D deficiency    Abnormal CT scan of lung 07/23/2021   COPD (chronic obstructive pulmonary disease) 07/23/2021   Contact with and (suspected) exposure to asbestos 07/23/2021   Dyspnea on exertion    Nonspecific abnormal electrocardiogram (ECG) (EKG)    Bilateral primary osteoarthritis of hip 12/12/2019   Low back pain 12/12/2019   Spinal stenosis of lumbar region 09/14/2018   Obstructive sleep apnea 12/15/2015   Controlled narcolepsy 12/15/2015   Essential (primary) hypertension 11/25/2010   Type 2 diabetes mellitus without complications 11/25/2010   PTSD (post-traumatic stress disorder) 11/25/2010   Degenerative joint disease involving multiple joints 11/25/2010   Allergic rhinitis due to pollen 11/25/2010   Learning disability, unspecified 1997    PCP: [name], MD  REFERRING PROVIDER: Gillie Duncans, MD   REFERRING DIAG:  M47.16 (ICD-10-CM) - Osteoarthritis of spine with myelopathy, lumbar region  Gait and back pain   Rationale for Evaluation and Treatment: Rehabilitation  THERAPY DIAG:  Other low back pain  Difficulty in walking, not elsewhere classified  ONSET DATE: several years  SUBJECTIVE:  SUBJECTIVE STATEMENT:  08/21/2024 Patient reports that he has made some improvement since starting PT with ability to participate in exercises, long walking, and mowing bigger portions of his lawn before taking breaks. He would like to continue working with PT services to address core and trunk strength, LE strengthening, improve exercise tolerance for weight loss goals.    Patient presents to PT with lumbar pain that has been present for several years. He had L THA 3 months ago which has helped with his hip pain, but his back continues to be painful. He would like to avoid surgery, and had recent L5-S1 injections. He states the injection did help, but he continues to have pain with bending and prolonged bending. He states that he continues utilize [facility] outside of the home, or when he is with his 74 y.o. grandson.  He ambulates with BIL AFO, which he's used for a long time.    PERTINENT HISTORY:  Relevant PMHx includes BIL hip OA, CKD, COPD, controlled narcolepsy, dyspnea on exertion, GAD, GERD, hypothyroidism, major depressive disorder, mild cognitive impairment, osteoporosis, peripheral neuropathy, peripheral venous insufficiency, PTSD, lumbar stenosis, Vitamin D deficiency, R THA, BIL TKA, LTHA (04/13/2024)  PAIN:  Are you having pain?  Yes: NPRS scale: 7/10 current, 10/10 at worst  Pain location: LBP  Pain description: stiff, throbbing, sharp  Aggravating factors: prolonged sitting, bending forward  Relieving factors: stay moving  PRECAUTIONS: None  RED FLAGS: None   WEIGHT BEARING RESTRICTIONS: No  FALLS:  Has patient fallen in last 6 months? No  LIVING ENVIRONMENT: Lives with: lives with their spouse Lives in: House/apartment Stairs: No Has following equipment at home: Single point cane and Grab bars  OCCUPATION: retired;   PLOF: Independent and Leisure: yardwork (including development worker, international aid), babysitting his  grandson  PATIENT GOALS: to be able to stoop, bend down and pick myself back up, be able to do my exercises  NEXT MD VISIT: 09/27/23 with pulmonology, 12/26/24 ortho   OBJECTIVE:  Note: Objective measures were completed at Evaluation unless otherwise noted.  DIAGNOSTIC FINDINGS:  Per referral MRI shows significant facet arthopathy at L5-S1 on both the right and left sides. No significant stenosis.   PATIENT SURVEYS:  Modified Oswestry:  MODIFIED OSWESTRY DISABILITY SCALE  Date: 07/09/2024  Score  Pain intensity 1 = The pain is bad, but I can manage without having to take pain medication  2. Personal care (washing, dressing, etc.) 3 =  I need help, but I am able to manage most of my personal care.  3. Lifting 3 = Pain prevents me from lifting heavy weights, but I can manage light to medium weights if they are conveniently positioned  4. Walking 1 = Pain prevents me from walking more than 1 mile.  5. Sitting 2 =  Pain prevents me from sitting more than 1 hour.  6. Standing 5 =  Pain prevents me from standing at all  7. Sleeping 1 = I can sleep well only by using pain medication.  8. Social Life 2 = Pain prevents me from participating in more energetic activities (eg. sports, dancing).  9. Traveling 5 = My pain prevents all travel except for visits to the physician/therapist or hospital  10. Employment/ Homemaking 2 = I can perform most of my homemaking/job duties, but pain prevents me from performing more physically stressful activities (eg, lifting, vacuuming).  Total 24/50   Interpretation of scores: Score Category Description  0-20% Minimal Disability The patient can cope with most living activities.  Usually no treatment is indicated apart from advice on lifting, sitting and exercise  21-40% Moderate Disability The patient experiences more pain and difficulty with sitting, lifting and standing. Travel and social life are more difficult and they may be disabled from work. Personal care,  sexual activity and sleeping are not grossly affected, and the patient can usually be managed by conservative means  41-60% Severe Disability Pain remains the main problem in this group, but activities of daily living are affected. These patients require a detailed investigation  61-80% Crippled Back pain impinges on all aspects of the patients life. Positive intervention is required  81-100% Bed-bound These patients are either bed-bound or exaggerating their symptoms  Bluford FORBES Zoe DELENA Karon DELENA, et al. Surgery versus conservative management of stable thoracolumbar fracture: the PRESTO feasibility RCT. [place] ([place]): [employer]; 2021 Nov. [facility] Technology Assessment, No. 25.62.) Appendix 3, Oswestry Disability Index category descriptors. Available from: [email or web address].cz  Minimally Clinically Important Difference (MCID) = 12.8%  COGNITION: Overall cognitive status: No family/caregiver present to determine baseline cognitive functioning     SENSATION: Not tested   POSTURE: rounded shoulders, forward head, decreased lumbar lordosis, posterior pelvic tilt, and flexed trunk   PALPATION: BIL moderate tenderness to palpation along lumbar spina and paraspinals. L QL > R QL   LUMBAR ROM:   AROM eval 08/21/2024  Flexion 75% 75%  Extension 25% 25  Right lateral flexion 50% 75%  Left lateral flexion 75% 75%  Right rotation    Left rotation     (Blank rows = not tested)  Trunk strength  grossly 3/5   LOWER EXTREMITY ROM:     Active  Right eval Left eval  Hip flexion    Hip extension    Hip abduction    Hip adduction    Hip internal rotation    Hip external rotation    Knee flexion    Knee extension    Ankle dorsiflexion    Ankle plantarflexion    Ankle inversion    Ankle eversion     (Blank rows = not tested)  LOWER EXTREMITY MMT:    MMT Right eval Left eval  Hip flexion    Hip extension    Hip abduction    Hip adduction     Hip internal rotation    Hip external rotation    Knee flexion    Knee extension    Ankle dorsiflexion    Ankle plantarflexion    Ankle inversion    Ankle eversion     (Blank rows = not tested)  LUMBAR SPECIAL TESTS:  Straight leg raise test: Negative  FUNCTIONAL TESTS:  30 seconds chair stand test: 9 repetitions  GAIT: Distance walked: within clinic  Assistive device utilized: Single point cane and BIL AFO  Level of assistance: Modified independence Comments: forward flexed posture, thoracic kyphosis, decreased trunk rotation and limited PF access    TREATMENT DATE:    [facility] Adult PT Treatment:                                                DATE: 08/20/2024  Therapeutic Activity:  Reassessment of objective measures and subjective assessment regarding progress towards established goals and updated plan for addressing remaining deficits and rehab goals.   Therapeutic Exercise: Supine  90/90, 2 x 30 sec  SLR, x 10 each  Updated HEP and reviewed recommended frequency                                                                                                                                     PATIENT EDUCATION:  Education details: reviewed initial home exercise program; discussion of POC, prognosis and goals for skilled PT   Person educated: Patient Education method: Explanation, Demonstration, and Handouts Education comprehension: verbalized understanding, returned demonstration, and needs further education  HOME EXERCISE PROGRAM: Access Code: [email or web address] URL: [email or web address] Date: 07/09/2024 Prepared by: Marko Molt  Exercises - Seated Thoracic Lumbar Extension  - 1 x daily - 7 x weekly - 1-2 sets - 10 reps - 3 sec hold - Seated Sidebending  - 1 x daily - 7 x weekly - 1-2 sets - 10 reps - 3 sec hold - Seated Thoracic Flexion and Rotation with Arms Crossed  - 1 x daily - 7 x weekly - 1-2 sets - 10 reps - 3 sec  hold  ASSESSMENT:  CLINICAL IMPRESSION: 08/21/2024 Patient has attended 6 PT sessions to address chronic LBP with mobility deficits and decreased trunk stretch. He is demonstrating good improvement of ***. He continues to have difficulty with ***. He requires ongoing skilled PT intervention in order to address remaining deficits and progress towards functional rehab goals. He will have a pause in PT services pending additional authorization from [place]. It is recommended for patient to continue with PT 2x/week x 4 weeks in order to maximize rehab outcomes and overall tolerance of ADLs/IADLs.   EVAL: Avedis is a 74 y.o. male who was seen today for physical therapy evaluation and treatment for persistent LBP. He is demonstrating diminished LS ROM, decreased trunk strength, decreased postural endurance and has moderate TTP with lumbar paraspinal and QL palpation. He has related pain and difficulty with prolonged sitting, bending forward, and floor to waist lifting. He requires skilled PT services at this time to address relevant deficits and improve overall function.     OBJECTIVE IMPAIRMENTS: Abnormal gait, decreased activity tolerance, decreased balance, decreased endurance, decreased ROM, decreased strength, improper body mechanics, postural dysfunction, and pain.   ACTIVITY LIMITATIONS: carrying, lifting, bending, sitting, sleeping, bed mobility, and caring for others  PARTICIPATION LIMITATIONS: meal prep, cleaning, laundry, driving, shopping, and community activity  PERSONAL FACTORS: Age, Past/current experiences, Time since onset of injury/illness/exacerbation, and 3+ comorbidities: Relevant PMHx includes BIL hip OA, CKD, COPD, controlled narcolepsy, dyspnea on exertion, GAD, GERD, hypothyroidism, major depressive disorder, mild cognitive impairment, osteoporosis, peripheral neuropathy, peripheral venous insufficiency, PTSD, lumbar stenosis, Vitamin D deficiency, R THA, BIL TKA, LTHA (04/13/2024) are  also affecting patient's functional outcome.   REHAB POTENTIAL: Fair    CLINICAL DECISION MAKING: Evolving/moderate complexity  EVALUATION COMPLEXITY: Moderate   GOALS: Goals reviewed with patient? YES  SHORT TERM GOALS: Target date: 08/06/2024   Patient will be independent with initial home program at least 3 days/week.  Baseline:  provided at eval Goal Status: Ongoing  2.  Patient will demonstrate improved postural awareness for at least 15 minutes while seated without need for cueing from PT.  Baseline: see objective measures  Goal Status: ONGOING   3.  Patient will have improved LS AROM to at least 80% of full ROM with no more than minimal pain.  Baseline: see objective measures  Goal status: nearly MET   LONG TERM GOALS: Target date: 09/03/2024    Patient will report improved overall functional ability with ODI score of 12/50 or less.  Baseline: 24/50 08/21/24: 11/50 Goal Status: MET  2.  Patient will perform at least 12 STS in 30 seconds.  Baseline: 9 repetitions  08/22/23: 9 re Goal status: ONGOING  3.  Patient will report ability to tolerate sitting/driving for at least 1 hour, with minimal symptoms.  Baseline: pain with sitting >15-20 minutes Goal status: MET 08/21/24  4.  Patient will demonstrate ability to perform floor to waist lifting of at least 20# using appropriate body mechanics and with no more than minimal pain in order to safely perform normal daily/occupational tasks.   Baseline: can only tolerate medium weights when placed at convenient height  Goal Status: MET 08/21/24 able to lift case of water        PLAN:  PT FREQUENCY: 1-2x/week  PT DURATION: 8 weeks  PLANNED INTERVENTIONS: 97164- PT Re-evaluation, 97110-Therapeutic exercises, 97530- Therapeutic activity, 97112- Neuromuscular re-education, 97535- Self Care, 02859- Manual therapy, [id number]- Electrical stimulation (unattended), 02987- Traction (mechanical), 20560 (1-2 muscles), 20561 (3+  muscles)- Dry Needling, Patient/Family education, Taping, Spinal mobilization, Cryotherapy, and Moist heat.  PLAN FOR NEXT SESSION: updated HEP as tolerated, postural reed, lumbar pain modulation and core strengthening, including manual therapy, modalties and aerobic activity as indicated.    Marko Molt, PT, DPT  08/21/2024 10:20 AM      "

## 2024-08-21 ENCOUNTER — Ambulatory Visit: Attending: Neurosurgery

## 2024-08-21 ENCOUNTER — Telehealth: Payer: Self-pay

## 2024-08-21 DIAGNOSIS — M5459 Other low back pain: Secondary | ICD-10-CM | POA: Insufficient documentation

## 2024-08-21 DIAGNOSIS — R262 Difficulty in walking, not elsewhere classified: Secondary | ICD-10-CM | POA: Insufficient documentation

## 2024-08-21 NOTE — Telephone Encounter (Signed)
 Received referral for new start Dupixent. Opening benefits investigation in this thread.

## 2024-08-22 ENCOUNTER — Other Ambulatory Visit (HOSPITAL_COMMUNITY): Payer: Self-pay

## 2024-08-22 NOTE — Telephone Encounter (Signed)
 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 Dupixent  rx was sent to them on 12/22. Called the pharmacy and rep stated the Dupixent  was shipped to the pt on 1/5.  Had preemptively submitted a pa for Dupixent  to pt's [facility]. Can be used as backup. Authorization has been APPROVED from 08/16/24 to 08/15/25. Approval letter sent to scan center.  Per test claim, copay for 28 days supply is $100  Authorization # [id number] Phone # [phone]

## 2024-08-22 NOTE — Telephone Encounter (Signed)
 Patient scheduled for new start visit 08/27/24. Advised patient's spouse to place Dupixent  in the refrigerator when the receive from [place]. First dose in clinic on 08/27/24.

## 2024-08-23 ENCOUNTER — Ambulatory Visit

## 2024-08-24 ENCOUNTER — Ambulatory Visit

## 2024-08-27 ENCOUNTER — Ambulatory Visit

## 2024-08-27 DIAGNOSIS — J449 Chronic obstructive pulmonary disease, unspecified: Secondary | ICD-10-CM

## 2024-08-27 DIAGNOSIS — Z7189 Other specified counseling: Secondary | ICD-10-CM

## 2024-08-27 NOTE — Progress Notes (Signed)
 "  HPI Patient presents today, accompanied by spouse, to [facility] to see pharmacy team for Dupixent  new start.  Past medical history includes OSA, narcolepsy, and COPD. Last seen by Dr. Neda on 08/06/24. At that time, plan to start Dupixent  for COPD. Medication will be obtained through [facility].   He is here today for injection training.   Respiratory Medications Current regimen:  - Spiriva  Respimat 2.5 mcg/act (Inhale 2 puffs once daily)  - Wixela Inhub 500-50 mcg/act (Inhale 1 puff BID)  - azithromycin  250mg  tablet (Take 1 tablet by mouth three times weekly) - Ventolin  108 mcg/act (Inhale 2 puffs into the lungs every 6 (six) hours as needed for wheezing or shortness of breath) - Proventil  2.5mg /49mL 0.083% soln (Take 3 mLs (2.5 mg total) by nebulization every 4 (four) hours as needed for wheezing or shortness of breath)  OBJECTIVE Allergies[1]  Outpatient Encounter Medications as of 08/27/2024  Medication Sig   albuterol  (PROVENTIL ) (2.5 MG/3ML) 0.083% nebulizer solution Take 3 mLs (2.5 mg total) by nebulization every 4 (four) hours as needed for wheezing or shortness of breath.   albuterol  (VENTOLIN  HFA) 108 (90 Base) MCG/ACT inhaler Inhale 2 puffs into the lungs every 6 (six) hours as needed for wheezing or shortness of breath.   amLODipine  (NORVASC ) 10 MG tablet Take 10 mg by mouth at bedtime.   azithromycin  (ZITHROMAX ) 250 MG tablet Take 250 mg by mouth 3 (three) times a week.   bisacodyl  5 MG EC tablet Take 1 tablet (5 mg total) by mouth as directed.   buPROPion  (WELLBUTRIN  XL) 150 MG 24 hr tablet Take 150 mg by mouth daily.   chlorhexidine  (HIBICLENS ) 4 % external liquid Apply 15 mLs (1 Application total) topically as directed for 30 doses. Use as directed daily for 5 days every other week for 6 weeks.   Cholecalciferol  (VITAMIN D3) 1000 UNITS CAPS Take 1,000 Units by mouth every morning.   cyanocobalamin  (VITAMIN B12) 1000 MCG tablet Take 1,000 mcg by mouth daily.   Dupilumab   (DUPIXENT ) 300 MG/2ML SOAJ Inject 300 mg into the skin every 14 (fourteen) days.   empagliflozin  (JARDIANCE ) 25 MG TABS tablet Take 12.5 mg by mouth daily.   Ferrous Gluconate (FERGON PO) Take 45 mg by mouth daily.   fexofenadine (ALLEGRA) 180 MG tablet Take 180 mg by mouth daily.   fluticasone  (FLONASE ) 50 MCG/ACT nasal spray Place 1 spray into both nostrils in the morning and at bedtime.   fluticasone -salmeterol (WIXELA INHUB) 500-50 MCG/ACT AEPB Inhale 1 puff into the lungs in the morning and at bedtime.   gabapentin  (NEURONTIN ) 400 MG capsule Take 400 mg by mouth 3 (three) times daily.   hydrALAZINE  (APRESOLINE ) 50 MG tablet Take 100 mg by mouth 2 (two) times daily.   HYDROcodone -acetaminophen  (NORCO/VICODIN) 5-325 MG tablet Take 1 tablet by mouth every 6 (six) hours as needed for moderate pain (pain score 4-6).   hydrOXYzine  (ATARAX /VISTARIL ) 10 MG tablet Take 20 mg by mouth at bedtime.   losartan  (COZAAR ) 100 MG tablet Take 100 mg by mouth every evening.   melatonin 3 MG TABS tablet Take 6 mg by mouth at bedtime.   metFORMIN  (GLUCOPHAGE ) 500 MG tablet Take 500 mg by mouth 2 (two) times daily with a meal.   modafinil  (PROVIGIL ) 100 MG tablet Take 200 mg (2 tablets) in the morning and 100 mg (one tablet) at noon as directed   NON FORMULARY BIPAP at bedtime   omeprazole  (PRILOSEC  OTC) 20 MG tablet Take 1 tablet (20  mg total) by mouth daily.   ondansetron  (ZOFRAN -ODT) 4 MG disintegrating tablet Take 1 tablet (4 mg total) by mouth every 8 (eight) hours as needed.   PARoxetine  (PAXIL ) 40 MG tablet Take 40 mg by mouth at bedtime.   Polyethyl Glycol-Propyl Glycol (SYSTANE OP) Place 1 drop into both eyes daily as needed (dry eyes).   pravastatin  (PRAVACHOL ) 40 MG tablet Take 40 mg by mouth at bedtime.   QUEtiapine  (SEROQUEL ) 25 MG tablet Take 25 mg by mouth at bedtime.   spironolactone  (ALDACTONE ) 25 MG tablet Take 50 mg by mouth daily.    tamsulosin  (FLOMAX ) 0.4 MG CAPS capsule Take 0.4 mg by  mouth every morning.   Tiotropium Bromide  (SPIRIVA  RESPIMAT) 2.5 MCG/ACT AERS Inhale 2 puffs into the lungs daily.   triamcinolone ointment (KENALOG) 0.1 % Apply 1 application  topically daily as needed (rash).   Turmeric 400 MG CAPS Take 400 mg by mouth daily.   vitamin E  400 UNIT capsule Take 400 Units by mouth daily.   Facility-Administered Encounter Medications as of 08/27/2024  Medication   0.9 %  sodium chloride  infusion     Immunization History  Administered Date(s) Administered    sv, Bivalent, Protein Subunit Rsvpref,pf (Abrysvo) 06/01/2022   Fluad Quad(high Dose 65+) 05/12/2020, 06/23/2021, 06/04/2022, 05/02/2024   Fluad Trivalent(High Dose 65+) 06/13/2018   INFLUENZA, HIGH DOSE SEASONAL PF 05/24/2017, 05/10/2019, 04/25/2023   Influenza Split 05/16/2014   Influenza,inj,Quad PF,6+ Mos 06/18/2016   Influenza-Unspecified 05/28/2003, 07/16/2004, 07/01/2005, 05/31/2006, 05/17/2007, 09/14/2007, 04/29/2008, 05/23/2009, 05/16/2010, 05/06/2011, 06/08/2012, 06/12/2013, 06/03/2015, 06/13/2018, 06/04/2022   Moderna Covid-19 Fall Seasonal Vaccine 27yrs & older 06/04/2022, 04/25/2023, 05/08/2024   Moderna Covid-19 Vaccine Bivalent Booster 80yrs & up 06/18/2021   Moderna Sars-Covid-2 Vaccination 11/05/2019, 12/03/2019, 08/25/2020   Pneumococcal Conjugate-13 12/22/2016   Pneumococcal Polysaccharide-23 05/10/2019   Pneumococcal-Unspecified 05/28/2003   RSV,unspecified 06/01/2022   Tdap 06/08/2012, 04/28/2022   Zoster Recombinant(Shingrix) 05/12/2020, 10/30/2020   Zoster, Live 01/02/2014     PFTs    Latest Ref Rng & Units 06/26/2024   12:46 PM  PFT Results  FVC-Pre L 2.56   FVC-Predicted Pre % 51   FVC-Post L 2.67   FVC-Predicted Post % 53   Pre FEV1/FVC % % 65   Post FEV1/FCV % % 69   FEV1-Pre L 1.66   FEV1-Predicted Pre % 45   FEV1-Post L 1.85   DLCO uncorrected ml/min/mmHg 24.59   DLCO UNC% % 86   DLVA Predicted % 139   TLC L 7.86   TLC % Predicted % 101   RV % Predicted  % 197      Eosinophils Most recent blood eosinophil count was 500 cells/microL taken on 06/26/24.   Assessment   Biologics training for dupilumab  (Dupixent )  Goals of therapy: Mechanism: human monoclonal IgG4 antibody that inhibits interleukin-4 and interleukin-13 cytokine-induced responses, including release of proinflammatory cytokines, chemokines, and IgE Reviewed that Dupixent  is add-on medication and patient must continue maintenance inhaler regimen. Response to therapy: may take 4 months to determine efficacy. Discussed that patients generally feel improvement sooner than 4 months.  Side effects: injection site reaction (6-18%), antibody development (5-16%), ophthalmic conjunctivitis (2-16%), transient blood eosinophilia (1-2%)  Dose: 300mg  every 14 days (for COPD)  Administration/Storage:  Reviewed administration sites of thigh or abdomen (at least 2-3 inches away from abdomen). Reviewed the upper arm is only appropriate if caregiver is administering injection  Do not shake pen/syringe as this could lead to product foaming or precipitation. Do not use if  solution is discolored or contains particulate matter or if window on prefilled pen is yellow (indicates pen has been used).  Reviewed storage of medication in refrigerator. Reviewed that Dupixent  can be stored at room temperature in unopened carton for up to 14 days.  Access: Approval of Dupixent  through: [place]  Patient self-administered Dupixent  300mg /36ml x 1 (total dose 300mg ) in R upper thigh using sample  Dupixent  300mg /80mL autoinjector pen NDC: [phone] Lot: [id number] Expiration: 2026-03-15  **Of note, due to injection technique error, no drug was administered on first try in R lower abdomen; drug wasted. The second pen malfunctioned due to device issue; no drug received. He successfully self-administered Dupixent  300mg /59mL on a third try in R upper thigh, although part of the injection was missed due to pulling back  before the second click.**  Patient monitored for 30 minutes for adverse reaction.  Patient tolerated well.  Injection site noted. Patient denies itchiness and irritation at injection.  PLAN Continue Dupixent  300mg  every 14 days.  Next dose is due 09/10/24 and every 14 days thereafter. Rx sent to: [facility]  Patient reports he has already received shipment. Continue remaining respiratory regimen as prescribed by Dr. Neda  All questions encouraged and answered.  Instructed patient to reach out with any further questions or concerns.  Thank you for allowing pharmacy to participate in this patient's care.  This appointment required 45 minutes of patient care (this includes precharting, chart review, review of results, face-to-face care, etc.).  Aleck Puls, PharmD, BCPS, CPP Clinical Pharmacist  [facility]  [facility]     [1]  Allergies Allergen Reactions   Misc. Sulfonamide Containing Compounds Swelling   Ace Inhibitors Swelling   Sulfa Antibiotics Swelling   "

## 2024-08-27 NOTE — Patient Instructions (Signed)
 Your next DUPIXENT  dose is due on 09/10/24, 09/24/24, and every 14 days thereafter  This medication is stored in the refrigerator. Please note, you should remove the drug from the refrigerator AT LEAST 45 minutes prior to injection to ensure the injection comes down to room temperature before injecting the medication. If needed, can remove from refrigerator the night before injection day.  If needed, this medication can be stored at room temperature for no longer than 14 days.   If you miss a dose, you should take it as soon as you remember it within 7 days of the missed dose, then continue your usual schedule.  As a reminder, some side effects include injection site reaction, back ache, and joint pain. If you are struggling with side effects, please call our office.   It can take several weeks to see the potential benefit of the medication. Please continue your other medications.   CONTINUE all your other medications. Dupixent  does NOT replace these.   Your prescription will be shipped from the [facility].   You will need to be seen by your provider in 3 to 4 months to assess how Dupixent  is working for you. You have a follow-up appointment scheduled 10/23/24 with Dr. Neda.   Stay up to date on all routine vaccines: influenza, pneumonia, COVID19, Shingles  How to manage an injection site reaction: Remember the 5 C's: COUNTER - leave on the counter at least 30 minutes but up to overnight to bring medication to room temperature. This may help prevent stinging COLD - place something cold (like an ice gel pack or cold water  bottle) on the injection site just before cleansing with alcohol . This may help reduce pain CLARITIN  - use Claritin  (generic name is loratadine ) for the first two weeks of treatment or the day of, the day before, and the day after injecting. This will help to minimize injection site reactions CORTISONE CREAM - apply if injection site is irritated and itching CALL ME - if  injection site reaction is bigger than the size of your fist, looks infected, blisters, or if you develop hives

## 2024-08-28 ENCOUNTER — Ambulatory Visit

## 2024-08-29 ENCOUNTER — Encounter: Payer: Self-pay | Admitting: *Deleted

## 2024-08-29 NOTE — Telephone Encounter (Signed)
 First injection completed 08/27/24.

## 2024-08-29 NOTE — Progress Notes (Signed)
 [name]                                          MRN: [id number]   08/29/2024   The [facility] Quality Team Specialist reviewed this patient medical record for the purposes of chart review for care gap closure. The following were reviewed: chart review for care gap closure-diabetic eye exam.    [facility] Quality Team

## 2024-08-31 ENCOUNTER — Ambulatory Visit

## 2024-09-04 ENCOUNTER — Ambulatory Visit

## 2024-09-06 ENCOUNTER — Ambulatory Visit

## 2024-09-07 ENCOUNTER — Ambulatory Visit

## 2024-09-26 ENCOUNTER — Ambulatory Visit: Admitting: Pulmonary Disease

## 2024-10-23 ENCOUNTER — Ambulatory Visit: Admitting: Pulmonary Disease

## 2024-12-26 ENCOUNTER — Ambulatory Visit: Admitting: Orthopaedic Surgery

## 2025-03-08 ENCOUNTER — Ambulatory Visit: Admitting: Physician Assistant
